# Patient Record
Sex: Female | Born: 1945
Health system: Southern US, Community
[De-identification: ages and names within clinical notes are randomized; demographics above are authoritative.]

## PROBLEM LIST (undated history)

## (undated) DIAGNOSIS — M4316 Spondylolisthesis, lumbar region: Secondary | ICD-10-CM

## (undated) DIAGNOSIS — R112 Nausea with vomiting, unspecified: Secondary | ICD-10-CM

## (undated) DIAGNOSIS — R7302 Impaired glucose tolerance (oral): Secondary | ICD-10-CM

## (undated) DIAGNOSIS — T8859XA Other complications of anesthesia, initial encounter: Secondary | ICD-10-CM

## (undated) DIAGNOSIS — D691 Qualitative platelet defects: Secondary | ICD-10-CM

## (undated) DIAGNOSIS — M1990 Unspecified osteoarthritis, unspecified site: Secondary | ICD-10-CM

## (undated) DIAGNOSIS — I10 Essential (primary) hypertension: Secondary | ICD-10-CM

## (undated) DIAGNOSIS — C9590 Leukemia, unspecified not having achieved remission: Secondary | ICD-10-CM

## (undated) DIAGNOSIS — IMO0002 Reserved for concepts with insufficient information to code with codable children: Secondary | ICD-10-CM

## (undated) DIAGNOSIS — K5730 Diverticulosis of large intestine without perforation or abscess without bleeding: Secondary | ICD-10-CM

## (undated) DIAGNOSIS — M542 Cervicalgia: Secondary | ICD-10-CM

## (undated) DIAGNOSIS — Z9889 Other specified postprocedural states: Secondary | ICD-10-CM

## (undated) DIAGNOSIS — K635 Polyp of colon: Secondary | ICD-10-CM

## (undated) DIAGNOSIS — T4145XA Adverse effect of unspecified anesthetic, initial encounter: Secondary | ICD-10-CM

## (undated) DIAGNOSIS — M545 Low back pain, unspecified: Secondary | ICD-10-CM

## (undated) DIAGNOSIS — I739 Peripheral vascular disease, unspecified: Secondary | ICD-10-CM

## (undated) HISTORY — DX: Low back pain, unspecified: M54.50

## (undated) HISTORY — DX: Diverticulosis of large intestine without perforation or abscess without bleeding: K57.30

## (undated) HISTORY — DX: Unspecified osteoarthritis, unspecified site: M19.90

## (undated) HISTORY — DX: Polyp of colon: K63.5

## (undated) HISTORY — DX: Essential (primary) hypertension: I10

## (undated) HISTORY — DX: Reserved for concepts with insufficient information to code with codable children: IMO0002

## (undated) HISTORY — DX: Impaired glucose tolerance (oral): R73.02

## (undated) HISTORY — PX: BREAST EXCISIONAL BIOPSY: SUR124

## (undated) HISTORY — DX: Peripheral vascular disease, unspecified: I73.9

## (undated) HISTORY — PX: BREAST SURGERY: SHX581

## (undated) HISTORY — DX: Low back pain: M54.5

## (undated) HISTORY — PX: BACK SURGERY: SHX140

## (undated) HISTORY — DX: Qualitative platelet defects: D69.1

---

## 1987-06-30 HISTORY — PX: ABDOMINAL HYSTERECTOMY: SHX81

## 1998-07-30 ENCOUNTER — Encounter: Payer: Self-pay | Admitting: Internal Medicine

## 1998-07-30 ENCOUNTER — Ambulatory Visit (HOSPITAL_COMMUNITY): Admission: RE | Admit: 1998-07-30 | Discharge: 1998-07-30 | Payer: Self-pay | Admitting: Internal Medicine

## 2001-01-11 ENCOUNTER — Other Ambulatory Visit: Admission: RE | Admit: 2001-01-11 | Discharge: 2001-01-11 | Payer: Self-pay | Admitting: Internal Medicine

## 2001-01-11 ENCOUNTER — Encounter (INDEPENDENT_AMBULATORY_CARE_PROVIDER_SITE_OTHER): Payer: Self-pay | Admitting: Specialist

## 2001-04-08 ENCOUNTER — Ambulatory Visit (HOSPITAL_COMMUNITY): Admission: RE | Admit: 2001-04-08 | Discharge: 2001-04-08 | Payer: Self-pay | Admitting: Internal Medicine

## 2001-04-08 ENCOUNTER — Encounter: Payer: Self-pay | Admitting: Internal Medicine

## 2001-07-29 ENCOUNTER — Encounter: Admission: RE | Admit: 2001-07-29 | Discharge: 2001-07-29 | Payer: Self-pay | Admitting: Internal Medicine

## 2001-07-29 ENCOUNTER — Encounter: Payer: Self-pay | Admitting: Internal Medicine

## 2002-07-31 ENCOUNTER — Encounter: Admission: RE | Admit: 2002-07-31 | Discharge: 2002-07-31 | Payer: Self-pay | Admitting: Internal Medicine

## 2002-07-31 ENCOUNTER — Encounter: Payer: Self-pay | Admitting: Internal Medicine

## 2004-04-27 ENCOUNTER — Encounter: Admission: RE | Admit: 2004-04-27 | Discharge: 2004-04-27 | Payer: Self-pay | Admitting: Rheumatology

## 2007-04-06 ENCOUNTER — Ambulatory Visit: Payer: Self-pay | Admitting: Internal Medicine

## 2007-04-06 ENCOUNTER — Encounter: Payer: Self-pay | Admitting: Internal Medicine

## 2007-04-06 DIAGNOSIS — M545 Low back pain, unspecified: Secondary | ICD-10-CM | POA: Insufficient documentation

## 2007-04-06 DIAGNOSIS — M1990 Unspecified osteoarthritis, unspecified site: Secondary | ICD-10-CM | POA: Insufficient documentation

## 2007-04-06 DIAGNOSIS — K5730 Diverticulosis of large intestine without perforation or abscess without bleeding: Secondary | ICD-10-CM | POA: Insufficient documentation

## 2007-04-06 DIAGNOSIS — Z86010 Personal history of colonic polyps: Secondary | ICD-10-CM | POA: Insufficient documentation

## 2007-04-06 DIAGNOSIS — I10 Essential (primary) hypertension: Secondary | ICD-10-CM | POA: Insufficient documentation

## 2007-04-06 DIAGNOSIS — F519 Sleep disorder not due to a substance or known physiological condition, unspecified: Secondary | ICD-10-CM | POA: Insufficient documentation

## 2007-04-06 DIAGNOSIS — J45909 Unspecified asthma, uncomplicated: Secondary | ICD-10-CM | POA: Insufficient documentation

## 2007-04-06 LAB — CONVERTED CEMR LAB
ALT: 16 units/L (ref 0–35)
Albumin: 4.1 g/dL (ref 3.5–5.2)
Basophils Absolute: 0 10*3/uL (ref 0.0–0.1)
Basophils Relative: 0.6 % (ref 0.0–1.0)
Bilirubin, Direct: 0.1 mg/dL (ref 0.0–0.3)
Chloride: 104 meq/L (ref 96–112)
Cholesterol: 127 mg/dL (ref 0–200)
Eosinophils Relative: 0.9 % (ref 0.0–5.0)
Glucose, Bld: 119 mg/dL — ABNORMAL HIGH (ref 70–99)
HCT: 35.6 % — ABNORMAL LOW (ref 36.0–46.0)
Hemoglobin, Urine: NEGATIVE
Hemoglobin: 12 g/dL (ref 12.0–15.0)
Monocytes Absolute: 0.4 10*3/uL (ref 0.2–0.7)
Neutrophils Relative %: 55.5 % (ref 43.0–77.0)
Platelets: 613 10*3/uL — ABNORMAL HIGH (ref 150–400)
RBC / HPF: NONE SEEN
RBC: 3.86 M/uL — ABNORMAL LOW (ref 3.87–5.11)
RDW: 11.5 % (ref 11.5–14.6)
Specific Gravity, Urine: >=1.03 (ref 1.000–1.03)
TSH: 0.55 microintl units/mL (ref 0.35–5.50)
Urine Glucose: NEGATIVE mg/dL
Urobilinogen, UA: 0.2 (ref 0.0–1.0)
VLDL: 36 mg/dL (ref 0–40)
WBC: 4.9 10*3/uL (ref 4.5–10.5)
pH: 6 (ref 5.0–8.0)

## 2007-05-10 ENCOUNTER — Ambulatory Visit: Payer: Self-pay | Admitting: Internal Medicine

## 2007-05-23 ENCOUNTER — Telehealth: Payer: Self-pay | Admitting: Internal Medicine

## 2007-05-24 ENCOUNTER — Ambulatory Visit: Payer: Self-pay | Admitting: Internal Medicine

## 2007-08-09 ENCOUNTER — Ambulatory Visit: Payer: Self-pay | Admitting: Internal Medicine

## 2007-08-09 DIAGNOSIS — IMO0002 Reserved for concepts with insufficient information to code with codable children: Secondary | ICD-10-CM | POA: Insufficient documentation

## 2007-08-09 DIAGNOSIS — J309 Allergic rhinitis, unspecified: Secondary | ICD-10-CM | POA: Insufficient documentation

## 2007-09-08 ENCOUNTER — Ambulatory Visit: Payer: Self-pay | Admitting: Endocrinology

## 2007-09-08 ENCOUNTER — Ambulatory Visit: Payer: Self-pay

## 2007-09-08 DIAGNOSIS — M79609 Pain in unspecified limb: Secondary | ICD-10-CM | POA: Insufficient documentation

## 2007-10-03 ENCOUNTER — Ambulatory Visit: Payer: Self-pay | Admitting: Internal Medicine

## 2007-10-03 ENCOUNTER — Encounter (INDEPENDENT_AMBULATORY_CARE_PROVIDER_SITE_OTHER): Payer: Self-pay | Admitting: *Deleted

## 2007-10-03 DIAGNOSIS — M25569 Pain in unspecified knee: Secondary | ICD-10-CM | POA: Insufficient documentation

## 2007-10-04 LAB — CONVERTED CEMR LAB
BUN: 11 mg/dL (ref 6–23)
Basophils Absolute: 0 10*3/uL (ref 0.0–0.1)
Basophils Relative: 0.1 % (ref 0.0–1.0)
CO2: 32 meq/L (ref 19–32)
Chloride: 108 meq/L (ref 96–112)
Creatinine, Ser: 0.8 mg/dL (ref 0.4–1.2)
Eosinophils Relative: 0.9 % (ref 0.0–5.0)
GFR calc Af Amer: 94 mL/min
GFR calc non Af Amer: 78 mL/min
HCT: 36.1 % (ref 36.0–46.0)
Hemoglobin: 11.8 g/dL — ABNORMAL LOW (ref 12.0–15.0)
Lymphocytes Relative: 37.8 % (ref 12.0–46.0)
MCHC: 32.6 g/dL (ref 30.0–36.0)
MCV: 94.5 fL (ref 78.0–100.0)
Potassium: 4 meq/L (ref 3.5–5.1)
RBC: 3.82 M/uL — ABNORMAL LOW (ref 3.87–5.11)
Sodium: 144 meq/L (ref 135–145)
TSH: 0.46 microintl units/mL (ref 0.35–5.50)

## 2007-10-21 ENCOUNTER — Encounter: Admission: RE | Admit: 2007-10-21 | Discharge: 2007-10-21 | Payer: Self-pay | Admitting: Internal Medicine

## 2007-10-25 ENCOUNTER — Encounter: Payer: Self-pay | Admitting: Internal Medicine

## 2007-11-02 ENCOUNTER — Encounter: Admission: RE | Admit: 2007-11-02 | Discharge: 2007-11-02 | Payer: Self-pay | Admitting: General Practice

## 2008-10-22 ENCOUNTER — Encounter: Admission: RE | Admit: 2008-10-22 | Discharge: 2008-10-22 | Payer: Self-pay | Admitting: Internal Medicine

## 2008-11-21 ENCOUNTER — Telehealth: Payer: Self-pay | Admitting: Internal Medicine

## 2008-11-29 ENCOUNTER — Ambulatory Visit: Payer: Self-pay | Admitting: Internal Medicine

## 2009-01-02 ENCOUNTER — Telehealth: Payer: Self-pay | Admitting: Internal Medicine

## 2009-02-05 ENCOUNTER — Telehealth: Payer: Self-pay | Admitting: Internal Medicine

## 2009-02-05 ENCOUNTER — Emergency Department (HOSPITAL_COMMUNITY): Admission: EM | Admit: 2009-02-05 | Discharge: 2009-02-05 | Payer: Self-pay | Admitting: Emergency Medicine

## 2009-06-05 ENCOUNTER — Ambulatory Visit: Payer: Self-pay | Admitting: Internal Medicine

## 2009-06-05 LAB — CONVERTED CEMR LAB
ALT: 6 units/L (ref 0–35)
Basophils Absolute: 0 10*3/uL (ref 0.0–0.1)
Bilirubin Urine: NEGATIVE
Bilirubin, Direct: 0.1 mg/dL (ref 0.0–0.3)
Cholesterol: 147 mg/dL (ref 0–200)
Creatinine, Ser: 0.9 mg/dL (ref 0.4–1.2)
Eosinophils Relative: 1.4 % (ref 0.0–5.0)
HCT: 36.1 % (ref 36.0–46.0)
HDL: 33.7 mg/dL — ABNORMAL LOW (ref >39.00)
Hemoglobin, Urine: NEGATIVE
Hemoglobin: 11.9 g/dL — ABNORMAL LOW (ref 12.0–15.0)
Ketones, ur: NEGATIVE mg/dL
Leukocytes, UA: NEGATIVE
Lymphocytes Relative: 39.8 % (ref 12.0–46.0)
Lymphs Abs: 2.3 10*3/uL (ref 0.7–4.0)
MCHC: 33 g/dL (ref 30.0–36.0)
MCV: 94.7 fL (ref 78.0–100.0)
Neutrophils Relative %: 51.6 % (ref 43.0–77.0)
RBC: 3.82 M/uL — ABNORMAL LOW (ref 3.87–5.11)
Specific Gravity, Urine: <=1.005 (ref 1.000–1.030)
TSH: 1.2 microintl units/mL (ref 0.35–5.50)
Total Protein, Urine: NEGATIVE mg/dL
Urobilinogen, UA: 0.2 (ref 0.0–1.0)
VLDL: 21.6 mg/dL (ref 0.0–40.0)
pH: 5 (ref 5.0–8.0)

## 2009-06-06 ENCOUNTER — Ambulatory Visit (HOSPITAL_COMMUNITY): Admission: RE | Admit: 2009-06-06 | Discharge: 2009-06-06 | Payer: Self-pay | Admitting: Ophthalmology

## 2009-06-06 ENCOUNTER — Ambulatory Visit: Payer: Self-pay | Admitting: Vascular Surgery

## 2009-06-06 ENCOUNTER — Encounter (INDEPENDENT_AMBULATORY_CARE_PROVIDER_SITE_OTHER): Payer: Self-pay | Admitting: Ophthalmology

## 2009-06-07 ENCOUNTER — Ambulatory Visit: Payer: Self-pay | Admitting: Internal Medicine

## 2009-06-07 DIAGNOSIS — H5310 Unspecified subjective visual disturbances: Secondary | ICD-10-CM | POA: Insufficient documentation

## 2009-06-07 DIAGNOSIS — D759 Disease of blood and blood-forming organs, unspecified: Secondary | ICD-10-CM | POA: Insufficient documentation

## 2009-06-19 ENCOUNTER — Telehealth: Payer: Self-pay | Admitting: Internal Medicine

## 2009-06-29 DIAGNOSIS — D691 Qualitative platelet defects: Secondary | ICD-10-CM

## 2009-06-29 DIAGNOSIS — I739 Peripheral vascular disease, unspecified: Secondary | ICD-10-CM

## 2009-06-29 DIAGNOSIS — IMO0002 Reserved for concepts with insufficient information to code with codable children: Secondary | ICD-10-CM

## 2009-06-29 HISTORY — DX: Peripheral vascular disease, unspecified: I73.9

## 2009-06-29 HISTORY — DX: Qualitative platelet defects: D69.1

## 2009-06-29 HISTORY — DX: Reserved for concepts with insufficient information to code with codable children: IMO0002

## 2009-07-18 ENCOUNTER — Ambulatory Visit: Payer: Self-pay | Admitting: Vascular Surgery

## 2009-07-18 ENCOUNTER — Encounter: Payer: Self-pay | Admitting: Internal Medicine

## 2009-08-05 ENCOUNTER — Ambulatory Visit: Payer: Self-pay | Admitting: Vascular Surgery

## 2009-08-05 ENCOUNTER — Ambulatory Visit (HOSPITAL_COMMUNITY): Admission: RE | Admit: 2009-08-05 | Discharge: 2009-08-05 | Payer: Self-pay | Admitting: Vascular Surgery

## 2009-08-06 ENCOUNTER — Encounter: Payer: Self-pay | Admitting: Internal Medicine

## 2009-08-14 ENCOUNTER — Ambulatory Visit: Payer: Self-pay | Admitting: Internal Medicine

## 2009-08-15 LAB — CONVERTED CEMR LAB
Basophils Absolute: 0 10*3/uL (ref 0.0–0.1)
CO2: 33 meq/L — ABNORMAL HIGH (ref 19–32)
Chloride: 105 meq/L (ref 96–112)
Eosinophils Absolute: 0.1 10*3/uL (ref 0.0–0.7)
GFR calc non Af Amer: 92.92 mL/min (ref >60)
Lymphs Abs: 1.6 10*3/uL (ref 0.7–4.0)
Monocytes Relative: 9.1 % (ref 3.0–12.0)
Neutrophils Relative %: 55.1 % (ref 43.0–77.0)
Platelets: 638 10*3/uL — ABNORMAL HIGH (ref 150.0–400.0)
RBC: 3.57 M/uL — ABNORMAL LOW (ref 3.87–5.11)
Sodium: 144 meq/L (ref 135–145)
WBC: 4.9 10*3/uL (ref 4.5–10.5)

## 2009-08-21 ENCOUNTER — Ambulatory Visit: Payer: Self-pay | Admitting: Internal Medicine

## 2009-08-21 DIAGNOSIS — I739 Peripheral vascular disease, unspecified: Secondary | ICD-10-CM | POA: Insufficient documentation

## 2009-08-21 DIAGNOSIS — H3582 Retinal ischemia: Secondary | ICD-10-CM | POA: Insufficient documentation

## 2009-08-21 DIAGNOSIS — R609 Edema, unspecified: Secondary | ICD-10-CM | POA: Insufficient documentation

## 2009-08-22 ENCOUNTER — Ambulatory Visit: Payer: Self-pay | Admitting: Oncology

## 2009-08-26 ENCOUNTER — Encounter: Payer: Self-pay | Admitting: Internal Medicine

## 2009-08-26 LAB — CBC WITH DIFFERENTIAL/PLATELET
Basophils Absolute: 0 10*3/uL (ref 0.0–0.1)
Eosinophils Absolute: 0.1 10*3/uL (ref 0.0–0.5)
HGB: 12.2 g/dL (ref 11.6–15.9)
MCH: 30.3 pg (ref 25.1–34.0)
MCHC: 33.8 g/dL (ref 31.5–36.0)
NEUT#: 2.5 10*3/uL (ref 1.5–6.5)
Platelets: 585 10*3/uL — ABNORMAL HIGH (ref 145–400)
RBC: 4.02 10*6/uL (ref 3.70–5.45)
WBC: 5.1 10*3/uL (ref 3.9–10.3)
nRBC: 0 % (ref 0–0)

## 2009-08-26 LAB — CHCC SMEAR

## 2009-08-26 LAB — MORPHOLOGY

## 2009-09-02 LAB — IRON AND TIBC
Iron: 78 ug/dL (ref 42–145)
TIBC: 346 ug/dL (ref 250–470)

## 2009-09-02 LAB — COMPREHENSIVE METABOLIC PANEL
ALT: <8 U/L (ref 0–35)
Albumin: 4.5 g/dL (ref 3.5–5.2)
BUN: 16 mg/dL (ref 6–23)
CO2: 27 mEq/L (ref 19–32)
Calcium: 9.8 mg/dL (ref 8.4–10.5)
Chloride: 100 mEq/L (ref 96–112)
Creatinine, Ser: 0.91 mg/dL (ref 0.40–1.20)
Sodium: 140 mEq/L (ref 135–145)

## 2009-09-02 LAB — SEDIMENTATION RATE: Sed Rate: 11 mm/hr (ref 0–22)

## 2009-09-02 LAB — TRANSFERRIN RECEPTOR, SOLUABLE: Transferrin Receptor, Soluble: 18.8 nmol/L

## 2009-09-02 LAB — FERRITIN: Ferritin: 224 ng/mL (ref 10–291)

## 2009-09-02 LAB — C-REACTIVE PROTEIN: CRP: 0.3 mg/dL (ref <0.6)

## 2009-09-24 ENCOUNTER — Encounter: Payer: Self-pay | Admitting: Internal Medicine

## 2009-09-24 ENCOUNTER — Ambulatory Visit: Payer: Self-pay | Admitting: Oncology

## 2009-09-24 LAB — CBC WITH DIFFERENTIAL/PLATELET
Basophils Absolute: 0 10*3/uL (ref 0.0–0.1)
EOS%: 4.7 % (ref 0.0–7.0)
Eosinophils Absolute: 0.2 10*3/uL (ref 0.0–0.5)
HCT: 36.1 % (ref 34.8–46.6)
MONO%: 7.3 % (ref 0.0–14.0)
NEUT#: 2.4 10*3/uL (ref 1.5–6.5)
NEUT%: 51.1 % (ref 38.4–76.8)
lymph#: 1.7 10*3/uL (ref 0.9–3.3)

## 2009-09-24 LAB — C-REACTIVE PROTEIN: CRP: 0.2 mg/dL (ref <0.6)

## 2009-10-04 ENCOUNTER — Ambulatory Visit (HOSPITAL_COMMUNITY): Admission: RE | Admit: 2009-10-04 | Discharge: 2009-10-04 | Payer: Self-pay | Admitting: Oncology

## 2009-10-11 LAB — CBC WITH DIFFERENTIAL/PLATELET
BASO%: 0.8 % (ref 0.0–2.0)
Basophils Absolute: 0 10e3/uL (ref 0.0–0.1)
EOS%: 4 % (ref 0.0–7.0)
Eosinophils Absolute: 0.2 10e3/uL (ref 0.0–0.5)
HCT: 35.1 % (ref 34.8–46.6)
HGB: 11.9 g/dL (ref 11.6–15.9)
LYMPH%: 37.8 % (ref 14.0–49.7)
MCH: 32 pg (ref 25.1–34.0)
MCHC: 33.9 g/dL (ref 31.5–36.0)
MCV: 94.5 fL (ref 79.5–101.0)
MONO#: 0.5 10e3/uL (ref 0.1–0.9)
MONO%: 8.8 % (ref 0.0–14.0)
NEUT#: 2.7 10e3/uL (ref 1.5–6.5)
NEUT%: 48.6 % (ref 38.4–76.8)
Platelets: 650 10e3/uL — ABNORMAL HIGH (ref 145–400)
RBC: 3.71 10e6/uL (ref 3.70–5.45)
RDW: 12.9 % (ref 11.2–14.5)
WBC: 5.5 10e3/uL (ref 3.9–10.3)
lymph#: 2.1 10e3/uL (ref 0.9–3.3)

## 2009-10-21 LAB — JAK2 EXONS 12-15

## 2009-10-23 ENCOUNTER — Encounter: Admission: RE | Admit: 2009-10-23 | Discharge: 2009-10-23 | Payer: Self-pay | Admitting: Internal Medicine

## 2009-10-23 LAB — HM MAMMOGRAPHY: HM Mammogram: NEGATIVE

## 2009-11-11 ENCOUNTER — Ambulatory Visit: Payer: Self-pay | Admitting: Oncology

## 2009-11-13 ENCOUNTER — Ambulatory Visit: Payer: Self-pay | Admitting: Oncology

## 2009-11-14 ENCOUNTER — Ambulatory Visit (HOSPITAL_COMMUNITY): Admission: RE | Admit: 2009-11-14 | Discharge: 2009-11-14 | Payer: Self-pay | Admitting: Oncology

## 2009-11-22 ENCOUNTER — Encounter: Payer: Self-pay | Admitting: Internal Medicine

## 2009-11-22 LAB — CBC WITH DIFFERENTIAL/PLATELET
BASO%: 0.4 % (ref 0.0–2.0)
Basophils Absolute: 0 10*3/uL (ref 0.0–0.1)
EOS%: 3.6 % (ref 0.0–7.0)
HGB: 11.9 g/dL (ref 11.6–15.9)
MONO%: 8.6 % (ref 0.0–14.0)
lymph#: 1.8 10*3/uL (ref 0.9–3.3)

## 2009-11-22 LAB — COMPREHENSIVE METABOLIC PANEL
ALT: <8 U/L (ref 0–35)
AST: 17 U/L (ref 0–37)
CO2: 26 mEq/L (ref 19–32)
Chloride: 102 mEq/L (ref 96–112)
Glucose, Bld: 102 mg/dL — ABNORMAL HIGH (ref 70–99)

## 2009-11-22 LAB — URIC ACID: Uric Acid, Serum: 7 mg/dL (ref 2.4–7.0)

## 2009-11-22 LAB — LACTATE DEHYDROGENASE: LDH: 165 U/L (ref 94–250)

## 2009-12-06 LAB — CBC WITH DIFFERENTIAL/PLATELET
Eosinophils Absolute: 0.2 10*3/uL (ref 0.0–0.5)
HCT: 35.9 % (ref 34.8–46.6)
HGB: 12.1 g/dL (ref 11.6–15.9)
MCHC: 33.6 g/dL (ref 31.5–36.0)
MONO%: 7.7 % (ref 0.0–14.0)
RDW: 13 % (ref 11.2–14.5)
WBC: 5.2 10*3/uL (ref 3.9–10.3)

## 2009-12-16 ENCOUNTER — Ambulatory Visit: Payer: Self-pay | Admitting: Internal Medicine

## 2009-12-16 LAB — CONVERTED CEMR LAB
Alkaline Phosphatase: 55 units/L (ref 39–117)
BUN: 14 mg/dL (ref 6–23)
Basophils Absolute: 0 10*3/uL (ref 0.0–0.1)
Basophils Relative: 0.5 % (ref 0.0–3.0)
Bilirubin, Direct: 0.1 mg/dL (ref 0.0–0.3)
Calcium: 9.7 mg/dL (ref 8.4–10.5)
Creatinine, Ser: 0.8 mg/dL (ref 0.4–1.2)
Eosinophils Absolute: 0.1 10*3/uL (ref 0.0–0.7)
Eosinophils Relative: 2.5 % (ref 0.0–5.0)
GFR calc non Af Amer: 92.82 mL/min (ref >60)
Glucose, Bld: 127 mg/dL — ABNORMAL HIGH (ref 70–99)
HCT: 34.2 % — ABNORMAL LOW (ref 36.0–46.0)
Hemoglobin: 11.8 g/dL — ABNORMAL LOW (ref 12.0–15.0)
Hgb A1c MFr Bld: 6.2 % (ref 4.6–6.5)
Lymphocytes Relative: 33.1 % (ref 12.0–46.0)
Lymphs Abs: 1.7 10*3/uL (ref 0.7–4.0)
Monocytes Relative: 8.1 % (ref 3.0–12.0)
Neutro Abs: 2.8 10*3/uL (ref 1.4–7.7)
Neutrophils Relative %: 55.8 % (ref 43.0–77.0)
Potassium: 4.5 meq/L (ref 3.5–5.1)
RDW: 13.2 % (ref 11.5–14.6)
Sodium: 141 meq/L (ref 135–145)
Total Bilirubin: 0.7 mg/dL (ref 0.3–1.2)

## 2009-12-18 ENCOUNTER — Ambulatory Visit: Payer: Self-pay | Admitting: Oncology

## 2009-12-20 ENCOUNTER — Ambulatory Visit: Payer: Self-pay | Admitting: Internal Medicine

## 2009-12-20 DIAGNOSIS — R21 Rash and other nonspecific skin eruption: Secondary | ICD-10-CM | POA: Insufficient documentation

## 2009-12-20 DIAGNOSIS — R42 Dizziness and giddiness: Secondary | ICD-10-CM | POA: Insufficient documentation

## 2009-12-20 LAB — CBC WITH DIFFERENTIAL/PLATELET
Basophils Absolute: 0 10*3/uL (ref 0.0–0.1)
EOS%: 2.2 % (ref 0.0–7.0)
Eosinophils Absolute: 0.1 10*3/uL (ref 0.0–0.5)
HCT: 34.1 % — ABNORMAL LOW (ref 34.8–46.6)
HGB: 11.6 g/dL (ref 11.6–15.9)
LYMPH%: 35 % (ref 14.0–49.7)
MCH: 32.1 pg (ref 25.1–34.0)
MCHC: 34.1 g/dL (ref 31.5–36.0)
MCV: 94 fL (ref 79.5–101.0)
MONO#: 0.4 10*3/uL (ref 0.1–0.9)
NEUT%: 54.8 % (ref 38.4–76.8)
Platelets: 639 10*3/uL — ABNORMAL HIGH (ref 145–400)
RBC: 3.62 10*6/uL — ABNORMAL LOW (ref 3.70–5.45)
RDW: 13.7 % (ref 11.2–14.5)

## 2010-01-02 ENCOUNTER — Encounter: Payer: Self-pay | Admitting: Internal Medicine

## 2010-01-02 LAB — CBC WITH DIFFERENTIAL/PLATELET
MCH: 32.9 pg (ref 25.1–34.0)
MCV: 96.6 fL (ref 79.5–101.0)
NEUT#: 2.3 10*3/uL (ref 1.5–6.5)
NEUT%: 50 % (ref 38.4–76.8)
Platelets: 614 10*3/uL — ABNORMAL HIGH (ref 145–400)
RBC: 3.58 10*6/uL — ABNORMAL LOW (ref 3.70–5.45)
WBC: 4.5 10*3/uL (ref 3.9–10.3)
lymph#: 1.8 10*3/uL (ref 0.9–3.3)

## 2010-01-02 LAB — LACTATE DEHYDROGENASE: LDH: 163 U/L (ref 94–250)

## 2010-01-02 LAB — COMPREHENSIVE METABOLIC PANEL
AST: 17 U/L (ref 0–37)
Albumin: 4.5 g/dL (ref 3.5–5.2)
BUN: 12 mg/dL (ref 6–23)
Chloride: 102 mEq/L (ref 96–112)
Glucose, Bld: 117 mg/dL — ABNORMAL HIGH (ref 70–99)
Potassium: 4.7 mEq/L (ref 3.5–5.3)

## 2010-01-16 LAB — CBC WITH DIFFERENTIAL/PLATELET
EOS%: 1.5 % (ref 0.0–7.0)
Eosinophils Absolute: 0.1 10*3/uL (ref 0.0–0.5)
HCT: 33 % — ABNORMAL LOW (ref 34.8–46.6)
HGB: 11.3 g/dL — ABNORMAL LOW (ref 11.6–15.9)
LYMPH%: 39.4 % (ref 14.0–49.7)
MCHC: 34.2 g/dL (ref 31.5–36.0)
MONO#: 0.6 10*3/uL (ref 0.1–0.9)
RDW: 16.2 % — ABNORMAL HIGH (ref 11.2–14.5)
lymph#: 1.8 10*3/uL (ref 0.9–3.3)
nRBC: 0 % (ref 0–0)

## 2010-01-28 ENCOUNTER — Ambulatory Visit: Payer: Self-pay | Admitting: Oncology

## 2010-01-30 LAB — CBC WITH DIFFERENTIAL/PLATELET
BASO%: 0.7 % (ref 0.0–2.0)
EOS%: 1.1 % (ref 0.0–7.0)
HCT: 32.8 % — ABNORMAL LOW (ref 34.8–46.6)
HGB: 11.2 g/dL — ABNORMAL LOW (ref 11.6–15.9)
MCH: 33.3 pg (ref 25.1–34.0)
MCHC: 34.1 g/dL (ref 31.5–36.0)
MONO#: 0.4 10*3/uL (ref 0.1–0.9)
RBC: 3.36 10*6/uL — ABNORMAL LOW (ref 3.70–5.45)
RDW: 17 % — ABNORMAL HIGH (ref 11.2–14.5)
WBC: 4.5 10*3/uL (ref 3.9–10.3)

## 2010-02-13 LAB — CBC WITH DIFFERENTIAL/PLATELET
BASO%: 0.5 % (ref 0.0–2.0)
EOS%: 1 % (ref 0.0–7.0)
Eosinophils Absolute: 0 10*3/uL (ref 0.0–0.5)
HCT: 32 % — ABNORMAL LOW (ref 34.8–46.6)
LYMPH%: 30.9 % (ref 14.0–49.7)
MCV: 103.8 fL — ABNORMAL HIGH (ref 79.5–101.0)
MONO#: 0.4 10*3/uL (ref 0.1–0.9)
NEUT#: 2.3 10*3/uL (ref 1.5–6.5)
WBC: 3.9 10*3/uL (ref 3.9–10.3)

## 2010-02-27 ENCOUNTER — Ambulatory Visit: Payer: Self-pay | Admitting: Oncology

## 2010-02-27 ENCOUNTER — Encounter: Payer: Self-pay | Admitting: Internal Medicine

## 2010-02-27 LAB — LACTATE DEHYDROGENASE: LDH: 144 U/L (ref 94–250)

## 2010-02-27 LAB — CBC WITH DIFFERENTIAL/PLATELET
BASO%: 0.4 % (ref 0.0–2.0)
LYMPH%: 39.4 % (ref 14.0–49.7)
MCH: 36.3 pg — ABNORMAL HIGH (ref 25.1–34.0)
MCV: 106.8 fL — ABNORMAL HIGH (ref 79.5–101.0)
MONO#: 0.3 10*3/uL (ref 0.1–0.9)
MONO%: 8.2 % (ref 0.0–14.0)
NEUT#: 2.1 10*3/uL (ref 1.5–6.5)
Platelets: 443 10*3/uL — ABNORMAL HIGH (ref 145–400)
RBC: 3.09 10*6/uL — ABNORMAL LOW (ref 3.70–5.45)

## 2010-02-27 LAB — COMPREHENSIVE METABOLIC PANEL
ALT: 8 U/L (ref 0–35)
Alkaline Phosphatase: 48 U/L (ref 39–117)
BUN: 11 mg/dL (ref 6–23)
Calcium: 9.2 mg/dL (ref 8.4–10.5)
Chloride: 100 mEq/L (ref 96–112)
Creatinine, Ser: 0.81 mg/dL (ref 0.40–1.20)
Glucose, Bld: 114 mg/dL — ABNORMAL HIGH (ref 70–99)
Potassium: 3.3 mEq/L — ABNORMAL LOW (ref 3.5–5.3)
Sodium: 137 mEq/L (ref 135–145)

## 2010-03-20 LAB — CBC WITH DIFFERENTIAL/PLATELET
BASO%: 0.2 % (ref 0.0–2.0)
HCT: 32 % — ABNORMAL LOW (ref 34.8–46.6)
LYMPH%: 35.7 % (ref 14.0–49.7)
MCV: 111.9 fL — ABNORMAL HIGH (ref 79.5–101.0)
MONO#: 0.5 10*3/uL (ref 0.1–0.9)
MONO%: 10 % (ref 0.0–14.0)
NEUT#: 2.5 10*3/uL (ref 1.5–6.5)
NEUT%: 53.4 % (ref 38.4–76.8)
RDW: 16.7 % — ABNORMAL HIGH (ref 11.2–14.5)
WBC: 4.7 10*3/uL (ref 3.9–10.3)

## 2010-04-08 ENCOUNTER — Ambulatory Visit: Payer: Self-pay | Admitting: Oncology

## 2010-04-09 ENCOUNTER — Ambulatory Visit: Payer: Self-pay | Admitting: Internal Medicine

## 2010-04-10 LAB — CBC WITH DIFFERENTIAL/PLATELET
EOS%: 1.2 % (ref 0.0–7.0)
LYMPH%: 37.7 % (ref 14.0–49.7)
MCV: 106.9 fL — ABNORMAL HIGH (ref 79.5–101.0)
MONO#: 0.4 10*3/uL (ref 0.1–0.9)
NEUT#: 2.2 10*3/uL (ref 1.5–6.5)
NEUT%: 51.5 % (ref 38.4–76.8)
RDW: 14.1 % (ref 11.2–14.5)
WBC: 4.3 10*3/uL (ref 3.9–10.3)
lymph#: 1.6 10*3/uL (ref 0.9–3.3)

## 2010-05-01 LAB — CBC WITH DIFFERENTIAL/PLATELET
Basophils Absolute: 0 10*3/uL (ref 0.0–0.1)
Eosinophils Absolute: 0 10*3/uL (ref 0.0–0.5)
HGB: 11.3 g/dL — ABNORMAL LOW (ref 11.6–15.9)
MCV: 114.7 fL — ABNORMAL HIGH (ref 79.5–101.0)
NEUT#: 2.5 10*3/uL (ref 1.5–6.5)
RDW: 13.1 % (ref 11.2–14.5)
lymph#: 1.8 10*3/uL (ref 0.9–3.3)

## 2010-05-21 ENCOUNTER — Ambulatory Visit: Payer: Self-pay | Admitting: Oncology

## 2010-05-26 ENCOUNTER — Encounter: Payer: Self-pay | Admitting: Internal Medicine

## 2010-05-26 LAB — COMPREHENSIVE METABOLIC PANEL
CO2: 28 mEq/L (ref 19–32)
Creatinine, Ser: 0.87 mg/dL (ref 0.40–1.20)
Glucose, Bld: 114 mg/dL — ABNORMAL HIGH (ref 70–99)
Total Bilirubin: 0.2 mg/dL — ABNORMAL LOW (ref 0.3–1.2)
Total Protein: 7.4 g/dL (ref 6.0–8.3)

## 2010-05-26 LAB — CBC WITH DIFFERENTIAL/PLATELET
Basophils Absolute: 0 10*3/uL (ref 0.0–0.1)
Eosinophils Absolute: 0.1 10*3/uL (ref 0.0–0.5)
HCT: 32.3 % — ABNORMAL LOW (ref 34.8–46.6)
HGB: 11.3 g/dL — ABNORMAL LOW (ref 11.6–15.9)
LYMPH%: 43.1 % (ref 14.0–49.7)
MCV: 115.5 fL — ABNORMAL HIGH (ref 79.5–101.0)
MONO%: 9.4 % (ref 0.0–14.0)
NEUT#: 2.1 10*3/uL (ref 1.5–6.5)
Platelets: 424 10*3/uL — ABNORMAL HIGH (ref 145–400)

## 2010-06-18 ENCOUNTER — Ambulatory Visit: Payer: Self-pay | Admitting: Vascular Surgery

## 2010-06-24 ENCOUNTER — Ambulatory Visit: Payer: Self-pay | Admitting: Oncology

## 2010-06-24 LAB — CBC WITH DIFFERENTIAL/PLATELET
Basophils Absolute: 0 10*3/uL (ref 0.0–0.1)
EOS%: 1 % (ref 0.0–7.0)
HGB: 12.2 g/dL (ref 11.6–15.9)
MCH: 41 pg — ABNORMAL HIGH (ref 25.1–34.0)
NEUT#: 2.1 10*3/uL (ref 1.5–6.5)
RDW: 13.6 % (ref 11.2–14.5)
WBC: 4.9 10*3/uL (ref 3.9–10.3)
lymph#: 2.4 10*3/uL (ref 0.9–3.3)

## 2010-06-26 IMAGING — US US ABDOMEN LIMITED
1 series · 8 of 8 positions shown · non-contrast
Comparison: None.

CLINICAL DATA: Thrombocytopenia evaluate for splenic enlargement

LIMITED ABDOMINAL ULTRASOUND - RIGHT UPPER QUADRANT

[Series 1: us abdomen limited · 0.19mm/px · 8 of 8 slices shown]
[im 1/8]
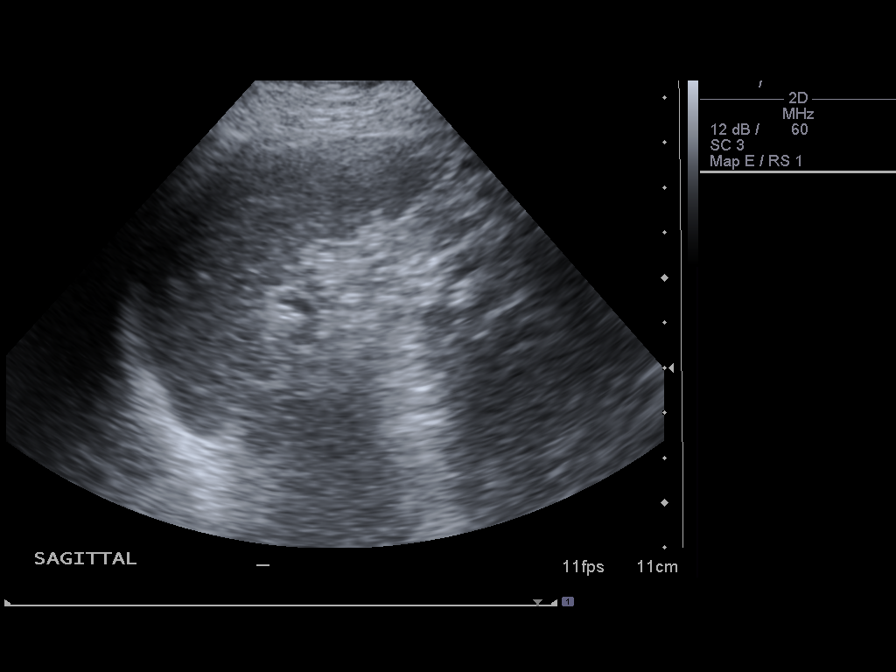
[im 2/8]
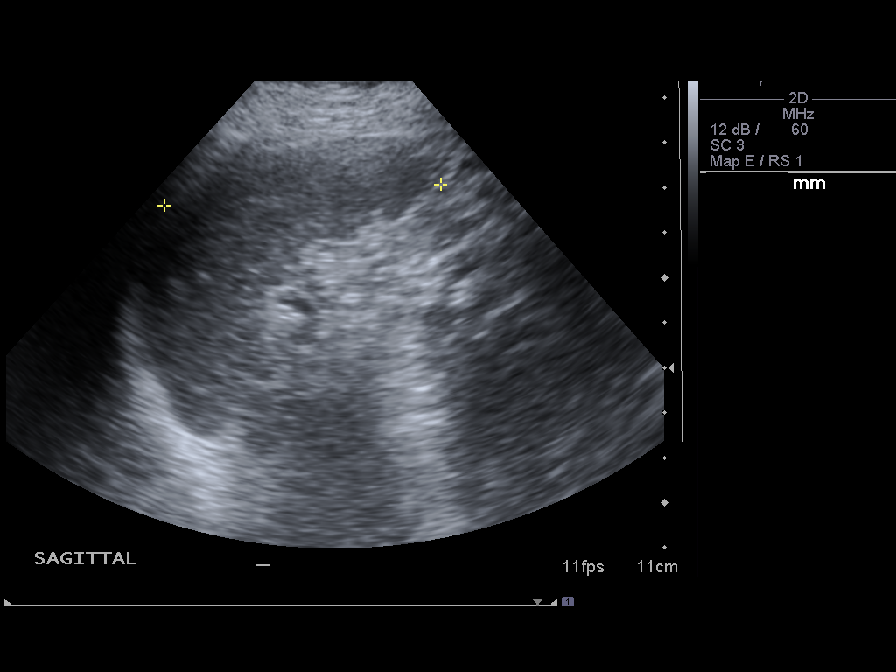
[im 3/8]
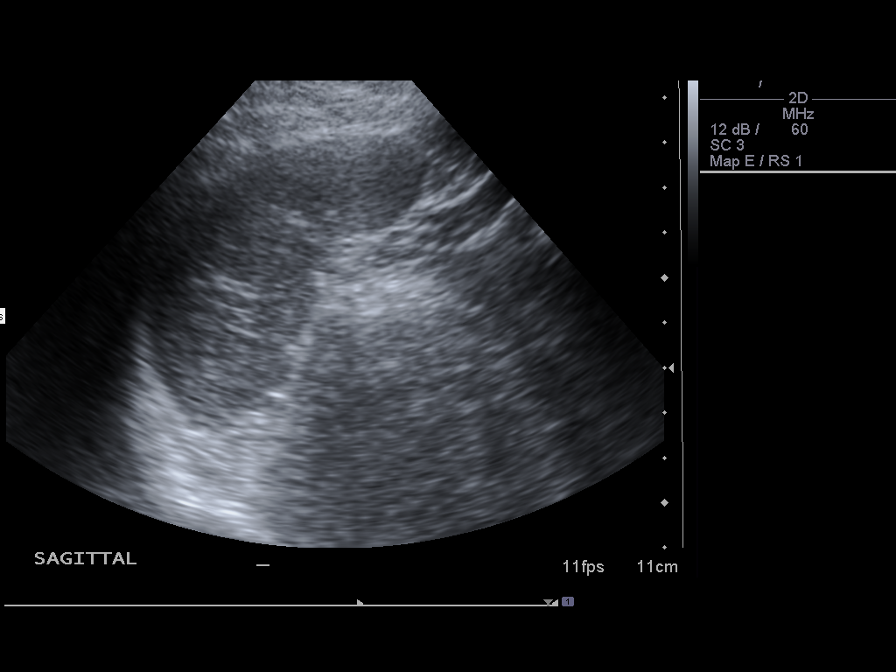
[im 4/8]
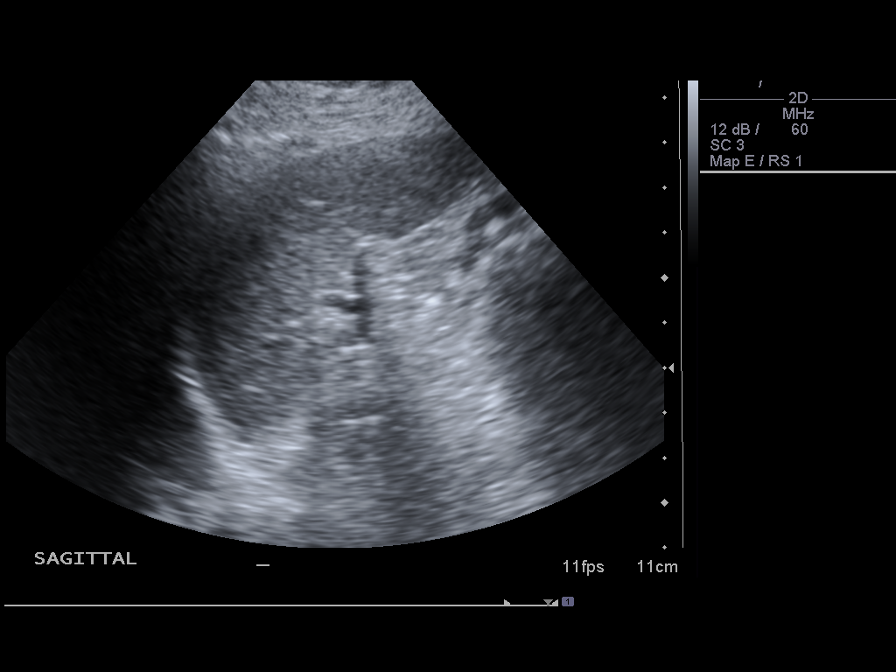
[im 5/8]
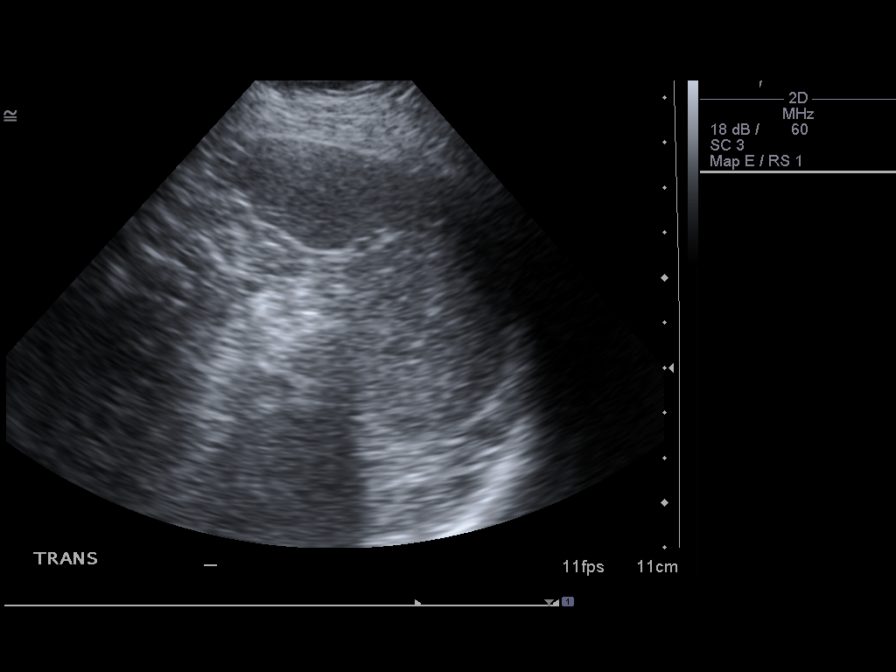
[im 6/8]
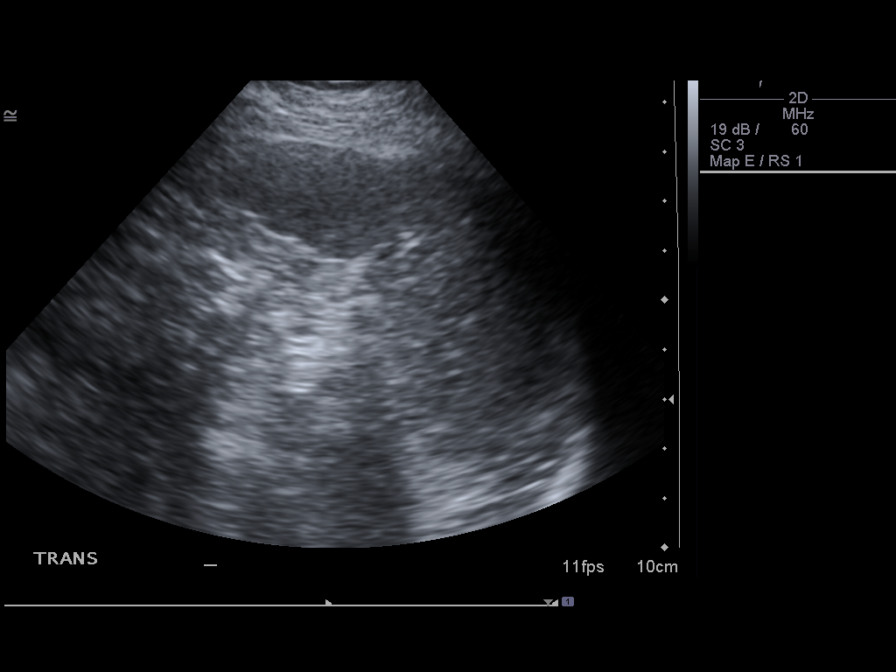
[im 7/8]
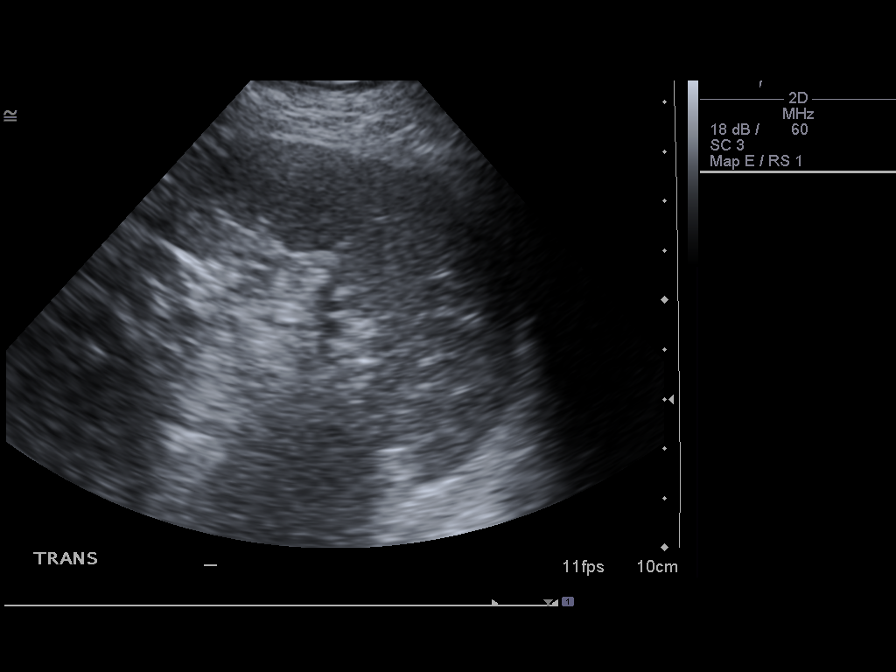
[im 8/8]
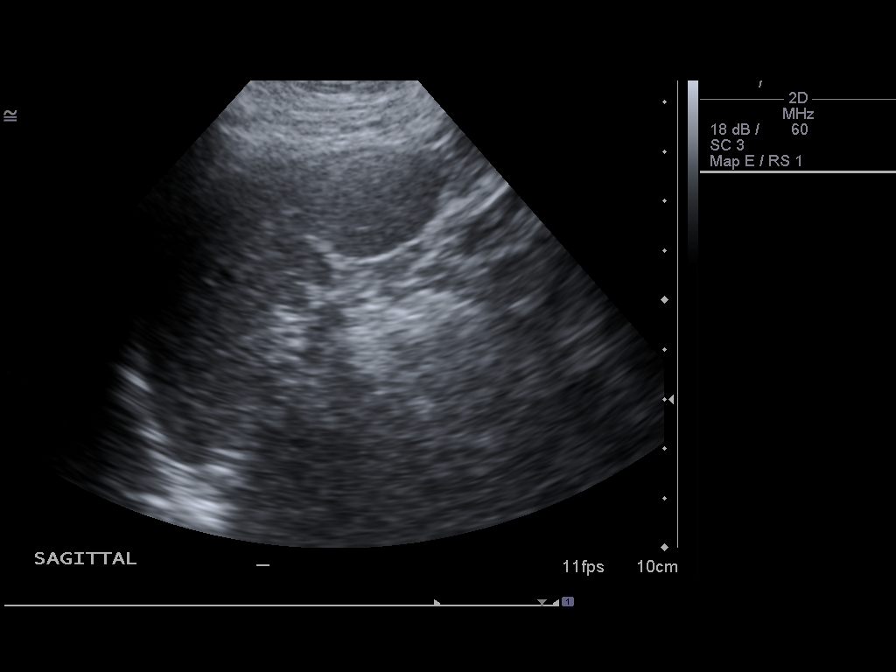

[8 of 8 positions shown; findings below may reference images not displayed]

FINDINGS: Splenic size is within normal limits.  Length of 6.2 cm.  Normal
echogenicity.  No focal splenic abnormality.  No perisplenic free
fluid or ascites.
IMPRESSION: Normal splenic size.

## 2010-07-08 LAB — CBC WITH DIFFERENTIAL/PLATELET
BASO%: 0.4 % (ref 0.0–2.0)
Basophils Absolute: 0 10*3/uL (ref 0.0–0.1)
EOS%: 1.2 % (ref 0.0–7.0)
Eosinophils Absolute: 0.1 10*3/uL (ref 0.0–0.5)
HCT: 30.9 % — ABNORMAL LOW (ref 34.8–46.6)
HGB: 10.6 g/dL — ABNORMAL LOW (ref 11.6–15.9)
LYMPH%: 37.3 % (ref 14.0–49.7)
MCH: 40.5 pg — ABNORMAL HIGH (ref 25.1–34.0)
MCHC: 34.2 g/dL (ref 31.5–36.0)
MCV: 118.5 fL — ABNORMAL HIGH (ref 79.5–101.0)
MONO#: 0.5 10*3/uL (ref 0.1–0.9)
MONO%: 11.3 % (ref 0.0–14.0)
NEUT#: 2.4 10*3/uL (ref 1.5–6.5)
NEUT%: 49.8 % (ref 38.4–76.8)
Platelets: 361 10*3/uL (ref 145–400)
RBC: 2.6 10*6/uL — ABNORMAL LOW (ref 3.70–5.45)
RDW: 14.4 % (ref 11.2–14.5)
WBC: 4.7 10*3/uL (ref 3.9–10.3)
lymph#: 1.8 10*3/uL (ref 0.9–3.3)

## 2010-07-29 NOTE — Letter (Signed)
Summary: [facility]   Imported By: Lester [place] 08/06/2009 10:06:58  _____________________________________________________________________  External Attachment:    Type:   Image     Comment:   External Document

## 2010-07-29 NOTE — Assessment & Plan Note (Signed)
Summary: 4 mo rov /nws  #   Vital Signs:  Patient profile:   65 year old female Height:      64 inches Weight:      157 pounds BMI:     27.05 O2 Sat:      97 % on Room air Temp:     98.0 degrees F oral Pulse rate:   99 / minute Resp:     16 per minute BP sitting:   142 / 80  (left arm) Cuff size:   regular  Vitals Entered By: Lanier Prude, CMA(AAMA) (December 20, 2009 10:17 AM)  O2 Flow:  Room air CC: 4 mo f/u  Comments pt c/o lightheadedness/dizziness x 1 wk   CC:  4 mo f/u .  History of Present Illness: C/o nausea and dizziness from turning head or getting up x 1.5 wk.  Current Medications (verified): 1)  Symbicort 80-4.5 Mcg/act  Aero (Budesonide-Formoterol Fumarate) .... 2 Inh Bid 2)  Vitamin D3 1000 Unit  Tabs (Cholecalciferol) .Marland Kitchen.. 1 By Mouth Daily 3)  Loratadine 10 Mg  Tabs (Loratadine) .... Once Daily As Needed Allergies Need Follow-Up Appt For Addtional Refills 4)  Benazepril-Hydrochlorothiazide 20-25 Mg  Tabs (Benazepril-Hydrochlorothiazide) .Marland Kitchen.. 1 Tab By Mouth Daily 5)  Proair Hfa 108 (90 Base) Mcg/act  Aers (Albuterol Sulfate) .... 2 Inh Q4h As Needed Shortness of Breath 6)  Aspirin 325 Mg Tabs (Aspirin) .Marland Kitchen.. 1 By Mouth Once Daily Pc 7)  Furosemide 40 Mg Tabs (Furosemide) .Marland Kitchen.. 1 By Mouth Q Am ( A Water Pill) As Needed Swelling  Allergies (verified): 1)  ! Codeine  Past History:  Family History: Last updated: 08/09/2007 Family History of CAD Female 1st degree relative <60 Family History Lung cancer mother with pancreatic cancer  Social History: Last updated: 10/03/2007 Retired, but working Single Never Smoked Alcohol use-no  Past Medical History: Colonic polyps, hx of Diverticulosis, colon Hypertension Low back pain Osteoarthritis Asthma Allergic rhinitis glucose intolerance Peripheral vascular disease R carotid Dr Edilia Bo 2011 R retinal infarct 2011 Thrombocytosis 2011 Dr Arline Asp  Review of Systems  The patient denies vision loss,  syncope, dyspnea on exertion, abdominal pain, melena, and hematochezia.    Physical Exam  General:  Well-developed,well-nourished,in no acute distress; alert,appropriate and cooperative throughout examination Eyes:  Grossly norma lvision grossly intact, pupils equal, and pupils round.   Ears:  External ear exam shows no significant lesions or deformities.  Otoscopic examination reveals clear canals, tympanic membranes are intact bilaterally without bulging, retraction, inflammation or discharge. Hearing is grossly normal bilaterally. Nose:  External nasal examination shows no deformity or inflammation. Nasal mucosa are pink and moist without lesions or exudates. Mouth:  Oral mucosa and oropharynx without lesions or exudates.  Teeth in good repair. Lungs:  Normal respiratory effort, chest expands symmetrically. Lungs are clear to auscultation, no crackles or wheezes. Heart:  Normal rate and regular rhythm. S1 and S2 normal without gallop, murmur, click, rub or other extra sounds. Abdomen:  Bowel sounds positive,abdomen soft and non-tender without masses, organomegaly or hernias noted. Msk:  No deformity or scoliosis noted of thoracic or lumbar spine.    Neurologic:  H-P (+) on R Skin:  eryth papular rash on arms Psych:  Cognition and judgment appear intact. Alert and cooperative with normal attention span and concentration. No apparent delusions, illusions, hallucinations   Impression & Recommendations:  Problem # 1:  VERTIGO (ICD-780.4) due to BPV Assessment New  Her updated medication list for this problem includes:  Loratadine 10 Mg Tabs (Loratadine) ..... Once daily as needed allergies need follow-up appt for addtional refills    Meclizine Hcl 12.5 Mg Tabs (Meclizine hcl) .Marland Kitchen... 1-2 by mouth qid as needed dizziness Francee Piccolo - Daroff exercise was given to the patient Sick leave 6/21-7/29  Problem # 2:  SKIN RASH (ICD-782.1) sun sensitivity dermatitis Assessment: New  Her updated  medication list for this problem includes:    Triamcinolone Acetonide 0.5 % Crea (Triamcinolone acetonide) ..... Use two times a day prn She does not want to change BP meds  Problem # 3:  THROMBOCYTOSIS (ICD-289.9) Assessment: Comment Only The labs and Hem notes were reviewed with the patient.   Problem # 4:  EDEMA (ICD-782.3) Assessment: Improved  Her updated medication list for this problem includes:    Benazepril-hydrochlorothiazide 20-25 Mg Tabs (Benazepril-hydrochlorothiazide) .Marland Kitchen... 1 tab by mouth daily    Furosemide 40 Mg Tabs (Furosemide) .Marland Kitchen... 1 by mouth q am ( a water pill) as needed swelling  Complete Medication List: 1)  Symbicort 80-4.5 Mcg/act Aero (Budesonide-formoterol fumarate) .... 2 inh bid 2)  Vitamin D3 1000 Unit Tabs (Cholecalciferol) .Marland Kitchen.. 1 by mouth daily 3)  Loratadine 10 Mg Tabs (Loratadine) .... Once daily as needed allergies need follow-up appt for addtional refills 4)  Benazepril-hydrochlorothiazide 20-25 Mg Tabs (Benazepril-hydrochlorothiazide) .Marland Kitchen.. 1 tab by mouth daily 5)  Proair Hfa 108 (90 Base) Mcg/act Aers (Albuterol sulfate) .... 2 inh q4h as needed shortness of breath 6)  Aspirin 325 Mg Tabs (Aspirin) .Marland Kitchen.. 1 by mouth once daily pc 7)  Furosemide 40 Mg Tabs (Furosemide) .Marland Kitchen.. 1 by mouth q am ( a water pill) as needed swelling 8)  Hydroxyurea 500 Mg Caps (Hydroxyurea) .Marland Kitchen.. 1 once daily 9)  Triamcinolone Acetonide 0.5 % Crea (Triamcinolone acetonide) .... Use two times a day prn 10)  Meclizine Hcl 12.5 Mg Tabs (Meclizine hcl) .Marland Kitchen.. 1-2 by mouth qid as needed dizziness  Patient Instructions: 1)  Francee Piccolo - Daroff exercise was given to you - do daily please 2)  Please schedule a follow-up appointment in 3 months. Prescriptions: MECLIZINE HCL 12.5 MG TABS (MECLIZINE HCL) 1-2 by mouth qid as needed dizziness  #60 x 1   Entered and Authorized by:   Tresa Garter MD   Signed by:   Tresa Garter MD on 12/20/2009   Method used:   Electronically to         [facility] (retail)       [address]       [place], [place]  [address]       Ph: [phone]       Fax: [phone]   RxID:   [id number] TRIAMCINOLONE ACETONIDE 0.5 % CREA (TRIAMCINOLONE ACETONIDE) use two times a day prn  #120 g x 3   Entered and Authorized by:   Tresa Garter MD   Signed by:   Tresa Garter MD on 12/20/2009   Method used:   Electronically to        [facility] (retail)       [address]       [place], [place]  [address]       Ph: [phone]       Fax: [phone]   RxID:   [id number]

## 2010-07-29 NOTE — Assessment & Plan Note (Signed)
Summary: 2 mos f/u / #/ cd   Vital Signs:  Patient profile:   65 year old female Weight:      157 pounds Temp:     98.6 degrees F oral Pulse rate:   76 / minute BP sitting:   134 / 64  (left arm)  Vitals Entered By: Tora Perches (August 21, 2009 10:05 AM) CC: f/u Is Patient Diabetic? No   CC:  f/u.  History of Present Illness: The patient presents for a follow up of hypertension, PVD, retinal  infarction, hyperlipidemia   Preventive Screening-Counseling & Management  Alcohol-Tobacco     Smoking Status: never  Current Medications (verified): 1)  Symbicort 80-4.5 Mcg/act  Aero (Budesonide-Formoterol Fumarate) .... 2 Inh Bid 2)  Vitamin D3 1000 Unit  Tabs (Cholecalciferol) .Marland Kitchen.. 1 By Mouth Daily 3)  Loratadine 10 Mg  Tabs (Loratadine) .... Once Daily As Needed Allergies Need Follow-Up Appt For Addtional Refills 4)  Benazepril-Hydrochlorothiazide 20-25 Mg  Tabs (Benazepril-Hydrochlorothiazide) .Marland Kitchen.. 1 Tab By Mouth Daily 5)  Proair Hfa 108 (90 Base) Mcg/act  Aers (Albuterol Sulfate) .... 2 Inh Q4h As Needed Shortness of Breath 6)  Aspirin 325 Mg Tabs (Aspirin) .Marland Kitchen.. 1 By Mouth Once Daily Pc  Allergies: 1)  ! Codeine  Past History:  Past Surgical History: Last updated: 08/09/2007 Denies surgical history  Social History: Last updated: 10/03/2007 Retired, but working Single Never Smoked Alcohol use-no  Past Medical History: Colonic polyps, hx of Diverticulosis, colon Hypertension Low back pain Osteoarthritis Asthma Allergic rhinitis glucose intolerance Peripheral vascular disease R carotid Dr Edilia Bo 2011 R retinal infarct 2011  Review of Systems  The patient denies fever, abdominal pain, melena, weight loss, weight gain, chest pain, prolonged cough, hematochezia, and severe indigestion/heartburn.    Physical Exam  General:  Well-developed,well-nourished,in no acute distress; alert,appropriate and cooperative throughout examination Eyes:  Grossly  norma lvision grossly intact, pupils equal, and pupils round.   Nose:  External nasal examination shows no deformity or inflammation. Nasal mucosa are pink and moist without lesions or exudates. Mouth:  Oral mucosa and oropharynx without lesions or exudates.  Teeth in good repair. Lungs:  Normal respiratory effort, chest expands symmetrically. Lungs are clear to auscultation, no crackles or wheezes. Heart:  Normal rate and regular rhythm. S1 and S2 normal without gallop, murmur, click, rub or other extra sounds. Abdomen:  Bowel sounds positive,abdomen soft and non-tender without masses, organomegaly or hernias noted. Msk:  No deformity or scoliosis noted of thoracic or lumbar spine.    Extremities:  trace left pedal edema and trace right pedal edema.   Neurologic:  No cranial nerve deficits noted. Station and gait are normal. Plantar reflexes are down-going bilaterally. DTRs are symmetrical throughout. Sensory, motor and coordinative functions appear intact. Skin:  Intact without suspicious lesions or rashes Psych:  Cognition and judgment appear intact. Alert and cooperative with normal attention span and concentration. No apparent delusions, illusions, hallucinations   Impression & Recommendations:  Problem # 1:  PERIPHERAL VASCULAR DISEASE (ICD-443.9) Assessment Unchanged On prescription therapy  S/p angio  Problem # 2:  THROMBOCYTOSIS (ICD-289.9) Assessment: Unchanged Cont w/ASA Orders: Hematology Referral (Hematology) Dr Arline Asp  Problem # 3:  RETINAL ISCHEMIA (ICD-362.84) Assessment: Unchanged  On ASA  Orders: Hematology Referral (Hematology)  Problem # 4:  GLUCOSE INTOLERANCE (ICD-271.3) Assessment: Unchanged The labs were reviewed with the patient.   Problem # 5:  HYPERTENSION (ICD-401.9) Assessment: Unchanged  Her updated medication list for this problem includes:    Benazepril-hydrochlorothiazide 20-25  Mg Tabs (Benazepril-hydrochlorothiazide) .Marland Kitchen... 1 tab by mouth  daily    Furosemide 40 Mg Tabs (Furosemide) .Marland Kitchen... 1 by mouth q am ( a water pill) as needed swelling  BP today: 134/64 Prior BP: 126/70 (06/07/2009)  Labs Reviewed: K+: 4.0 (08/14/2009) Creat: : 0.8 (08/14/2009)   Chol: 147 (06/05/2009)   HDL: 33.70 (06/05/2009)   LDL: 92 (06/05/2009)   TG: 108.0 (06/05/2009)  Problem # 6:  LOW BACK PAIN (ICD-724.2) Assessment: Improved  Her updated medication list for this problem includes:    Aspirin 325 Mg Tabs (Aspirin) .Marland Kitchen... 1 by mouth once daily pc  Problem # 7:  EDEMA (ICD-782.3) rare Assessment: Comment Only     Furosemide 40 Mg Tabs (Furosemide) .Marland Kitchen... 1 by mouth q am ( a water pill) as needed swelling  Complete Medication List: 1)  Symbicort 80-4.5 Mcg/act Aero (Budesonide-formoterol fumarate) .... 2 inh bid 2)  Vitamin D3 1000 Unit Tabs (Cholecalciferol) .Marland Kitchen.. 1 by mouth daily 3)  Loratadine 10 Mg Tabs (Loratadine) .... Once daily as needed allergies need follow-up appt for addtional refills 4)  Benazepril-hydrochlorothiazide 20-25 Mg Tabs (Benazepril-hydrochlorothiazide) .Marland Kitchen.. 1 tab by mouth daily 5)  Proair Hfa 108 (90 Base) Mcg/act Aers (Albuterol sulfate) .... 2 inh q4h as needed shortness of breath 6)  Aspirin 325 Mg Tabs (Aspirin) .Marland Kitchen.. 1 by mouth once daily pc 7)  Furosemide 40 Mg Tabs (Furosemide) .Marland Kitchen.. 1 by mouth q am ( a water pill) as needed swelling  Patient Instructions: 1)  Please schedule a follow-up appointment in 4 months. 2)  BMP prior to visit, ICD-9: 3)  Hepatic Panel prior to visit, ICD-9: 4)  CBC w/ Diff prior to visit, ICD-9: 5)  HbgA1C prior to visit, ICD-9:995.20  790.29 Prescriptions: FUROSEMIDE 40 MG TABS (FUROSEMIDE) 1 by mouth q am ( a water pill) as needed swelling  #30 x 3   Entered and Authorized by:   Tresa Garter MD   Signed by:   Tresa Garter MD on 08/21/2009   Method used:   Electronically to        [facility] (retail)       [address]       [place], [place]  [address]       Ph: [phone]       Fax: [phone]   RxID:   [id number]

## 2010-07-29 NOTE — Letter (Signed)
[facility]   Im[facility] By: Lennie Odor 09/26/2009 16:35:52  _____________________________________________________________________  External Attachment:    Type:   Image     Comment:   External Document

## 2010-07-29 NOTE — Letter (Signed)
[facility]   Imported By: Sherian Rein 01/01/2010 09:39:39  _____________________________________________________________________  External Attachment:    Type:   Image     Comment:   External Document

## 2010-07-29 NOTE — Letter (Signed)
Summary: [facility]  [facility]   Imported By: Lester [place] 03/14/2010 13:46:02  _____________________________________________________________________  External Attachment:    Type:   Image     Comment:   External Document

## 2010-07-29 NOTE — Letter (Signed)
[facility]   Imported By: Sherian Rein 10/23/2009 07:47:05  _____________________________________________________________________  External Attachment:    Type:   Image     Comment:   External Document

## 2010-07-29 NOTE — Letter (Signed)
[facility]   Imported By: Lester [place] 01/16/2010 08:08:26  _____________________________________________________________________  External Attachment:    Type:   Image     Comment:   External Document

## 2010-07-29 NOTE — Assessment & Plan Note (Signed)
Summary: 3 MO ROV /NWS   Vital Signs:  Patient profile:   65 year old female Height:      64 inches Weight:      163 pounds BMI:     28.08 O2 Sat:      97 % on Room air Temp:     98.1 degrees F oral Pulse rate:   84 / minute BP sitting:   140 / 70  (left arm) Cuff size:   regular  Vitals Entered By: Alysia Penna (April 09, 2010 3:44 PM)  O2 Flow:  Room air CC: pt here for follow up visit. /cp sma   CC:  pt here for follow up visit. /cp sma.  History of Present Illness: The patient presents for a follow up of hypertension, PVD, asthma, hyperlipidemia   Current Medications (verified): 1)  Symbicort 80-4.5 Mcg/act  Aero (Budesonide-Formoterol Fumarate) .... 2 Inh Bid 2)  Vitamin D3 1000 Unit  Tabs (Cholecalciferol) .Marland Kitchen.. 1 By Mouth Daily 3)  Loratadine 10 Mg  Tabs (Loratadine) .... Once Daily As Needed Allergies Need Follow-Up Appt For Addtional Refills 4)  Benazepril-Hydrochlorothiazide 20-25 Mg  Tabs (Benazepril-Hydrochlorothiazide) .Marland Kitchen.. 1 Tab By Mouth Daily 5)  Proair Hfa 108 (90 Base) Mcg/act  Aers (Albuterol Sulfate) .... 2 Inh Q4h As Needed Shortness of Breath 6)  Aspirin 325 Mg Tabs (Aspirin) .Marland Kitchen.. 1 By Mouth Once Daily Pc 7)  Furosemide 40 Mg Tabs (Furosemide) .Marland Kitchen.. 1 By Mouth Q Am ( A Water Pill) As Needed Swelling 8)  Hydroxyurea 500 Mg Caps (Hydroxyurea) .Marland Kitchen.. 1 Once Daily 9)  Triamcinolone Acetonide 0.5 % Crea (Triamcinolone Acetonide) .... Use Two Times A Day Prn 10)  Meclizine Hcl 12.5 Mg Tabs (Meclizine Hcl) .Marland Kitchen.. 1-2 By Mouth Qid As Needed Dizziness  Allergies (verified): 1)  ! Codeine  Past History:  Social History: Last updated: 10/03/2007 Retired, but working Single Never Smoked Alcohol use-no  Past Medical History: Colonic polyps, hx of Diverticulosis, colon Hypertension Low back pain Osteoarthritis Asthma Allergic rhinitis glucose intolerance Peripheral vascular disease R carotid Dr Edilia Bo 2011 R retinal infarct  2011embolic Thrombocytosis 2011 Dr Arline Asp  Review of Systems  The patient denies fever, dyspnea on exertion, and abdominal pain.    Physical Exam  General:  Well-developed,well-nourished,in no acute distress; alert,appropriate and cooperative throughout examination Nose:  External nasal examination shows no deformity or inflammation. Nasal mucosa are pink and moist without lesions or exudates. Mouth:  Oral mucosa and oropharynx without lesions or exudates.  Teeth in good repair. Lungs:  Normal respiratory effort, chest expands symmetrically. Lungs are clear to auscultation, no crackles or wheezes. Heart:  Normal rate and regular rhythm. S1 and S2 normal without gallop, murmur, click, rub or other extra sounds. Abdomen:  Bowel sounds positive,abdomen soft and non-tender without masses, organomegaly or hernias noted. Msk:  No deformity or scoliosis noted of thoracic or lumbar spine.    Neurologic:  alert & oriented X3 and cranial nerves II-XII intact.   Skin:  clear Psych:  Cognition and judgment appear intact. Alert and cooperative with normal attention span and concentration. No apparent delusions, illusions, hallucinations   Impression & Recommendations:  Problem # 1:  THROMBOCYTOSIS (ICD-289.9) Assessment Unchanged On the regimen of medicine(s) reflected in the chart    Problem # 2:  PERIPHERAL VASCULAR DISEASE (ICD-443.9) Assessment: Unchanged  Problem # 3:  ASTHMA (ICD-493.90) Assessment: Unchanged  Her updated medication list for this problem includes:    Symbicort 80-4.5 Mcg/act Aero (Budesonide-formoterol fumarate) .Marland Kitchen... 2 inh  bid    Proair Hfa 108 (90 Base) Mcg/act Aers (Albuterol sulfate) .Marland Kitchen... 2 inh q4h as needed shortness of breath  Problem # 4:  HYPERTENSION (ICD-401.9) Assessment: Unchanged  Her updated medication list for this problem includes:    Benazepril-hydrochlorothiazide 20-25 Mg Tabs (Benazepril-hydrochlorothiazide) .Marland Kitchen... 1 tab by mouth daily     Furosemide 40 Mg Tabs (Furosemide) .Marland Kitchen... 1 by mouth q am ( a water pill) as needed swelling  BP today: 140/70 Prior BP: 142/80 (12/20/2009)  Labs Reviewed: K+: 4.5 (12/16/2009) Creat: : 0.8 (12/16/2009)   Chol: 147 (06/05/2009)   HDL: 33.70 (06/05/2009)   LDL: 92 (06/05/2009)   TG: 108.0 (06/05/2009)  Complete Medication List: 1)  Symbicort 80-4.5 Mcg/act Aero (Budesonide-formoterol fumarate) .... 2 inh bid 2)  Vitamin D3 1000 Unit Tabs (Cholecalciferol) .Marland Kitchen.. 1 by mouth daily 3)  Loratadine 10 Mg Tabs (Loratadine) .... Once daily as needed allergies need follow-up appt for addtional refills 4)  Benazepril-hydrochlorothiazide 20-25 Mg Tabs (Benazepril-hydrochlorothiazide) .Marland Kitchen.. 1 tab by mouth daily 5)  Proair Hfa 108 (90 Base) Mcg/act Aers (Albuterol sulfate) .... 2 inh q4h as needed shortness of breath 6)  Aspirin 325 Mg Tabs (Aspirin) .Marland Kitchen.. 1 by mouth once daily pc 7)  Furosemide 40 Mg Tabs (Furosemide) .Marland Kitchen.. 1 by mouth q am ( a water pill) as needed swelling 8)  Hydroxyurea 500 Mg Caps (Hydroxyurea) .Marland Kitchen.. 1 once daily 9)  Triamcinolone Acetonide 0.5 % Crea (Triamcinolone acetonide) .... Use two times a day prn  Patient Instructions: 1)  Please schedule a follow-up appointment in 4 months well w/labs.

## 2010-07-30 ENCOUNTER — Ambulatory Visit (INDEPENDENT_AMBULATORY_CARE_PROVIDER_SITE_OTHER): Payer: BC Managed Care – PPO | Admitting: Vascular Surgery

## 2010-07-30 ENCOUNTER — Other Ambulatory Visit (INDEPENDENT_AMBULATORY_CARE_PROVIDER_SITE_OTHER): Payer: BC Managed Care – PPO

## 2010-07-30 ENCOUNTER — Encounter: Payer: Self-pay | Admitting: Internal Medicine

## 2010-07-30 DIAGNOSIS — I6529 Occlusion and stenosis of unspecified carotid artery: Secondary | ICD-10-CM

## 2010-07-31 NOTE — Letter (Signed)
Summary: [facility]  [facility]   Imported By: Sherian Rein 06/02/2010 09:30:11  _____________________________________________________________________  External Attachment:    Type:   Image     Comment:   External Document

## 2010-08-08 NOTE — Procedures (Unsigned)
CAROTID DUPLEX EXAM  INDICATION:  Follow up carotid disease.  HISTORY: Diabetes:  No. Cardiac:  Yes. Hypertension:  Yes. Smoking: Previous Surgery:  No. CV History: Amaurosis Fugax No, Paresthesias No, Hemiparesis No.                                      RIGHT             LEFT Brachial systolic pressure:         138               122 Brachial Doppler waveforms:         WNL               WNL Vertebral direction of flow:        Antegrade         Antegrade DUPLEX VELOCITIES (cm/sec) CCA peak systolic                   105               111 ECA peak systolic                   136               104 ICA peak systolic                   98                81 ICA end diastolic                   32                22 PLAQUE MORPHOLOGY:                  Heterogenous      Heterogenous PLAQUE AMOUNT:                      Minimal           Minimal PLAQUE LOCATION:  IMPRESSION: 1. 1% to 39% plaquing of the bilateral carotid arteries. 2. Bilateral vertebral arteries are within normal limits.       ___________________________________________ Di Kindle. Edilia Bo, M.D.  LT/MEDQ  D:  07/30/2010  T:  07/30/2010  Job:  [id number]

## 2010-08-11 NOTE — Assessment & Plan Note (Signed)
OFFICE VISIT  BRINN, WESTBY DOB:  [birth year]                                       07/30/2010 KVQQV#:[id number]  I saw the patient in the office today for continued follow-up of her carotid disease.  I initially saw her in consultation in January 2011 with a moderate right carotid stenosis.  She had had a sudden onset of visual field cut in the right eye and was found to have a retinal infarct with an embolus to the right retinal artery.  Duplex scan showed a 40% to 59% right carotid stenosis.  Given that she only had a mild stenosis, and it was not clear if this was responsible for her retinal infarct, she did undergo a cerebral arteriogram on 08/05/2009 and this showed no significant carotid bifurcation disease.  There was an approximately 70% kink of the internal carotid artery distally, but this did not appear to be flow-limiting or significant otherwise.  She comes in for a 1-year follow-up duplex scan.  Since I saw her last, she has had no history of stroke, TIAs, expressive or receptive aphasia or amaurosis fugax.  Her only change in her medical history is that she has thrombocytosis and is on hydroxyurea for this.  SOCIAL HISTORY:  She is widowed.  She has 5 children.  She does not use tobacco and never has.  PHYSICAL EXAMINATION:  This is a pleasant 65 year old woman who appears her stated age.  Blood pressure is 131/70, heart rate is 76, saturation is 98%.  Lungs:  Clear bilaterally to auscultation without rales, rhonchi or wheezing.  Cardiovascular:  I do not detect any carotid bruits.  She has a regular rate and rhythm.  She has warm and well- perfused feet with no significant lower extremity swelling.  Abdomen: Soft and nontender with normal-pitched bowel sounds.  Musculoskeletal: No major deformities or cyanosis.  Neurologic:  She has no focal weakness or paresthesias.  I did independently interpret her carotid duplex scan today which  shows less than 39% stenosis bilaterally.  Both vertebral arteries are patent with normally directed flow.  I reassured her that there is no evidence of significant carotid disease and she remains asymptomatic.  I have ordered a follow-up carotid duplex scan in 1 year, and I will see her back at that time.  If that looks good, we will stretch her follow-up out to a year and a half.  In the meantime she knows to continue taking her aspirin.    Di Kindle. Edilia Bo, M.D. Electronically Signed  CSD/MEDQ  D:  07/30/2010  T:  07/31/2010  Job:  [id number]  cc:   Georgina Quint. [name], MD

## 2010-08-19 ENCOUNTER — Encounter (HOSPITAL_BASED_OUTPATIENT_CLINIC_OR_DEPARTMENT_OTHER): Payer: BC Managed Care – PPO | Admitting: Oncology

## 2010-08-19 ENCOUNTER — Other Ambulatory Visit (HOSPITAL_COMMUNITY): Payer: Self-pay | Admitting: Oncology

## 2010-08-19 DIAGNOSIS — D473 Essential (hemorrhagic) thrombocythemia: Secondary | ICD-10-CM

## 2010-08-19 DIAGNOSIS — Z86718 Personal history of other venous thrombosis and embolism: Secondary | ICD-10-CM

## 2010-08-19 DIAGNOSIS — Z7982 Long term (current) use of aspirin: Secondary | ICD-10-CM

## 2010-08-19 LAB — CBC WITH DIFFERENTIAL/PLATELET
BASO%: 0.5 % (ref 0.0–2.0)
EOS%: 0.3 % (ref 0.0–7.0)
HCT: 31.8 % — ABNORMAL LOW (ref 34.8–46.6)
HGB: 10.8 g/dL — ABNORMAL LOW (ref 11.6–15.9)
MONO%: 8.4 % (ref 0.0–14.0)
NEUT%: 44.8 % (ref 38.4–76.8)
Platelets: 327 10*3/uL (ref 145–400)
WBC: 3.6 10*3/uL — ABNORMAL LOW (ref 3.9–10.3)

## 2010-08-20 NOTE — Letter (Signed)
Summary: Chuck Hint MD/V V Reatha Armour MD/V V S   Imported By: Lester Lacona 08/15/2010 07:23:42  _____________________________________________________________________  External Attachment:    Type:   Image     Comment:   External Document

## 2010-09-01 ENCOUNTER — Telehealth: Payer: Self-pay | Admitting: Internal Medicine

## 2010-09-09 NOTE — Progress Notes (Signed)
Summary: Stomach cramps  Phone Note Call from Patient Call back at Work Phone [phone] Call back at [phone]   Summary of Call: Pt called c/o back and stomach cramps. She took laxative w/o no relief in symptoms. No c/o constipation, diarrhea, nausea or vomiting. Pt has been drinking vinegar w/warm water and honey. She states she feels better today and also reports muliple other co-wkers around her had same symptoms. Pt advised to monitor symptoms and call office back w/any further questions or concerns.  Initial call taken by: Lamar Sprinkles, CMA,  September 01, 2010 2:37 PM  Follow-up for Phone Call        agree Thank you!  Follow-up by: Tresa Garter MD,  September 01, 2010 10:53 PM

## 2010-09-15 LAB — CBC
MCV: 94.8 fL (ref 78.0–100.0)
RBC: 3.6 MIL/uL — ABNORMAL LOW (ref 3.87–5.11)
WBC: 5.3 10*3/uL (ref 4.0–10.5)

## 2010-09-15 LAB — DIFFERENTIAL
Lymphocytes Relative: 27 % (ref 12–46)
Lymphs Abs: 1.5 10*3/uL (ref 0.7–4.0)
Monocytes Relative: 9 % (ref 3–12)
Neutro Abs: 3.2 10*3/uL (ref 1.7–7.7)
Neutrophils Relative %: 60 % (ref 43–77)

## 2010-09-18 ENCOUNTER — Other Ambulatory Visit: Payer: Self-pay | Admitting: Internal Medicine

## 2010-09-18 DIAGNOSIS — Z1231 Encounter for screening mammogram for malignant neoplasm of breast: Secondary | ICD-10-CM

## 2010-09-18 LAB — POCT I-STAT, CHEM 8
Calcium, Ion: 1.22 mmol/L (ref 1.12–1.32)
Chloride: 105 mEq/L (ref 96–112)
Glucose, Bld: 138 mg/dL — ABNORMAL HIGH (ref 70–99)
HCT: 37 % (ref 36.0–46.0)

## 2010-09-23 ENCOUNTER — Other Ambulatory Visit (HOSPITAL_COMMUNITY): Payer: Self-pay | Admitting: Oncology

## 2010-09-23 ENCOUNTER — Encounter (HOSPITAL_BASED_OUTPATIENT_CLINIC_OR_DEPARTMENT_OTHER): Payer: BC Managed Care – PPO | Admitting: Oncology

## 2010-09-23 DIAGNOSIS — Z7982 Long term (current) use of aspirin: Secondary | ICD-10-CM

## 2010-09-23 DIAGNOSIS — Z86718 Personal history of other venous thrombosis and embolism: Secondary | ICD-10-CM

## 2010-09-23 DIAGNOSIS — D473 Essential (hemorrhagic) thrombocythemia: Secondary | ICD-10-CM

## 2010-09-23 LAB — COMPREHENSIVE METABOLIC PANEL
ALT: <8 U/L (ref 0–35)
AST: 12 U/L (ref 0–37)
Alkaline Phosphatase: 59 U/L (ref 39–117)
Creatinine, Ser: 0.82 mg/dL (ref 0.40–1.20)
Total Bilirubin: 0.4 mg/dL (ref 0.3–1.2)

## 2010-09-23 LAB — CBC WITH DIFFERENTIAL/PLATELET
BASO%: 0.3 % (ref 0.0–2.0)
EOS%: 0.3 % (ref 0.0–7.0)
HCT: 31.5 % — ABNORMAL LOW (ref 34.8–46.6)
LYMPH%: 43.6 % (ref 14.0–49.7)
MCH: 40.9 pg — ABNORMAL HIGH (ref 25.1–34.0)
MCHC: 35.9 g/dL (ref 31.5–36.0)
MCV: 114.1 fL — ABNORMAL HIGH (ref 79.5–101.0)
MONO%: 7.3 % (ref 0.0–14.0)
NEUT%: 48.5 % (ref 38.4–76.8)
Platelets: 259 10*3/uL (ref 145–400)
lymph#: 1.4 10*3/uL (ref 0.9–3.3)

## 2010-10-01 ENCOUNTER — Other Ambulatory Visit: Payer: Self-pay

## 2010-10-06 ENCOUNTER — Ambulatory Visit (INDEPENDENT_AMBULATORY_CARE_PROVIDER_SITE_OTHER): Payer: BC Managed Care – PPO | Admitting: Internal Medicine

## 2010-10-06 ENCOUNTER — Encounter: Payer: Self-pay | Admitting: Internal Medicine

## 2010-10-06 ENCOUNTER — Other Ambulatory Visit (INDEPENDENT_AMBULATORY_CARE_PROVIDER_SITE_OTHER): Payer: Medicare Other

## 2010-10-06 DIAGNOSIS — M545 Low back pain, unspecified: Secondary | ICD-10-CM

## 2010-10-06 DIAGNOSIS — I10 Essential (primary) hypertension: Secondary | ICD-10-CM

## 2010-10-06 DIAGNOSIS — J45909 Unspecified asthma, uncomplicated: Secondary | ICD-10-CM

## 2010-10-06 DIAGNOSIS — J309 Allergic rhinitis, unspecified: Secondary | ICD-10-CM

## 2010-10-06 DIAGNOSIS — D759 Disease of blood and blood-forming organs, unspecified: Secondary | ICD-10-CM

## 2010-10-06 DIAGNOSIS — Z0000 Encounter for general adult medical examination without abnormal findings: Secondary | ICD-10-CM

## 2010-10-06 DIAGNOSIS — M19039 Primary osteoarthritis, unspecified wrist: Secondary | ICD-10-CM

## 2010-10-06 LAB — CBC WITH DIFFERENTIAL/PLATELET
Basophils Relative: 0.5 % (ref 0.0–3.0)
Eosinophils Absolute: 0 10*3/uL (ref 0.0–0.7)
Eosinophils Relative: 0.6 % (ref 0.0–5.0)
Hemoglobin: 11.3 g/dL — ABNORMAL LOW (ref 12.0–15.0)
Lymphocytes Relative: 42.5 % (ref 12.0–46.0)
MCHC: 34.5 g/dL (ref 30.0–36.0)
MCV: 125.2 fl — ABNORMAL HIGH (ref 78.0–100.0)
Neutro Abs: 1.3 10*3/uL — ABNORMAL LOW (ref 1.4–7.7)
Neutrophils Relative %: 44.6 % (ref 43.0–77.0)
RBC: 2.6 Mil/uL — ABNORMAL LOW (ref 3.87–5.11)
WBC: 2.8 10*3/uL — ABNORMAL LOW (ref 4.5–10.5)

## 2010-10-06 LAB — BASIC METABOLIC PANEL
CO2: 32 mEq/L (ref 19–32)
Calcium: 9.3 mg/dL (ref 8.4–10.5)
Chloride: 97 mEq/L (ref 96–112)
Sodium: 137 mEq/L (ref 135–145)

## 2010-10-06 LAB — HEPATIC FUNCTION PANEL
ALT: 7 U/L (ref 0–35)
Alkaline Phosphatase: 51 U/L (ref 39–117)
Bilirubin, Direct: 0.1 mg/dL (ref 0.0–0.3)
Total Bilirubin: 0.9 mg/dL (ref 0.3–1.2)
Total Protein: 7.1 g/dL (ref 6.0–8.3)

## 2010-10-06 LAB — URINALYSIS, ROUTINE W REFLEX MICROSCOPIC
Specific Gravity, Urine: 1.02 (ref 1.000–1.030)
Total Protein, Urine: NEGATIVE
Urine Glucose: NEGATIVE
Urobilinogen, UA: 0.2 (ref 0.0–1.0)

## 2010-10-06 LAB — LIPID PANEL
LDL Cholesterol: 95 mg/dL (ref 0–99)
Total CHOL/HDL Ratio: 5

## 2010-10-06 MED ORDER — BENAZEPRIL-HYDROCHLOROTHIAZIDE 20-25 MG PO TABS: 1.0000 | ORAL_TABLET | Freq: Every day | ORAL | Status: DC

## 2010-10-06 MED ORDER — TRAMADOL HCL 50 MG PO TABS: 50.0000 mg | ORAL_TABLET | Freq: Three times a day (TID) | ORAL | Status: DC | PRN

## 2010-10-06 MED ORDER — LORATADINE 10 MG PO TABS: 10.0000 mg | ORAL_TABLET | Freq: Every day | ORAL | Status: DC | PRN

## 2010-10-06 MED ORDER — ALBUTEROL SULFATE HFA 108 (90 BASE) MCG/ACT IN AERS: 2.0000 | INHALATION_SPRAY | RESPIRATORY_TRACT | Status: DC | PRN

## 2010-10-06 NOTE — Assessment & Plan Note (Signed)
Worse. Will get labs to r/o CTD Tramadol prn

## 2010-10-06 NOTE — Assessment & Plan Note (Signed)
On  Loratidine 

## 2010-10-06 NOTE — Assessment & Plan Note (Signed)
She will use an MDI prn

## 2010-10-06 NOTE — Assessment & Plan Note (Signed)
Last labs on 3/27 PLT 259K

## 2010-10-06 NOTE — Assessment & Plan Note (Signed)
On Rx 

## 2010-10-06 NOTE — Assessment & Plan Note (Signed)
Tramadol prn 

## 2010-10-06 NOTE — Progress Notes (Signed)
Subjective:    Patient ID: Paula Griffith, female    DOB: 06-May-1946, 65 y.o.   MRN: [id number]  HPI    The patient is here to follow up on chronic thrombocythosis, headaches and chronic moderate LBP and wrist arthritis symptoms controlled with medicines, diet and exercise.  Review of Systems  Constitutional: Negative.  Negative for fever, chills, diaphoresis, activity change, appetite change, fatigue and unexpected weight change.  HENT: Negative for hearing loss, ear pain, nosebleeds, congestion, sore throat, facial swelling, rhinorrhea, sneezing, mouth sores, trouble swallowing, neck pain, neck stiffness, postnasal drip, sinus pressure and tinnitus.   Eyes: Negative for pain, discharge, redness, itching and visual disturbance.  Respiratory: Negative for cough, chest tightness, shortness of breath, wheezing and stridor.   Cardiovascular: Negative for chest pain, palpitations and leg swelling.  Gastrointestinal: Negative for nausea, diarrhea, constipation, blood in stool, abdominal distention, anal bleeding and rectal pain.  Genitourinary: Negative for dysuria, urgency, frequency, hematuria, flank pain, vaginal bleeding, vaginal discharge, difficulty urinating, genital sores and pelvic pain.  Musculoskeletal: Negative for back pain, joint swelling, arthralgias and gait problem.  Skin: Negative.  Negative for rash.  Neurological: Negative for dizziness, tremors, seizures, syncope, speech difficulty, weakness, numbness and headaches.  Hematological: Negative for adenopathy. Does not bruise/bleed easily.  Psychiatric/Behavioral: Negative for suicidal ideas, behavioral problems, sleep disturbance, dysphoric mood and decreased concentration. The patient is not nervous/anxious.    Wt Readings from Last 3 Encounters:  10/06/10 156 lb (70.761 kg)  04/09/10 163 lb (73.936 kg)  12/20/09 157 lb (71.215 kg)        Objective:   Physical Exam  Constitutional: She appears well-developed and  well-nourished. No distress.  HENT:  Head: Normocephalic.  Right Ear: External ear normal.  Left Ear: External ear normal.  Nose: Nose normal.  Mouth/Throat: Oropharynx is clear and moist.  Eyes: Conjunctivae are normal. Pupils are equal, round, and reactive to light. Right eye exhibits no discharge. Left eye exhibits no discharge.  Neck: Normal range of motion. Neck supple. No JVD present. No tracheal deviation present. No thyromegaly present.  Cardiovascular: Normal rate, regular rhythm and normal heart sounds.   Pulmonary/Chest: No stridor. No respiratory distress. She has no wheezes.  Abdominal: Soft. Bowel sounds are normal. She exhibits no distension and no mass. There is no tenderness. There is no rebound and no guarding.  Musculoskeletal: She exhibits tenderness. She exhibits no edema.       B radial wrists with tender firm swelling  Lymphadenopathy:    She has no cervical adenopathy.  Neurological: She displays normal reflexes. No cranial nerve deficit. She exhibits normal muscle tone. Coordination normal.  Skin: No rash noted. No erythema. No pallor.  Psychiatric: She has a normal mood and affect. Her behavior is normal. Judgment and thought content normal.          Assessment & Plan:  THROMBOCYTOSIS Last labs on 3/27 PLT 259K  Arthritis pain, wrist Worse. Will get labs to r/o CTD Tramadol prn  HYPERTENSION On Rx  ALLERGIC RHINITIS On Loratidine   ASTHMA She will use an MDI prn  LOW BACK PAIN Tramadol prn    Lab Results  Component Value Date   WBC 2.8* 10/06/2010   HGB 11.3* 10/06/2010   HCT 32.6* 10/06/2010   PLT 269.0 10/06/2010   CHOL 145 10/06/2010   TRIG 91.0 10/06/2010   HDL 31.70* 10/06/2010   ALT 7 10/06/2010   AST 16 10/06/2010   NA 137 10/06/2010   K 3.9  10/06/2010   CL 97 10/06/2010   CREATININE 0.9 10/06/2010   BUN 18 10/06/2010   CO2 32 10/06/2010   TSH 1.02 10/06/2010   HGBA1C 6.2 12/16/2009

## 2010-10-22 ENCOUNTER — Encounter (HOSPITAL_BASED_OUTPATIENT_CLINIC_OR_DEPARTMENT_OTHER): Payer: BC Managed Care – PPO | Admitting: Oncology

## 2010-10-22 ENCOUNTER — Other Ambulatory Visit (HOSPITAL_COMMUNITY): Payer: Self-pay | Admitting: Oncology

## 2010-10-22 DIAGNOSIS — Z86718 Personal history of other venous thrombosis and embolism: Secondary | ICD-10-CM

## 2010-10-22 DIAGNOSIS — D473 Essential (hemorrhagic) thrombocythemia: Secondary | ICD-10-CM

## 2010-10-22 LAB — CBC WITH DIFFERENTIAL/PLATELET
BASO%: 0.6 % (ref 0.0–2.0)
Eosinophils Absolute: 0 10*3/uL (ref 0.0–0.5)
LYMPH%: 46.5 % (ref 14.0–49.7)
MCHC: 33.9 g/dL (ref 31.5–36.0)
MONO#: 0.4 10*3/uL (ref 0.1–0.9)
NEUT#: 1.5 10*3/uL (ref 1.5–6.5)
Platelets: 284 10*3/uL (ref 145–400)
RBC: 2.59 10*6/uL — ABNORMAL LOW (ref 3.70–5.45)
RDW: 13.8 % (ref 11.2–14.5)
WBC: 3.6 10*3/uL — ABNORMAL LOW (ref 3.9–10.3)
lymph#: 1.7 10*3/uL (ref 0.9–3.3)

## 2010-10-27 ENCOUNTER — Ambulatory Visit
Admission: RE | Admit: 2010-10-27 | Discharge: 2010-10-27 | Disposition: A | Discharge status: Home / Self Care | Payer: BC Managed Care – PPO | Source: Ambulatory Visit | Attending: Internal Medicine | Admitting: Internal Medicine

## 2010-10-27 DIAGNOSIS — Z1231 Encounter for screening mammogram for malignant neoplasm of breast: Secondary | ICD-10-CM
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
## 2010-11-19 ENCOUNTER — Encounter (HOSPITAL_BASED_OUTPATIENT_CLINIC_OR_DEPARTMENT_OTHER): Payer: Medicare Other | Admitting: Oncology

## 2010-11-19 ENCOUNTER — Other Ambulatory Visit (HOSPITAL_COMMUNITY): Payer: Self-pay | Admitting: Oncology

## 2010-11-19 DIAGNOSIS — Z86718 Personal history of other venous thrombosis and embolism: Secondary | ICD-10-CM

## 2010-11-19 DIAGNOSIS — D473 Essential (hemorrhagic) thrombocythemia: Secondary | ICD-10-CM

## 2010-11-19 LAB — CBC WITH DIFFERENTIAL/PLATELET
BASO%: 0.4 % (ref 0.0–2.0)
Eosinophils Absolute: 0.1 10*3/uL (ref 0.0–0.5)
HCT: 31.2 % — ABNORMAL LOW (ref 34.8–46.6)
MCHC: 34.4 g/dL (ref 31.5–36.0)
MONO#: 0.3 10*3/uL (ref 0.1–0.9)
NEUT#: 1.6 10*3/uL (ref 1.5–6.5)
NEUT%: 45.4 % (ref 38.4–76.8)
Platelets: 304 10*3/uL (ref 145–400)
WBC: 3.5 10*3/uL — ABNORMAL LOW (ref 3.9–10.3)
lymph#: 1.5 10*3/uL (ref 0.9–3.3)

## 2010-12-17 ENCOUNTER — Encounter (HOSPITAL_BASED_OUTPATIENT_CLINIC_OR_DEPARTMENT_OTHER): Payer: Medicare Other | Admitting: Oncology

## 2010-12-17 ENCOUNTER — Other Ambulatory Visit (HOSPITAL_COMMUNITY): Payer: Self-pay | Admitting: Oncology

## 2010-12-17 DIAGNOSIS — D473 Essential (hemorrhagic) thrombocythemia: Secondary | ICD-10-CM

## 2010-12-17 DIAGNOSIS — Z86718 Personal history of other venous thrombosis and embolism: Secondary | ICD-10-CM

## 2010-12-17 LAB — CBC WITH DIFFERENTIAL/PLATELET
Basophils Absolute: 0 10*3/uL (ref 0.0–0.1)
HCT: 32.8 % — ABNORMAL LOW (ref 34.8–46.6)
HGB: 11 g/dL — ABNORMAL LOW (ref 11.6–15.9)
LYMPH%: 33.8 % (ref 14.0–49.7)
MCH: 41.9 pg — ABNORMAL HIGH (ref 25.1–34.0)
MONO#: 0.3 10*3/uL (ref 0.1–0.9)
NEUT%: 57.8 % (ref 38.4–76.8)
Platelets: 286 10*3/uL (ref 145–400)
WBC: 3.7 10*3/uL — ABNORMAL LOW (ref 3.9–10.3)
lymph#: 1.3 10*3/uL (ref 0.9–3.3)

## 2011-01-05 ENCOUNTER — Ambulatory Visit: Payer: Medicare Other | Admitting: Internal Medicine

## 2011-01-05 DIAGNOSIS — Z0289 Encounter for other administrative examinations: Secondary | ICD-10-CM

## 2011-01-20 ENCOUNTER — Encounter (HOSPITAL_BASED_OUTPATIENT_CLINIC_OR_DEPARTMENT_OTHER): Payer: Medicare Other | Admitting: Oncology

## 2011-01-20 ENCOUNTER — Other Ambulatory Visit (HOSPITAL_COMMUNITY): Payer: Self-pay | Admitting: Oncology

## 2011-01-20 DIAGNOSIS — D473 Essential (hemorrhagic) thrombocythemia: Secondary | ICD-10-CM

## 2011-01-20 DIAGNOSIS — Z86718 Personal history of other venous thrombosis and embolism: Secondary | ICD-10-CM

## 2011-01-20 DIAGNOSIS — L819 Disorder of pigmentation, unspecified: Secondary | ICD-10-CM

## 2011-01-20 LAB — CBC WITH DIFFERENTIAL/PLATELET
Basophils Absolute: 0 10*3/uL (ref 0.0–0.1)
EOS%: 0.5 % (ref 0.0–7.0)
HCT: 33.1 % — ABNORMAL LOW (ref 34.8–46.6)
HGB: 11.4 g/dL — ABNORMAL LOW (ref 11.6–15.9)
LYMPH%: 38.5 % (ref 14.0–49.7)
MCH: 42.7 pg — ABNORMAL HIGH (ref 25.1–34.0)
MCV: 124.3 fL — ABNORMAL HIGH (ref 79.5–101.0)
MONO%: 10.6 % (ref 0.0–14.0)
NEUT%: 50 % (ref 38.4–76.8)

## 2011-01-20 LAB — COMPREHENSIVE METABOLIC PANEL
AST: 16 U/L (ref 0–37)
Alkaline Phosphatase: 50 U/L (ref 39–117)
BUN: 12 mg/dL (ref 6–23)
Calcium: 9.2 mg/dL (ref 8.4–10.5)
Chloride: 102 mEq/L (ref 96–112)
Creatinine, Ser: 0.93 mg/dL (ref 0.50–1.10)
Total Bilirubin: 0.5 mg/dL (ref 0.3–1.2)

## 2011-02-03 ENCOUNTER — Encounter (HOSPITAL_BASED_OUTPATIENT_CLINIC_OR_DEPARTMENT_OTHER): Payer: Medicare Other | Admitting: Oncology

## 2011-02-03 ENCOUNTER — Other Ambulatory Visit (HOSPITAL_COMMUNITY): Payer: Self-pay | Admitting: Oncology

## 2011-02-03 DIAGNOSIS — Z86718 Personal history of other venous thrombosis and embolism: Secondary | ICD-10-CM

## 2011-02-03 DIAGNOSIS — D473 Essential (hemorrhagic) thrombocythemia: Secondary | ICD-10-CM

## 2011-02-03 LAB — CBC WITH DIFFERENTIAL/PLATELET
Basophils Absolute: 0 10*3/uL (ref 0.0–0.1)
Eosinophils Absolute: 0 10*3/uL (ref 0.0–0.5)
HCT: 32.9 % — ABNORMAL LOW (ref 34.8–46.6)
HGB: 11.6 g/dL (ref 11.6–15.9)
MCV: 121.9 fL — ABNORMAL HIGH (ref 79.5–101.0)
MONO%: 11.5 % (ref 0.0–14.0)
NEUT#: 1.9 10*3/uL (ref 1.5–6.5)
RDW: 12.8 % (ref 11.2–14.5)

## 2011-02-17 ENCOUNTER — Other Ambulatory Visit (HOSPITAL_COMMUNITY): Payer: Self-pay | Admitting: Oncology

## 2011-02-17 ENCOUNTER — Encounter (HOSPITAL_BASED_OUTPATIENT_CLINIC_OR_DEPARTMENT_OTHER): Payer: Medicare Other | Admitting: Oncology

## 2011-02-17 DIAGNOSIS — D473 Essential (hemorrhagic) thrombocythemia: Secondary | ICD-10-CM

## 2011-02-17 DIAGNOSIS — Z86718 Personal history of other venous thrombosis and embolism: Secondary | ICD-10-CM

## 2011-02-17 LAB — CBC WITH DIFFERENTIAL/PLATELET
Basophils Absolute: 0 10*3/uL (ref 0.0–0.1)
Eosinophils Absolute: 0 10*3/uL (ref 0.0–0.5)
HGB: 11 g/dL — ABNORMAL LOW (ref 11.6–15.9)
LYMPH%: 39.9 % (ref 14.0–49.7)
MCV: 120.8 fL — ABNORMAL HIGH (ref 79.5–101.0)
MONO%: 13.8 % (ref 0.0–14.0)
NEUT#: 1.8 10*3/uL (ref 1.5–6.5)
Platelets: 323 10*3/uL (ref 145–400)
RBC: 2.62 10*6/uL — ABNORMAL LOW (ref 3.70–5.45)

## 2011-03-03 ENCOUNTER — Encounter (HOSPITAL_BASED_OUTPATIENT_CLINIC_OR_DEPARTMENT_OTHER): Payer: Medicare Other | Admitting: Oncology

## 2011-03-03 ENCOUNTER — Other Ambulatory Visit (HOSPITAL_COMMUNITY): Payer: Self-pay | Admitting: Oncology

## 2011-03-03 DIAGNOSIS — D473 Essential (hemorrhagic) thrombocythemia: Secondary | ICD-10-CM

## 2011-03-03 DIAGNOSIS — Z86718 Personal history of other venous thrombosis and embolism: Secondary | ICD-10-CM

## 2011-03-03 LAB — CBC WITH DIFFERENTIAL/PLATELET
BASO%: 0.4 % (ref 0.0–2.0)
EOS%: 1.2 % (ref 0.0–7.0)
HCT: 32 % — ABNORMAL LOW (ref 34.8–46.6)
LYMPH%: 34.6 % (ref 14.0–49.7)
MCH: 41.5 pg — ABNORMAL HIGH (ref 25.1–34.0)
MCHC: 34.7 g/dL (ref 31.5–36.0)
NEUT%: 51.3 % (ref 38.4–76.8)
RBC: 2.68 10*6/uL — ABNORMAL LOW (ref 3.70–5.45)
lymph#: 1.7 10*3/uL (ref 0.9–3.3)

## 2011-03-17 ENCOUNTER — Other Ambulatory Visit (HOSPITAL_COMMUNITY): Payer: Self-pay | Admitting: Oncology

## 2011-03-17 ENCOUNTER — Encounter (HOSPITAL_BASED_OUTPATIENT_CLINIC_OR_DEPARTMENT_OTHER): Payer: Medicare Other | Admitting: Oncology

## 2011-03-17 DIAGNOSIS — H349 Unspecified retinal vascular occlusion: Secondary | ICD-10-CM

## 2011-03-17 DIAGNOSIS — D473 Essential (hemorrhagic) thrombocythemia: Secondary | ICD-10-CM

## 2011-03-17 DIAGNOSIS — Z86718 Personal history of other venous thrombosis and embolism: Secondary | ICD-10-CM

## 2011-03-17 LAB — CBC WITH DIFFERENTIAL/PLATELET
BASO%: 0.4 % (ref 0.0–2.0)
EOS%: 0.6 % (ref 0.0–7.0)
HGB: 11.1 g/dL — ABNORMAL LOW (ref 11.6–15.9)
MCH: 39.1 pg — ABNORMAL HIGH (ref 25.1–34.0)
MCHC: 35.6 g/dL (ref 31.5–36.0)
RDW: 12.1 % (ref 11.2–14.5)
WBC: 4.9 10*3/uL (ref 3.9–10.3)
lymph#: 1.8 10*3/uL (ref 0.9–3.3)

## 2011-03-17 LAB — COMPREHENSIVE METABOLIC PANEL
ALT: <8 U/L (ref 0–35)
AST: 16 U/L (ref 0–37)
Alkaline Phosphatase: 54 U/L (ref 39–117)
Sodium: 141 mEq/L (ref 135–145)
Total Bilirubin: 0.4 mg/dL (ref 0.3–1.2)
Total Protein: 7.1 g/dL (ref 6.0–8.3)

## 2011-03-17 LAB — LACTATE DEHYDROGENASE: LDH: 164 U/L (ref 94–250)

## 2011-03-31 ENCOUNTER — Encounter (HOSPITAL_BASED_OUTPATIENT_CLINIC_OR_DEPARTMENT_OTHER): Payer: Medicare Other | Admitting: Oncology

## 2011-03-31 ENCOUNTER — Other Ambulatory Visit (HOSPITAL_COMMUNITY): Payer: Self-pay | Admitting: Oncology

## 2011-03-31 DIAGNOSIS — D473 Essential (hemorrhagic) thrombocythemia: Secondary | ICD-10-CM

## 2011-03-31 DIAGNOSIS — L819 Disorder of pigmentation, unspecified: Secondary | ICD-10-CM

## 2011-03-31 DIAGNOSIS — Z86718 Personal history of other venous thrombosis and embolism: Secondary | ICD-10-CM

## 2011-03-31 LAB — CBC WITH DIFFERENTIAL/PLATELET
BASO%: 0.3 % (ref 0.0–2.0)
Basophils Absolute: 0 10*3/uL (ref 0.0–0.1)
EOS%: 0.8 % (ref 0.0–7.0)
Eosinophils Absolute: 0 10*3/uL (ref 0.0–0.5)
HCT: 31.6 % — ABNORMAL LOW (ref 34.8–46.6)
HGB: 10.7 g/dL — ABNORMAL LOW (ref 11.6–15.9)
LYMPH%: 35.7 % (ref 14.0–49.7)
MCH: 39.4 pg — ABNORMAL HIGH (ref 25.1–34.0)
MCHC: 33.8 g/dL (ref 31.5–36.0)
MCV: 116.7 fL — ABNORMAL HIGH (ref 79.5–101.0)
MONO#: 0.5 10*3/uL (ref 0.1–0.9)
MONO%: 10 % (ref 0.0–14.0)
NEUT#: 2.4 10*3/uL (ref 1.5–6.5)
NEUT%: 53.2 % (ref 38.4–76.8)
Platelets: 350 10*3/uL (ref 145–400)
RBC: 2.71 10*6/uL — ABNORMAL LOW (ref 3.70–5.45)
RDW: 12.4 % (ref 11.2–14.5)
WBC: 4.6 10*3/uL (ref 3.9–10.3)
lymph#: 1.6 10*3/uL (ref 0.9–3.3)

## 2011-04-08 ENCOUNTER — Ambulatory Visit (INDEPENDENT_AMBULATORY_CARE_PROVIDER_SITE_OTHER): Payer: Medicare Other | Admitting: *Deleted

## 2011-04-08 DIAGNOSIS — Z23 Encounter for immunization: Secondary | ICD-10-CM

## 2011-04-14 ENCOUNTER — Other Ambulatory Visit (HOSPITAL_COMMUNITY): Payer: Self-pay | Admitting: Oncology

## 2011-04-14 ENCOUNTER — Encounter (HOSPITAL_BASED_OUTPATIENT_CLINIC_OR_DEPARTMENT_OTHER): Payer: Medicare Other | Admitting: Oncology

## 2011-04-14 DIAGNOSIS — D473 Essential (hemorrhagic) thrombocythemia: Secondary | ICD-10-CM

## 2011-04-14 DIAGNOSIS — Z86718 Personal history of other venous thrombosis and embolism: Secondary | ICD-10-CM

## 2011-04-14 DIAGNOSIS — L819 Disorder of pigmentation, unspecified: Secondary | ICD-10-CM

## 2011-04-14 LAB — CBC WITH DIFFERENTIAL/PLATELET
BASO%: 0.5 % (ref 0.0–2.0)
EOS%: 0.4 % (ref 0.0–7.0)
LYMPH%: 31.6 % (ref 14.0–49.7)
MCHC: 34.1 g/dL (ref 31.5–36.0)
MONO#: 0.4 10*3/uL (ref 0.1–0.9)
MONO%: 9.2 % (ref 0.0–14.0)
Platelets: 353 10*3/uL (ref 145–400)
RBC: 2.84 10*6/uL — ABNORMAL LOW (ref 3.70–5.45)
WBC: 4.1 10*3/uL (ref 3.9–10.3)

## 2011-04-15 ENCOUNTER — Encounter: Payer: Self-pay | Admitting: Medical Oncology

## 2011-04-28 ENCOUNTER — Other Ambulatory Visit (HOSPITAL_COMMUNITY): Payer: Self-pay | Admitting: Oncology

## 2011-04-28 ENCOUNTER — Encounter (HOSPITAL_BASED_OUTPATIENT_CLINIC_OR_DEPARTMENT_OTHER): Payer: Medicare Other | Admitting: Oncology

## 2011-04-28 DIAGNOSIS — D473 Essential (hemorrhagic) thrombocythemia: Secondary | ICD-10-CM

## 2011-04-28 DIAGNOSIS — L819 Disorder of pigmentation, unspecified: Secondary | ICD-10-CM

## 2011-04-28 DIAGNOSIS — Z86718 Personal history of other venous thrombosis and embolism: Secondary | ICD-10-CM

## 2011-04-28 LAB — CBC WITH DIFFERENTIAL/PLATELET
Basophils Absolute: 0 10*3/uL (ref 0.0–0.1)
EOS%: 0.2 % (ref 0.0–7.0)
Eosinophils Absolute: 0 10*3/uL (ref 0.0–0.5)
HCT: 32 % — ABNORMAL LOW (ref 34.8–46.6)
HGB: 11.3 g/dL — ABNORMAL LOW (ref 11.6–15.9)
MCH: 37.8 pg — ABNORMAL HIGH (ref 25.1–34.0)
MONO#: 0.4 10*3/uL (ref 0.1–0.9)
NEUT#: 2.5 10*3/uL (ref 1.5–6.5)
RDW: 12.7 % (ref 11.2–14.5)
WBC: 4.9 10*3/uL (ref 3.9–10.3)
lymph#: 2 10*3/uL (ref 0.9–3.3)

## 2011-05-12 ENCOUNTER — Ambulatory Visit (HOSPITAL_BASED_OUTPATIENT_CLINIC_OR_DEPARTMENT_OTHER): Payer: Medicare Other | Admitting: Oncology

## 2011-05-12 ENCOUNTER — Other Ambulatory Visit (HOSPITAL_COMMUNITY): Payer: Self-pay | Admitting: Oncology

## 2011-05-12 ENCOUNTER — Other Ambulatory Visit (HOSPITAL_BASED_OUTPATIENT_CLINIC_OR_DEPARTMENT_OTHER): Payer: Medicare Other | Admitting: Lab

## 2011-05-12 ENCOUNTER — Telehealth: Payer: Self-pay | Admitting: Oncology

## 2011-05-12 VITALS — BP 118/70 | HR 80 | Temp 98.1°F | Ht 63.0 in | Wt 155.8 lb

## 2011-05-12 DIAGNOSIS — D473 Essential (hemorrhagic) thrombocythemia: Secondary | ICD-10-CM

## 2011-05-12 DIAGNOSIS — L819 Disorder of pigmentation, unspecified: Secondary | ICD-10-CM

## 2011-05-12 DIAGNOSIS — D759 Disease of blood and blood-forming organs, unspecified: Secondary | ICD-10-CM

## 2011-05-12 DIAGNOSIS — Z86718 Personal history of other venous thrombosis and embolism: Secondary | ICD-10-CM

## 2011-05-12 LAB — CBC WITH DIFFERENTIAL/PLATELET
Basophils Absolute: 0 10*3/uL (ref 0.0–0.1)
Eosinophils Absolute: 0 10*3/uL (ref 0.0–0.5)
HCT: 34.5 % — ABNORMAL LOW (ref 34.8–46.6)
HGB: 11.8 g/dL (ref 11.6–15.9)
MCV: 114.9 fL — ABNORMAL HIGH (ref 79.5–101.0)
MONO%: 7 % (ref 0.0–14.0)
NEUT#: 3.1 10*3/uL (ref 1.5–6.5)
NEUT%: 62.1 % (ref 38.4–76.8)
RDW: 11.9 % (ref 11.2–14.5)

## 2011-05-12 LAB — COMPREHENSIVE METABOLIC PANEL
Albumin: 4.2 g/dL (ref 3.5–5.2)
BUN: 15 mg/dL (ref 6–23)
CO2: 30 mEq/L (ref 19–32)
Glucose, Bld: 125 mg/dL — ABNORMAL HIGH (ref 70–99)
Potassium: 4.2 mEq/L (ref 3.5–5.3)
Sodium: 137 mEq/L (ref 135–145)
Total Bilirubin: 0.4 mg/dL (ref 0.3–1.2)
Total Protein: 7.2 g/dL (ref 6.0–8.3)

## 2011-05-12 LAB — LACTATE DEHYDROGENASE: LDH: 166 U/L (ref 94–250)

## 2011-05-12 NOTE — Progress Notes (Signed)
CC:   Georgina Quint. [name], MD  HISTORY:  Marnesha Gagen was seen today for followup of her thrombocytosis in association with an infarct involving her right retina due to an embolism in the right retinal artery.  This occurred in late November 2010.  Bone marrow carried out on 11/14/2009 was suggestive of myeloproliferative disorder.  Iron stores were abundant.  The patient had mutational studies, which included JAK2 V617F, JAK2 exon 12, AND MPL515, which were all not detected.  An ultrasound did not show splenomegaly.  Nevertheless, the patient is felt to have most likely essential thrombocythemia.  She has been maintained most recently on hydroxyurea 9 capsules per week.  She takes hydroxyurea 500 mg twice daily on Mondays and Thursdays and 500 mg the other 5 days of the week.  She is also on aspirin.  She was last seen by [place] on 03/17/2011.  We had been cutting back on her hydroxyurea dose because of some leukopenia.  Her platelet count has been well-controlled.  The patient's condition is stable.  She denies any new thrombotic episodes, bleeding, bruising, any sense of ill health, or any change in her overall condition.  MEDICINES:  Reviewed and recorded.  PHYSICAL EXAMINATION:  General Appearance:  Mrs. Somma looks well.  She is now 65 year old.  Vital Signs:  Weight is 155 pounds and 12.8 ounces. Height is 5 feet and 3 inches.  Body surface area is 1.77 m2.  Blood pressure 118/70 in the left arm sitting.  Other vital signs are normal. HEENT:  There is no scleral icterus.  Mouth and pharynx are benign. Heart:  Normal.  Lungs:  Normal.  Breasts:  Not examined.  The patient states that she does get yearly mammograms.  Her last colonoscopy was approximately 4 years ago.  Abdomen:  Benign with no organomegaly or masses palpable.  I could not appreciate any splenomegaly.  Extremities: No peripheral edema, clubbing, petechiae, or purpura.  LABORATORY DATA TODAY:  White count 5.1, ANC  3.1, hemoglobin 11.8, hematocrit 34.5, and platelets 362,000.  There are 62% neutrophils, 30% lymphocytes.  MCV and MCH are increased as one would expect in a patient taking hydroxyurea.  Chemistries from 03/17/2011 were normal, except for a slightly elevated glucose of 105.  Chemistries today are pending.  IMPRESSION AND PLAN:  Mrs. Brodrick seems to be doing well.  We suspect that she has essential thrombocythemia or some other myeloproliferative disorder.  We will continue the current treatment program, i.e., 9 capsules of hydroxyurea per week in addition to aspirin.  The patient takes 325 mg enteric-coated daily.  We have been checking CBCs every 2 weeks.  We will increase the interval of the CBCs monthly as CBCs have been fairly stable recently.  We will plan to see Mrs. Satterwhite again in 4 months at which time we will check CBC and chemistries.    ______________________________ Samul Dada, M.D. DSM/MEDQ  D:  05/12/2011  T:  05/12/2011  Job:  [id number]

## 2011-05-12 NOTE — Telephone Encounter (Signed)
gve the pt her dec-march 2013 appt calendar °

## 2011-05-12 NOTE — Progress Notes (Signed)
This office note has been dictated.  #[id number]

## 2011-05-27 ENCOUNTER — Encounter: Payer: Self-pay | Admitting: Internal Medicine

## 2011-05-27 ENCOUNTER — Ambulatory Visit (INDEPENDENT_AMBULATORY_CARE_PROVIDER_SITE_OTHER): Payer: Medicare Other | Admitting: Internal Medicine

## 2011-05-27 ENCOUNTER — Encounter: Payer: Self-pay | Admitting: *Deleted

## 2011-05-27 VITALS — BP 132/64 | HR 100 | Temp 99.5°F | Ht 63.0 in | Wt 156.4 lb

## 2011-05-27 DIAGNOSIS — S93409A Sprain of unspecified ligament of unspecified ankle, initial encounter: Secondary | ICD-10-CM

## 2011-05-27 DIAGNOSIS — R110 Nausea: Secondary | ICD-10-CM

## 2011-05-27 DIAGNOSIS — S93401A Sprain of unspecified ligament of right ankle, initial encounter: Secondary | ICD-10-CM

## 2011-05-27 DIAGNOSIS — J069 Acute upper respiratory infection, unspecified: Secondary | ICD-10-CM

## 2011-05-27 MED ORDER — HYDROCODONE-HOMATROPINE 5-1.5 MG/5ML PO SYRP: 5.0000 mL | ORAL_SOLUTION | Freq: Four times a day (QID) | ORAL | Status: AC | PRN

## 2011-05-27 MED ORDER — AZITHROMYCIN 250 MG PO TABS: ORAL_TABLET | ORAL | Status: AC

## 2011-05-27 MED ORDER — PROMETHAZINE HCL 25 MG PO TABS: 12.5000 mg | ORAL_TABLET | Freq: Four times a day (QID) | ORAL | Status: DC | PRN

## 2011-05-27 NOTE — Progress Notes (Signed)
  Subjective:    HPI  complains of cold and cough symptoms  Onset 2 days ago, progressive symptoms  associated with rhinorrhea, sneezing, sore throat, mild headache and low grade fever/chills Also myalgias, sinus pressure and mild-mod chest congestion No relief with OTC meds -Mucinex and coughing causes nausea, no vomiting Precipitated by sick contacts  Also reports twisting right ankle last week, wearing brace. Swelling much improved since onset of trauma. Weightbearing without pain unless prolonged activity  Past Medical History  Diagnosis Date  . Colon polyps   . Diverticulosis of colon   . HTN (hypertension)   . LBP (low back pain)   . Osteoarthritis   . Asthma   . Allergic rhinitis   . Glucose intolerance (impaired glucose tolerance)   . PVD (peripheral vascular disease) 2011    Right Carotid Dr Edilia Bo  . 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 Thrombocytopathy 2011    Dr Arline Asp    Review of Systems Constitutional: No fever or night sweats, no unexpected weight change Pulmonary: No pleurisy or hemoptysis Cardiovascular: No chest pain or palpitations     Objective:   Physical Exam BP 132/64  Pulse 100  Temp(Src) 99.5 F (37.5 C) (Oral)  Ht 5\' 3"  (1.6 m)  Wt 156 lb 6.4 oz (70.943 kg)  BMI 27.71 kg/m2  SpO2 97% GEN: mildly ill appearing and audible head/chest congestion, dtr at side HENT: NCAT, mild sinus tenderness bilaterally, nares with clear discharge, oropharynx mild erythema, no exudate Eyes: Vision grossly intact, no conjunctivitis Lungs: Clear to auscultation without rhonchi or wheeze, no increased work of breathing Cardiovascular: Regular rate and rhythm, no bilateral edema Mskel: R ankle in soft support - no swelling, nontender to palpation, FROM and WB without problems      Assessment & Plan:  Viral URI  Cough, postnasal drip related to above Nausea, related to cough R ankle sprain   Explained lack of efficacy for antibiotics in viral  disease Prescription cough suppression - new prescriptions done Symptomatic care with Tylenol or Advil, hydration and rest -  salt gargle advised as needed Paper printed prescription for azithromycin to use if symptoms last greater than 10 days or change in sputum, noting underlying pulmonary disease and age  Reassurance regarding the natural history and course of healing for ankle sprain, conservative care and followup with primary if unimproved in 6-8 weeks  Work excuse note

## 2011-05-27 NOTE — Patient Instructions (Addendum)
It was good to see you today. If you develop worsening symptoms or fever, call and we can reconsider antibiotics, but it does not appear necessary to use antibiotics at this time. Paper written prescription for Z-Pak antibiotic provided for you to fill if symptoms unimproved or worse in next 7 days Prescription cough syrup and pills for nausea to help control symptoms - Your prescription(s) have been submitted to your pharmacy. Please take as directed and contact our office if you believe you are having problem(s) with the medication(s). Okay to continue Sudafed or Tylenol as needed for congestion or fever/chills Work note today provided as per request-okay to return tomorrow as long as fever resolved Continue to wear support on right ankle as you are doing; this will take up to 8 weeks to heal. If symptoms worse with increasing pain swelling or other problems, call for reevaluation

## 2011-06-08 ENCOUNTER — Other Ambulatory Visit: Payer: Self-pay | Admitting: Oncology

## 2011-06-08 DIAGNOSIS — D759 Disease of blood and blood-forming organs, unspecified: Secondary | ICD-10-CM

## 2011-06-09 ENCOUNTER — Ambulatory Visit (HOSPITAL_BASED_OUTPATIENT_CLINIC_OR_DEPARTMENT_OTHER): Payer: Medicare Other | Admitting: Lab

## 2011-06-09 DIAGNOSIS — D759 Disease of blood and blood-forming organs, unspecified: Secondary | ICD-10-CM

## 2011-06-09 DIAGNOSIS — D473 Essential (hemorrhagic) thrombocythemia: Secondary | ICD-10-CM

## 2011-06-09 LAB — CBC WITH DIFFERENTIAL/PLATELET
Eosinophils Absolute: 0 10*3/uL (ref 0.0–0.5)
MONO#: 0.6 10*3/uL (ref 0.1–0.9)
NEUT#: 2.4 10*3/uL (ref 1.5–6.5)
RBC: 2.88 10*6/uL — ABNORMAL LOW (ref 3.70–5.45)
RDW: 12.6 % (ref 11.2–14.5)
WBC: 5.3 10*3/uL (ref 3.9–10.3)

## 2011-06-25 ENCOUNTER — Other Ambulatory Visit (HOSPITAL_BASED_OUTPATIENT_CLINIC_OR_DEPARTMENT_OTHER): Payer: Medicare Other | Admitting: Lab

## 2011-06-25 DIAGNOSIS — D759 Disease of blood and blood-forming organs, unspecified: Secondary | ICD-10-CM

## 2011-06-25 LAB — CBC WITH DIFFERENTIAL/PLATELET
Eosinophils Absolute: 0 10*3/uL (ref 0.0–0.5)
HCT: 32.5 % — ABNORMAL LOW (ref 34.8–46.6)
HGB: 10.9 g/dL — ABNORMAL LOW (ref 11.6–15.9)
LYMPH%: 40.1 % (ref 14.0–49.7)
MONO#: 0.4 10*3/uL (ref 0.1–0.9)
NEUT#: 1.8 10*3/uL (ref 1.5–6.5)
NEUT%: 47.5 % (ref 38.4–76.8)
Platelets: 327 10*3/uL (ref 145–400)
WBC: 3.9 10*3/uL (ref 3.9–10.3)

## 2011-07-07 ENCOUNTER — Other Ambulatory Visit: Payer: Self-pay | Admitting: *Deleted

## 2011-07-07 ENCOUNTER — Other Ambulatory Visit (HOSPITAL_BASED_OUTPATIENT_CLINIC_OR_DEPARTMENT_OTHER): Payer: Medicare Other | Admitting: Lab

## 2011-07-07 ENCOUNTER — Other Ambulatory Visit: Payer: Self-pay | Admitting: Medical Oncology

## 2011-07-07 DIAGNOSIS — D473 Essential (hemorrhagic) thrombocythemia: Secondary | ICD-10-CM

## 2011-07-07 DIAGNOSIS — D759 Disease of blood and blood-forming organs, unspecified: Secondary | ICD-10-CM

## 2011-07-07 LAB — CBC WITH DIFFERENTIAL/PLATELET
BASO%: 0.5 % (ref 0.0–2.0)
HCT: 32.3 % — ABNORMAL LOW (ref 34.8–46.6)
LYMPH%: 45.9 % (ref 14.0–49.7)
MCH: 37.1 pg — ABNORMAL HIGH (ref 25.1–34.0)
MCHC: 34.4 g/dL (ref 31.5–36.0)
MONO#: 0.4 10*3/uL (ref 0.1–0.9)
NEUT%: 44.3 % (ref 38.4–76.8)
Platelets: 355 10*3/uL (ref 145–400)
WBC: 4.3 10*3/uL (ref 3.9–10.3)

## 2011-07-07 MED ORDER — HYDROXYUREA 500 MG PO CAPS: 500.0000 mg | ORAL_CAPSULE | ORAL | Status: DC

## 2011-08-04 ENCOUNTER — Encounter: Payer: Self-pay | Admitting: Vascular Surgery

## 2011-08-04 ENCOUNTER — Other Ambulatory Visit (HOSPITAL_BASED_OUTPATIENT_CLINIC_OR_DEPARTMENT_OTHER): Payer: Medicare Other | Admitting: Lab

## 2011-08-04 DIAGNOSIS — D759 Disease of blood and blood-forming organs, unspecified: Secondary | ICD-10-CM

## 2011-08-04 LAB — CBC WITH DIFFERENTIAL/PLATELET
Basophils Absolute: 0 10*3/uL (ref 0.0–0.1)
EOS%: 0.5 % (ref 0.0–7.0)
HGB: 11.4 g/dL — ABNORMAL LOW (ref 11.6–15.9)
LYMPH%: 33 % (ref 14.0–49.7)
MCH: 37.8 pg — ABNORMAL HIGH (ref 25.1–34.0)
MCV: 113.6 fL — ABNORMAL HIGH (ref 79.5–101.0)
MONO%: 9.2 % (ref 0.0–14.0)
NEUT%: 56.9 % (ref 38.4–76.8)
Platelets: 384 10*3/uL (ref 145–400)
RDW: 12.3 % (ref 11.2–14.5)

## 2011-08-05 ENCOUNTER — Ambulatory Visit (INDEPENDENT_AMBULATORY_CARE_PROVIDER_SITE_OTHER): Payer: Medicare Other | Admitting: Vascular Surgery

## 2011-08-05 ENCOUNTER — Other Ambulatory Visit (INDEPENDENT_AMBULATORY_CARE_PROVIDER_SITE_OTHER): Payer: Medicare Other | Admitting: *Deleted

## 2011-08-05 ENCOUNTER — Encounter: Payer: Self-pay | Admitting: Vascular Surgery

## 2011-08-05 VITALS — BP 134/80 | HR 85 | Resp 16 | Ht 63.0 in | Wt 153.6 lb

## 2011-08-05 DIAGNOSIS — I6529 Occlusion and stenosis of unspecified carotid artery: Secondary | ICD-10-CM

## 2011-08-05 NOTE — Assessment & Plan Note (Signed)
I have been following this patient with mild bilateral carotid disease. Since I saw her last year ago she has remained asymptomatic. Carotid duplex scan today shows minimal disease bilaterally with stenoses a less than 39% bilaterally. This reason I think it is safe to stretcher follow up out to 18 months. I've ordered a fall carotid duplex scan in 18 months and I will see her back at that time. She knows to call sooner if she has problems.

## 2011-08-05 NOTE — Progress Notes (Signed)
[facility] [place]  Patient name: Paula Griffith MRN: [id number] DOB: 11/06/1945 Sex: female  REASON FOR VISIT: follow up of mild bilateral carotid disease.  HPI: Paula Griffith is a 66 y.o. female I been following with mild bilateral carotid disease. Since I saw her last year ago she has remained asymptomatic. She denies any history of stroke, TIAs, expressive or receptive aphasia, or amaurosis fugax. He has a history of hypertension which is been stable and her current medications.  Past Medical History  Diagnosis Date  . Colon polyps   . Diverticulosis of colon   . HTN (hypertension)   . LBP (low back pain)   . Osteoarthritis   . Asthma   . Allergic rhinitis   . Glucose intolerance (impaired glucose tolerance)   . PVD (peripheral vascular disease) 2011    Right Carotid Dr Edilia Bo  . 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 Thrombocytopathy 2011    Dr Arline Asp    Family History  Problem Relation Age of Onset  . Coronary artery disease      Female 1st degree relative Female <60  . Lung cancer    . Pancreatic cancer Mother   . Arthritis Mother     RA  . Early death Father 66    MI  . Diabetes Sister     SOCIAL HISTORY: History  Substance Use Topics  . Smoking status: Never Smoker   . Smokeless tobacco: Not on file  . Alcohol Use: No    Allergies  Allergen Reactions  . Codeine Nausea Only    Current Outpatient Prescriptions  Medication Sig Dispense Refill  . albuterol (PROAIR HFA) 108 (90 BASE) MCG/ACT inhaler Inhale 2 puffs into the lungs every 4 (four) hours as needed. For shortness of breath  1 Inhaler  6  . aspirin 325 MG EC tablet Take 325 mg by mouth daily after breakfast.        . benazepril-hydrochlorthiazide (LOTENSIN HCT) 20-25 MG per tablet Take 1 tablet by mouth daily.  30 tablet  11  . budesonide-formoterol (SYMBICORT) 80-4.5 MCG/ACT inhaler Inhale 2 puffs into the lungs 2 (two) times daily.        . Cholecalciferol 1000 UNITS  tablet Take 1,000 Units by mouth daily.        . furosemide (LASIX) 40 MG tablet Take 40 mg by mouth every morning. As needed for swelling       . hydroxyurea (HYDREA) 500 MG capsule Take 1 capsule (500 mg total) by mouth as directed. Mon & Thurs 1tab twice a day. The rest 1 pill once a day  60 capsule  0  . loratadine (CLARITIN) 10 MG tablet Take 1 tablet (10 mg total) by mouth daily as needed.  30 tablet  11  . traMADol (ULTRAM) 50 MG tablet Take 1 tablet (50 mg total) by mouth every 8 (eight) hours as needed for pain.  100 tablet  5  . meclizine (ANTIVERT) 12.5 MG tablet 1-2 tablets as needed daily for dizziness       . triamcinolone (KENALOG) 0.5 % cream Apply topically 2 (two) times daily as needed.          REVIEW OF SYSTEMS: Arly.Keller ] denotes positive finding; [  ] denotes negative finding  CARDIOVASCULAR:  [ ]  chest pain   [ ]  chest pressure   [ ]  palpitations   [ ]  orthopnea   [ ]  dyspnea on exertion   [ ]  claudication   [ ]   rest pain   Arly.Keller ] DVT   [ ]  phlebitis PULMONARY:   [ ]  productive cough   Arly.Keller ] asthma   [ ]  wheezing NEUROLOGIC:   [ ]  weakness  [ ]  paresthesias  [ ]  aphasia  Arly.Keller ] amaurosis remote history  [ ]  dizziness HEMATOLOGIC:   [ ]  bleeding problems   [ ]  clotting disorders MUSCULOSKELETAL:  [ ]  joint pain   [ ]  joint swelling [ ]  leg swelling GASTROINTESTINAL: [ ]   blood in stool  [ ]   hematemesis GENITOURINARY:  [ ]   dysuria  [ ]   hematuria PSYCHIATRIC:  [ ]  history of major depression INTEGUMENTARY:  [ ]  rashes  [ ]  ulcers CONSTITUTIONAL:  [ ]  fever   [ ]  chills  PHYSICAL EXAM: Filed Vitals:   08/05/11 1515  BP: 134/80  Pulse: 85  Resp: 16  Height: 5\' 3"  (1.6 m)  Weight: 153 lb 9.6 oz (69.673 kg)   Body mass index is 27.21 kg/(m^2). GENERAL: The patient is a well-nourished female, in no acute distress. The vital signs are documented above. CARDIOVASCULAR: There is a regular rate and rhythm without significant murmur appreciated. I do not detect carotid  bruits. PULMONARY: There is good air exchange bilaterally without wheezing or rales. ABDOMEN: Soft and non-tender with normal pitched bowel sounds.  MUSCULOSKELETAL: There are no major deformities or cyanosis. NEUROLOGIC: No focal weakness or paresthesias are detected. SKIN: There are no ulcers or rashes noted. PSYCHIATRIC: The patient has a normal affect.  DATA:  I have independently interpreted her carotid duplex scan which shows less than 39% carotid stenoses bilaterally. Both vertebral arteries are patent with antegrade flow.  MEDICAL ISSUES:  Occlusion and stenosis of carotid artery without mention of cerebral infarction I have been following this patient with mild bilateral carotid disease. Since I saw her last year ago she has remained asymptomatic. Carotid duplex scan today shows minimal disease bilaterally with stenoses a less than 39% bilaterally. This reason I think it is safe to stretcher follow up out to 18 months. I've ordered a fall carotid duplex scan in 18 months and I will see her back at that time. She knows to call sooner if she has problems.    Alasha Mcguinness S [facility] [place] Beeper: [phone]

## 2011-08-12 NOTE — Procedures (Unsigned)
CAROTID DUPLEX EXAM  INDICATION:  Follow up carotid disease.  HISTORY: Diabetes:  No. Cardiac:  Yes. Hypertension:  Yes. Smoking:  No. Previous Surgery:  No. CV History:  Currently asymptomatic. Amaurosis Fugax No, Paresthesias No, Hemiparesis No                                      RIGHT             LEFT Brachial systolic pressure:         121               118 Brachial Doppler waveforms:         Normal            Normal Vertebral direction of flow:        Antegrade         Antegrade DUPLEX VELOCITIES (cm/sec) CCA peak systolic                   82                81 ECA peak systolic                   132               85 ICA peak systolic                   99                90 ICA end diastolic                   35                39 PLAQUE MORPHOLOGY:                  Heterogenous      Heterogenous PLAQUE AMOUNT:                      Minimal           Minimal PLAQUE LOCATION:                    Bifurcation       Bifurcation  IMPRESSION: 1. Bilateral internal carotid artery velocities suggest 1-39%     stenosis. 2. Antegrade vertebral arteries bilaterally. 3. Stable in comparison to the previous exam.  ___________________________________________ Di Kindle. Edilia Bo, M.D.  EM/MEDQ  D:  08/06/2011  T:  08/06/2011  Job:  [id number]

## 2011-09-01 ENCOUNTER — Other Ambulatory Visit: Payer: Medicare Other

## 2011-09-11 ENCOUNTER — Encounter: Payer: Self-pay | Admitting: Oncology

## 2011-09-11 ENCOUNTER — Other Ambulatory Visit (HOSPITAL_BASED_OUTPATIENT_CLINIC_OR_DEPARTMENT_OTHER): Payer: Medicare Other | Admitting: Lab

## 2011-09-11 ENCOUNTER — Telehealth: Payer: Self-pay | Admitting: Oncology

## 2011-09-11 ENCOUNTER — Ambulatory Visit (HOSPITAL_BASED_OUTPATIENT_CLINIC_OR_DEPARTMENT_OTHER): Payer: Medicare Other | Admitting: Oncology

## 2011-09-11 VITALS — BP 124/74 | HR 88 | Temp 99.0°F | Ht 63.0 in | Wt 155.8 lb

## 2011-09-11 DIAGNOSIS — D759 Disease of blood and blood-forming organs, unspecified: Secondary | ICD-10-CM

## 2011-09-11 DIAGNOSIS — D473 Essential (hemorrhagic) thrombocythemia: Secondary | ICD-10-CM

## 2011-09-11 LAB — COMPREHENSIVE METABOLIC PANEL
Albumin: 4.5 g/dL (ref 3.5–5.2)
Alkaline Phosphatase: 57 U/L (ref 39–117)
BUN: 12 mg/dL (ref 6–23)
Calcium: 9.7 mg/dL (ref 8.4–10.5)
Chloride: 104 mEq/L (ref 96–112)
Glucose, Bld: 124 mg/dL — ABNORMAL HIGH (ref 70–99)
Potassium: 3.8 mEq/L (ref 3.5–5.3)
Sodium: 141 mEq/L (ref 135–145)
Total Protein: 7 g/dL (ref 6.0–8.3)

## 2011-09-11 LAB — CBC WITH DIFFERENTIAL/PLATELET
Basophils Absolute: 0 10*3/uL (ref 0.0–0.1)
Eosinophils Absolute: 0 10*3/uL (ref 0.0–0.5)
HCT: 32.2 % — ABNORMAL LOW (ref 34.8–46.6)
HGB: 11.2 g/dL — ABNORMAL LOW (ref 11.6–15.9)
MONO#: 0.5 10*3/uL (ref 0.1–0.9)
NEUT#: 2.9 10*3/uL (ref 1.5–6.5)
NEUT%: 57.7 % (ref 38.4–76.8)
RDW: 12.4 % (ref 11.2–14.5)
WBC: 5.1 10*3/uL (ref 3.9–10.3)
lymph#: 1.6 10*3/uL (ref 0.9–3.3)

## 2011-09-11 NOTE — Progress Notes (Signed)
CC:   Georgina Quint. [name], MD  PROBLEM LIST: 1. Essential thrombocythemia with thrombocytosis going back to 2000.     Mutations for JAK2 V617F, Exon 12, and MPL515 were negative.  The     patient had a bone marrow on 11/14/2009 which showed slightly     hypercellular marrow for the patient's age, along with abundant     iron stores.  There was minimal reticulin fibrosis.  Trichrome     stains failed to show any definite collagenous fibrosis.  The     patient is maintained on hydroxyurea and aspirin since mid June     2011. 2. Right retinal artery occlusion and subsequent retinal infarct on     05/22/2009.  The patient's vision has returned to near normal. 3. Peripheral vascular disease involving the right carotid artery     detected 08/05/2009. 4. Hypertension. 5. Allergic rhinitis. 6. History of asthma. 7. Adenomatous colonic polyps. 8. Diverticulosis.  MEDICATIONS: 1. Albuterol inhaled 2 puffs every 4 hours as needed. 2. Aspirin 325 mg daily. 3. Lotensin/hydrochlorothiazide 20/25 one daily. 4. Symbicort 80/4.5 inhale 2 puffs twice daily. 5. Cholecalciferol 1000 units daily. 6. Lasix 40 mg daily. 7. Hydroxyurea 500 mg twice a day on Mondays and Thursdays and the     other 5 days of the week 500 mg daily, i.e. 9 capsules per week. 8. Claritin 10 mg daily as needed. 9. Ultram 50 mg every 8 hours as needed.  HISTORY:  Paula Griffith was seen today for followup of her presumed essential thrombocythemia, currently maintained on aspirin and hydroxyurea.  Ms. Habermehl was last seen by [place] on 05/12/2011.  She comes in every month for a CBC.  Her platelet counts have been well maintained between 327,000 and 461,000.  Her platelet count on 06/09/2011 was 461,000.  The patient needs dental work and specifically 2 tooth extractions from the right upper teeth.  The patient's dentist has requested that aspirin be stopped for 3 days, which should be okay.  The patient otherwise has done well  with no thrombotic problems.  No bleeding or bruising problems, stroke-like episodes, etc.  Her health has been stable.  She is due to have her dental extractions on Monday, the 18th.  PHYSICAL EXAMINATION:  General:  Ms. Lemler looks well.  She will soon be 66 years old on April 17[birth year].  Vital Signs:  Weight is 155.8 pounds, height 5 feet 3 inches, body surface area 1.77 sq m.  Blood pressure 124/74.  Other vital signs are normal.  HEENT:  There is no scleral icterus.  Mouth and pharynx are benign.  Lymph:  There is no peripheral adenopathy palpable.  Heart/Lungs:  Normal.  Breasts:  Not examined. The patient does have yearly mammograms.  Abdomen:  Benign with no organomegaly or masses palpable.  Extremities:  No peripheral edema, clubbing, petechiae, or purpura.  Neurologic Exam:  Grossly normal.  LABORATORY DATA:  Today white count 5.1, ANC 2.9, hemoglobin 11.2, hematocrit 32.2, platelets 159,000.  Chemistries today are pending. Chemistries from 05/12/2011 were normal except for a glucose of 125.  IMAGING STUDIES: 1. Ultrasound of the abdomen on 10/04/2009 showed normal spleen size     with a length of 6.2 cm, normal echogenicity, and no focal     abnormalities. 2. Digital screening mammogram on 10/27/2010 was negative.  IMPRESSION AND PLAN:  Ms. Kloehn seems to be doing quite well at this time with good control of her platelet count and no thrombotic bleeding or bruising  problems.  Her dentist has requested that her aspirin be stopped for about 3 days, which should be okay.  The patient will continue to check CBCs every month and we will plan to see Ms. Howatt again in 4 months, at which time we will check CBC and chemistries.  She will be due for mammograms in April or May.  In looking through her chart, I see where her last colonoscopy was carried out on 05/24/2007. She did have adenomatous polyps.  It has been over 4 years since that colonoscopy and she may be due for another  colonoscopy.    ______________________________ Samul Dada, M.D. DSM/MEDQ  D:  09/11/2011  T:  09/11/2011  Job:  [id number]

## 2011-09-11 NOTE — Telephone Encounter (Signed)
appts made and printed for pt aom °

## 2011-09-11 NOTE — Progress Notes (Signed)
This office note has been dictated.  #[id number]

## 2011-09-29 ENCOUNTER — Other Ambulatory Visit: Payer: Self-pay | Admitting: Internal Medicine

## 2011-10-07 ENCOUNTER — Encounter: Payer: Self-pay | Admitting: Internal Medicine

## 2011-10-07 ENCOUNTER — Ambulatory Visit (INDEPENDENT_AMBULATORY_CARE_PROVIDER_SITE_OTHER): Payer: Medicare Other | Admitting: Internal Medicine

## 2011-10-07 VITALS — BP 148/76 | HR 92 | Temp 98.5°F | Resp 16 | Wt 150.0 lb

## 2011-10-07 DIAGNOSIS — I10 Essential (primary) hypertension: Secondary | ICD-10-CM

## 2011-10-07 DIAGNOSIS — R7309 Other abnormal glucose: Secondary | ICD-10-CM

## 2011-10-07 DIAGNOSIS — J309 Allergic rhinitis, unspecified: Secondary | ICD-10-CM

## 2011-10-07 DIAGNOSIS — R609 Edema, unspecified: Secondary | ICD-10-CM

## 2011-10-07 DIAGNOSIS — D759 Disease of blood and blood-forming organs, unspecified: Secondary | ICD-10-CM

## 2011-10-07 DIAGNOSIS — J45909 Unspecified asthma, uncomplicated: Secondary | ICD-10-CM

## 2011-10-07 DIAGNOSIS — R739 Hyperglycemia, unspecified: Secondary | ICD-10-CM | POA: Insufficient documentation

## 2011-10-07 DIAGNOSIS — L309 Dermatitis, unspecified: Secondary | ICD-10-CM | POA: Insufficient documentation

## 2011-10-07 DIAGNOSIS — L259 Unspecified contact dermatitis, unspecified cause: Secondary | ICD-10-CM

## 2011-10-07 MED ORDER — BENAZEPRIL-HYDROCHLOROTHIAZIDE 20-25 MG PO TABS: 1.0000 | ORAL_TABLET | Freq: Every day | ORAL | Status: DC

## 2011-10-07 MED ORDER — BUDESONIDE-FORMOTEROL FUMARATE 80-4.5 MCG/ACT IN AERO: 2.0000 | INHALATION_SPRAY | Freq: Two times a day (BID) | RESPIRATORY_TRACT | Status: DC

## 2011-10-07 MED ORDER — ALBUTEROL SULFATE HFA 108 (90 BASE) MCG/ACT IN AERS: 2.0000 | INHALATION_SPRAY | RESPIRATORY_TRACT | Status: DC | PRN

## 2011-10-07 MED ORDER — LORATADINE 10 MG PO TABS: 10.0000 mg | ORAL_TABLET | Freq: Every day | ORAL | Status: DC

## 2011-10-07 MED ORDER — TRIAMCINOLONE ACETONIDE 0.5 % EX OINT: TOPICAL_OINTMENT | Freq: Two times a day (BID) | CUTANEOUS | Status: DC

## 2011-10-07 NOTE — Assessment & Plan Note (Signed)
Continue with current prescription therapy as reflected on the Med list.  

## 2011-10-07 NOTE — Progress Notes (Signed)
Patient ID: Paula Griffith, female   DOB: 15-Oct-1945, 66 y.o.   MRN: [id number]  Subjective:    Patient ID: Paula Griffith, female    DOB: 05-02-1946, 66 y.o.   MRN: [id number]  HPI    The patient is here to follow up on chronic thrombocythosis, headaches and chronic moderate LBP and wrist arthritis symptoms controlled with medicines, diet and exercise. C/o dry skin on knuckles.  Review of Systems  Constitutional: Negative.  Negative for fever, chills, diaphoresis, activity change, appetite change, fatigue and unexpected weight change.  HENT: Negative for hearing loss, ear pain, nosebleeds, congestion, sore throat, facial swelling, rhinorrhea, sneezing, mouth sores, trouble swallowing, neck pain, neck stiffness, postnasal drip, sinus pressure and tinnitus.   Eyes: Negative for pain, discharge, redness, itching and visual disturbance.  Respiratory: Negative for cough, chest tightness, shortness of breath, wheezing and stridor.   Cardiovascular: Negative for chest pain, palpitations and leg swelling.  Gastrointestinal: Negative for nausea, diarrhea, constipation, blood in stool, abdominal distention, anal bleeding and rectal pain.  Genitourinary: Negative for dysuria, urgency, frequency, hematuria, flank pain, vaginal bleeding, vaginal discharge, difficulty urinating, genital sores and pelvic pain.  Musculoskeletal: Negative for back pain, joint swelling, arthralgias and gait problem.  Skin: Negative.  Negative for rash.  Neurological: Negative for dizziness, tremors, seizures, syncope, speech difficulty, weakness, numbness and headaches.  Hematological: Negative for adenopathy. Does not bruise/bleed easily.  Psychiatric/Behavioral: Negative for suicidal ideas, behavioral problems, sleep disturbance, dysphoric mood and decreased concentration. The patient is not nervous/anxious.    Wt Readings from Last 3 Encounters:  10/07/11 150 lb (68.04 kg)  09/11/11 155 lb 12.8 oz (70.67 kg)  08/05/11 153  lb 9.6 oz (69.673 kg)   BP Readings from Last 3 Encounters:  10/07/11 148/76  09/11/11 124/74  08/05/11 134/80        Objective:   Physical Exam  Constitutional: She appears well-developed and well-nourished. No distress.  HENT:  Head: Normocephalic.  Right Ear: External ear normal.  Left Ear: External ear normal.  Nose: Nose normal.  Mouth/Throat: Oropharynx is clear and moist.  Eyes: Conjunctivae are normal. Pupils are equal, round, and reactive to light. Right eye exhibits no discharge. Left eye exhibits no discharge.  Neck: Normal range of motion. Neck supple. No JVD present. No tracheal deviation present. No thyromegaly present.  Cardiovascular: Normal rate, regular rhythm and normal heart sounds.   Pulmonary/Chest: No stridor. No respiratory distress. She has no wheezes.  Abdominal: Soft. Bowel sounds are normal. She exhibits no distension and no mass. There is no tenderness. There is no rebound and no guarding.  Musculoskeletal: She exhibits tenderness. She exhibits no edema.       B radial wrists with tender firm swelling  Lymphadenopathy:    She has no cervical adenopathy.  Neurological: She displays normal reflexes. No cranial nerve deficit. She exhibits normal muscle tone. Coordination normal.  Skin: No rash noted. No erythema. No pallor.  Psychiatric: She has a normal mood and affect. Her behavior is normal. Judgment and thought content normal.   Lab Results  Component Value Date   WBC 5.1 09/11/2011   HGB 11.2* 09/11/2011   HCT 32.2* 09/11/2011   PLT 359 09/11/2011   GLUCOSE 124* 09/11/2011   CHOL 145 10/06/2010   TRIG 91.0 10/06/2010   HDL 31.70* 10/06/2010   LDLCALC 95 10/06/2010   ALT <8 09/11/2011   AST 16 09/11/2011   NA 141 09/11/2011   K 3.8 09/11/2011   CL 104  09/11/2011   CREATININE 0.98 09/11/2011   BUN 12 09/11/2011   CO2 24 09/11/2011   TSH 1.02 10/06/2010   HGBA1C 6.2 12/16/2009          Assessment & Plan:

## 2011-10-07 NOTE — Assessment & Plan Note (Signed)
Dr Arline Asp Using Hydrea Probable essential thrombocythemia; JAK 2 V617F, JAK 2 exon 12 and MPL 515 mutations all negaitve,  BM 5/19/11suggestive of a myeloproliferative disorder.

## 2011-10-07 NOTE — Assessment & Plan Note (Signed)
  On diet  

## 2011-10-07 NOTE — Assessment & Plan Note (Signed)
Chronic - hands and other See Medso

## 2011-10-07 NOTE — Assessment & Plan Note (Signed)
Triamcinolone bid 

## 2011-10-09 ENCOUNTER — Other Ambulatory Visit (HOSPITAL_BASED_OUTPATIENT_CLINIC_OR_DEPARTMENT_OTHER): Payer: Medicare Other | Admitting: Lab

## 2011-10-09 DIAGNOSIS — D759 Disease of blood and blood-forming organs, unspecified: Secondary | ICD-10-CM

## 2011-10-09 LAB — CBC WITH DIFFERENTIAL/PLATELET
Basophils Absolute: 0 10*3/uL (ref 0.0–0.1)
EOS%: 0.6 % (ref 0.0–7.0)
HCT: 33.2 % — ABNORMAL LOW (ref 34.8–46.6)
HGB: 11.4 g/dL — ABNORMAL LOW (ref 11.6–15.9)
LYMPH%: 34.5 % (ref 14.0–49.7)
MCH: 38.4 pg — ABNORMAL HIGH (ref 25.1–34.0)
MCV: 111.3 fL — ABNORMAL HIGH (ref 79.5–101.0)
MONO%: 9 % (ref 0.0–14.0)
NEUT%: 55.4 % (ref 38.4–76.8)
Platelets: 398 10*3/uL (ref 145–400)

## 2011-10-13 ENCOUNTER — Other Ambulatory Visit: Payer: Self-pay | Admitting: Internal Medicine

## 2011-10-13 DIAGNOSIS — Z1231 Encounter for screening mammogram for malignant neoplasm of breast: Secondary | ICD-10-CM

## 2011-10-29 ENCOUNTER — Other Ambulatory Visit: Payer: Self-pay | Admitting: Internal Medicine

## 2011-10-30 ENCOUNTER — Ambulatory Visit: Payer: Medicare Other

## 2011-11-02 ENCOUNTER — Ambulatory Visit
Admission: RE | Admit: 2011-11-02 | Discharge: 2011-11-02 | Disposition: A | Discharge status: Home / Self Care | Payer: Medicare Other | Source: Ambulatory Visit | Attending: Internal Medicine | Admitting: Internal Medicine

## 2011-11-02 DIAGNOSIS — Z1231 Encounter for screening mammogram for malignant neoplasm of breast: Secondary | ICD-10-CM

## 2011-11-06 ENCOUNTER — Other Ambulatory Visit: Payer: Medicare Other | Admitting: Lab

## 2011-11-10 ENCOUNTER — Telehealth: Payer: Self-pay | Admitting: Oncology

## 2011-11-10 NOTE — Telephone Encounter (Signed)
pt had l/m to r/s 5/10 lab,l/m for pt to ret. call   aom

## 2011-11-10 NOTE — Telephone Encounter (Signed)
r/s 5/10 lab to 5/15  aom

## 2011-11-11 ENCOUNTER — Other Ambulatory Visit (HOSPITAL_BASED_OUTPATIENT_CLINIC_OR_DEPARTMENT_OTHER): Payer: Medicare Other | Admitting: Lab

## 2011-11-11 DIAGNOSIS — D759 Disease of blood and blood-forming organs, unspecified: Secondary | ICD-10-CM

## 2011-11-11 DIAGNOSIS — D473 Essential (hemorrhagic) thrombocythemia: Secondary | ICD-10-CM

## 2011-11-11 LAB — CBC WITH DIFFERENTIAL/PLATELET
Basophils Absolute: 0 10*3/uL (ref 0.0–0.1)
EOS%: 0.8 % (ref 0.0–7.0)
HCT: 33.4 % — ABNORMAL LOW (ref 34.8–46.6)
HGB: 11.5 g/dL — ABNORMAL LOW (ref 11.6–15.9)
MCH: 38.3 pg — ABNORMAL HIGH (ref 25.1–34.0)
MCV: 111.3 fL — ABNORMAL HIGH (ref 79.5–101.0)
MONO%: 8.4 % (ref 0.0–14.0)
NEUT%: 60.1 % (ref 38.4–76.8)
Platelets: 375 10*3/uL (ref 145–400)

## 2011-11-12 ENCOUNTER — Telehealth: Payer: Self-pay | Admitting: Oncology

## 2011-11-12 NOTE — Telephone Encounter (Signed)
pt had called to r/s 6/7 to 6/6   aom

## 2011-12-03 ENCOUNTER — Other Ambulatory Visit (HOSPITAL_BASED_OUTPATIENT_CLINIC_OR_DEPARTMENT_OTHER): Payer: Medicare Other | Admitting: Lab

## 2011-12-03 DIAGNOSIS — D473 Essential (hemorrhagic) thrombocythemia: Secondary | ICD-10-CM

## 2011-12-03 DIAGNOSIS — D759 Disease of blood and blood-forming organs, unspecified: Secondary | ICD-10-CM

## 2011-12-03 LAB — CBC WITH DIFFERENTIAL/PLATELET
Eosinophils Absolute: 0.1 10*3/uL (ref 0.0–0.5)
LYMPH%: 36.8 % (ref 14.0–49.7)
MCHC: 34.7 g/dL (ref 31.5–36.0)
MCV: 111.2 fL — ABNORMAL HIGH (ref 79.5–101.0)
MONO%: 11.2 % (ref 0.0–14.0)
NEUT#: 2.4 10*3/uL (ref 1.5–6.5)
Platelets: 402 10*3/uL — ABNORMAL HIGH (ref 145–400)
RBC: 3.05 10*6/uL — ABNORMAL LOW (ref 3.70–5.45)
nRBC: 0 % (ref 0–0)

## 2011-12-04 ENCOUNTER — Other Ambulatory Visit: Payer: Medicare Other | Admitting: Lab

## 2012-01-08 ENCOUNTER — Telehealth: Payer: Self-pay | Admitting: Oncology

## 2012-01-08 ENCOUNTER — Other Ambulatory Visit: Payer: Medicare Other | Admitting: Lab

## 2012-01-08 ENCOUNTER — Ambulatory Visit (HOSPITAL_BASED_OUTPATIENT_CLINIC_OR_DEPARTMENT_OTHER): Payer: Medicare Other | Admitting: Oncology

## 2012-01-08 ENCOUNTER — Encounter: Payer: Self-pay | Admitting: Oncology

## 2012-01-08 VITALS — BP 111/62 | HR 75 | Temp 98.9°F | Ht 63.0 in | Wt 151.9 lb

## 2012-01-08 DIAGNOSIS — D759 Disease of blood and blood-forming organs, unspecified: Secondary | ICD-10-CM

## 2012-01-08 DIAGNOSIS — D473 Essential (hemorrhagic) thrombocythemia: Secondary | ICD-10-CM

## 2012-01-08 LAB — COMPREHENSIVE METABOLIC PANEL
ALT: <8 U/L (ref 0–35)
AST: 16 U/L (ref 0–37)
Alkaline Phosphatase: 43 U/L (ref 39–117)
Creatinine, Ser: 0.84 mg/dL (ref 0.50–1.10)
Total Bilirubin: 0.5 mg/dL (ref 0.3–1.2)

## 2012-01-08 LAB — CBC WITH DIFFERENTIAL/PLATELET
BASO%: 0.7 % (ref 0.0–2.0)
EOS%: 1.2 % (ref 0.0–7.0)
HCT: 34.3 % — ABNORMAL LOW (ref 34.8–46.6)
LYMPH%: 35.9 % (ref 14.0–49.7)
MCH: 37.9 pg — ABNORMAL HIGH (ref 25.1–34.0)
MCHC: 33.7 g/dL (ref 31.5–36.0)
MCV: 112.3 fL — ABNORMAL HIGH (ref 79.5–101.0)
MONO%: 7.5 % (ref 0.0–14.0)
NEUT%: 54.7 % (ref 38.4–76.8)
lymph#: 1.5 10*3/uL (ref 0.9–3.3)

## 2012-01-08 NOTE — Progress Notes (Signed)
This office note has been dictated.  #[id number]

## 2012-01-08 NOTE — Telephone Encounter (Signed)
gve the pt her sept,nov and jan 2014 appt calendars

## 2012-01-08 NOTE — Telephone Encounter (Signed)
x

## 2012-01-08 NOTE — Progress Notes (Signed)
CC:   Georgina Quint. [name], MD   PROBLEM LIST:  1. Essential thrombocythemia with thrombocytosis going back to 2000.  Mutations for JAK2 V617F, Exon 12, and MPL515 were negative. The  patient had a bone marrow on 11/14/2009 which showed slightly  hypercellular marrow for the patient's age, along with abundant  iron stores. There was minimal reticulin fibrosis. Trichrome  stains failed to show any definite collagenous fibrosis. The  patient is maintained on hydroxyurea and aspirin since mid June  2011.  2. Right retinal artery occlusion and subsequent retinal infarct on  05/22/2009. The patient's vision has returned to near normal.  3. Peripheral vascular disease involving the right carotid artery  detected 08/05/2009.  4. Hypertension.  5. Allergic rhinitis.  6. History of asthma.  7. Adenomatous colonic polyps.  8. Diverticulosis.    MEDICATIONS:  1. Albuterol inhaled 2 puffs every 4 hours as needed.  2. Aspirin 325 mg daily.  3. Lotensin/hydrochlorothiazide 20/25 one daily.  4. Symbicort 80/4.5 inhale 2 puffs twice daily.  5. Cholecalciferol 1000 units daily.  6. Lasix 40 mg daily.  7. Hydroxyurea 500 mg twice a day on Mondays and Thursdays and the  other 5 days of the week 500 mg daily, i.e. 9 capsules per week. Hydroxyurea was started in mid June 2011.  8. Claritin 10 mg daily as needed.  9. Ultram 50 mg every 8 hours as needed.     HISTORY:  I saw Der Gagliano today for followup of her presumed essential thrombocythemia currently maintained on aspirin and hydroxyurea.  Ms. Checo was last seen by [place] on 09/11/2011.  She has been coming in every month for CBCs and her platelet counts have been fairly well-maintained in the 350-400,000 range.  Ms. Closser denies any problems, specifically bleeding, bruising or thrombotic episodes.  She generally feels well.  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  PHYSICAL EXAM:  She looks well.   Weight is 151.9 pounds.  Height 5 feet 3 inches.  Body surface area 1.75 m2.  Blood pressure 111/62.  Other vital signs are normal.  There is no scleral icterus.  Mouth and pharynx are benign.  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  No peripheral adenopathy palpable.  Heart and lungs are normal.  Breasts are not examined.  The patient just had mammograms carried out on 11/02/2011 that were negative.  Abdomen:  Benign with no organomegaly or masses palpable. Extremities:  No peripheral edema, clubbing, petechiae or purpura.  She has either cysts or lipomas over her wrist area bilaterally.  Neurologic exam is grossly normal.  The patient states that her vision is stable with corrective glasses.  No Port-A-Cath or peripheral line.  LABORATORY DATA:  Today, white count 4.1, ANC 2.2, hemoglobin 11.6, hematocrit 34.3, platelets 362,000.  Platelet count on 06/06 was 402,000 and on 11/11/2011 375,000.  Chemistries from 09/11/2011 were normal except for a glucose of 124.  BUN was 12, creatinine 0.98.  Albumin 4.5 and LDH 165.  IMAGING STUDIES:  1. Ultrasound of the abdomen on 10/04/2009 showed normal spleen size  with a length of 6.2 cm, normal echogenicity, and no focal  abnormalities.  2. Digital screening mammogram on 10/27/2010 was negative. 3. Digital screening mammogram on 11/02/2011 was negative.   PROCEDURES:  Colonoscopy carried out on 05/24/2007 apparently showed adenomatous polyps.  IMPRESSION AND PLAN:  Ms. Dworkin seems to be doing quite well at the present time with good control of her platelet  count.  She has had no thrombotic or bleeding problems.  She will stay on the same dose of hydroxyurea, specifically 500 mg twice daily on Mondays and Thursdays and 500 mg daily the other 5 days of the week.  She takes 9 capsules of hydroxyurea per week.  She is also on aspirin 325 mg daily.  We will check CBC every 2 months and plan to see Ms. Goltz again in 6 months at which time we will  check CBC and chemistries.  She can see the mid level.  I note that the patient's last colonoscopy was over 4-1/2 years ago and that she had adenomatous polyps.  I have urged her to contact either Dr. Posey Rea or Dr. Yancey Flemings regarding her next colonoscopy.  The patient was given a copy of her CBC today.   ______________________________ Samul Dada, M.D. DSM/MEDQ  D:  01/08/2012  T:  01/08/2012  Job:  [id number]

## 2012-02-04 ENCOUNTER — Ambulatory Visit (INDEPENDENT_AMBULATORY_CARE_PROVIDER_SITE_OTHER): Payer: Medicare Other | Admitting: Endocrinology

## 2012-02-04 ENCOUNTER — Encounter: Payer: Self-pay | Admitting: Endocrinology

## 2012-02-04 ENCOUNTER — Telehealth: Payer: Self-pay | Admitting: Internal Medicine

## 2012-02-04 VITALS — BP 124/72 | HR 84 | Temp 98.7°F | Wt 152.0 lb

## 2012-02-04 DIAGNOSIS — J069 Acute upper respiratory infection, unspecified: Secondary | ICD-10-CM

## 2012-02-04 MED ORDER — CEFUROXIME AXETIL 250 MG PO TABS: 250.0000 mg | ORAL_TABLET | Freq: Two times a day (BID) | ORAL | Status: AC

## 2012-02-04 NOTE — Patient Instructions (Addendum)
i have sent a prescription to your pharmacy, for an antibiotic I hope you feel better soon.  If you don't feel better by next week, please call back.  Please call sooner if you get worse. 

## 2012-02-04 NOTE — Telephone Encounter (Signed)
Caller: Paula Griffith/Patient; PCP: Sonda Primes; CB#: [phone];  Call regarding Congestion, Occasional Dry Cough, Earache and Facial Pressure- Onset 01/31/12. Has hx Asthma and has been out of Simbacort- Started With Shortness of Breath with activity- Onset 02/03/12 No wheezing. Using ProAir as directed.  Yellowish nasal discharge today-02/04/12. She thinks she may have fever, felt warm this morning but Took 2 ES Tylenol 250 mgs at 0930 02/04/12- feels better now. Triage and Care advice per URI Protocol and appnt advise within 24 hours for "facial pain, frontal headache, yellow-green nasal discharge AND any temp elevation". Appnt scheduled for 02/04/12 @ 1530.

## 2012-02-04 NOTE — Progress Notes (Signed)
Subjective:    Patient ID: Paula Griffith, female    DOB: Jul 26, 1945, 66 y.o.   MRN: [id number]  HPI Pt states 1 week of slight dry-quality cough in the chest, and assoc pain at both ears.    Past Medical History  Diagnosis Date  . Colon polyps   . Diverticulosis of colon   . HTN (hypertension)   . LBP (low back pain)   . Osteoarthritis   . Asthma   . Allergic rhinitis   . Glucose intolerance (impaired glucose tolerance)   . PVD (peripheral vascular disease) 2011    Right Carotid Dr Edilia Bo  . 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 Thrombocytopathy 2011    Dr Arline Asp    Past Surgical History  Procedure Date  . Abdominal hysterectomy 1989    History   Social History  . Marital Status: Widowed    Spouse Name: N/A    Number of Children: N/A  . Years of Education: N/A   Occupational History  . Retired     but [employer]   Social History Main Topics  . Smoking status: Never Smoker   . Smokeless tobacco: Not on file  . Alcohol Use: No  . Drug Use: No  . Sexually Active: Not on file   Other Topics Concern  . Not on file   Social History Narrative  . No narrative on file    Current Outpatient Prescriptions on File Prior to Visit  Medication Sig Dispense Refill  . albuterol (PROAIR HFA) 108 (90 BASE) MCG/ACT inhaler Inhale 2 puffs into the lungs every 4 (four) hours as needed. For shortness of breath  1 Inhaler  11  . aspirin 325 MG EC tablet Take 325 mg by mouth daily after breakfast.        . benazepril-hydrochlorthiazide (LOTENSIN HCT) 20-25 MG per tablet Take 1 tablet by mouth daily.  90 tablet  3  . budesonide-formoterol (SYMBICORT) 80-4.5 MCG/ACT inhaler Inhale 2 puffs into the lungs 2 (two) times daily.  1 Inhaler  11  . Cholecalciferol 1000 UNITS tablet Take 1,000 Units by mouth daily.        . Glucosamine-Chondroit-Vit C-Mn (GLUCOSAMINE 1500 COMPLEX PO) Take by mouth.      . hydroxyurea (HYDREA) 500 MG capsule Take 500 mg by mouth as directed. Mon & Thurs- take  2 pills.  All other days take one pill.      . loratadine (CLARITIN) 10 MG tablet Take 1 tablet (10 mg total) by mouth daily.  90 tablet  3  . meclizine (ANTIVERT) 12.5 MG tablet 1-2 tablets as needed daily for dizziness       . traMADol (ULTRAM) 50 MG tablet TAKE ONE TABLET BY MOUTH EVERY 8 HOURS AS NEEDED FOR PAIN  100 tablet  0  . furosemide (LASIX) 40 MG tablet Take 40 mg by mouth every morning. As needed for swelling         Allergies  Allergen Reactions  . Codeine Nausea Only    Family History  Problem Relation Age of Onset  . Coronary artery disease      Female 1st degree relative Female <60  . Lung cancer    . Pancreatic cancer Mother   . Arthritis Mother     RA  . Early death Father 10    MI  . Diabetes Sister     BP 124/72  Pulse 84  Temp 98.7 F (37.1 C) (Oral)  Wt 152 lb (68.947 kg)  SpO2 96%  Review of Systems Denies fever, but she has nasal congestion.      Objective:   Physical Exam VITAL SIGNS:  See vs page GENERAL: no distress head: no deformity eyes: no periorbital swelling, no proptosis external nose and ears are normal mouth: no lesion seen Both eac's and tm's are normal.   NECK: There is no palpable thyroid enlargement.  No thyroid nodule is palpable.  No palpable lymphadenopathy at the anterior neck. LUNGS:  Clear to auscultation.       Assessment & Plan:  Glenford Peers, new

## 2012-03-11 ENCOUNTER — Other Ambulatory Visit (HOSPITAL_BASED_OUTPATIENT_CLINIC_OR_DEPARTMENT_OTHER): Payer: Medicare Other | Admitting: Lab

## 2012-03-11 DIAGNOSIS — D759 Disease of blood and blood-forming organs, unspecified: Secondary | ICD-10-CM

## 2012-03-11 LAB — CBC WITH DIFFERENTIAL/PLATELET
Basophils Absolute: 0 10*3/uL (ref 0.0–0.1)
EOS%: 1.2 % (ref 0.0–7.0)
Eosinophils Absolute: 0.1 10*3/uL (ref 0.0–0.5)
HCT: 32.5 % — ABNORMAL LOW (ref 34.8–46.6)
HGB: 11.2 g/dL — ABNORMAL LOW (ref 11.6–15.9)
MCH: 38.8 pg — ABNORMAL HIGH (ref 25.1–34.0)
MCV: 112.5 fL — ABNORMAL HIGH (ref 79.5–101.0)
MONO%: 10 % (ref 0.0–14.0)
NEUT#: 2.1 10*3/uL (ref 1.5–6.5)
NEUT%: 47.1 % (ref 38.4–76.8)

## 2012-04-08 ENCOUNTER — Ambulatory Visit (INDEPENDENT_AMBULATORY_CARE_PROVIDER_SITE_OTHER)
Admission: RE | Admit: 2012-04-08 | Discharge: 2012-04-08 | Disposition: A | Discharge status: Home / Self Care | Payer: Medicare Other | Source: Ambulatory Visit | Attending: Internal Medicine | Admitting: Internal Medicine

## 2012-04-08 ENCOUNTER — Ambulatory Visit (INDEPENDENT_AMBULATORY_CARE_PROVIDER_SITE_OTHER): Payer: Medicare Other | Admitting: Internal Medicine

## 2012-04-08 ENCOUNTER — Encounter: Payer: Self-pay | Admitting: Internal Medicine

## 2012-04-08 VITALS — BP 130/72 | HR 68 | Temp 98.7°F | Resp 16 | Wt 154.0 lb

## 2012-04-08 DIAGNOSIS — I10 Essential (primary) hypertension: Secondary | ICD-10-CM

## 2012-04-08 DIAGNOSIS — R209 Unspecified disturbances of skin sensation: Secondary | ICD-10-CM

## 2012-04-08 DIAGNOSIS — E559 Vitamin D deficiency, unspecified: Secondary | ICD-10-CM

## 2012-04-08 DIAGNOSIS — J309 Allergic rhinitis, unspecified: Secondary | ICD-10-CM

## 2012-04-08 DIAGNOSIS — D759 Disease of blood and blood-forming organs, unspecified: Secondary | ICD-10-CM

## 2012-04-08 DIAGNOSIS — J45909 Unspecified asthma, uncomplicated: Secondary | ICD-10-CM

## 2012-04-08 DIAGNOSIS — M1990 Unspecified osteoarthritis, unspecified site: Secondary | ICD-10-CM

## 2012-04-08 DIAGNOSIS — R202 Paresthesia of skin: Secondary | ICD-10-CM

## 2012-04-08 DIAGNOSIS — Z23 Encounter for immunization: Secondary | ICD-10-CM

## 2012-04-08 MED ORDER — LORATADINE 10 MG PO TABS: 10.0000 mg | ORAL_TABLET | Freq: Every day | ORAL | Status: DC

## 2012-04-08 MED ORDER — LOSARTAN POTASSIUM-HCTZ 100-25 MG PO TABS: 1.0000 | ORAL_TABLET | Freq: Every day | ORAL | Status: DC

## 2012-04-08 MED ORDER — PREDNISONE 10 MG PO TABS: ORAL_TABLET | ORAL | Status: DC

## 2012-04-08 NOTE — Assessment & Plan Note (Signed)
Continue with current prescription therapy as reflected on the Med list. Labs Claritin

## 2012-04-08 NOTE — Assessment & Plan Note (Signed)
Wose. R/o RA Labs

## 2012-04-08 NOTE — Assessment & Plan Note (Signed)
Continue with current prescription therapy as reflected on the Med list.  

## 2012-04-08 NOTE — Assessment & Plan Note (Signed)
Chronic  10/13 - worse Continue with current prescription therapy as reflected on the Med list.

## 2012-04-08 NOTE — Progress Notes (Signed)
Subjective:    Patient ID: ILO Griffith, female    DOB: 12/09/1945, 66 y.o.   MRN: [id number]  HPI    The patient is here to follow up on chronic thrombocythosis, headaches and chronic moderate LBP and B wrist arthritis symptoms not controlled with medicines, diet and exercise. C/o R knee pain.C/o dry skin on knuckles.  Review of Systems  Constitutional: Negative.  Negative for fever, chills, diaphoresis, activity change, appetite change, fatigue and unexpected weight change.  HENT: Negative for hearing loss, ear pain, nosebleeds, congestion, sore throat, facial swelling, rhinorrhea, sneezing, mouth sores, trouble swallowing, neck pain, neck stiffness, postnasal drip, sinus pressure and tinnitus.   Eyes: Negative for pain, discharge, redness, itching and visual disturbance.  Respiratory: Negative for cough, chest tightness, shortness of breath, wheezing and stridor.   Cardiovascular: Negative for chest pain, palpitations and leg swelling.  Gastrointestinal: Negative for nausea, diarrhea, constipation, blood in stool, abdominal distention, anal bleeding and rectal pain.  Genitourinary: Negative for dysuria, urgency, frequency, hematuria, flank pain, vaginal bleeding, vaginal discharge, difficulty urinating, genital sores and pelvic pain.  Musculoskeletal: Negative for back pain, joint swelling, arthralgias and gait problem.  Skin: Negative.  Negative for rash.  Neurological: Negative for dizziness, tremors, seizures, syncope, speech difficulty, weakness, numbness and headaches.  Hematological: Negative for adenopathy. Does not bruise/bleed easily.  Psychiatric/Behavioral: Negative for suicidal ideas, behavioral problems, disturbed wake/sleep cycle, dysphoric mood and decreased concentration. The patient is not nervous/anxious.    Wt Readings from Last 3 Encounters:  04/08/12 154 lb (69.854 kg)  02/04/12 152 lb (68.947 kg)  01/08/12 151 lb 14.4 oz (68.901 kg)   BP Readings from Last 3  Encounters:  04/08/12 130/72  02/04/12 124/72  01/08/12 111/62        Objective:   Physical Exam  Constitutional: She appears well-developed and well-nourished. No distress.  HENT:  Head: Normocephalic.  Right Ear: External ear normal.  Left Ear: External ear normal.  Nose: Nose normal.  Mouth/Throat: Oropharynx is clear and moist.  Eyes: Conjunctivae normal are normal. Pupils are equal, round, and reactive to light. Right eye exhibits no discharge. Left eye exhibits no discharge.  Neck: Normal range of motion. Neck supple. No JVD present. No tracheal deviation present. No thyromegaly present.  Cardiovascular: Normal rate, regular rhythm and normal heart sounds.   Pulmonary/Chest: No stridor. No respiratory distress. She has no wheezes.  Abdominal: Soft. Bowel sounds are normal. She exhibits no distension and no mass. There is no tenderness. There is no rebound and no guarding.  Musculoskeletal: She exhibits tenderness. She exhibits no edema.       B radial wrists with tender firm swelling  Lymphadenopathy:    She has no cervical adenopathy.  Neurological: She displays normal reflexes. No cranial nerve deficit. She exhibits normal muscle tone. Coordination normal.  Skin: No rash noted. No erythema. No pallor.  Psychiatric: She has a normal mood and affect. Her behavior is normal. Judgment and thought content normal.   Lab Results  Component Value Date   WBC 4.5 03/11/2012   HGB 11.2* 03/11/2012   HCT 32.5* 03/11/2012   PLT 326 03/11/2012   GLUCOSE 136* 01/08/2012   CHOL 145 10/06/2010   TRIG 91.0 10/06/2010   HDL 31.70* 10/06/2010   LDLCALC 95 10/06/2010   ALT <8 01/08/2012   AST 16 01/08/2012   NA 139 01/08/2012   K 4.0 01/08/2012   CL 103 01/08/2012   CREATININE 0.84 01/08/2012   BUN 12 01/08/2012  CO2 27 01/08/2012   TSH 1.02 10/06/2010   HGBA1C 6.2 12/16/2009          Assessment & Plan:    A complex case

## 2012-04-10 ENCOUNTER — Telehealth: Payer: Self-pay | Admitting: Internal Medicine

## 2012-04-10 NOTE — Telephone Encounter (Signed)
Paula Griffith, please, inform patient that her CXR was ok; hand Xray revealed OA Thx

## 2012-04-11 NOTE — Telephone Encounter (Signed)
Pt informed

## 2012-04-22 ENCOUNTER — Other Ambulatory Visit: Payer: Self-pay | Admitting: Internal Medicine

## 2012-04-22 ENCOUNTER — Other Ambulatory Visit (INDEPENDENT_AMBULATORY_CARE_PROVIDER_SITE_OTHER): Payer: Medicare Other

## 2012-04-22 ENCOUNTER — Encounter: Payer: Self-pay | Admitting: Internal Medicine

## 2012-04-22 ENCOUNTER — Ambulatory Visit (INDEPENDENT_AMBULATORY_CARE_PROVIDER_SITE_OTHER): Payer: Medicare Other | Admitting: Internal Medicine

## 2012-04-22 VITALS — BP 138/70 | HR 80 | Temp 98.1°F | Resp 16 | Wt 156.0 lb

## 2012-04-22 DIAGNOSIS — M19039 Primary osteoarthritis, unspecified wrist: Secondary | ICD-10-CM

## 2012-04-22 DIAGNOSIS — J45909 Unspecified asthma, uncomplicated: Secondary | ICD-10-CM

## 2012-04-22 DIAGNOSIS — E559 Vitamin D deficiency, unspecified: Secondary | ICD-10-CM

## 2012-04-22 DIAGNOSIS — I10 Essential (primary) hypertension: Secondary | ICD-10-CM

## 2012-04-22 DIAGNOSIS — M1990 Unspecified osteoarthritis, unspecified site: Secondary | ICD-10-CM

## 2012-04-22 DIAGNOSIS — R209 Unspecified disturbances of skin sensation: Secondary | ICD-10-CM

## 2012-04-22 DIAGNOSIS — M545 Low back pain, unspecified: Secondary | ICD-10-CM

## 2012-04-22 DIAGNOSIS — R202 Paresthesia of skin: Secondary | ICD-10-CM

## 2012-04-22 DIAGNOSIS — D759 Disease of blood and blood-forming organs, unspecified: Secondary | ICD-10-CM

## 2012-04-22 DIAGNOSIS — J309 Allergic rhinitis, unspecified: Secondary | ICD-10-CM

## 2012-04-22 DIAGNOSIS — Z23 Encounter for immunization: Secondary | ICD-10-CM

## 2012-04-22 LAB — LIPID PANEL
LDL Cholesterol: 88 mg/dL (ref 0–99)
Total CHOL/HDL Ratio: 4
Triglycerides: 105 mg/dL (ref 0.0–149.0)

## 2012-04-22 LAB — CBC WITH DIFFERENTIAL/PLATELET
Basophils Absolute: 0 10*3/uL (ref 0.0–0.1)
Basophils Relative: 0.5 % (ref 0.0–3.0)
Eosinophils Relative: 1.9 % (ref 0.0–5.0)
HCT: 35.9 % — ABNORMAL LOW (ref 36.0–46.0)
Hemoglobin: 11.8 g/dL — ABNORMAL LOW (ref 12.0–15.0)
Lymphocytes Relative: 38.4 % (ref 12.0–46.0)
Lymphs Abs: 1.5 10*3/uL (ref 0.7–4.0)
Monocytes Relative: 7.5 % (ref 3.0–12.0)
Neutro Abs: 2 10*3/uL (ref 1.4–7.7)
RBC: 3.12 Mil/uL — ABNORMAL LOW (ref 3.87–5.11)
WBC: 3.9 10*3/uL — ABNORMAL LOW (ref 4.5–10.5)

## 2012-04-22 LAB — HEPATIC FUNCTION PANEL
ALT: 8 U/L (ref 0–35)
AST: 18 U/L (ref 0–37)
Bilirubin, Direct: 0.1 mg/dL (ref 0.0–0.3)
Total Bilirubin: 0.7 mg/dL (ref 0.3–1.2)
Total Protein: 7.9 g/dL (ref 6.0–8.3)

## 2012-04-22 LAB — BASIC METABOLIC PANEL
CO2: 26 mEq/L (ref 19–32)
Calcium: 9.4 mg/dL (ref 8.4–10.5)
Chloride: 103 mEq/L (ref 96–112)
Creatinine, Ser: 0.9 mg/dL (ref 0.4–1.2)
Glucose, Bld: 99 mg/dL (ref 70–99)

## 2012-04-22 LAB — VITAMIN B12: Vitamin B-12: 295 pg/mL (ref 211–911)

## 2012-04-22 MED ORDER — PREDNISONE 10 MG PO TABS: ORAL_TABLET | ORAL | Status: DC

## 2012-04-22 NOTE — Progress Notes (Signed)
Subjective:    Patient ID: Paula Griffith, female    DOB: [birth year], 66 y.o.   MRN: [id number]  HPI    The patient is here to follow up on chronic thrombocythosis, headaches and chronic moderate LBP and B wrist arthritis symptoms not controlled with medicines, diet and exercise. C/o R knee pain.C/o dry skin on knuckles. She did not take Prednisone for ?reason  Review of Systems  Constitutional: Negative.  Negative for fever, chills, diaphoresis, activity change, appetite change, fatigue and unexpected weight change.  HENT: Negative for hearing loss, ear pain, nosebleeds, congestion, sore throat, facial swelling, rhinorrhea, sneezing, mouth sores, trouble swallowing, neck pain, neck stiffness, postnasal drip, sinus pressure and tinnitus.   Eyes: Negative for pain, discharge, redness, itching and visual disturbance.  Respiratory: Negative for cough, chest tightness, shortness of breath, wheezing and stridor.   Cardiovascular: Negative for chest pain, palpitations and leg swelling.  Gastrointestinal: Negative for nausea, diarrhea, constipation, blood in stool, abdominal distention, anal bleeding and rectal pain.  Genitourinary: Negative for dysuria, urgency, frequency, hematuria, flank pain, vaginal bleeding, vaginal discharge, difficulty urinating, genital sores and pelvic pain.  Musculoskeletal: Negative for back pain, joint swelling, arthralgias and gait problem.  Skin: Negative.  Negative for rash.  Neurological: Negative for dizziness, tremors, seizures, syncope, speech difficulty, weakness, numbness and headaches.  Hematological: Negative for adenopathy. Does not bruise/bleed easily.  Psychiatric/Behavioral: Negative for suicidal ideas, behavioral problems, disturbed wake/sleep cycle, dysphoric mood and decreased concentration. The patient is not nervous/anxious.    Wt Readings from Last 3 Encounters:  04/22/12 156 lb (70.761 kg)  04/08/12 154 lb (69.854 kg)  02/04/12 152 lb (68.947  kg)   BP Readings from Last 3 Encounters:  04/22/12 138/70  04/08/12 130/72  02/04/12 124/72        Objective:   Physical Exam  Constitutional: She appears well-developed and well-nourished. No distress.  HENT:  Head: Normocephalic.  Right Ear: External ear normal.  Left Ear: External ear normal.  Nose: Nose normal.  Mouth/Throat: Oropharynx is clear and moist.  Eyes: Conjunctivae normal are normal. Pupils are equal, round, and reactive to light. Right eye exhibits no discharge. Left eye exhibits no discharge.  Neck: Normal range of motion. Neck supple. No JVD present. No tracheal deviation present. No thyromegaly present.  Cardiovascular: Normal rate, regular rhythm and normal heart sounds.   Pulmonary/Chest: No stridor. No respiratory distress. She has no wheezes.  Abdominal: Soft. Bowel sounds are normal. She exhibits no distension and no mass. There is no tenderness. There is no rebound and no guarding.  Musculoskeletal: She exhibits tenderness. She exhibits no edema.       B radial wrists with tender firm swelling  Lymphadenopathy:    She has no cervical adenopathy.  Neurological: She displays normal reflexes. No cranial nerve deficit. She exhibits normal muscle tone. Coordination normal.  Skin: No rash noted. No erythema. No pallor.  Psychiatric: She has a normal mood and affect. Her behavior is normal. Judgment and thought content normal.   Lab Results  Component Value Date   WBC 4.5 03/11/2012   HGB 11.2* 03/11/2012   HCT 32.5* 03/11/2012   PLT 326 03/11/2012   GLUCOSE 136* 01/08/2012   CHOL 145 10/06/2010   TRIG 91.0 10/06/2010   HDL 31.70* 10/06/2010   LDLCALC 95 10/06/2010   ALT <8 01/08/2012   AST 16 01/08/2012   NA 139 01/08/2012   K 4.0 01/08/2012   CL 103 01/08/2012   CREATININE 0.84 01/08/2012  BUN 12 01/08/2012   CO2 27 01/08/2012   TSH 1.02 10/06/2010   HGBA1C 6.2 12/16/2009          Assessment & Plan:

## 2012-04-22 NOTE — Assessment & Plan Note (Signed)
Continue with current prescription therapy as reflected on the Med list.  

## 2012-04-22 NOTE — Assessment & Plan Note (Signed)
Bilateral 10/13 -- OA Labs pending Prednisone 10 mg: take 4 tabs a day x 3 days; then 3 tabs a day x 4 days; then 2 tabs a day x 4 days, then 1 tab a day x 6 days, then stop. Take pc.

## 2012-04-22 NOTE — Assessment & Plan Note (Signed)
Labs

## 2012-04-22 NOTE — Assessment & Plan Note (Signed)
She did not take prednisone and did no do labs - asked to take predn and do labs

## 2012-04-23 LAB — RHEUMATOID FACTOR: Rhuematoid fact SerPl-aCnc: <10 IU/mL (ref <=14)

## 2012-04-23 LAB — VITAMIN D 25 HYDROXY (VIT D DEFICIENCY, FRACTURES): Vit D, 25-Hydroxy: 56 ng/mL (ref 30–89)

## 2012-04-25 LAB — ALLERGEN FOOD PROFILE SPECIFIC IGE
Chicken IgE: <0.1 kU/L
Corn: <0.1 kU/L
Fish Cod: <0.1 kU/L
IgE (Immunoglobulin E), Serum: 21.7 IU/mL (ref 0.0–180.0)
Orange: <0.1 kU/L
Peanut IgE: 0.3 kU/L — ABNORMAL HIGH
Soybean IgE: <0.1 kU/L
Tuna IgE: <0.1 kU/L

## 2012-04-26 LAB — ALLERGY PROFILE REGION II-DC, DE, MD, ~~LOC~~, VA
Allergen, D pternoyssinus,d7: <0.1 kU/L
Alternaria Alternata: <0.1 kU/L
Aspergillus fumigatus, IgG: <0.1 kU/L
Box Elder IgE: <0.1 kU/L
Cat Dander: <0.1 kU/L
Cockroach: <0.1 kU/L
Dog Dander: <0.1 kU/L
Elm IgE: <0.1 kU/L
IgE (Immunoglobulin E), Serum: 21.2 IU/mL (ref 0.0–180.0)
Johnson Grass: 0.33 kU/L — ABNORMAL HIGH

## 2012-05-13 ENCOUNTER — Other Ambulatory Visit (HOSPITAL_BASED_OUTPATIENT_CLINIC_OR_DEPARTMENT_OTHER): Payer: Medicare Other | Admitting: Lab

## 2012-05-13 DIAGNOSIS — D759 Disease of blood and blood-forming organs, unspecified: Secondary | ICD-10-CM

## 2012-05-13 LAB — CBC WITH DIFFERENTIAL/PLATELET
BASO%: 0.7 % (ref 0.0–2.0)
Basophils Absolute: 0 10*3/uL (ref 0.0–0.1)
EOS%: 0.7 % (ref 0.0–7.0)
HCT: 34.1 % — ABNORMAL LOW (ref 34.8–46.6)
MCH: 39.7 pg — ABNORMAL HIGH (ref 25.1–34.0)
MONO#: 0.6 10*3/uL (ref 0.1–0.9)
NEUT#: 3.4 10*3/uL (ref 1.5–6.5)
RDW: 12.3 % (ref 11.2–14.5)
lymph#: 1.4 10*3/uL (ref 0.9–3.3)

## 2012-05-18 LAB — IGG FOOD PANEL
Allergen, Milk, IgG: 22.5 ug/mL — ABNORMAL HIGH (ref <0.15)
Corn, IgG: <0.15 ug/mL (ref <0.15)
Egg yolk, IgG: 7.9 ug/mL — ABNORMAL HIGH (ref <2.0)
Peanut, IgG: 3.35 ug/mL — ABNORMAL HIGH (ref <0.15)

## 2012-05-19 ENCOUNTER — Encounter: Payer: Self-pay | Admitting: Internal Medicine

## 2012-06-03 ENCOUNTER — Encounter: Payer: Self-pay | Admitting: Internal Medicine

## 2012-06-03 ENCOUNTER — Ambulatory Visit (INDEPENDENT_AMBULATORY_CARE_PROVIDER_SITE_OTHER): Payer: Medicare Other | Admitting: Internal Medicine

## 2012-06-03 VITALS — BP 140/70 | HR 80 | Temp 99.3°F | Resp 16 | Wt 155.0 lb

## 2012-06-03 DIAGNOSIS — I10 Essential (primary) hypertension: Secondary | ICD-10-CM

## 2012-06-03 DIAGNOSIS — M545 Low back pain, unspecified: Secondary | ICD-10-CM

## 2012-06-03 DIAGNOSIS — R739 Hyperglycemia, unspecified: Secondary | ICD-10-CM

## 2012-06-03 DIAGNOSIS — M1990 Unspecified osteoarthritis, unspecified site: Secondary | ICD-10-CM

## 2012-06-03 DIAGNOSIS — R7309 Other abnormal glucose: Secondary | ICD-10-CM

## 2012-06-03 DIAGNOSIS — F519 Sleep disorder not due to a substance or known physiological condition, unspecified: Secondary | ICD-10-CM

## 2012-06-03 MED ORDER — IBUPROFEN 600 MG PO TABS: 600.0000 mg | ORAL_TABLET | Freq: Three times a day (TID) | ORAL | Status: DC | PRN

## 2012-06-03 MED ORDER — HYDROCODONE-ACETAMINOPHEN 5-325 MG PO TABS: 1.0000 | ORAL_TABLET | Freq: Three times a day (TID) | ORAL | Status: DC | PRN

## 2012-06-03 NOTE — Assessment & Plan Note (Signed)
Watching labs 

## 2012-06-03 NOTE — Assessment & Plan Note (Signed)
Will try Hydrocodone 

## 2012-06-03 NOTE — Assessment & Plan Note (Signed)
Continue with current prescription therapy as reflected on the Med list.  

## 2012-06-03 NOTE — Progress Notes (Signed)
Subjective:    Patient ID: Paula Griffith, female    DOB: January 23, 1946, 66 y.o.   MRN: [id number]  HPI    The patient is here to follow up on chronic thrombocythosis, headaches and chronic moderate LBP and B wrist arthritis symptoms not controlled with medicines - worse. C/o R knee, R hip, thigh pain. She did take Prednisone this time - it helped. Pain is 10/10 at night. Tylenol is not helping...  Review of Systems  Constitutional: Negative.  Negative for fever, chills, diaphoresis, activity change, appetite change, fatigue and unexpected weight change.  HENT: Negative for hearing loss, ear pain, nosebleeds, congestion, sore throat, facial swelling, rhinorrhea, sneezing, mouth sores, trouble swallowing, neck pain, neck stiffness, postnasal drip, sinus pressure and tinnitus.   Eyes: Negative for pain, discharge, redness, itching and visual disturbance.  Respiratory: Negative for cough, chest tightness, shortness of breath, wheezing and stridor.   Cardiovascular: Negative for chest pain, palpitations and leg swelling.  Gastrointestinal: Negative for nausea, diarrhea, constipation, blood in stool, abdominal distention, anal bleeding and rectal pain.  Genitourinary: Negative for dysuria, urgency, frequency, hematuria, flank pain, vaginal bleeding, vaginal discharge, difficulty urinating, genital sores and pelvic pain.  Musculoskeletal: Negative for back pain, joint swelling, arthralgias and gait problem.  Skin: Negative.  Negative for rash.  Neurological: Negative for dizziness, tremors, seizures, syncope, speech difficulty, weakness, numbness and headaches.  Hematological: Negative for adenopathy. Does not bruise/bleed easily.  Psychiatric/Behavioral: Negative for suicidal ideas, behavioral problems, sleep disturbance, dysphoric mood and decreased concentration. The patient is not nervous/anxious.    Wt Readings from Last 3 Encounters:  06/03/12 155 lb (70.308 kg)  04/22/12 156 lb (70.761 kg)   04/08/12 154 lb (69.854 kg)   BP Readings from Last 3 Encounters:  06/03/12 140/70  04/22/12 138/70  04/08/12 130/72        Objective:   Physical Exam  Constitutional: She appears well-developed and well-nourished. No distress.  HENT:  Head: Normocephalic.  Right Ear: External ear normal.  Left Ear: External ear normal.  Nose: Nose normal.  Mouth/Throat: Oropharynx is clear and moist.  Eyes: Conjunctivae normal are normal. Pupils are equal, round, and reactive to light. Right eye exhibits no discharge. Left eye exhibits no discharge.  Neck: Normal range of motion. Neck supple. No JVD present. No tracheal deviation present. No thyromegaly present.  Cardiovascular: Normal rate, regular rhythm and normal heart sounds.   Pulmonary/Chest: No stridor. No respiratory distress. She has no wheezes.  Abdominal: Soft. Bowel sounds are normal. She exhibits no distension and no mass. There is no tenderness. There is no rebound and no guarding.  Musculoskeletal: She exhibits tenderness. She exhibits no edema.       B radial wrists with tender firm swelling Cane LS, B hips are tender  Lymphadenopathy:    She has no cervical adenopathy.  Neurological: She displays normal reflexes. No cranial nerve deficit. She exhibits normal muscle tone. Coordination normal.  Skin: No rash noted. No erythema. No pallor.  Psychiatric: She has a normal mood and affect. Her behavior is normal. Judgment and thought content normal.   Lab Results  Component Value Date   WBC 5.4 05/13/2012   HGB 11.9 05/13/2012   HCT 34.1* 05/13/2012   PLT 291 05/13/2012   GLUCOSE 99 04/22/2012   CHOL 145 04/22/2012   TRIG 105.0 04/22/2012   HDL 36.50* 04/22/2012   LDLCALC 88 04/22/2012   ALT 8 04/22/2012   AST 18 04/22/2012   NA 138 04/22/2012  K 3.7 04/22/2012   CL 103 04/22/2012   CREATININE 0.9 04/22/2012   BUN 18 04/22/2012   CO2 26 04/22/2012   TSH 0.59 04/22/2012   HGBA1C 6.2 12/16/2009           Assessment & Plan:

## 2012-06-03 NOTE — Assessment & Plan Note (Signed)
Will try Hydrocodone

## 2012-06-14 ENCOUNTER — Encounter: Payer: Self-pay | Admitting: Internal Medicine

## 2012-07-08 ENCOUNTER — Encounter: Payer: Self-pay | Admitting: Family

## 2012-07-08 ENCOUNTER — Ambulatory Visit: Payer: Medicare Other | Admitting: Family

## 2012-07-08 ENCOUNTER — Telehealth: Payer: Self-pay | Admitting: Oncology

## 2012-07-08 ENCOUNTER — Other Ambulatory Visit (HOSPITAL_BASED_OUTPATIENT_CLINIC_OR_DEPARTMENT_OTHER): Payer: Medicare Other | Admitting: Lab

## 2012-07-08 ENCOUNTER — Ambulatory Visit (HOSPITAL_BASED_OUTPATIENT_CLINIC_OR_DEPARTMENT_OTHER): Payer: Medicare Other | Admitting: Family

## 2012-07-08 ENCOUNTER — Other Ambulatory Visit: Payer: Medicare Other | Admitting: Lab

## 2012-07-08 VITALS — BP 146/78 | HR 81 | Temp 98.7°F | Resp 20 | Ht 63.0 in | Wt 157.3 lb

## 2012-07-08 DIAGNOSIS — D759 Disease of blood and blood-forming organs, unspecified: Secondary | ICD-10-CM

## 2012-07-08 DIAGNOSIS — D473 Essential (hemorrhagic) thrombocythemia: Secondary | ICD-10-CM

## 2012-07-08 LAB — COMPREHENSIVE METABOLIC PANEL (CC13)
ALT: <6 U/L (ref 0–55)
Alkaline Phosphatase: 60 U/L (ref 40–150)
CO2: 26 mEq/L (ref 22–29)
Creatinine: 0.8 mg/dL (ref 0.6–1.1)
Sodium: 142 mEq/L (ref 136–145)
Total Bilirubin: 0.42 mg/dL (ref 0.20–1.20)

## 2012-07-08 LAB — CBC WITH DIFFERENTIAL/PLATELET
Basophils Absolute: 0 10*3/uL (ref 0.0–0.1)
Eosinophils Absolute: 0.1 10*3/uL (ref 0.0–0.5)
HCT: 33.9 % — ABNORMAL LOW (ref 34.8–46.6)
HGB: 11.8 g/dL (ref 11.6–15.9)
NEUT#: 1.9 10*3/uL (ref 1.5–6.5)
NEUT%: 43.1 % (ref 38.4–76.8)
RDW: 12.2 % (ref 11.2–14.5)
lymph#: 1.9 10*3/uL (ref 0.9–3.3)

## 2012-07-08 LAB — LACTATE DEHYDROGENASE (CC13): LDH: 195 U/L (ref 125–245)

## 2012-07-08 NOTE — Telephone Encounter (Signed)
gv and printed appt schedule for pt for April and July...the patient ok and aware

## 2012-07-08 NOTE — Progress Notes (Signed)
Patient ID: Paula Griffith, female   DOB: February 14, 1946, 67 y.o.   MRN: [id number] CSN: [id number]  CC: Paula Quint. [name], MD  Problem List: Paula Griffith is a 67 y.o. African-American female with a problem list consisting of:  1. Essential thrombocythemia with thrombocytosis going back to 2000. Mutations for JAK2 V617F, Exon 12, and MPL515 were negative. The patient had a bone marrow on 11/14/2009 which showed slightly hypercellular marrow for the patient's age, along with abundant iron stores. There was minimal reticulin fibrosis. Trichrome stains failed to show any definite collagenous fibrosis. The  patient is maintained on hydroxyurea and aspirin since mid June 2011.  2. Right retinal artery occlusion and subsequent retinal infarct on 05/22/2009. The patient's vision has returned to near normal.  3. Peripheral vascular disease involving the right carotid artery detected 08/05/2009.  4. Hypertension 5. Allergic rhinitis 6. History of asthma 7. Adenomatous colonic polyps. 8. Diverticulosis  Dr. Arline Asp and I saw [name] today for follow up of her presumed essential thrombocythemia currently maintained on aspirin and hydroxyurea. Paula Griffith was last seen by [place] on 01/08/2012. She has been coming in every 2 months for CBCs and her platelet counts have been fairly well-maintained in the 200-300 range. Paula Griffith denies any major symptomatology including any unusual bleeding, bruising or thrombotic episodes.  She does report occasional wrist pain, but states the pain is tolerable.  She generally feels well and her eyesight has not diminished. 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  Past Medical History: Past Medical History  Diagnosis Date  . Colon polyps   . Diverticulosis of colon   . HTN (hypertension)   . LBP (low back pain)   . Osteoarthritis   . Asthma   . Allergic rhinitis   . Glucose intolerance (impaired glucose tolerance)   . PVD  (peripheral vascular disease) 2011    Right Carotid Dr Edilia Bo  . 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 Thrombocytopathy 2011    Dr Arline Asp    Surgical History: Past Surgical History  Procedure Date  . Abdominal hysterectomy 1989    Current Medications: Current Outpatient Prescriptions  Medication Sig Dispense Refill  . albuterol (PROAIR HFA) 108 (90 BASE) MCG/ACT inhaler Inhale 2 puffs into the lungs every 4 (four) hours as needed. For shortness of breath  1 Inhaler  11  . aspirin 325 MG EC tablet Take 325 mg by mouth daily after breakfast.        . budesonide-formoterol (SYMBICORT) 80-4.5 MCG/ACT inhaler Inhale 2 puffs into the lungs 2 (two) times daily.  1 Inhaler  11  . Cholecalciferol 1000 UNITS tablet Take 1,000 Units by mouth daily.        . furosemide (LASIX) 40 MG tablet Take 40 mg by mouth every morning. As needed for swelling       . Glucosamine-Chondroit-Vit C-Mn (GLUCOSAMINE 1500 COMPLEX PO) Take by mouth.      Marland Kitchen HYDROcodone-acetaminophen (NORCO/VICODIN) 5-325 MG per tablet Take 1 tablet by mouth every 8 (eight) hours as needed for pain.  90 tablet  2  . hydroxyurea (HYDREA) 500 MG capsule Take 500 mg by mouth as directed. Mon & Thurs- take 2 pills.  All other days take one pill.      Marland Kitchen ibuprofen (ADVIL,MOTRIN) 600 MG tablet Take 1 tablet (600 mg total) by mouth every 8 (eight) hours as needed for pain.  60 tablet  3  . loratadine (CLARITIN)  10 MG tablet Take 1 tablet (10 mg total) by mouth daily.  90 tablet  3  . losartan-hydrochlorothiazide (HYZAAR) 100-25 MG per tablet Take 1 tablet by mouth daily.  90 tablet  3  . meclizine (ANTIVERT) 12.5 MG tablet 1-2 tablets as needed daily for dizziness         Allergies: Allergies  Allergen Reactions  . Benazepril     cough  . Codeine Nausea Only  . Tramadol     Dizzy, nauseated    Family History: Family History  Problem Relation Age of Onset  . Coronary artery disease      Female 1st degree relative Female <60  .  Lung cancer    . Pancreatic cancer Mother   . Arthritis Mother     RA  . Early death Father 69    MI  . Diabetes Sister     Social History: History  Substance Use Topics  . Smoking status: Never Smoker   . Smokeless tobacco: Never Used  . Alcohol Use: No    Review of Systems: 10 Point review of systems was completed and is negative except as noted above.   Physical Exam:   Blood pressure 146/78, pulse 81, temperature 98.7 F (37.1 C), temperature source Oral, resp. rate 20, height 5\' 3"  (1.6 m), weight 157 lb 4.8 oz (71.351 kg).  General appearance: Alert, cooperative, well nourished, no apparent distress Head: Normocephalic, without obvious abnormality, atraumatic Eyes: Conjunctivae/corneas clear, PERRLA, EOMI, wears corrective lenses Nose: Nares, septum and mucosa are normal, no drainage or sinus tenderness Neck: No adenopathy, supple, symmetrical, trachea midline, thyroid not enlarged, no tenderness Resp: Clear to auscultation bilaterally Cardio: Regular rate and rhythm, S1, S2 normal, no murmur, click, rub or gallop GI: Soft, distended, non-tender, hypoactive bowel sounds, no organomegaly Extremities: RUE has wrist cysts/lipomas - non-tender, LUE has large well-healed scar, all other extremities are normal, atraumatic, no cyanosis or edema Lymph nodes: Cervical, supraclavicular, and axillary nodes normal Neurologic: Grossly normal   Laboratory Data: Results for orders placed in visit on 07/08/12 (from the past 48 hour(s))  CBC WITH DIFFERENTIAL     Status: Abnormal   Collection Time   07/08/12  8:38 AM      Component Value Range Comment   WBC 4.4  3.9 - 10.3 10e3/uL    NEUT# 1.9  1.5 - 6.5 10e3/uL    HGB 11.8  11.6 - 15.9 g/dL    HCT 40.9 (*) 81.1 - 46.6 %    Platelets 377  145 - 400 10e3/uL    MCV 110.2 (*) 79.5 - 101.0 fL    MCH 38.3 (*) 25.1 - 34.0 pg    MCHC 34.8  31.5 - 36.0 g/dL    RBC 9.14 (*) 7.82 - 5.45 10e6/uL    RDW 12.2  11.2 - 14.5 %    lymph# 1.9   0.9 - 3.3 10e3/uL    MONO# 0.5  0.1 - 0.9 10e3/uL    Eosinophils Absolute 0.1  0.0 - 0.5 10e3/uL    Basophils Absolute 0.0  0.0 - 0.1 10e3/uL    NEUT% 43.1  38.4 - 76.8 %    LYMPH% 43.7  14.0 - 49.7 %    MONO% 10.3  0.0 - 14.0 %    EOS% 2.1  0.0 - 7.0 %    BASO% 0.8  0.0 - 2.0 %   COMPREHENSIVE METABOLIC PANEL (CC13)     Status: Abnormal   Collection Time   07/08/12  8:38 AM      Component Value Range Comment   Sodium 142  136 - 145 mEq/L    Potassium 3.6  3.5 - 5.1 mEq/L    Chloride 105  98 - 107 mEq/L    CO2 26  22 - 29 mEq/L    Glucose 118 (*) 70 - 99 mg/dl    BUN 65.7  7.0 - 84.6 mg/dL    Creatinine 0.8  0.6 - 1.1 mg/dL    Total Bilirubin 9.62  0.20 - 1.20 mg/dL    Alkaline Phosphatase 60  40 - 150 U/L    AST 16  5 - 34 U/L    ALT <6 Repeated and Verified  0 - 55 U/L    Total Protein 7.3  6.4 - 8.3 g/dL    Albumin 3.7  3.5 - 5.0 g/dL    Calcium 9.5  8.4 - 95.2 mg/dL   LACTATE DEHYDROGENASE (CC13)     Status: Normal   Collection Time   07/08/12  8:38 AM      Component Value Range Comment   LDH 195  125 - 245 U/L      Imaging Studies: 1. Ultrasound of the abdomen on 10/04/2009 showed normal spleen size with a length of 6.2 cm, normal echogenicity, and no focal  abnormalities.  2. Digital screening mammogram on 10/27/2010 was negative.  3. Digital screening mammogram on 11/02/2011 was negative. 4. Chest x-ray 2 view on 04/08/2012 showed no active cardiopulmonary disease. 5. Complete right hand x-ray on 04/08/2012 showed osteoarthritis of the right wrist and hand, most pronounced along  the radial aspect of the wrist and first CMC joint.   Procedures: Colonoscopy carried out on 05/24/2007 apparently showed adenomatous polyps.  She has another colonoscopy scheduled for 07/26/2012.   Impression/Plan: Paula Griffith seems to be doing very well at the present time with good control of her platelet count. She has had no thrombotic or bleeding problems. She will stay on the same  dose of Hydroxyurea, which is 500 mg twice daily on Mondays and Thursdays and 500 mg daily the other 5 days of the week. She takes 9 capsules of Hydroxyurea per week. She is also on Aspirin 325 mg daily. We will  check CBC in 3 months and plan to see Paula Griffith again in 6 months at which time we will check CBC and chemistries. Paula Griffith is encouraged to contact us in the interim if she has any questions or concerns.    Larina Bras, NP-C 07/08/2012, 9:50 AM

## 2012-07-08 NOTE — Patient Instructions (Addendum)
Please contact us at [phone] if you have any questions or concerns.   Results for orders placed in visit on 07/08/12 (from the past 24 hour(s))  CBC WITH DIFFERENTIAL     Status: Abnormal   Collection Time   07/08/12  8:38 AM      Component Value Range   WBC 4.4  3.9 - 10.3 10e3/uL   NEUT# 1.9  1.5 - 6.5 10e3/uL   HGB 11.8  11.6 - 15.9 g/dL   HCT 16.1 (*) 09.6 - 04.5 %   Platelets 377  145 - 400 10e3/uL   MCV 110.2 (*) 79.5 - 101.0 fL   MCH 38.3 (*) 25.1 - 34.0 pg   MCHC 34.8  31.5 - 36.0 g/dL   RBC 4.09 (*) 8.11 - 9.14 10e6/uL   RDW 12.2  11.2 - 14.5 %   lymph# 1.9  0.9 - 3.3 10e3/uL   MONO# 0.5  0.1 - 0.9 10e3/uL   Eosinophils Absolute 0.1  0.0 - 0.5 10e3/uL   Basophils Absolute 0.0  0.0 - 0.1 10e3/uL   NEUT% 43.1  38.4 - 76.8 %   LYMPH% 43.7  14.0 - 49.7 %   MONO% 10.3  0.0 - 14.0 %   EOS% 2.1  0.0 - 7.0 %   BASO% 0.8  0.0 - 2.0 %   Narrative:    Performed At:  [facility]               [address]               [place], [place] [address]  COMPREHENSIVE METABOLIC PANEL (CC13)     Status: Abnormal   Collection Time   07/08/12  8:38 AM      Component Value Range   Sodium 142  136 - 145 mEq/L   Potassium 3.6  3.5 - 5.1 mEq/L   Chloride 105  98 - 107 mEq/L   CO2 26  22 - 29 mEq/L   Glucose 118 (*) 70 - 99 mg/dl   BUN 62.1  7.0 - 30.8 mg/dL   Creatinine 0.8  0.6 - 1.1 mg/dL   Total Bilirubin 6.57  0.20 - 1.20 mg/dL   Alkaline Phosphatase 60  40 - 150 U/L   AST 16  5 - 34 U/L   ALT <6 Repeated and Verified  0 - 55 U/L   Total Protein 7.3  6.4 - 8.3 g/dL   Albumin 3.7  3.5 - 5.0 g/dL   Calcium 9.5  8.4 - 84.6 mg/dL   Narrative:    Performed At:  [facility]               [address]               [place], [place] [address]  LACTATE DEHYDROGENASE (CC13)     Status: Normal   Collection Time   07/08/12  8:38 AM      Component Value Range   LDH 195  125 - 245 U/L   Narrative:    Performed At:  [facility]               [address]               [place], [place] [address]

## 2012-07-12 ENCOUNTER — Ambulatory Visit (AMBULATORY_SURGERY_CENTER): Payer: Medicare Other | Admitting: *Deleted

## 2012-07-12 VITALS — Ht 63.5 in | Wt 158.0 lb

## 2012-07-12 DIAGNOSIS — Z1211 Encounter for screening for malignant neoplasm of colon: Secondary | ICD-10-CM

## 2012-07-12 MED ORDER — MOVIPREP 100 G PO SOLR: ORAL | Status: DC

## 2012-07-15 ENCOUNTER — Telehealth: Payer: Self-pay | Admitting: Oncology

## 2012-07-15 ENCOUNTER — Other Ambulatory Visit: Payer: Self-pay | Admitting: Medical Oncology

## 2012-07-18 ENCOUNTER — Telehealth: Payer: Self-pay

## 2012-07-18 ENCOUNTER — Telehealth: Payer: Self-pay | Admitting: Oncology

## 2012-07-18 NOTE — Telephone Encounter (Signed)
Pt lvm at 828 am about a medication refill. I s/w pt and confirmed it was the hydrea. It was e-scribed to [facility] on 1/17. Pt will check with [facility] and call us back if they did not receive this. I confirmed 845 am lab appt on 01/06/13, also.

## 2012-07-18 NOTE — Telephone Encounter (Signed)
s.w pt and she is aware of her lab on 7/11

## 2012-07-26 ENCOUNTER — Encounter: Payer: Self-pay | Admitting: Internal Medicine

## 2012-07-26 ENCOUNTER — Ambulatory Visit (AMBULATORY_SURGERY_CENTER): Payer: Medicare Other | Admitting: Internal Medicine

## 2012-07-26 VITALS — BP 131/73 | HR 74 | Temp 98.8°F | Resp 16 | Ht 63.0 in | Wt 158.0 lb

## 2012-07-26 DIAGNOSIS — Z86010 Personal history of colonic polyps: Secondary | ICD-10-CM

## 2012-07-26 DIAGNOSIS — K5730 Diverticulosis of large intestine without perforation or abscess without bleeding: Secondary | ICD-10-CM

## 2012-07-26 DIAGNOSIS — Z1211 Encounter for screening for malignant neoplasm of colon: Secondary | ICD-10-CM

## 2012-07-26 MED ORDER — SODIUM CHLORIDE 0.9 % IV SOLN
500.0000 mL | INTRAVENOUS | Status: DC
Start: 2012-07-26 — End: 2012-07-26

## 2012-07-26 NOTE — Progress Notes (Signed)
Patient did not experience any of the following events: a burn prior to discharge; a fall within the facility; wrong site/side/patient/procedure/implant event; or a hospital transfer or hospital admission upon discharge from the facility. (G8907) Patient did not have preoperative order for IV antibiotic SSI prophylaxis. (G8918)  

## 2012-07-26 NOTE — Patient Instructions (Addendum)
YOU HAD AN ENDOSCOPIC PROCEDURE TODAY AT THE [facility]: Refer to the procedure report that was given to you for any specific questions about what was found during the examination.  If the procedure report does not answer your questions, please call your gastroenterologist to clarify.  If you requested that your care partner not be given the details of your procedure findings, then the procedure report has been included in a sealed envelope for you to review at your convenience later.  YOU SHOULD EXPECT: Some feelings of bloating in the abdomen. Passage of more gas than usual.  Walking can help get rid of the air that was put into your GI tract during the procedure and reduce the bloating. If you had a lower endoscopy (such as a colonoscopy or flexible sigmoidoscopy) you may notice spotting of blood in your stool or on the toilet paper. If you underwent a bowel prep for your procedure, then you may not have a normal bowel movement for a few days.  DIET: Your first meal following the procedure should be a light meal and then it is ok to progress to your normal diet.  A half-sandwich or bowl of soup is an example of a good first meal.  Heavy or fried foods are harder to digest and may make you feel nauseous or bloated.  Likewise meals heavy in dairy and vegetables can cause extra gas to form and this can also increase the bloating.  Drink plenty of fluids but you should avoid alcoholic beverages for 24 hours.  ACTIVITY: Your care partner should take you home directly after the procedure.  You should plan to take it easy, moving slowly for the rest of the day.  You can resume normal activity the day after the procedure however you should NOT DRIVE or use heavy machinery for 24 hours (because of the sedation medicines used during the test).    SYMPTOMS TO REPORT IMMEDIATELY: A gastroenterologist can be reached at any hour.  During normal business hours, 8:30 AM to 5:00 PM Monday through Friday,  call [phone].  After hours and on weekends, please call the GI answering service at [phone] who will take a message and have the physician on call contact you.   Following lower endoscopy (colonoscopy or flexible sigmoidoscopy):  Excessive amounts of blood in the stool  Significant tenderness or worsening of abdominal pains  Swelling of the abdomen that is new, acute  Fever of 100F or higher  FOLLOW UP: If any biopsies were taken you will be contacted by phone or by letter within the next 1-3 weeks.  Call your gastroenterologist if you have not heard about the biopsies in 3 weeks.  Our staff will call the home number listed on your records the next business day following your procedure to check on you and address any questions or concerns that you may have at that time regarding the information given to you following your procedure. This is a courtesy call and so if there is no answer at the home number and we have not heard from you through the emergency physician on call, we will assume that you have returned to your regular daily activities without incident.  SIGNATURES/CONFIDENTIALITY: You and/or your care partner have signed paperwork which will be entered into your electronic medical record.  These signatures attest to the fact that that the information above on your After Visit Summary has been reviewed and is understood.  Full responsibility of the confidentiality of this   discharge information lies with you and/or your care-partner.  Resume medications. Information given on diverticulosis and high fiber diet with discharge instructions. 

## 2012-07-26 NOTE — Op Note (Signed)
[facility] [address] [place], [address]   COLONOSCOPY PROCEDURE REPORT  PATIENT: Paula, Griffith  MR#: [id number] BIRTHDATE: 30-May-1946 , 66  yrs. old GENDER: Female ENDOSCOPIST: Roxy Cedar, MD REFERRED NW:[id number] Program Recall PROCEDURE DATE:  07/26/2012 PROCEDURE:   Colonoscopy, surveillance ASA CLASS:   Class III INDICATIONS:Patient's personal history of adenomatous colon polyps. 2002 nonadvanced adenoma; 2008 negative MEDICATIONS: MAC sedation, administered by CRNA and propofol (Diprivan) 300mg  IV  DESCRIPTION OF PROCEDURE:   After the risks benefits and alternatives of the procedure were thoroughly explained, informed consent was obtained.  A digital rectal exam revealed no abnormalities of the rectum.   The LB [id number]  endoscope was introduced through the anus and advanced to the cecum, which was identified by both the appendix and ileocecal valve. No adverse events experienced.   The quality of the prep was good, using MoviPrep  The instrument was then slowly withdrawn as the colon was fully examined.      COLON FINDINGS: Moderate diverticulosis was noted in the sigmoid colon.   The colon mucosa was otherwise normal.  Retroflexed views revealed no abnormalities. The time to cecum=2 minutes 24 seconds. Withdrawal time=7 minutes 45 seconds.  The scope was withdrawn and the procedure completed. COMPLICATIONS: There were no complications.  ENDOSCOPIC IMPRESSION: 1.   Moderate diverticulosis was noted in the sigmoid colon 2.   The colon mucosa was otherwise normal  RECOMMENDATIONS: 1. Follow up colonoscopy in 10 years   eSigned:  Roxy Cedar, MD 07/26/2012 9:19 AM   cc: Linda Hedges.  [name], MD and The Patient

## 2012-07-27 ENCOUNTER — Telehealth: Payer: Self-pay | Admitting: *Deleted

## 2012-07-27 NOTE — Telephone Encounter (Signed)
  Follow up Call-  Call back number 07/26/2012  Post procedure Call Back phone  # [phone]  Permission to leave phone message Yes     Patient questions:  Do you have a fever, pain , or abdominal swelling? no Pain Score  0 *  Have you tolerated food without any problems? yes  Have you been able to return to your normal activities? yes  Do you have any questions about your discharge instructions: Diet   no Medications  no Follow up visit  no  Do you have questions or concerns about your Care? no  Actions: * If pain score is 4 or above: No action needed, pain <4.

## 2012-09-02 ENCOUNTER — Ambulatory Visit: Payer: Medicare Other | Admitting: Internal Medicine

## 2012-09-09 ENCOUNTER — Encounter: Payer: Self-pay | Admitting: Internal Medicine

## 2012-09-09 ENCOUNTER — Ambulatory Visit (INDEPENDENT_AMBULATORY_CARE_PROVIDER_SITE_OTHER): Payer: Medicare Other | Admitting: Internal Medicine

## 2012-09-09 VITALS — BP 150/70 | HR 76 | Temp 99.3°F | Resp 16 | Wt 163.0 lb

## 2012-09-09 DIAGNOSIS — M25559 Pain in unspecified hip: Secondary | ICD-10-CM

## 2012-09-09 DIAGNOSIS — M545 Low back pain, unspecified: Secondary | ICD-10-CM

## 2012-09-09 DIAGNOSIS — J45909 Unspecified asthma, uncomplicated: Secondary | ICD-10-CM

## 2012-09-09 DIAGNOSIS — M25551 Pain in right hip: Secondary | ICD-10-CM

## 2012-09-09 MED ORDER — HYDROCODONE-ACETAMINOPHEN 5-325 MG PO TABS: 1.0000 | ORAL_TABLET | Freq: Three times a day (TID) | ORAL | Status: DC | PRN

## 2012-09-09 MED ORDER — IBUPROFEN 600 MG PO TABS: 600.0000 mg | ORAL_TABLET | Freq: Three times a day (TID) | ORAL | Status: DC | PRN

## 2012-09-09 NOTE — Progress Notes (Signed)
Subjective:   HPI    The patient is here to follow up on chronic thrombocythosis, headaches and chronic moderate LBP and B wrist arthritis symptoms not controlled with medicines - worse. C/o R knee, R hip, thigh pain. She did take Prednisone this time - it helped. Pain is 10/10 at night. Tylenol is not helping...  Review of Systems  Constitutional: Negative.  Negative for fever, chills, diaphoresis, activity change, appetite change, fatigue and unexpected weight change.  HENT: Negative for hearing loss, ear pain, nosebleeds, congestion, sore throat, facial swelling, rhinorrhea, sneezing, mouth sores, trouble swallowing, neck pain, neck stiffness, postnasal drip, sinus pressure and tinnitus.   Eyes: Negative for pain, discharge, redness, itching and visual disturbance.  Respiratory: Negative for cough, chest tightness, shortness of breath, wheezing and stridor.   Cardiovascular: Negative for chest pain, palpitations and leg swelling.  Gastrointestinal: Negative for nausea, diarrhea, constipation, blood in stool, abdominal distention, anal bleeding and rectal pain.  Genitourinary: Negative for dysuria, urgency, frequency, hematuria, flank pain, vaginal bleeding, vaginal discharge, difficulty urinating, genital sores and pelvic pain.  Musculoskeletal: Negative for back pain, joint swelling, arthralgias and gait problem.  Skin: Negative.  Negative for rash.  Neurological: Negative for dizziness, tremors, seizures, syncope, speech difficulty, weakness, numbness and headaches.  Hematological: Negative for adenopathy. Does not bruise/bleed easily.  Psychiatric/Behavioral: Negative for suicidal ideas, behavioral problems, sleep disturbance, dysphoric mood and decreased concentration. The patient is not nervous/anxious.    Wt Readings from Last 3 Encounters:  09/09/12 163 lb (73.936 kg)  07/26/12 158 lb (71.668 kg)  07/12/12 158 lb (71.668 kg)   BP Readings from Last 3 Encounters:  09/09/12  150/70  07/26/12 131/73  07/08/12 146/78        Objective:   Physical Exam  Constitutional: She appears well-developed and well-nourished. No distress.  HENT:  Head: Normocephalic.  Right Ear: External ear normal.  Left Ear: External ear normal.  Nose: Nose normal.  Mouth/Throat: Oropharynx is clear and moist.  Eyes: Conjunctivae are normal. Pupils are equal, round, and reactive to light. Right eye exhibits no discharge. Left eye exhibits no discharge.  Neck: Normal range of motion. Neck supple. No JVD present. No tracheal deviation present. No thyromegaly present.  Cardiovascular: Normal rate, regular rhythm and normal heart sounds.   Pulmonary/Chest: No stridor. No respiratory distress. She has no wheezes.  Abdominal: Soft. Bowel sounds are normal. She exhibits no distension and no mass. There is no tenderness. There is no rebound and no guarding.  Musculoskeletal: She exhibits tenderness. She exhibits no edema.  B radial wrists with tender firm swelling Cane LS, B hips are tender  Lymphadenopathy:    She has no cervical adenopathy.  Neurological: She displays normal reflexes. No cranial nerve deficit. She exhibits normal muscle tone. Coordination normal.  Skin: No rash noted. No erythema. No pallor.  Psychiatric: She has a normal mood and affect. Her behavior is normal. Judgment and thought content normal.   Lab Results  Component Value Date   WBC 4.4 07/08/2012   HGB 11.8 07/08/2012   HCT 33.9* 07/08/2012   PLT 377 07/08/2012   GLUCOSE 118* 07/08/2012   CHOL 145 04/22/2012   TRIG 105.0 04/22/2012   HDL 36.50* 04/22/2012   LDLCALC 88 04/22/2012   ALT <6 Repeated and Verified 07/08/2012   AST 16 07/08/2012   NA 142 07/08/2012   K 3.6 07/08/2012   CL 105 07/08/2012   CREATININE 0.8 07/08/2012   BUN 15.0 07/08/2012   CO2  26 07/08/2012   TSH 0.59 04/22/2012   HGBA1C 6.2 12/16/2009        Procedure Note :     Procedure : Joint Injection,  R  hip   Indication:  Trochanteric  bursitis with refractory  chronic pain.   Risks including unsuccessful procedure , bleeding, infection, bruising, skin atrophy and others were explained to the patient in detail as well as the benefits. Informed consent was obtained and signed.   Tthe patient was placed in a comfortable lateral decubitus position. The point of maximal tenderness was identified. Skin was prepped with Betadine and alcohol. Then, a 5 cc syringe with a 2 inch long 24-gauge needle was used for a bursa injection.. The needle was advanced  Into the bursa. I injected the bursa with 4 mL of 2% lidocaine and 80 mg of Depo-Medrol .  Band-Aid was applied.   Tolerated well. Complications: None. Good pain relief following the procedure.     Assessment & Plan:

## 2012-09-09 NOTE — Patient Instructions (Signed)
Postprocedure instructions :    A Band-Aid should be left on for 12 hours. Injection therapy is not a cure itself. It is used in conjunction with other modalities. You can use nonsteroidal anti-inflammatories like ibuprofen , hot and cold compresses. Rest is recommended in the next 24 hours. You need to report immediately  if fever, chills or any signs of infection develop. 

## 2012-09-10 ENCOUNTER — Encounter: Payer: Self-pay | Admitting: Internal Medicine

## 2012-09-10 DIAGNOSIS — G8929 Other chronic pain: Secondary | ICD-10-CM | POA: Insufficient documentation

## 2012-09-10 MED ORDER — METHYLPREDNISOLONE ACETATE 80 MG/ML IJ SUSP: 80.0000 mg | Freq: Once | INTRAMUSCULAR | Status: DC

## 2012-09-10 NOTE — Assessment & Plan Note (Signed)
Will inject Off work 2 wks Stretching adviced

## 2012-09-10 NOTE — Assessment & Plan Note (Signed)
Continue with current prescription therapy as reflected on the Med list.  

## 2012-10-07 ENCOUNTER — Other Ambulatory Visit (HOSPITAL_BASED_OUTPATIENT_CLINIC_OR_DEPARTMENT_OTHER): Payer: Medicare Other | Admitting: Lab

## 2012-10-07 DIAGNOSIS — D759 Disease of blood and blood-forming organs, unspecified: Secondary | ICD-10-CM

## 2012-10-07 LAB — CBC WITH DIFFERENTIAL/PLATELET
BASO%: 0.2 % (ref 0.0–2.0)
EOS%: 0.6 % (ref 0.0–7.0)
LYMPH%: 33.5 % (ref 14.0–49.7)
MCH: 35.8 pg — ABNORMAL HIGH (ref 25.1–34.0)
MCHC: 34.2 g/dL (ref 31.5–36.0)
MONO#: 0.4 10*3/uL (ref 0.1–0.9)
Platelets: 361 10*3/uL (ref 145–400)
RBC: 3.27 10*6/uL — ABNORMAL LOW (ref 3.70–5.45)
WBC: 4.9 10*3/uL (ref 3.9–10.3)

## 2012-10-19 ENCOUNTER — Other Ambulatory Visit: Payer: Self-pay | Admitting: Internal Medicine

## 2012-11-07 ENCOUNTER — Other Ambulatory Visit: Payer: Self-pay

## 2012-11-07 DIAGNOSIS — Z1231 Encounter for screening mammogram for malignant neoplasm of breast: Secondary | ICD-10-CM

## 2012-11-30 ENCOUNTER — Ambulatory Visit
Admission: RE | Admit: 2012-11-30 | Discharge: 2012-11-30 | Disposition: A | Discharge status: Home / Self Care | Payer: Medicare Other | Source: Ambulatory Visit

## 2012-11-30 DIAGNOSIS — Z1231 Encounter for screening mammogram for malignant neoplasm of breast: Secondary | ICD-10-CM

## 2012-12-07 ENCOUNTER — Other Ambulatory Visit: Payer: Self-pay | Admitting: Oncology

## 2012-12-09 ENCOUNTER — Encounter: Payer: Self-pay | Admitting: Internal Medicine

## 2012-12-09 ENCOUNTER — Ambulatory Visit (INDEPENDENT_AMBULATORY_CARE_PROVIDER_SITE_OTHER): Payer: Medicare Other | Admitting: Internal Medicine

## 2012-12-09 VITALS — BP 152/80 | HR 80 | Temp 98.3°F | Resp 16 | Wt 163.0 lb

## 2012-12-09 DIAGNOSIS — J45909 Unspecified asthma, uncomplicated: Secondary | ICD-10-CM

## 2012-12-09 DIAGNOSIS — I10 Essential (primary) hypertension: Secondary | ICD-10-CM

## 2012-12-09 DIAGNOSIS — Z23 Encounter for immunization: Secondary | ICD-10-CM

## 2012-12-09 DIAGNOSIS — M545 Low back pain, unspecified: Secondary | ICD-10-CM

## 2012-12-09 DIAGNOSIS — M25551 Pain in right hip: Secondary | ICD-10-CM

## 2012-12-09 DIAGNOSIS — R739 Hyperglycemia, unspecified: Secondary | ICD-10-CM

## 2012-12-09 DIAGNOSIS — M25559 Pain in unspecified hip: Secondary | ICD-10-CM

## 2012-12-09 DIAGNOSIS — D759 Disease of blood and blood-forming organs, unspecified: Secondary | ICD-10-CM

## 2012-12-09 DIAGNOSIS — R7309 Other abnormal glucose: Secondary | ICD-10-CM

## 2012-12-09 MED ORDER — BUDESONIDE-FORMOTEROL FUMARATE 80-4.5 MCG/ACT IN AERO: 2.0000 | INHALATION_SPRAY | Freq: Two times a day (BID) | RESPIRATORY_TRACT | Status: DC

## 2012-12-09 NOTE — Assessment & Plan Note (Signed)
Continue with current prescription therapy as reflected on the Med list.  

## 2012-12-09 NOTE — Progress Notes (Signed)
Subjective:   HPI  Retired again!  The patient is here to follow up on chronic thrombocythosis, headaches and chronic moderate LBP and B wrist arthritis symptoms not controlled with medicines - worse.   F/u R knee, R hip, thigh pain. She did take Prednisone this time - it helped. Pain is 10/10 at night. Tylenol is not helping...  Review of Systems  Constitutional: Negative.  Negative for fever, chills, diaphoresis, activity change, appetite change, fatigue and unexpected weight change.  HENT: Negative for hearing loss, ear pain, nosebleeds, congestion, sore throat, facial swelling, rhinorrhea, sneezing, mouth sores, trouble swallowing, neck pain, neck stiffness, postnasal drip, sinus pressure and tinnitus.   Eyes: Negative for pain, discharge, redness, itching and visual disturbance.  Respiratory: Negative for cough, chest tightness, shortness of breath, wheezing and stridor.   Cardiovascular: Negative for chest pain, palpitations and leg swelling.  Gastrointestinal: Negative for nausea, diarrhea, constipation, blood in stool, abdominal distention, anal bleeding and rectal pain.  Genitourinary: Negative for dysuria, urgency, frequency, hematuria, flank pain, vaginal bleeding, vaginal discharge, difficulty urinating, genital sores and pelvic pain.  Musculoskeletal: Negative for back pain, joint swelling, arthralgias and gait problem.  Skin: Negative.  Negative for rash.  Neurological: Negative for dizziness, tremors, seizures, syncope, speech difficulty, weakness, numbness and headaches.  Hematological: Negative for adenopathy. Does not bruise/bleed easily.  Psychiatric/Behavioral: Negative for suicidal ideas, behavioral problems, sleep disturbance, dysphoric mood and decreased concentration. The patient is not nervous/anxious.    Wt Readings from Last 3 Encounters:  12/09/12 163 lb (73.936 kg)  09/09/12 163 lb (73.936 kg)  07/26/12 158 lb (71.668 kg)   BP Readings from Last 3  Encounters:  12/09/12 152/80  09/09/12 150/70  07/26/12 131/73        Objective:   Physical Exam  Constitutional: She appears well-developed and well-nourished. No distress.  HENT:  Head: Normocephalic.  Right Ear: External ear normal.  Left Ear: External ear normal.  Nose: Nose normal.  Mouth/Throat: Oropharynx is clear and moist.  Eyes: Conjunctivae are normal. Pupils are equal, round, and reactive to light. Right eye exhibits no discharge. Left eye exhibits no discharge.  Neck: Normal range of motion. Neck supple. No JVD present. No tracheal deviation present. No thyromegaly present.  Cardiovascular: Normal rate, regular rhythm and normal heart sounds.   Pulmonary/Chest: No stridor. No respiratory distress. She has no wheezes.  Abdominal: Soft. Bowel sounds are normal. She exhibits no distension and no mass. There is no tenderness. There is no rebound and no guarding.  Musculoskeletal: She exhibits tenderness. She exhibits no edema.  B radial wrists with tender firm swelling Cane LS, B hips are tender  Lymphadenopathy:    She has no cervical adenopathy.  Neurological: She displays normal reflexes. No cranial nerve deficit. She exhibits normal muscle tone. Coordination normal.  Skin: No rash noted. No erythema. No pallor.  Psychiatric: She has a normal mood and affect. Her behavior is normal. Judgment and thought content normal.   Lab Results  Component Value Date   WBC 4.9 10/07/2012   HGB 11.7 10/07/2012   HCT 34.2* 10/07/2012   PLT 361 10/07/2012   GLUCOSE 118* 07/08/2012   CHOL 145 04/22/2012   TRIG 105.0 04/22/2012   HDL 36.50* 04/22/2012   LDLCALC 88 04/22/2012   ALT <6 Repeated and Verified 07/08/2012   AST 16 07/08/2012   NA 142 07/08/2012   K 3.6 07/08/2012   CL 105 07/08/2012   CREATININE 0.8 07/08/2012   BUN 15.0 07/08/2012  CO2 26 07/08/2012   TSH 0.59 04/22/2012   HGBA1C 6.2 12/16/2009        Assessment & Plan:

## 2012-12-09 NOTE — Assessment & Plan Note (Signed)
Better  

## 2013-01-04 ENCOUNTER — Telehealth: Payer: Self-pay | Admitting: Internal Medicine

## 2013-01-04 NOTE — Telephone Encounter (Signed)
Patient Information:  Caller Name: Paula Griffith  Phone: [phone]  Patient: Paula Griffith, Paula Griffith  Gender: Female  DOB: [birth year]  Age: 67 Years  PCP: [name], Alex (Adults only)  Office Follow Up:  Does the office need to follow up with this patient?: No  Instructions For The Office: N/A   Symptoms  Reason For Call & Symptoms: Paula Griffith states she had an episode of dizziness on 01/03/13. States "she was bent over aand stood up -then room was spinning". States she sat down and drank some juice and symptoms resolved. Has another episode of dizziness at 1500 when she turned around in standing position. States with the dizziness she had pain behind her right eye for a few minutes - rates pain an 8 to 10. Pain resolved. Both eyes now" blood shot red." Has had recurrent eye pains. Has an appointment at 08:15 in AM with [facility]. Per eye pain protocol has see within 2 weeks due to mild recurrent eye pain. Per [facility] dizziness protocol has see provider within 24 hrs due to dizziness that worsens with movement of head and has not been evaluated. Has appt at office in AM. Office staff scheduled appointment prior to transferring to [facility] for triage.  Reviewed Health History In EMR: Yes  Reviewed Medications In EMR: Yes  Reviewed Allergies In EMR: Yes  Reviewed Surgeries / Procedures: Yes  Date of Onset of Symptoms: 01/03/2013  Treatments Tried: Drank Juice and sat down  Treatments Tried Worked: Yes  Guideline(s) Used:  N/A  Disposition Per Guideline:   See Within 2 Weeks in Office  Reason For Disposition Reached:   Mild eye pains are a recurrent problem  Advice Given:  N/A  Patient Will Follow Care Advice:  YES  Appointment Scheduled:  01/05/2013 08:15:00 Appointment Scheduled Provider:  Nicki Reaper

## 2013-01-05 ENCOUNTER — Encounter: Payer: Self-pay | Admitting: Internal Medicine

## 2013-01-05 ENCOUNTER — Telehealth: Payer: Self-pay | Admitting: *Deleted

## 2013-01-05 ENCOUNTER — Ambulatory Visit (INDEPENDENT_AMBULATORY_CARE_PROVIDER_SITE_OTHER): Payer: Medicare Other | Admitting: Internal Medicine

## 2013-01-05 VITALS — BP 150/84 | HR 82 | Temp 97.8°F | Ht 63.0 in | Wt 167.0 lb

## 2013-01-05 DIAGNOSIS — H1133 Conjunctival hemorrhage, bilateral: Secondary | ICD-10-CM

## 2013-01-05 DIAGNOSIS — H1130 Conjunctival hemorrhage, unspecified eye: Secondary | ICD-10-CM

## 2013-01-05 DIAGNOSIS — Z639 Problem related to primary support group, unspecified: Secondary | ICD-10-CM

## 2013-01-05 DIAGNOSIS — F439 Reaction to severe stress, unspecified: Secondary | ICD-10-CM

## 2013-01-05 DIAGNOSIS — I10 Essential (primary) hypertension: Secondary | ICD-10-CM

## 2013-01-05 NOTE — Patient Instructions (Signed)
Subconjunctival Hemorrhage °A subconjunctival hemorrhage is a bright red patch covering a portion of the white of the eye. The white part of the eye is called the sclera, and it is covered by a thin membrane called the conjunctiva. This membrane is clear, except for tiny blood vessels that you can see with the naked eye. When your eye is irritated or inflamed and becomes red, it is because the vessels in the conjunctiva are swollen. °Sometimes, a blood vessel in the conjunctiva can break and bleed. When this occurs, the blood builds up between the conjunctiva and the sclera, and spreads out to create a red area. The red spot may be very small at first. It may then spread to cover a larger part of the surface of the eye, or even all of the visible white part of the eye. °In almost all cases, the blood will go away and the eye will become white again. Before completely dissolving, however, the red area may spread. It may also become brownish-yellow in color, before going away. If a lot of blood collects under the conjunctiva, it may look like a bulge on the surface of the eye. This looks scary, but it will also eventually flatten out and go away. Subconjunctival hemorrhages do not cause pain, but if swollen, may cause a feeling of irritation. There is no effect on vision.  °CAUSES  °· The most common cause is mild trauma (rubbing the eye, irritation). °· Subconjunctival hemorrhages can happen because of coughing or straining (lifting heavy objects), vomiting, or sneezing. °· In some cases, your doctor may want to check your blood pressure. High blood pressure can also cause a sunconjunctival hemorrhage. °· Severe trauma or blunt injuries. °· Diseases that affect blood clotting (hemophilia, leukemia). °· Abnormalities of blood vessels behind the eye (carotid cavernous sinus fistula). °· Tumors behind the eye. °· Certain drugs (aspirin, coumadin, heparin). °· Recent eye surgery. °HOME CARE INSTRUCTIONS  °· Do not worry  about the appearance of your eye. You may continue your usual activities. °· Often, follow-up is not necessary. °SEEK MEDICAL CARE IF:  °· Your eye becomes painful. °· The bleeding does not disappear within 3 weeks. °· Bleeding occurs elsewhere, for example, under the skin, in the mouth, or in the other eye. °· You have recurring subconjunctival hemorrhages. °SEEK IMMEDIATE MEDICAL CARE IF:  °· Your vision changes or you have difficulty seeing. °· You develop severe headache, persistent vomiting, confusion, or abnormal drowsiness (lethargy). °· Your eye seems to bulge or protrude from the eye socket. °· You notice the sudden appearance of bruises, or have spontaneous bleeding elsewhere on your body. °Document Released: 06/15/2005 Document Revised: 09/07/2011 Document Reviewed: 05/13/2009 °ExitCare® Patient Information ©2014 [employer]. ° °

## 2013-01-05 NOTE — Telephone Encounter (Signed)
Pt seen Nicki Reaper, NP today for HTN. She was told to f/u with PCP re: changing BP meds. She is requesting to go back on to Benazepril. Please advise.

## 2013-01-05 NOTE — Progress Notes (Signed)
Subjective:    Patient ID: Paula Griffith, female    DOB: [birth year], 67 y.o.   MRN: [id number]  HPI  Pt presents to the clinic today with c/o headache, dizziness and blood shots eyes. This occurred yesterday. She was at home arguing with her grandson. All of a sudden her head starting hurting and she felt dizzy. She went to go lay down. When she woke up, she felt much better but noticed that her eyes were blood shot. They do not hurt, they are just red. She has had this same reaction in the past while being upset. She denies headache, dizziness, chest pain, chest tightness or shortness of breath.  Review of Systems      Past Medical History  Diagnosis Date  . Colon polyps   . Diverticulosis of colon   . HTN (hypertension)   . LBP (low back pain)   . Osteoarthritis   . Asthma   . Allergic rhinitis   . Glucose intolerance (impaired glucose tolerance)   . PVD (peripheral vascular disease) 2011    Right Carotid Dr Edilia Bo  . 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 Thrombocytopathy 2011    Dr Arline Asp    Current Outpatient Prescriptions  Medication Sig Dispense Refill  . albuterol (PROAIR HFA) 108 (90 BASE) MCG/ACT inhaler Inhale 2 puffs into the lungs every 4 (four) hours as needed. For shortness of breath  1 Inhaler  11  . aspirin 325 MG EC tablet Take 325 mg by mouth daily after breakfast.        . budesonide-formoterol (SYMBICORT) 80-4.5 MCG/ACT inhaler Inhale 2 puffs into the lungs 2 (two) times daily.  10.2 g  5  . Cholecalciferol 1000 UNITS tablet Take 1,000 Units by mouth daily.        . furosemide (LASIX) 40 MG tablet Take 40 mg by mouth every morning. As needed for swelling       . Glucosamine-Chondroit-Vit C-Mn (GLUCOSAMINE 1500 COMPLEX PO) Take by mouth.      Marland Kitchen HYDROcodone-acetaminophen (NORCO/VICODIN) 5-325 MG per tablet Take 1 tablet by mouth every 8 (eight) hours as needed for pain.  90 tablet  0  . hydroxyurea (HYDREA) 500 MG capsule TAKE ONE CAPSULE BY MOUTH TWICE DAILY  FOR 5 DAYS THEN  TAKE  ONE CAPSULE  EVERY  DAY  FOR  2  DAYS  OR  AS  DIRECTED  60 capsule  1  . ibuprofen (ADVIL,MOTRIN) 600 MG tablet Take 1 tablet (600 mg total) by mouth every 8 (eight) hours as needed for pain.  60 tablet  3  . loratadine (CLARITIN) 10 MG tablet Take 1 tablet (10 mg total) by mouth daily.  90 tablet  3  . losartan-hydrochlorothiazide (HYZAAR) 100-25 MG per tablet Take 1 tablet by mouth daily.  90 tablet  3  . meclizine (ANTIVERT) 12.5 MG tablet 1-2 tablets as needed daily for dizziness        Current Facility-Administered Medications  Medication Dose Route Frequency Provider Last Rate Last Dose  . methylPREDNISolone acetate (DEPO-MEDROL) injection 80 mg  80 mg Intra-articular Once Tresa Garter, MD        Allergies  Allergen Reactions  . Benazepril     cough  . Codeine Nausea Only  . Tramadol     Dizzy, nauseated    Family History  Problem Relation Age of Onset  . Coronary artery disease      Female 1st degree relative Female <60  . Lung  cancer    . Pancreatic cancer Mother   . Arthritis Mother     RA  . Early death Father 68    MI  . Diabetes Sister   . Colon cancer Neg Hx   . Stomach cancer Neg Hx     History   Social History  . Marital Status: Widowed    Spouse Name: N/A    Number of Children: N/A  . Years of Education: N/A   Occupational History  . Retired     but [employer]   Social History Main Topics  . Smoking status: Never Smoker   . Smokeless tobacco: Never Used  . Alcohol Use: No  . Drug Use: No  . Sexually Active: Not on file   Other Topics Concern  . Not on file   Social History Narrative  . No narrative on file     Constitutional:  Pt reports headache. Denies fever, malaise, fatigue, or abrupt weight changes.  HEENT: Pt reports eye redness. Denies eye pain,  ear pain, ringing in the ears, wax buildup, runny nose, nasal congestion, bloody nose, or sore throat. Respiratory: Denies difficulty breathing, shortness of  breath, cough or sputum production.   Cardiovascular: Denies chest pain, chest tightness, palpitations or swelling in the hands or feet.  Neurological: Pt reports dizziness. Deniesdifficulty with memory, difficulty with speech or problems with balance and coordination.   No other specific complaints in a complete review of systems (except as listed in HPI above).  Objective:   Physical Exam   BP 150/84  Pulse 82  Temp(Src) 97.8 F (36.6 C) (Oral)  Ht 5\' 3"  (1.6 m)  Wt 167 lb (75.751 kg)  BMI 29.59 kg/m2  SpO2 96% Wt Readings from Last 3 Encounters:  01/05/13 167 lb (75.751 kg)  12/09/12 163 lb (73.936 kg)  09/09/12 163 lb (73.936 kg)    General: Appears her stated age, well developed, well nourished in NAD. HEENT: Head: normal shape and size; Eyes: sclera red with notable bilateral hemorrhage, no icterus, conjunctiva pink, PERRLA and EOMs intact; Ears: Tm's gray and intact, normal light reflex; Nose: mucosa pink and moist, septum midline; Throat/Mouth: Teeth present, mucosa pink and moist, no exudate, lesions or ulcerations noted. .  Cardiovascular: Normal rate and rhythm. S1,S2 noted.  No murmur, rubs or gallops noted. No JVD or BLE edema. No carotid bruits noted. Pulmonary/Chest: Normal effort and positive vesicular breath sounds. No respiratory distress. No wheezes, rales or ronchi noted.  Neurological: Alert and oriented. Cranial nerves II-XII intact. Coordination normal. +DTRs bilaterally.   BMET    Component Value Date/Time   NA 142 07/08/2012 0838   NA 138 04/22/2012 0839   K 3.6 07/08/2012 0838   K 3.7 04/22/2012 0839   CL 105 07/08/2012 0838   CL 103 04/22/2012 0839   CO2 26 07/08/2012 0838   CO2 26 04/22/2012 0839   GLUCOSE 118* 07/08/2012 0838   GLUCOSE 99 04/22/2012 0839   BUN 15.0 07/08/2012 0838   BUN 18 04/22/2012 0839   CREATININE 0.8 07/08/2012 0838   CREATININE 0.9 04/22/2012 0839   CALCIUM 9.5 07/08/2012 0838   CALCIUM 9.4 04/22/2012 0839   GFRNONAA 92.82  12/16/2009 0850   GFRAA 94 10/03/2007 1044    Lipid Panel     Component Value Date/Time   CHOL 145 04/22/2012 0839   TRIG 105.0 04/22/2012 0839   HDL 36.50* 04/22/2012 0839   CHOLHDL 4 04/22/2012 0839   VLDL 21.0 04/22/2012 0839   LDLCALC  88 04/22/2012 0839    CBC    Component Value Date/Time   WBC 4.9 10/07/2012 0852   WBC 3.9* 04/22/2012 0839   RBC 3.27* 10/07/2012 0852   RBC 3.12* 04/22/2012 0839   HGB 11.7 10/07/2012 0852   HGB 11.8* 04/22/2012 0839   HCT 34.2* 10/07/2012 0852   HCT 35.9* 04/22/2012 0839   PLT 361 10/07/2012 0852   PLT 397.0 04/22/2012 0839   MCV 104.6* 10/07/2012 0852   MCV 115.2 Repeated and verified X2.* 04/22/2012 0839   MCH 35.8* 10/07/2012 0852   MCH 40.5* 07/08/2010 1349   MCHC 34.2 10/07/2012 0852   MCHC 32.9 04/22/2012 0839   RDW 12.8 10/07/2012 0852   RDW 12.8 04/22/2012 0839   LYMPHSABS 1.6 10/07/2012 0852   LYMPHSABS 1.5 04/22/2012 0839   MONOABS 0.4 10/07/2012 0852   MONOABS 0.3 04/22/2012 0839   EOSABS 0.0 10/07/2012 0852   EOSABS 0.1 04/22/2012 0839   BASOSABS 0.0 10/07/2012 0852   BASOSABS 0.0 04/22/2012 0839    Hgb A1C Lab Results  Component Value Date   HGBA1C 6.2 12/16/2009        Assessment & Plan:   Subconjunctival hemorrhage, secondary to hypertension and stress:  Reassurance given that this will resolve with time Will discuss with Dr. Posey Rea about switching your HBP medication back to the former med as you feel this new one is ineffective Practice stress relieving techniques and deep breathing exercises  F/u with Dr. Posey Rea regarding blood pressure medication

## 2013-01-06 ENCOUNTER — Telehealth: Payer: Self-pay | Admitting: Hematology and Oncology

## 2013-01-06 ENCOUNTER — Ambulatory Visit (HOSPITAL_BASED_OUTPATIENT_CLINIC_OR_DEPARTMENT_OTHER): Payer: Medicare Other | Admitting: Hematology and Oncology

## 2013-01-06 ENCOUNTER — Other Ambulatory Visit (HOSPITAL_BASED_OUTPATIENT_CLINIC_OR_DEPARTMENT_OTHER): Payer: Medicare Other | Admitting: Lab

## 2013-01-06 VITALS — BP 132/78 | HR 83 | Temp 99.9°F | Resp 20 | Ht 63.0 in | Wt 166.6 lb

## 2013-01-06 DIAGNOSIS — D759 Disease of blood and blood-forming organs, unspecified: Secondary | ICD-10-CM

## 2013-01-06 LAB — CBC WITH DIFFERENTIAL/PLATELET
BASO%: 0.6 % (ref 0.0–2.0)
EOS%: 0.7 % (ref 0.0–7.0)
LYMPH%: 30 % (ref 14.0–49.7)
MCH: 37.4 pg — ABNORMAL HIGH (ref 25.1–34.0)
MCHC: 35.2 g/dL (ref 31.5–36.0)
MONO#: 0.4 10*3/uL (ref 0.1–0.9)
Platelets: 374 10*3/uL (ref 145–400)
RBC: 3.11 10*6/uL — ABNORMAL LOW (ref 3.70–5.45)
WBC: 5.4 10*3/uL (ref 3.9–10.3)
lymph#: 1.6 10*3/uL (ref 0.9–3.3)

## 2013-01-06 LAB — COMPREHENSIVE METABOLIC PANEL (CC13)
ALT: 13 U/L (ref 0–55)
AST: 26 U/L (ref 5–34)
Alkaline Phosphatase: 56 U/L (ref 40–150)
Calcium: 9.5 mg/dL (ref 8.4–10.4)
Chloride: 101 mEq/L (ref 98–109)
Creatinine: 0.8 mg/dL (ref 0.6–1.1)

## 2013-01-06 LAB — LACTATE DEHYDROGENASE (CC13): LDH: 209 U/L (ref 125–245)

## 2013-01-06 MED ORDER — AMLODIPINE BESYLATE 5 MG PO TABS: 5.0000 mg | ORAL_TABLET | Freq: Every day | ORAL | Status: DC

## 2013-01-06 NOTE — Telephone Encounter (Signed)
Pt informed

## 2013-01-06 NOTE — Telephone Encounter (Signed)
gv and printed appt sched and avs for pt  °

## 2013-01-06 NOTE — Telephone Encounter (Signed)
Pt informed. What can she take because she states what she is currently taking is not controlling her BP?  Please advise

## 2013-01-06 NOTE — Progress Notes (Signed)
Patient ID: Paula Griffith, female   DOB: 1946/01/11, 67 y.o.   MRN: [id number] CSN: [id number]  CC: Paula Quint. [name], MD  Problem List: KAIYLA STAHLY is a 67 y.o. African-American female with a problem list consisting of:  1. Essential thrombocythemia with thrombocytosis going back to 2000. Mutations for JAK2 V617F, Exon 12, and MPL515 were negative. The patient had a bone marrow on 11/14/2009 which showed slightly hypercellular marrow for the patient's age, along with abundant iron stores. There was minimal reticulin fibrosis. Trichrome stains failed to show any definite collagenous fibrosis. The  patient is maintained on hydroxyurea and aspirin since mid June 2011.  2. Right retinal artery occlusion and subsequent retinal infarct on 05/22/2009. The patient's vision has returned to near normal.  3. Peripheral vascular disease involving the right carotid artery detected 08/05/2009.  4. Hypertension 5. Allergic rhinitis 6. History of asthma 7. Adenomatous colonic polyps. 8. Diverticulosis  I saw [name] today for follow up of her presumed essential thrombocythemia currently maintained on aspirin and hydroxyurea. Paula Griffith was last seen by [place] on 06/2012. She has been coming in every 2 months for CBCs and her platelet counts have been fairly well-maintained in the 200-300 range. Paula Griffith denies any major symptomatology including any unusual bleeding, bruising or thrombotic episodes.  She does report occasional wrist pain, but states the pain is tolerable.  She generally feels well and her eyesight has not diminished. 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  Past Medical History: Past Medical History  Diagnosis Date  . Colon polyps   . Diverticulosis of colon   . HTN (hypertension)   . LBP (low back pain)   . Osteoarthritis   . Asthma   . Allergic rhinitis   . Glucose intolerance (impaired glucose tolerance)   . PVD (peripheral vascular  disease) 2011    Right Carotid Dr Paula Griffith  . 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 Thrombocytopathy 2011    Dr Paula Griffith    Surgical History: Past Surgical History  Procedure Laterality Date  . Abdominal hysterectomy  1989    Current Medications: Current Outpatient Prescriptions  Medication Sig Dispense Refill  . albuterol (PROAIR HFA) 108 (90 BASE) MCG/ACT inhaler Inhale 2 puffs into the lungs every 4 (four) hours as needed. For shortness of breath  1 Inhaler  11  . aspirin 325 MG EC tablet Take 325 mg by mouth daily after breakfast.        . budesonide-formoterol (SYMBICORT) 80-4.5 MCG/ACT inhaler Inhale 2 puffs into the lungs 2 (two) times daily.  10.2 g  5  . Cholecalciferol 1000 UNITS tablet Take 1,000 Units by mouth daily.        . furosemide (LASIX) 40 MG tablet Take 40 mg by mouth every morning. As needed for swelling       . Glucosamine-Chondroit-Vit C-Mn (GLUCOSAMINE 1500 COMPLEX PO) Take by mouth.      Marland Kitchen HYDROcodone-acetaminophen (NORCO/VICODIN) 5-325 MG per tablet Take 1 tablet by mouth every 8 (eight) hours as needed for pain.  90 tablet  0  . hydroxyurea (HYDREA) 500 MG capsule TAKE ONE CAPSULE BY MOUTH TWICE DAILY FOR 5 DAYS THEN  TAKE  ONE CAPSULE  EVERY  DAY  FOR  2  DAYS  OR  AS  DIRECTED  60 capsule  1  . ibuprofen (ADVIL,MOTRIN) 600 MG tablet Take 1 tablet (600 mg total) by mouth every 8 (eight) hours as  needed for pain.  60 tablet  3  . loratadine (CLARITIN) 10 MG tablet Take 1 tablet (10 mg total) by mouth daily.  90 tablet  3  . losartan-hydrochlorothiazide (HYZAAR) 100-25 MG per tablet Take 1 tablet by mouth daily.  90 tablet  3  . meclizine (ANTIVERT) 12.5 MG tablet 1-2 tablets as needed daily for dizziness        Current Facility-Administered Medications  Medication Dose Route Frequency Provider Last Rate Last Dose  . methylPREDNISolone acetate (DEPO-MEDROL) injection 80 mg  80 mg Intra-articular Once Paula Garter, MD        Allergies: Allergies   Allergen Reactions  . Benazepril     cough  . Codeine Nausea Only  . Tramadol     Dizzy, nauseated    Family History: Family History  Problem Relation Age of Onset  . Coronary artery disease      Female 1st degree relative Female <60  . Lung cancer    . Pancreatic cancer Mother   . Arthritis Mother     RA  . Early death Father 18    MI  . Diabetes Sister   . Colon cancer Neg Hx   . Stomach cancer Neg Hx     Social History: History  Substance Use Topics  . Smoking status: Never Smoker   . Smokeless tobacco: Never Used  . Alcohol Use: No    Review of Systems: 10 Point review of systems was completed and is negative except as noted above.   Physical Exam:   There were no vitals taken for this visit.  General appearance: Alert, cooperative, well nourished, no apparent distress Head: Normocephalic, without obvious abnormality, atraumatic Eyes: Conjunctivae/corneas clear, PERRLA, EOMI, wears corrective lenses Nose: Nares, septum and mucosa are normal, no drainage or sinus tenderness Neck: No adenopathy, supple, symmetrical, trachea midline, thyroid not enlarged, no tenderness Resp: Clear to auscultation bilaterally Cardio: Regular rate and rhythm, S1, S2 normal, no murmur, click, rub or gallop GI: Soft, distended, non-tender, hypoactive bowel sounds, no organomegaly Extremities: RUE has wrist cysts/lipomas - non-tender, LUE has large well-healed scar, all other extremities are normal, atraumatic, no cyanosis or edema Lymph nodes: Cervical, supraclavicular, and axillary nodes normal Neurologic: Grossly normal   Laboratory Data: CBC    Component Value Date/Time   WBC 5.4 01/06/2013 0906   WBC 3.9* 04/22/2012 0839   RBC 3.11* 01/06/2013 0906   RBC 3.12* 04/22/2012 0839   HGB 11.6 01/06/2013 0906   HGB 11.8* 04/22/2012 0839   HCT 33.1* 01/06/2013 0906   HCT 35.9* 04/22/2012 0839   PLT 374 01/06/2013 0906   PLT 397.0 04/22/2012 0839   MCV 106.4* 01/06/2013 0906    MCV 115.2 Repeated and verified X2.* 04/22/2012 0839   MCH 37.4* 01/06/2013 0906   MCH 40.5* 07/08/2010 1349   MCHC 35.2 01/06/2013 0906   MCHC 32.9 04/22/2012 0839   RDW 12.5 01/06/2013 0906   RDW 12.8 04/22/2012 0839   LYMPHSABS 1.6 01/06/2013 0906   LYMPHSABS 1.5 04/22/2012 0839   MONOABS 0.4 01/06/2013 0906   MONOABS 0.3 04/22/2012 0839   EOSABS 0.0 01/06/2013 0906   EOSABS 0.1 04/22/2012 0839   BASOSABS 0.0 01/06/2013 0906   BASOSABS 0.0 04/22/2012 0839      Imaging Studies: 1. Ultrasound of the abdomen on 10/04/2009 showed normal spleen size with a length of 6.2 cm, normal echogenicity, and no focal  abnormalities.  2. Digital screening mammogram on 10/27/2010 was negative.  3. Digital screening  mammogram on 11/02/2011 was negative. 4. Chest x-ray 2 view on 04/08/2012 showed no active cardiopulmonary disease. 5. Complete right hand x-ray on 04/08/2012 showed osteoarthritis of the right wrist and hand, most pronounced along  the radial aspect of the wrist and first CMC joint.   Procedures: Colonoscopy carried out on 05/24/2007 apparently showed adenomatous polyps.  She has another colonoscopy scheduled for 07/26/2012.   Impression/Plan: Paula Griffith seems to be doing very well at the present time with good control of her platelet count. She has had no thrombotic or bleeding problems. She will stay on the same dose of Hydroxyurea, which is 500 mg twice daily on Mondays and Thursdays and 500 mg daily the other 5 days of the week. She takes 9 capsules of Hydroxyurea per week. She is also on Aspirin 325 mg daily. We will  check CBC in 3 months and plan to see Paula Griffith again in 6 months at which time we will check CBC and chemistries. Paula Griffith is encouraged to contact us in the interim if she has any questions or concerns.    Tiwan Schnitker E,  01/06/2013, 9:05 AM

## 2013-01-06 NOTE — Telephone Encounter (Signed)
No. Benazepril caused cough - can't take it. Thx

## 2013-01-06 NOTE — Telephone Encounter (Signed)
Pls star Amlodipine 5 mg qd Thx

## 2013-01-30 ENCOUNTER — Encounter: Payer: Self-pay | Admitting: Cardiovascular Disease

## 2013-02-13 ENCOUNTER — Other Ambulatory Visit: Payer: Self-pay

## 2013-02-13 MED ORDER — BUDESONIDE-FORMOTEROL FUMARATE 80-4.5 MCG/ACT IN AERO: 2.0000 | INHALATION_SPRAY | Freq: Two times a day (BID) | RESPIRATORY_TRACT | Status: DC

## 2013-02-22 ENCOUNTER — Ambulatory Visit: Payer: Medicare Other | Admitting: Vascular Surgery

## 2013-02-22 ENCOUNTER — Other Ambulatory Visit: Payer: Medicare Other

## 2013-03-09 ENCOUNTER — Other Ambulatory Visit: Payer: Self-pay | Admitting: Oncology

## 2013-03-09 ENCOUNTER — Telehealth: Payer: Self-pay | Admitting: *Deleted

## 2013-03-09 DIAGNOSIS — D759 Disease of blood and blood-forming organs, unspecified: Secondary | ICD-10-CM

## 2013-03-09 NOTE — Telephone Encounter (Signed)
Called patient to clarify current use of Hydrea.  Reports currently takes two 5050 mg tabs on Mondays and Thursdays and one tab all other days.  Would like bottle instructions to remain the same for possibility of changes in the future.

## 2013-03-15 ENCOUNTER — Encounter: Payer: Self-pay | Admitting: Internal Medicine

## 2013-03-15 ENCOUNTER — Ambulatory Visit (INDEPENDENT_AMBULATORY_CARE_PROVIDER_SITE_OTHER): Payer: Medicare Other | Admitting: Internal Medicine

## 2013-03-15 VITALS — BP 158/86 | HR 72 | Temp 99.0°F | Resp 16 | Wt 163.0 lb

## 2013-03-15 DIAGNOSIS — I10 Essential (primary) hypertension: Secondary | ICD-10-CM

## 2013-03-15 DIAGNOSIS — J45909 Unspecified asthma, uncomplicated: Secondary | ICD-10-CM

## 2013-03-15 DIAGNOSIS — Z23 Encounter for immunization: Secondary | ICD-10-CM

## 2013-03-15 DIAGNOSIS — M545 Low back pain, unspecified: Secondary | ICD-10-CM

## 2013-03-15 MED ORDER — BUDESONIDE-FORMOTEROL FUMARATE 80-4.5 MCG/ACT IN AERO: 2.0000 | INHALATION_SPRAY | Freq: Two times a day (BID) | RESPIRATORY_TRACT | Status: DC

## 2013-03-15 NOTE — Assessment & Plan Note (Signed)
Gluten free trial (no wheat products) for 4-6 weeks. OK to use gluten-free bread and gluten-free pasta.  Milk free trial (no milk, ice cream, cheese and yogurt) for 4-6 weeks. OK to use almond, coconut, rice or soy milk. "Almond breeze" brand tastes good.  

## 2013-03-15 NOTE — Progress Notes (Signed)
Subjective:   HPI  Retired again! She is at [employer].  The patient is here to follow up on chronic thrombocythosis, headaches and chronic moderate LBP and B wrist arthritis symptoms not controlled with medicines - worse.   F/u R knee, R hip, thigh pain. She did take Prednisone this time - it helped. Pain is 10/10 at night. Tylenol is not helping...  Review of Systems  Constitutional: Negative.  Negative for fever, chills, diaphoresis, activity change, appetite change, fatigue and unexpected weight change.  HENT: Negative for hearing loss, ear pain, nosebleeds, congestion, sore throat, facial swelling, rhinorrhea, sneezing, mouth sores, trouble swallowing, neck pain, neck stiffness, postnasal drip, sinus pressure and tinnitus.   Eyes: Negative for pain, discharge, redness, itching and visual disturbance.  Respiratory: Negative for cough, chest tightness, shortness of breath, wheezing and stridor.   Cardiovascular: Negative for chest pain, palpitations and leg swelling.  Gastrointestinal: Negative for nausea, diarrhea, constipation, blood in stool, abdominal distention, anal bleeding and rectal pain.  Genitourinary: Negative for dysuria, urgency, frequency, hematuria, flank pain, vaginal bleeding, vaginal discharge, difficulty urinating, genital sores and pelvic pain.  Musculoskeletal: Negative for back pain, joint swelling, arthralgias and gait problem.  Skin: Negative.  Negative for rash.  Neurological: Negative for dizziness, tremors, seizures, syncope, speech difficulty, weakness, numbness and headaches.  Hematological: Negative for adenopathy. Does not bruise/bleed easily.  Psychiatric/Behavioral: Negative for suicidal ideas, behavioral problems, sleep disturbance, dysphoric mood and decreased concentration. The patient is not nervous/anxious.    Wt Readings from Last 3 Encounters:  03/15/13 163 lb (73.936 kg)  01/06/13 166 lb 9.6 oz (75.569 kg)  01/05/13 167 lb (75.751 kg)    BP Readings from Last 3 Encounters:  03/15/13 158/86  01/06/13 132/78  01/05/13 150/84        Objective:   Physical Exam  Constitutional: She appears well-developed and well-nourished. No distress.  HENT:  Head: Normocephalic.  Right Ear: External ear normal.  Left Ear: External ear normal.  Nose: Nose normal.  Mouth/Throat: Oropharynx is clear and moist.  Eyes: Conjunctivae are normal. Pupils are equal, round, and reactive to light. Right eye exhibits no discharge. Left eye exhibits no discharge.  Neck: Normal range of motion. Neck supple. No JVD present. No tracheal deviation present. No thyromegaly present.  Cardiovascular: Normal rate, regular rhythm and normal heart sounds.   Pulmonary/Chest: No stridor. No respiratory distress. She has no wheezes.  Abdominal: Soft. Bowel sounds are normal. She exhibits no distension and no mass. There is no tenderness. There is no rebound and no guarding.  Musculoskeletal: She exhibits tenderness. She exhibits no edema.  B radial wrists with less tender firm swelling No cane LS, B hips are tender  Lymphadenopathy:    She has no cervical adenopathy.  Neurological: She displays normal reflexes. No cranial nerve deficit. She exhibits normal muscle tone. Coordination normal.  Skin: No rash noted. No erythema. No pallor.  Psychiatric: She has a normal mood and affect. Her behavior is normal. Judgment and thought content normal.   Lab Results  Component Value Date   WBC 5.4 01/06/2013   HGB 11.6 01/06/2013   HCT 33.1* 01/06/2013   PLT 374 01/06/2013   GLUCOSE 168* 01/06/2013   CHOL 145 04/22/2012   TRIG 105.0 04/22/2012   HDL 36.50* 04/22/2012   LDLCALC 88 04/22/2012   ALT 13 01/06/2013   AST 26 01/06/2013   NA 141 01/06/2013   K 3.5 01/06/2013   CL 105 07/08/2012   CREATININE 0.8  01/06/2013   BUN 10.2 01/06/2013   CO2 28 01/06/2013   TSH 0.59 04/22/2012   HGBA1C 6.2 12/16/2009        Assessment & Plan:

## 2013-03-15 NOTE — Patient Instructions (Signed)
Gluten free trial (no wheat products) for 4-6 weeks. OK to use gluten-free bread and gluten-free pasta.  Milk free trial (no milk, ice cream, cheese and yogurt) for 4-6 weeks. OK to use almond, coconut, rice or soy milk. "Almond breeze" brand tastes good.  

## 2013-03-18 NOTE — Assessment & Plan Note (Signed)
Continue with current prescription therapy as reflected on the Med list.  

## 2013-03-28 ENCOUNTER — Encounter: Payer: Self-pay | Admitting: Vascular Surgery

## 2013-03-29 ENCOUNTER — Ambulatory Visit (INDEPENDENT_AMBULATORY_CARE_PROVIDER_SITE_OTHER): Payer: Medicare Other | Admitting: Vascular Surgery

## 2013-03-29 ENCOUNTER — Ambulatory Visit (HOSPITAL_COMMUNITY)
Admission: RE | Admit: 2013-03-29 | Discharge: 2013-03-29 | Disposition: A | Discharge status: Home / Self Care | Payer: Medicare Other | Source: Ambulatory Visit | Attending: Vascular Surgery | Admitting: Vascular Surgery

## 2013-03-29 ENCOUNTER — Encounter: Payer: Self-pay | Admitting: Vascular Surgery

## 2013-03-29 DIAGNOSIS — I6523 Occlusion and stenosis of bilateral carotid arteries: Secondary | ICD-10-CM

## 2013-03-29 DIAGNOSIS — I6529 Occlusion and stenosis of unspecified carotid artery: Secondary | ICD-10-CM

## 2013-03-29 NOTE — Addendum Note (Signed)
Addended by: Adria Dill L on: 03/29/2013 12:58 PM   Modules accepted: Orders

## 2013-03-29 NOTE — Progress Notes (Signed)
[facility]   Patient name: Paula Griffith MRN: [id number] DOB: [birth year] Sex: female  REASON FOR VISIT: follow up of carotid disease.  HPI: Paula Griffith is a 67 y.o. female who I had originally seen in 2011 with a moderate right carotid stenosis. She had developed a sudden onset of a visual field cut in the right I and was found to have a retinal infarct with an embolus to the right retinal artery duplex suggested a moderate 40-59 to right carotid stenosis but her cerebral angiogram showed no significant carotid bifurcation disease. Therefore, we have simply been following her with duplex scans at your half intervals.  Since I saw her last, she denies any history of stroke, TIAs, expressive or receptive aphasia, or amaurosis fugax. She is on aspirin.  Past Medical History  Diagnosis Date  . Colon polyps   . Diverticulosis of colon   . HTN (hypertension)   . LBP (low back pain)   . Osteoarthritis   . Asthma   . Allergic rhinitis   . Glucose intolerance (impaired glucose tolerance)   . PVD (peripheral vascular disease) 2011    Right Carotid Dr Edilia Bo  . 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 Thrombocytopathy 2011    Dr Arline Asp    Family History  Problem Relation Age of Onset  . Coronary artery disease      Female 1st degree relative Female <60  . Lung cancer    . Pancreatic cancer Mother   . Arthritis Mother     RA  . Early death Father 44    MI  . Diabetes Sister   . Colon cancer Neg Hx   . Stomach cancer Neg Hx     SOCIAL HISTORY: History  Substance Use Topics  . Smoking status: Never Smoker   . Smokeless tobacco: Never Used  . Alcohol Use: No    Allergies  Allergen Reactions  . Benazepril     cough  . Codeine Nausea Only  . Tramadol     Dizzy, nauseated    Current Outpatient Prescriptions  Medication Sig Dispense Refill  . albuterol (PROAIR HFA) 108 (90 BASE) MCG/ACT inhaler Inhale 2 puffs into the lungs every 4 (four)  hours as needed. For shortness of breath  1 Inhaler  11  . amLODipine (NORVASC) 5 MG tablet Take 1 tablet (5 mg total) by mouth daily.  30 tablet  11  . aspirin 325 MG EC tablet Take 325 mg by mouth daily after breakfast.        . budesonide-formoterol (SYMBICORT) 80-4.5 MCG/ACT inhaler Inhale 2 puffs into the lungs 2 (two) times daily.  10.2 g  0  . Cholecalciferol 1000 UNITS tablet Take 1,000 Units by mouth daily.        . Glucosamine-Chondroit-Vit C-Mn (GLUCOSAMINE 1500 COMPLEX PO) Take by mouth.      . hydroxyurea (HYDREA) 500 MG capsule TAKE ONE CAPSULE BY MOUTH TWICE DAILY FOR 5 DAYS, THEN TAKE ONE CAPSULE BY MOUTH EVERY DAY FOR 2 DAYS OR AS DIRECTED  60 capsule  3  . ibuprofen (ADVIL,MOTRIN) 600 MG tablet Take 1 tablet (600 mg total) by mouth every 8 (eight) hours as needed for pain.  60 tablet  3  . loratadine (CLARITIN) 10 MG tablet Take 1 tablet (10 mg total) by mouth daily.  90 tablet  3  . losartan-hydrochlorothiazide (HYZAAR) 100-25 MG per tablet Take 1 tablet by mouth daily.  90 tablet  3  No current facility-administered medications for this visit.   REVIEW OF SYSTEMS: Arly.Keller ] denotes positive finding; [  ] denotes negative finding  CARDIOVASCULAR:  [ ]  chest pain   [ ]  chest pressure   [ ]  palpitations   [ ]  orthopnea   [ ]  dyspnea on exertion   [ ]  claudication   [ ]  rest pain   [ ]  DVT   [ ]  phlebitis PULMONARY:   [ ]  productive cough   [ ]  asthma   [ ]  wheezing NEUROLOGIC:   [ ]  weakness  [ ]  paresthesias  [ ]  aphasia  [ ]  amaurosis  [ ]  dizziness HEMATOLOGIC:   [ ]  bleeding problems   [ ]  clotting disorders MUSCULOSKELETAL:  [ ]  joint pain   [ ]  joint swelling [ ]  leg swelling GASTROINTESTINAL: [ ]   blood in stool  [ ]   hematemesis GENITOURINARY:  [ ]   dysuria  [ ]   hematuria PSYCHIATRIC:  [ ]  history of major depression INTEGUMENTARY:  [ ]  rashes  [ ]  ulcers CONSTITUTIONAL:  [ ]  fever   [ ]  chills  PHYSICAL EXAM: Filed Vitals:   03/29/13 1104 03/29/13 1107  BP: 124/71  122/69  Pulse: 74   Height: 5\' 3"  (1.6 m)   Weight: 162 lb (73.483 kg)   SpO2: 100%    Body mass index is 28.7 kg/(m^2). GENERAL: The patient is a well-nourished female, in no acute distress. The vital signs are documented above. CARDIOVASCULAR: There is a regular rate and rhythm. I do not detect carotid bruits. She has palpable pedal pulses. She has no significant lower extremity swelling. PULMONARY: There is good air exchange bilaterally without wheezing or rales. ABDOMEN: Soft and non-tender with normal pitched bowel sounds.  MUSCULOSKELETAL: There are no major deformities or cyanosis. NEUROLOGIC: No focal weakness or paresthesias are detected. SKIN: There are no ulcers or rashes noted. PSYCHIATRIC: The patient has a normal affect.  DATA:  I have independently interpreted her carotid duplex scan which shows no significant carotid disease bilaterally. Both vertebral arteries are patent with antegrade flow.  MEDICAL ISSUES:  Occlusion and stenosis of carotid artery without mention of cerebral infarction This patient has no significant carotid disease and remains asymptomatic. I think that it is safe to stretcher follow up altitude 2 years and I have ordered a follow up carotid duplex scan in 2 years. She is not a smoker. She is on aspirin. She remains fairly active. She knows to call sooner if she has problems.   Lillyauna Jenkinson S [facility] [place] [facility]: [phone]

## 2013-03-29 NOTE — Assessment & Plan Note (Signed)
This patient has no significant carotid disease and remains asymptomatic. I think that it is safe to stretcher follow up altitude 2 years and I have ordered a follow up carotid duplex scan in 2 years. She is not a smoker. She is on aspirin. She remains fairly active. She knows to call sooner if she has problems.

## 2013-03-30 ENCOUNTER — Other Ambulatory Visit: Payer: Self-pay | Admitting: Medical Oncology

## 2013-03-30 DIAGNOSIS — D759 Disease of blood and blood-forming organs, unspecified: Secondary | ICD-10-CM

## 2013-03-31 ENCOUNTER — Other Ambulatory Visit (HOSPITAL_BASED_OUTPATIENT_CLINIC_OR_DEPARTMENT_OTHER): Payer: Medicare Other | Admitting: Lab

## 2013-03-31 DIAGNOSIS — D759 Disease of blood and blood-forming organs, unspecified: Secondary | ICD-10-CM

## 2013-03-31 LAB — COMPREHENSIVE METABOLIC PANEL (CC13)
ALT: 9 U/L (ref 0–55)
AST: 20 U/L (ref 5–34)
Albumin: 3.6 g/dL (ref 3.5–5.0)
Alkaline Phosphatase: 60 U/L (ref 40–150)
BUN: 6.7 mg/dL — ABNORMAL LOW (ref 7.0–26.0)
CO2: 29 mEq/L (ref 22–29)
Creatinine: 0.8 mg/dL (ref 0.6–1.1)
Potassium: 3.7 mEq/L (ref 3.5–5.1)
Total Protein: 7.1 g/dL (ref 6.4–8.3)

## 2013-03-31 LAB — CBC WITH DIFFERENTIAL/PLATELET
BASO%: 1.3 % (ref 0.0–2.0)
Basophils Absolute: 0.1 10*3/uL (ref 0.0–0.1)
EOS%: 1 % (ref 0.0–7.0)
HGB: 11.2 g/dL — ABNORMAL LOW (ref 11.6–15.9)
MCH: 37.3 pg — ABNORMAL HIGH (ref 25.1–34.0)
MCHC: 34.9 g/dL (ref 31.5–36.0)
MCV: 107.1 fL — ABNORMAL HIGH (ref 79.5–101.0)
MONO%: 8.5 % (ref 0.0–14.0)
NEUT%: 58.4 % (ref 38.4–76.8)
Platelets: 417 10*3/uL — ABNORMAL HIGH (ref 145–400)
RDW: 12.6 % (ref 11.2–14.5)

## 2013-04-13 ENCOUNTER — Other Ambulatory Visit: Payer: Self-pay | Admitting: Internal Medicine

## 2013-06-28 ENCOUNTER — Other Ambulatory Visit: Payer: Self-pay | Admitting: Internal Medicine

## 2013-06-28 DIAGNOSIS — D759 Disease of blood and blood-forming organs, unspecified: Secondary | ICD-10-CM

## 2013-06-30 ENCOUNTER — Telehealth: Payer: Self-pay | Admitting: Internal Medicine

## 2013-06-30 ENCOUNTER — Encounter: Payer: Self-pay | Admitting: Internal Medicine

## 2013-06-30 ENCOUNTER — Ambulatory Visit (HOSPITAL_BASED_OUTPATIENT_CLINIC_OR_DEPARTMENT_OTHER): Payer: Medicare Other | Admitting: Internal Medicine

## 2013-06-30 ENCOUNTER — Other Ambulatory Visit (HOSPITAL_BASED_OUTPATIENT_CLINIC_OR_DEPARTMENT_OTHER): Payer: Medicare Other

## 2013-06-30 VITALS — BP 160/73 | HR 86 | Temp 98.7°F | Resp 18 | Ht 63.0 in | Wt 163.8 lb

## 2013-06-30 DIAGNOSIS — D759 Disease of blood and blood-forming organs, unspecified: Secondary | ICD-10-CM

## 2013-06-30 DIAGNOSIS — D473 Essential (hemorrhagic) thrombocythemia: Secondary | ICD-10-CM

## 2013-06-30 LAB — CBC WITH DIFFERENTIAL/PLATELET
BASO%: 0.6 % (ref 0.0–2.0)
Basophils Absolute: 0 10*3/uL (ref 0.0–0.1)
EOS ABS: 0.1 10*3/uL (ref 0.0–0.5)
EOS%: 1.1 % (ref 0.0–7.0)
HCT: 33.7 % — ABNORMAL LOW (ref 34.8–46.6)
HGB: 11.5 g/dL — ABNORMAL LOW (ref 11.6–15.9)
LYMPH#: 1.7 10*3/uL (ref 0.9–3.3)
LYMPH%: 33.9 % (ref 14.0–49.7)
MCH: 37.5 pg — ABNORMAL HIGH (ref 25.1–34.0)
MCHC: 34 g/dL (ref 31.5–36.0)
MCV: 110.1 fL — ABNORMAL HIGH (ref 79.5–101.0)
MONO#: 0.5 10*3/uL (ref 0.1–0.9)
MONO%: 10.3 % (ref 0.0–14.0)
NEUT%: 54.1 % (ref 38.4–76.8)
NEUTROS ABS: 2.8 10*3/uL (ref 1.5–6.5)
PLATELETS: 410 10*3/uL — AB (ref 145–400)
RBC: 3.06 10*6/uL — ABNORMAL LOW (ref 3.70–5.45)
RDW: 12.7 % (ref 11.2–14.5)
WBC: 5.2 10*3/uL (ref 3.9–10.3)

## 2013-06-30 LAB — COMPREHENSIVE METABOLIC PANEL (CC13)
ALT: 9 U/L (ref 0–55)
AST: 19 U/L (ref 5–34)
Albumin: 3.8 g/dL (ref 3.5–5.0)
Alkaline Phosphatase: 49 U/L (ref 40–150)
Anion Gap: 11 mEq/L (ref 3–11)
BILIRUBIN TOTAL: 0.48 mg/dL (ref 0.20–1.20)
BUN: 9.8 mg/dL (ref 7.0–26.0)
CHLORIDE: 102 meq/L (ref 98–109)
CO2: 27 mEq/L (ref 22–29)
Calcium: 9.5 mg/dL (ref 8.4–10.4)
Creatinine: 0.8 mg/dL (ref 0.6–1.1)
GLUCOSE: 158 mg/dL — AB (ref 70–140)
POTASSIUM: 3.6 meq/L (ref 3.5–5.1)
SODIUM: 141 meq/L (ref 136–145)
TOTAL PROTEIN: 7.2 g/dL (ref 6.4–8.3)

## 2013-06-30 NOTE — Telephone Encounter (Signed)
sw. pt and advised on March and July appts. pt ok and aware

## 2013-06-30 NOTE — Progress Notes (Signed)
[facility] OFFICE PROGRESS NOTE  Paula Kehr, MD [facility] [address] [place] [address]  DIAGNOSIS: THROMBOCYTOSIS - Plan: CBC with Differential, Comprehensive metabolic panel (Cmet) - [facility], CBC with Differential  Chief Complaint  Patient presents with  . THROMBOCYTOSIS    CURRENT THERAPY: Hydrea 500 mg  Every day except 1,000 mg on M and Thurs.  Aspirin 81 mg daily.   INTERVAL HISTORY: Paula Griffith 68 y.o. female with presumed essential thrombocythemia currently maintained on aspirin and hydroxyurea is here for follow-up.  She was last seen by Dr. Jilda Roche on 01/06/2013. She has been coming in every 2 months for CBCs and her platelet counts have been fairly well-maintained in the 200-300 range. Paula Griffith denies any major symptomatology including any unusual bleeding, bruising or thrombotic episodes. She does report having an accidental right ankle sprain while walking her dog about 2 months ago.  She has been using rubbing alcohol with partial relief.   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 She denies any hospitalizations.  She is up-to-date for her mammogram and colonoscopy.   MEDICAL HISTORY: Past Medical History  Diagnosis Date  . Colon polyps   . Diverticulosis of colon   . HTN (hypertension)   . LBP (low back pain)   . Osteoarthritis   . Asthma   . Allergic rhinitis   . Glucose intolerance (impaired glucose tolerance)   . PVD (peripheral vascular disease) 2011    Right Carotid Dr Scot Dock  . 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 Thrombocytopathy 2011    Dr Ralene Ok    INTERIM HISTORY: has THROMBOCYTOSIS; INSOMNIA-SLEEP DISORDER-UNSPEC; RETINAL ISCHEMIA; VISUAL IMPAIRMENT; HYPERTENSION; PERIPHERAL VASCULAR DISEASE; ALLERGIC RHINITIS; ASTHMA; DIVERTICULOSIS, COLON; OSTEOARTHRITIS; LOW BACK PAIN; VERTIGO; SKIN RASH; Edema; SPRAIN&STRAIN OF UNSPECIFIED SITE OF KNEE&LEG; COLONIC POLYPS, HX OF;  Arthritis pain, wrist; Occlusion and stenosis of carotid artery without mention of cerebral infarction; Dermatitis; Hyperglycemia; and Right hip pain on her problem list.    ALLERGIES:  is allergic to benazepril; codeine; and tramadol.  MEDICATIONS: has a current medication list which includes the following prescription(s): albuterol, amlodipine, aspirin, budesonide-formoterol, cholecalciferol, glucosamine-chondroit-vit c-mn, hydroxyurea, ibuprofen, loratadine, and losartan-hydrochlorothiazide.  SURGICAL HISTORY:  Past Surgical History  Procedure Laterality Date  . Abdominal hysterectomy  1989   PROBLEM LIST: 1. Essential thrombocythemia with thrombocytosis going back to 2000. Mutations for JAK2 V617F, Exon 12, and MPL515 were negative. The patient had a bone marrow on 11/14/2009 which showed slightly hypercellular marrow for the patient's age, along with abundant iron stores. There was minimal reticulin fibrosis. Trichrome stains failed to show any definite collagenous fibrosis. The patient is maintained on hydroxyurea and aspirin since mid June 2011.  2. Right retinal artery occlusion and subsequent retinal infarct on 05/22/2009. The patient's vision has returned to near normal.  3. Peripheral vascular disease involving the right carotid artery detected 08/05/2009.  4. Hypertension  5. Allergic rhinitis  6. History of asthma  7. Adenomatous colonic polyps.  8. Diverticulosis  REVIEW OF SYSTEMS:   Constitutional: Denies fevers, chills or abnormal weight loss Eyes: Denies blurriness of vision Ears, nose, mouth, throat, and face: Denies mucositis or sore throat Respiratory: Denies cough, dyspnea or wheezes Cardiovascular: Denies palpitation, chest discomfort or lower extremity swelling Gastrointestinal:  Denies nausea, heartburn or change in bowel habits Skin: Denies abnormal skin rashes Lymphatics: Denies new lymphadenopathy or easy bruising Neurological:Denies numbness, tingling or new  weaknesses  Behavioral/Psych: Mood is stable, no new changes  All other systems were reviewed with the patient and are negative.  PHYSICAL EXAMINATION: ECOG PERFORMANCE STATUS: 0 - Asymptomatic  Blood pressure 160/73, pulse 86, temperature 98.7 F (37.1 C), temperature source Oral, resp. rate 18, height 5\' 3"  (1.6 m), weight 163 lb 12.8 oz (74.299 kg), SpO2 100.00%.  GENERAL:alert, no distress and comfortable; well developed and well nourished. SKIN: skin color, texture, turgor are normal, no rashes or significant lesions EYES: normal, Conjunctiva are pink and non-injected, sclera clear OROPHARYNX:no exudate, no erythema and lips, buccal mucosa, and tongue normal  NECK: supple, thyroid normal size, non-tender, without nodularity LYMPH:  no palpable lymphadenopathy in the cervical, axillary or supraclavicular LUNGS: clear to auscultation and percussion with normal breathing effort HEART: regular rate & rhythm and no murmurs and no lower extremity edema ABDOMEN:abdomen soft, non-tender and normal bowel sounds Musculoskeletal:no cyanosis of digits and no clubbing ; R ankle larger than Left with slight tenderness to palpation.  NEURO: alert & oriented x 3 with fluent speech, no focal motor/sensory deficits   LABORATORY DATA: Results for orders placed in visit on 06/30/13 (from the past 48 hour(s))  CBC WITH DIFFERENTIAL     Status: Abnormal   Collection Time    06/30/13  8:20 AM      Result Value Range   WBC 5.2  3.9 - 10.3 10e3/uL   NEUT# 2.8  1.5 - 6.5 10e3/uL   HGB 11.5 (*) 11.6 - 15.9 g/dL   HCT 33.7 (*) 34.8 - 46.6 %   Platelets 410 (*) 145 - 400 10e3/uL   MCV 110.1 (*) 79.5 - 101.0 fL   MCH 37.5 (*) 25.1 - 34.0 pg   MCHC 34.0  31.5 - 36.0 g/dL   RBC 3.06 (*) 3.70 - 5.45 10e6/uL   RDW 12.7  11.2 - 14.5 %   lymph# 1.7  0.9 - 3.3 10e3/uL   MONO# 0.5  0.1 - 0.9 10e3/uL   Eosinophils Absolute 0.1  0.0 - 0.5 10e3/uL   Basophils Absolute 0.0  0.0 - 0.1 10e3/uL   NEUT% 54.1  38.4  - 76.8 %   LYMPH% 33.9  14.0 - 49.7 %   MONO% 10.3  0.0 - 14.0 %   EOS% 1.1  0.0 - 7.0 %   BASO% 0.6  0.0 - 2.0 %   Labs:  Lab Results  Component Value Date   WBC 5.2 06/30/2013   HGB 11.5* 06/30/2013   HCT 33.7* 06/30/2013   MCV 110.1* 06/30/2013   PLT 410* 06/30/2013   NEUTROABS 2.8 06/30/2013      Chemistry      Component Value Date/Time   NA 141 03/31/2013 0833   NA 138 04/22/2012 0839   K 3.7 03/31/2013 0833   K 3.7 04/22/2012 0839   CL 105 07/08/2012 0838   CL 103 04/22/2012 0839   CO2 29 03/31/2013 0833   CO2 26 04/22/2012 0839   BUN 6.7* 03/31/2013 0833   BUN 18 04/22/2012 0839   CREATININE 0.8 03/31/2013 0833   CREATININE 0.9 04/22/2012 0839      Component Value Date/Time   CALCIUM 9.3 03/31/2013 0833   CALCIUM 9.4 04/22/2012 0839   ALKPHOS 60 03/31/2013 0833   ALKPHOS 46 04/22/2012 0839   AST 20 03/31/2013 0833   AST 18 04/22/2012 0839   ALT 9 03/31/2013 0833   ALT 8 04/22/2012 0839   BILITOT 0.42 03/31/2013 0833   BILITOT 0.7 04/22/2012 6761  CBC:  Recent Labs Lab 06/30/13 0820  WBC 5.2  NEUTROABS 2.8  HGB 11.5*  HCT 33.7*  MCV 110.1*  PLT 410*   Microbiology No results found for this or any previous visit (from the past 240 hour(s)).  Studies:  No results found.   RADIOGRAPHIC STUDIES: No results found.  ASSESSMENT: MASIYAH HILPERT 68 y.o. female with a history of THROMBOCYTOSIS - Plan: CBC with Differential, Comprehensive metabolic panel (Cmet) - [facility], CBC with Differential   PLAN:  1. Thrombocytosis.  --Paula Griffith seems to be doing  well at the present time with good control of her platelet count. She has had no thrombotic or bleeding problems. She will stay on the same dose of Hydroxyurea, which is 500 mg twice daily on Mondays and Thursdays and 500 mg daily the other 5 days of the week. She takes 9 capsules of Hydroxyurea per week. She is also on Aspirin 325 mg daily.   2. Follow-up. --We will check CBC in 3 months and plan to see Paula Griffith  again in 6 months at which time we will check CBC and chemistries. Paula Griffith is encouraged to contact us in the interim if she has any questions or concerns.  All questions were answered. The patient knows to call the clinic with any problems, questions or concerns. We can certainly see the patient much sooner if necessary.  I spent 10 minutes counseling the patient face to face. The total time spent in the appointment was 15 minutes.    Kassadi Presswood, MD 06/30/2013 9:15 AM

## 2013-07-17 ENCOUNTER — Other Ambulatory Visit: Payer: Self-pay | Admitting: Internal Medicine

## 2013-07-19 ENCOUNTER — Ambulatory Visit (INDEPENDENT_AMBULATORY_CARE_PROVIDER_SITE_OTHER): Payer: Medicare Other | Admitting: Internal Medicine

## 2013-07-19 ENCOUNTER — Ambulatory Visit (INDEPENDENT_AMBULATORY_CARE_PROVIDER_SITE_OTHER)
Admission: RE | Admit: 2013-07-19 | Discharge: 2013-07-19 | Disposition: A | Discharge status: Home / Self Care | Payer: Medicare Other | Source: Ambulatory Visit | Attending: Internal Medicine | Admitting: Internal Medicine

## 2013-07-19 ENCOUNTER — Encounter: Payer: Self-pay | Admitting: Internal Medicine

## 2013-07-19 VITALS — BP 128/70 | HR 100 | Temp 98.1°F | Resp 16 | Wt 165.0 lb

## 2013-07-19 DIAGNOSIS — J45909 Unspecified asthma, uncomplicated: Secondary | ICD-10-CM

## 2013-07-19 DIAGNOSIS — M25579 Pain in unspecified ankle and joints of unspecified foot: Secondary | ICD-10-CM

## 2013-07-19 DIAGNOSIS — I10 Essential (primary) hypertension: Secondary | ICD-10-CM

## 2013-07-19 DIAGNOSIS — M25571 Pain in right ankle and joints of right foot: Secondary | ICD-10-CM

## 2013-07-19 MED ORDER — IBUPROFEN 600 MG PO TABS: 600.0000 mg | ORAL_TABLET | Freq: Three times a day (TID) | ORAL | Status: DC | PRN

## 2013-07-19 NOTE — Progress Notes (Signed)
Pre visit review using our clinic review tool, if applicable. No additional management support is needed unless otherwise documented below in the visit note. 

## 2013-07-19 NOTE — Assessment & Plan Note (Signed)
ACE Xray 

## 2013-07-20 NOTE — Assessment & Plan Note (Signed)
Continue with current prescription therapy as reflected on the Med list.  

## 2013-08-09 ENCOUNTER — Ambulatory Visit (INDEPENDENT_AMBULATORY_CARE_PROVIDER_SITE_OTHER): Payer: Medicare Other | Admitting: Internal Medicine

## 2013-08-09 ENCOUNTER — Encounter: Payer: Self-pay | Admitting: Internal Medicine

## 2013-08-09 VITALS — BP 138/70 | HR 80 | Temp 97.9°F | Resp 16 | Wt 164.0 lb

## 2013-08-09 DIAGNOSIS — R7309 Other abnormal glucose: Secondary | ICD-10-CM

## 2013-08-09 DIAGNOSIS — R739 Hyperglycemia, unspecified: Secondary | ICD-10-CM

## 2013-08-09 DIAGNOSIS — J45909 Unspecified asthma, uncomplicated: Secondary | ICD-10-CM

## 2013-08-09 DIAGNOSIS — I10 Essential (primary) hypertension: Secondary | ICD-10-CM

## 2013-08-09 DIAGNOSIS — M545 Low back pain, unspecified: Secondary | ICD-10-CM

## 2013-08-09 NOTE — Assessment & Plan Note (Signed)
Watching  Wt loss

## 2013-08-09 NOTE — Assessment & Plan Note (Signed)
Continue with current prescription therapy as reflected on the Med list.  

## 2013-08-09 NOTE — Progress Notes (Signed)
Subjective:   HPI  Retired again! She is at [employer]  The patient is here to follow up on chronic thrombocythosis, headaches and chronic moderate LBP and B wrist arthritis symptoms not controlled with medicines - worse.   F/u R knee, R hip, thigh pain.   Review of Systems  Constitutional: Negative.  Negative for fever, chills, diaphoresis, activity change, appetite change, fatigue and unexpected weight change.  HENT: Negative for congestion, ear pain, facial swelling, hearing loss, mouth sores, nosebleeds, postnasal drip, rhinorrhea, sinus pressure, sneezing, sore throat, tinnitus and trouble swallowing.   Eyes: Negative for pain, discharge, redness, itching and visual disturbance.  Respiratory: Negative for cough, chest tightness, shortness of breath, wheezing and stridor.   Cardiovascular: Negative for chest pain, palpitations and leg swelling.  Gastrointestinal: Negative for nausea, diarrhea, constipation, blood in stool, abdominal distention, anal bleeding and rectal pain.  Genitourinary: Negative for dysuria, urgency, frequency, hematuria, flank pain, vaginal bleeding, vaginal discharge, difficulty urinating, genital sores and pelvic pain.  Musculoskeletal: Negative for arthralgias, back pain, gait problem, joint swelling, neck pain and neck stiffness.  Skin: Negative.  Negative for rash.  Neurological: Negative for dizziness, tremors, seizures, syncope, speech difficulty, weakness, numbness and headaches.  Hematological: Negative for adenopathy. Does not bruise/bleed easily.  Psychiatric/Behavioral: Negative for suicidal ideas, behavioral problems, sleep disturbance, dysphoric mood and decreased concentration. The patient is not nervous/anxious.    Wt Readings from Last 3 Encounters:  07/19/13 165 lb (74.844 kg)  06/30/13 163 lb 12.8 oz (74.299 kg)  03/29/13 162 lb (73.483 kg)   BP Readings from Last 3 Encounters:  07/19/13 128/70  06/30/13 160/73  03/29/13 122/69         Objective:   Physical Exam  Constitutional: She appears well-developed and well-nourished. No distress.  HENT:  Head: Normocephalic.  Right Ear: External ear normal.  Left Ear: External ear normal.  Nose: Nose normal.  Mouth/Throat: Oropharynx is clear and moist.  Eyes: Conjunctivae are normal. Pupils are equal, round, and reactive to light. Right eye exhibits no discharge. Left eye exhibits no discharge.  Neck: Normal range of motion. Neck supple. No JVD present. No tracheal deviation present. No thyromegaly present.  Cardiovascular: Normal rate, regular rhythm and normal heart sounds.   Pulmonary/Chest: No stridor. No respiratory distress. She has no wheezes.  Abdominal: Soft. Bowel sounds are normal. She exhibits no distension and no mass. There is no tenderness. There is no rebound and no guarding.  Musculoskeletal: She exhibits tenderness. She exhibits no edema.  B radial wrists with less tender firm swelling No cane LS, B hips are tender  Lymphadenopathy:    She has no cervical adenopathy.  Neurological: She displays normal reflexes. No cranial nerve deficit. She exhibits normal muscle tone. Coordination normal.  Skin: No rash noted. No erythema. No pallor.  Psychiatric: She has a normal mood and affect. Her behavior is normal. Judgment and thought content normal.   Lab Results  Component Value Date   WBC 5.2 06/30/2013   HGB 11.5* 06/30/2013   HCT 33.7* 06/30/2013   PLT 410* 06/30/2013   GLUCOSE 158* 06/30/2013   CHOL 145 04/22/2012   TRIG 105.0 04/22/2012   HDL 36.50* 04/22/2012   LDLCALC 88 04/22/2012   ALT 9 06/30/2013   AST 19 06/30/2013   NA 141 06/30/2013   K 3.6 06/30/2013   CL 105 07/08/2012   CREATININE 0.8 06/30/2013   BUN 9.8 06/30/2013   CO2 27 06/30/2013   TSH 0.59 04/22/2012  HGBA1C 6.2 12/16/2009        Assessment & Plan:

## 2013-08-09 NOTE — Progress Notes (Signed)
Pre visit review using our clinic review tool, if applicable. No additional management support is needed unless otherwise documented below in the visit note. 

## 2013-08-10 ENCOUNTER — Encounter: Payer: Self-pay | Admitting: Internal Medicine

## 2013-09-15 ENCOUNTER — Other Ambulatory Visit: Payer: Self-pay | Admitting: Oncology

## 2013-09-22 ENCOUNTER — Other Ambulatory Visit: Payer: Medicare Other

## 2013-11-15 ENCOUNTER — Encounter: Payer: Self-pay | Admitting: Internal Medicine

## 2013-11-15 ENCOUNTER — Ambulatory Visit (INDEPENDENT_AMBULATORY_CARE_PROVIDER_SITE_OTHER): Payer: Medicare Other | Admitting: Internal Medicine

## 2013-11-15 VITALS — BP 142/70 | HR 87 | Temp 98.4°F | Ht 64.0 in | Wt 164.1 lb

## 2013-11-15 DIAGNOSIS — R7309 Other abnormal glucose: Secondary | ICD-10-CM

## 2013-11-15 DIAGNOSIS — W57XXXA Bitten or stung by nonvenomous insect and other nonvenomous arthropods, initial encounter: Secondary | ICD-10-CM

## 2013-11-15 DIAGNOSIS — IMO0001 Reserved for inherently not codable concepts without codable children: Secondary | ICD-10-CM

## 2013-11-15 DIAGNOSIS — R739 Hyperglycemia, unspecified: Secondary | ICD-10-CM

## 2013-11-15 DIAGNOSIS — I10 Essential (primary) hypertension: Secondary | ICD-10-CM

## 2013-11-15 MED ORDER — DOXYCYCLINE HYCLATE 100 MG PO TABS: 100.0000 mg | ORAL_TABLET | Freq: Two times a day (BID) | ORAL | Status: DC

## 2013-11-15 NOTE — Progress Notes (Signed)
Pre visit review using our clinic review tool, if applicable. No additional management support is needed unless otherwise documented below in the visit note. 

## 2013-11-15 NOTE — Progress Notes (Signed)
Subjective:    Patient ID: Paula Griffith, female    DOB: August 10, 1945, 68 y.o.   MRN: [id number]  HPI  Here with c/o 3 days tick bite to left hip area, now with red,tender, swelling without ulcer, drainage, fever, red streaks. Some worse pain to ambulate.  Nothing makes better.  Pt denies chest pain, increased sob or doe, wheezing, orthopnea, PND, increased LE swelling, palpitations, dizziness or syncope.   Pt denies polydipsia, polyuria, Past Medical History  Diagnosis Date  . Colon polyps   . Diverticulosis of colon   . HTN (hypertension)   . LBP (low back pain)   . Osteoarthritis   . Asthma   . Allergic rhinitis   . Glucose intolerance (impaired glucose tolerance)   . PVD (peripheral vascular disease) 2011    Right Carotid Dr Scot Dock  . Retinal infarct 3500    embolic  . Thrombocytopathy 2011    Dr Ralene Ok   Past Surgical History  Procedure Laterality Date  . Abdominal hysterectomy  1989    reports that she has never smoked. She has never used smokeless tobacco. She reports that she does not drink alcohol or use illicit drugs. family history includes Arthritis in her mother; Coronary artery disease in an other family member; Diabetes in her sister; Early death (age of onset: 32) in her father; Lung cancer in an other family member; Pancreatic cancer in her mother. There is no history of Colon cancer or Stomach cancer. Allergies  Allergen Reactions  . Benazepril     cough  . Codeine Nausea Only  . Tramadol     Dizzy, nauseated   Current Outpatient Prescriptions on File Prior to Visit  Medication Sig Dispense Refill  . albuterol (PROAIR HFA) 108 (90 BASE) MCG/ACT inhaler Inhale 2 puffs into the lungs every 4 (four) hours as needed. For shortness of breath  1 Inhaler  11  . amLODipine (NORVASC) 5 MG tablet Take 1 tablet (5 mg total) by mouth daily.  30 tablet  11  . aspirin 325 MG EC tablet Take 325 mg by mouth daily after breakfast.        . budesonide-formoterol  (SYMBICORT) 80-4.5 MCG/ACT inhaler Inhale 2 puffs into the lungs 2 (two) times daily.  10.2 g  0  . Cholecalciferol 1000 UNITS tablet Take 1,000 Units by mouth daily.        . Glucosamine-Chondroit-Vit C-Mn (GLUCOSAMINE 1500 COMPLEX PO) Take by mouth.      . hydroxyurea (HYDREA) 500 MG capsule TAKE ONE CAPSULE BY MOUTH TWICE DAILY FOR 5 DAYS THEN TAKE ONE CAPSULE BY MOUTH ONCE DAILY FOR 2 DAYS THEN TAKE  AS DIRECTED.  60 capsule  1  . ibuprofen (ADVIL,MOTRIN) 600 MG tablet Take 1 tablet (600 mg total) by mouth every 8 (eight) hours as needed.  60 tablet  3  . loratadine (CLARITIN) 10 MG tablet TAKE ONE TABLET BY MOUTH EVERY DAY  90 tablet  2  . losartan-hydrochlorothiazide (HYZAAR) 100-25 MG per tablet TAKE ONE TABLET BY MOUTH EVERY DAY  90 tablet  2   No current facility-administered medications on file prior to visit.     Review of Systems All otherwise neg per pt     Objective:   Physical Exam BP 142/70  Pulse 87  Temp(Src) 98.4 F (36.9 C) (Oral)  Ht 5\' 4"  (1.626 m)  Wt 164 lb 2 oz (74.447 kg)  BMI 28.16 kg/m2  SpO2 98% VS noted,  Constitutional: Pt appears well-developed, well-nourished.  HENT: Head: NCAT.  Right Ear: External ear normal.  Left Ear: External ear normal.  Eyes: . Pupils are equal, round, and reactive to light. Conjunctivae and EOM are normal Neck: Normal range of motion. Neck supple.  Cardiovascular: Normal rate and regular rhythm.   Pulmonary/Chest: Effort normal and breath sounds normal.  Neurological: Pt is alert. Not confused , motor grossly intact Skin: Skin is warm. Left lateral hip with 3 cm area red/tender/swelling without fluctuance , ulcer or drainage Psychiatric: Pt behavior is normal. No agitation.        Assessment & Plan:

## 2013-11-15 NOTE — Patient Instructions (Signed)
Please take all new medication as prescribed  Please continue all other medications as before, and refills have been done if requested.  Please have the pharmacy call with any other refills you may need.   

## 2013-11-20 NOTE — Assessment & Plan Note (Signed)
Mild to mod, for antibx course,  to f/u any worsening symptoms or concerns 

## 2013-11-20 NOTE — Assessment & Plan Note (Signed)
Asympt, declines a1c today

## 2013-11-20 NOTE — Assessment & Plan Note (Signed)
Mild elev today, likely situational, stable overall by history and exam, recent data reviewed with pt, and pt to continue medical treatment as before,  to f/u any worsening symptoms or concerns BP Readings from Last 3 Encounters:  11/15/13 142/70  08/09/13 138/70  07/19/13 128/70

## 2013-11-29 ENCOUNTER — Other Ambulatory Visit: Payer: Self-pay

## 2013-11-29 DIAGNOSIS — Z1231 Encounter for screening mammogram for malignant neoplasm of breast: Secondary | ICD-10-CM

## 2013-12-05 ENCOUNTER — Ambulatory Visit
Admission: RE | Admit: 2013-12-05 | Discharge: 2013-12-05 | Disposition: A | Discharge status: Home / Self Care | Payer: Medicare Other | Source: Ambulatory Visit

## 2013-12-05 DIAGNOSIS — Z1231 Encounter for screening mammogram for malignant neoplasm of breast: Secondary | ICD-10-CM

## 2013-12-15 ENCOUNTER — Telehealth: Payer: Self-pay | Admitting: Internal Medicine

## 2013-12-15 ENCOUNTER — Other Ambulatory Visit (HOSPITAL_BASED_OUTPATIENT_CLINIC_OR_DEPARTMENT_OTHER): Payer: Medicare Other

## 2013-12-15 ENCOUNTER — Encounter: Payer: Self-pay | Admitting: Internal Medicine

## 2013-12-15 ENCOUNTER — Ambulatory Visit (HOSPITAL_BASED_OUTPATIENT_CLINIC_OR_DEPARTMENT_OTHER): Payer: Medicare Other | Admitting: Internal Medicine

## 2013-12-15 VITALS — BP 143/72 | HR 81 | Temp 98.7°F | Resp 19 | Ht 64.0 in | Wt 162.3 lb

## 2013-12-15 DIAGNOSIS — D759 Disease of blood and blood-forming organs, unspecified: Secondary | ICD-10-CM

## 2013-12-15 DIAGNOSIS — W57XXXA Bitten or stung by nonvenomous insect and other nonvenomous arthropods, initial encounter: Secondary | ICD-10-CM

## 2013-12-15 DIAGNOSIS — D473 Essential (hemorrhagic) thrombocythemia: Secondary | ICD-10-CM

## 2013-12-15 DIAGNOSIS — Q2571 Coarctation of pulmonary artery: Secondary | ICD-10-CM

## 2013-12-15 DIAGNOSIS — Q255 Atresia of pulmonary artery: Secondary | ICD-10-CM

## 2013-12-15 DIAGNOSIS — I10 Essential (primary) hypertension: Secondary | ICD-10-CM

## 2013-12-15 LAB — COMPREHENSIVE METABOLIC PANEL (CC13)
ALK PHOS: 58 U/L (ref 40–150)
ALT: 9 U/L (ref 0–55)
AST: 18 U/L (ref 5–34)
Albumin: 3.9 g/dL (ref 3.5–5.0)
Anion Gap: 11 mEq/L (ref 3–11)
BILIRUBIN TOTAL: 0.49 mg/dL (ref 0.20–1.20)
BUN: 13.9 mg/dL (ref 7.0–26.0)
CO2: 29 meq/L (ref 22–29)
Calcium: 9.5 mg/dL (ref 8.4–10.4)
Chloride: 103 mEq/L (ref 98–109)
Creatinine: 0.9 mg/dL (ref 0.6–1.1)
GLUCOSE: 160 mg/dL — AB (ref 70–140)
POTASSIUM: 4 meq/L (ref 3.5–5.1)
Sodium: 143 mEq/L (ref 136–145)
Total Protein: 7.3 g/dL (ref 6.4–8.3)

## 2013-12-15 LAB — CBC WITH DIFFERENTIAL/PLATELET
BASO%: 0.6 % (ref 0.0–2.0)
Basophils Absolute: 0 10*3/uL (ref 0.0–0.1)
EOS ABS: 0 10*3/uL (ref 0.0–0.5)
EOS%: 0.9 % (ref 0.0–7.0)
HCT: 35 % (ref 34.8–46.6)
HGB: 11.9 g/dL (ref 11.6–15.9)
LYMPH%: 29.4 % (ref 14.0–49.7)
MCH: 37.2 pg — ABNORMAL HIGH (ref 25.1–34.0)
MCHC: 33.9 g/dL (ref 31.5–36.0)
MCV: 109.6 fL — ABNORMAL HIGH (ref 79.5–101.0)
MONO#: 0.4 10*3/uL (ref 0.1–0.9)
MONO%: 7.9 % (ref 0.0–14.0)
NEUT%: 61.2 % (ref 38.4–76.8)
NEUTROS ABS: 3.4 10*3/uL (ref 1.5–6.5)
PLATELETS: 435 10*3/uL — AB (ref 145–400)
RBC: 3.19 10*6/uL — AB (ref 3.70–5.45)
RDW: 12.6 % (ref 11.2–14.5)
WBC: 5.5 10*3/uL (ref 3.9–10.3)
lymph#: 1.6 10*3/uL (ref 0.9–3.3)

## 2013-12-15 MED ORDER — HYDROXYUREA 500 MG PO CAPS: ORAL_CAPSULE | ORAL | Status: DC

## 2013-12-15 NOTE — Telephone Encounter (Signed)
gv adn printed appt sched and avs for pt for Sept and DEC

## 2013-12-15 NOTE — Progress Notes (Signed)
[facility] OFFICE PROGRESS NOTE  [name], MD [place] [address]  DIAGNOSIS: Tick bite, infected  THROMBOCYTOSIS - Plan: CBC with Differential, CBC with Differential, Comprehensive metabolic panel (Cmet) - [facility], Lactate dehydrogenase (LDH) - [facility]  Chief Complaint  Patient presents with  . Follow-up    CURRENT THERAPY: Hydrea 500 mg  Every day except 1,000 mg on M and Thurs.  Aspirin 81 mg daily.   INTERVAL HISTORY: Paula Griffith 68 y.o. female with presumed essential thrombocythemia currently maintained on aspirin and hydroxyurea is here for follow-up.  She was last seen by me on 06/30/2013.   She has been coming in every 2 months for CBCs and her platelet counts have been fairly well-maintained in the 200-300 range. She reports having a tick bite 2 weeks ago.  She finished a course of antibiotics.  She bit on her left thigh.   Paula Griffith denies any major symptomatology including any unusual bleeding, bruising or thrombotic episodes.  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 She denies any hospitalizations.  She is up-to-date for her mammogram and colonoscopy.   MEDICAL HISTORY: Past Medical History  Diagnosis Date  . Colon polyps   . Diverticulosis of colon   . HTN (hypertension)   . LBP (low back pain)   . Osteoarthritis   . Asthma   . Allergic rhinitis   . Glucose intolerance (impaired glucose tolerance)   . PVD (peripheral vascular disease) 2011    Right Carotid Dr Scot Dock  . Retinal infarct 3154    embolic  . Thrombocytopathy 2011    Dr Ralene Ok    INTERIM HISTORY: has THROMBOCYTOSIS; INSOMNIA-SLEEP DISORDER-UNSPEC; Retinal ischemia; VISUAL IMPAIRMENT; HYPERTENSION; PERIPHERAL VASCULAR DISEASE; ALLERGIC RHINITIS; ASTHMA; DIVERTICULOSIS, COLON; OSTEOARTHRITIS; LOW BACK PAIN; VERTIGO; SKIN RASH; Edema; SPRAIN&STRAIN OF UNSPECIFIED SITE OF KNEE&LEG; COLONIC POLYPS, HX OF; Arthritis pain, wrist;  Occlusion and stenosis of carotid artery without mention of cerebral infarction; Dermatitis; Hyperglycemia; Right hip pain; Ankle pain, right; and Tick bite, infected on her problem list.    ALLERGIES:  is allergic to benazepril; codeine; and tramadol.  MEDICATIONS: has a current medication list which includes the following prescription(s): albuterol, amlodipine, aspirin, budesonide-formoterol, cholecalciferol, glucosamine-chondroit-vit c-mn, hydroxyurea, ibuprofen, loratadine, and losartan-hydrochlorothiazide.  SURGICAL HISTORY:  Past Surgical History  Procedure Laterality Date  . Abdominal hysterectomy  1989   PROBLEM LIST: 1. Essential thrombocythemia with thrombocytosis going back to 2000. Mutations for JAK2 V617F, Exon 12, and MPL515 were negative. The patient had a bone marrow on 11/14/2009 which showed slightly hypercellular marrow for the patient's age, along with abundant iron stores. There was minimal reticulin fibrosis. Trichrome stains failed to show any definite collagenous fibrosis. The patient is maintained on hydroxyurea and aspirin since mid June 2011.  2. Right retinal artery occlusion and subsequent retinal infarct on 05/22/2009. The patient's vision has returned to near normal.  3. Peripheral vascular disease involving the right carotid artery detected 08/05/2009.  4. Hypertension  5. Allergic rhinitis  6. History of asthma  7. Adenomatous colonic polyps.  8. Diverticulosis  REVIEW OF SYSTEMS:   Constitutional: Denies fevers, chills or abnormal weight loss Eyes: Denies blurriness of vision Ears, nose, mouth, throat, and face: Denies mucositis or sore throat Respiratory: Denies cough, dyspnea or wheezes Cardiovascular: Denies palpitation, chest discomfort or lower extremity swelling Gastrointestinal:  Denies nausea, heartburn or change in bowel habits Skin: Denies abnormal skin rashes Lymphatics: Denies new lymphadenopathy or easy bruising  Neurological:Denies  numbness, tingling or new weaknesses Behavioral/Psych: Mood is stable, no new changes  All other systems were reviewed with the patient and are negative.  PHYSICAL EXAMINATION: ECOG PERFORMANCE STATUS: 0 - Asymptomatic  Blood pressure 143/72, pulse 81, temperature 98.7 F (37.1 C), temperature source Oral, resp. rate 19, height 5\' 4"  (1.626 m), weight 162 lb 4.8 oz (73.619 kg), SpO2 100.00%.  GENERAL:alert, no distress and comfortable; well developed and well nourished. SKIN: skin color, texture, turgor are normal, no rashes or significant lesions EYES: normal, Conjunctiva are pink and non-injected, sclera clear OROPHARYNX:no exudate, no erythema and lips, buccal mucosa, and tongue normal  NECK: supple, thyroid normal size, non-tender, without nodularity LYMPH:  no palpable lymphadenopathy in the cervical, axillary or supraclavicular LUNGS: clear to auscultation and percussion with normal breathing effort HEART: regular rate & rhythm and no murmurs and no lower extremity edema ABDOMEN:abdomen soft, non-tender and normal bowel sounds Musculoskeletal:no cyanosis of digits and no clubbing ; R ankle larger than Left with slight tenderness to palpation.  NEURO: alert & oriented x 3 with fluent speech, no focal motor/sensory deficits   LABORATORY DATA: Results for orders placed in visit on 12/15/13 (from the past 48 hour(s))  COMPREHENSIVE METABOLIC PANEL (IP38)     Status: Abnormal   Collection Time    12/15/13  9:30 AM      Result Value Ref Range   Sodium 143  136 - 145 mEq/L   Potassium 4.0  3.5 - 5.1 mEq/L   Chloride 103  98 - 109 mEq/L   CO2 29  22 - 29 mEq/L   Glucose 160 (*) 70 - 140 mg/dl   BUN 13.9  7.0 - 26.0 mg/dL   Creatinine 0.9  0.6 - 1.1 mg/dL   Total Bilirubin 0.49  0.20 - 1.20 mg/dL   Alkaline Phosphatase 58  40 - 150 U/L   AST 18  5 - 34 U/L   ALT 9  0 - 55 U/L   Total Protein 7.3  6.4 - 8.3 g/dL   Albumin 3.9  3.5 - 5.0 g/dL   Calcium 9.5  8.4 - 10.4 mg/dL    Anion Gap 11  3 - 11 mEq/L  CBC WITH DIFFERENTIAL     Status: Abnormal   Collection Time    12/15/13  9:31 AM      Result Value Ref Range   WBC 5.5  3.9 - 10.3 10e3/uL   NEUT# 3.4  1.5 - 6.5 10e3/uL   HGB 11.9  11.6 - 15.9 g/dL   HCT 35.0  34.8 - 46.6 %   Platelets 435 (*) 145 - 400 10e3/uL   MCV 109.6 (*) 79.5 - 101.0 fL   MCH 37.2 (*) 25.1 - 34.0 pg   MCHC 33.9  31.5 - 36.0 g/dL   RBC 3.19 (*) 3.70 - 5.45 10e6/uL   RDW 12.6  11.2 - 14.5 %   lymph# 1.6  0.9 - 3.3 10e3/uL   MONO# 0.4  0.1 - 0.9 10e3/uL   Eosinophils Absolute 0.0  0.0 - 0.5 10e3/uL   Basophils Absolute 0.0  0.0 - 0.1 10e3/uL   NEUT% 61.2  38.4 - 76.8 %   LYMPH% 29.4  14.0 - 49.7 %   MONO% 7.9  0.0 - 14.0 %   EOS% 0.9  0.0 - 7.0 %   BASO% 0.6  0.0 - 2.0 %   Labs:  Lab Results  Component Value Date   WBC 5.5 12/15/2013   HGB  11.9 12/15/2013   HCT 35.0 12/15/2013   MCV 109.6* 12/15/2013   PLT 435* 12/15/2013   NEUTROABS 3.4 12/15/2013      Chemistry      Component Value Date/Time   NA 143 12/15/2013 0930   NA 138 04/22/2012 0839   K 4.0 12/15/2013 0930   K 3.7 04/22/2012 0839   CL 105 07/08/2012 0838   CL 103 04/22/2012 0839   CO2 29 12/15/2013 0930   CO2 26 04/22/2012 0839   BUN 13.9 12/15/2013 0930   BUN 18 04/22/2012 0839   CREATININE 0.9 12/15/2013 0930   CREATININE 0.9 04/22/2012 0839      Component Value Date/Time   CALCIUM 9.5 12/15/2013 0930   CALCIUM 9.4 04/22/2012 0839   ALKPHOS 58 12/15/2013 0930   ALKPHOS 46 04/22/2012 0839   AST 18 12/15/2013 0930   AST 18 04/22/2012 0839   ALT 9 12/15/2013 0930   ALT 8 04/22/2012 0839   BILITOT 0.49 12/15/2013 0930   BILITOT 0.7 04/22/2012 0839      CBC:  Recent Labs Lab 12/15/13 0931  WBC 5.5  NEUTROABS 3.4  HGB 11.9  HCT 35.0  MCV 109.6*  PLT 435*   Microbiology No results found for this or any previous visit (from the past 240 hour(s)).  Studies:  No results found.   RADIOGRAPHIC STUDIES: No results found.  ASSESSMENT: Paula Griffith  68 y.o. female with a history of Tick bite, infected  THROMBOCYTOSIS - Plan: CBC with Differential, CBC with Differential, Comprehensive metabolic panel (Cmet) - [facility], Lactate dehydrogenase (LDH) - [facility]   PLAN:  1. Thrombocytosis.  --Paula Griffith seems to be doing  well at the present time with good control of her platelet count. She has had no thrombotic or bleeding problems. She will stay on the same dose of Hydroxyurea, which is 500 mg twice daily on Mondays and Thursdays and 500 mg daily the other 5 days of the week. (She was provided a prescription today). She takes 9 capsules of Hydroxyurea per week. She is also on Aspirin 325 mg daily.   2. Follow-up. --We will check CBC in 3 months and plan to see Paula Griffith again in 6 months at which time we will check CBC and chemistries. Paula Griffith is encouraged to contact us in the interim if she has any questions or concerns.  All questions were answered. The patient knows to call the clinic with any problems, questions or concerns. We can certainly see the patient much sooner if necessary.  I spent 10 minutes counseling the patient face to face. The total time spent in the appointment was 15 minutes.    CHISM, DAVID, MD 12/15/2013 2:58 PM

## 2013-12-30 ENCOUNTER — Other Ambulatory Visit: Payer: Self-pay | Admitting: Internal Medicine

## 2014-02-19 ENCOUNTER — Other Ambulatory Visit: Payer: Self-pay | Admitting: Internal Medicine

## 2014-03-09 ENCOUNTER — Other Ambulatory Visit (HOSPITAL_BASED_OUTPATIENT_CLINIC_OR_DEPARTMENT_OTHER): Payer: Medicare Other

## 2014-03-09 DIAGNOSIS — D759 Disease of blood and blood-forming organs, unspecified: Secondary | ICD-10-CM

## 2014-03-09 LAB — CBC WITH DIFFERENTIAL/PLATELET
BASO%: 0.4 % (ref 0.0–2.0)
Basophils Absolute: 0 10*3/uL (ref 0.0–0.1)
EOS ABS: 0 10*3/uL (ref 0.0–0.5)
EOS%: 0.9 % (ref 0.0–7.0)
HCT: 33.2 % — ABNORMAL LOW (ref 34.8–46.6)
HGB: 11.4 g/dL — ABNORMAL LOW (ref 11.6–15.9)
LYMPH%: 31.6 % (ref 14.0–49.7)
MCH: 35.8 pg — ABNORMAL HIGH (ref 25.1–34.0)
MCHC: 34.3 g/dL (ref 31.5–36.0)
MCV: 104.4 fL — ABNORMAL HIGH (ref 79.5–101.0)
MONO#: 0.4 10*3/uL (ref 0.1–0.9)
MONO%: 9.5 % (ref 0.0–14.0)
NEUT%: 57.6 % (ref 38.4–76.8)
NEUTROS ABS: 2.7 10*3/uL (ref 1.5–6.5)
Platelets: 398 10*3/uL (ref 145–400)
RBC: 3.18 10*6/uL — AB (ref 3.70–5.45)
RDW: 13.2 % (ref 11.2–14.5)
WBC: 4.6 10*3/uL (ref 3.9–10.3)
lymph#: 1.5 10*3/uL (ref 0.9–3.3)

## 2014-03-15 DIAGNOSIS — Z0279 Encounter for issue of other medical certificate: Secondary | ICD-10-CM

## 2014-03-23 ENCOUNTER — Other Ambulatory Visit: Payer: Self-pay | Admitting: Internal Medicine

## 2014-03-27 ENCOUNTER — Other Ambulatory Visit: Payer: Self-pay | Admitting: *Deleted

## 2014-03-27 DIAGNOSIS — D759 Disease of blood and blood-forming organs, unspecified: Secondary | ICD-10-CM

## 2014-03-27 MED ORDER — HYDROXYUREA 500 MG PO CAPS: ORAL_CAPSULE | ORAL | Status: DC

## 2014-04-12 ENCOUNTER — Other Ambulatory Visit: Payer: Self-pay | Admitting: Internal Medicine

## 2014-04-23 ENCOUNTER — Telehealth: Payer: Self-pay

## 2014-04-23 ENCOUNTER — Ambulatory Visit (INDEPENDENT_AMBULATORY_CARE_PROVIDER_SITE_OTHER): Payer: Medicare Other | Admitting: Internal Medicine

## 2014-04-23 ENCOUNTER — Other Ambulatory Visit (INDEPENDENT_AMBULATORY_CARE_PROVIDER_SITE_OTHER): Payer: Medicare Other

## 2014-04-23 ENCOUNTER — Encounter: Payer: Self-pay | Admitting: Internal Medicine

## 2014-04-23 VITALS — BP 140/78 | HR 84 | Temp 98.7°F | Resp 16 | Wt 161.0 lb

## 2014-04-23 DIAGNOSIS — M8949 Other hypertrophic osteoarthropathy, multiple sites: Secondary | ICD-10-CM

## 2014-04-23 DIAGNOSIS — M159 Polyosteoarthritis, unspecified: Secondary | ICD-10-CM

## 2014-04-23 DIAGNOSIS — R202 Paresthesia of skin: Secondary | ICD-10-CM

## 2014-04-23 DIAGNOSIS — Z23 Encounter for immunization: Secondary | ICD-10-CM

## 2014-04-23 DIAGNOSIS — D759 Disease of blood and blood-forming organs, unspecified: Secondary | ICD-10-CM

## 2014-04-23 DIAGNOSIS — M150 Primary generalized (osteo)arthritis: Secondary | ICD-10-CM

## 2014-04-23 DIAGNOSIS — I10 Essential (primary) hypertension: Secondary | ICD-10-CM

## 2014-04-23 DIAGNOSIS — M5440 Lumbago with sciatica, unspecified side: Secondary | ICD-10-CM

## 2014-04-23 LAB — BASIC METABOLIC PANEL
BUN: 11 mg/dL (ref 6–23)
CALCIUM: 9.3 mg/dL (ref 8.4–10.5)
CO2: 26 mEq/L (ref 19–32)
Chloride: 101 mEq/L (ref 96–112)
Creatinine, Ser: 0.8 mg/dL (ref 0.4–1.2)
GFR: 92.93 mL/min (ref >60.00)
GLUCOSE: 138 mg/dL — AB (ref 70–99)
Potassium: 3.1 mEq/L — ABNORMAL LOW (ref 3.5–5.1)
Sodium: 139 mEq/L (ref 135–145)

## 2014-04-23 LAB — HEPATIC FUNCTION PANEL
ALBUMIN: 3.4 g/dL — AB (ref 3.5–5.2)
ALT: 8 U/L (ref 0–35)
AST: 23 U/L (ref 0–37)
Alkaline Phosphatase: 49 U/L (ref 39–117)
BILIRUBIN TOTAL: 0.6 mg/dL (ref 0.2–1.2)
Bilirubin, Direct: 0.1 mg/dL (ref 0.0–0.3)
Total Protein: 7.4 g/dL (ref 6.0–8.3)

## 2014-04-23 LAB — SEDIMENTATION RATE: Sed Rate: 16 mm/hr (ref 0–22)

## 2014-04-23 LAB — RHEUMATOID FACTOR: Rhuematoid fact SerPl-aCnc: <10 IU/mL (ref <=14)

## 2014-04-23 LAB — VITAMIN B12: Vitamin B-12: 393 pg/mL (ref 211–911)

## 2014-04-23 LAB — URIC ACID: Uric Acid, Serum: 5.7 mg/dL (ref 2.4–7.0)

## 2014-04-23 MED ORDER — IBUPROFEN 600 MG PO TABS: 600.0000 mg | ORAL_TABLET | Freq: Two times a day (BID) | ORAL | Status: DC | PRN

## 2014-04-23 MED ORDER — POTASSIUM CHLORIDE ER 10 MEQ PO TBCR: 10.0000 meq | EXTENDED_RELEASE_TABLET | Freq: Every day | ORAL | Status: DC

## 2014-04-23 NOTE — Assessment & Plan Note (Signed)
Using a cane 

## 2014-04-23 NOTE — Assessment & Plan Note (Signed)
Continue with current prescription therapy as reflected on the Med list.  

## 2014-04-23 NOTE — Telephone Encounter (Signed)
We received [facility] paperwork for "Examination for Housebound status or permanent need for regular aid and attendance". We will fax this to Dr Alain Marion for him to address.

## 2014-04-23 NOTE — Progress Notes (Signed)
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  C/o pain in joints R>L wrists, hands; LS spine R ankle -- stiffness x 1 h; all flared up since August 2015 The patient is here to follow up on chronic thrombocythosis, headaches and chronic moderate LBP and B wrist arthritis symptoms not controlled with medicines - worse. F/u R knee, R hip, thigh pain.   Review of Systems  Constitutional: Negative.  Negative for fever, chills, diaphoresis, activity change, appetite change, fatigue and unexpected weight change.  HENT: Negative for congestion, ear pain, facial swelling, hearing loss, mouth sores, nosebleeds, postnasal drip, rhinorrhea, sinus pressure, sneezing, sore throat, tinnitus and trouble swallowing.   Eyes: Negative for pain, discharge, redness, itching and visual disturbance.  Respiratory: Negative for cough, chest tightness, shortness of breath, wheezing and stridor.   Cardiovascular: Negative for chest pain, palpitations and leg swelling.  Gastrointestinal: Negative for nausea, diarrhea, constipation, blood in stool, abdominal distention, anal bleeding and rectal pain.  Genitourinary: Negative for dysuria, urgency, frequency, hematuria, flank pain, vaginal bleeding, vaginal discharge, difficulty urinating, genital sores and pelvic pain.  Musculoskeletal: Positive for arthralgias, back pain, joint swelling, neck pain and neck stiffness. Negative for gait problem.  Skin: Negative.  Negative for rash.  Neurological: Negative for dizziness, tremors, seizures, syncope, speech difficulty, weakness, numbness and headaches.  Hematological: Negative for adenopathy. Does not bruise/bleed easily.  Psychiatric/Behavioral: Negative for suicidal ideas, behavioral problems, sleep disturbance, dysphoric mood and decreased concentration. The patient is not nervous/anxious.    Wt Readings from Last 3 Encounters:  04/23/14 161 lb (73.029 kg)  12/15/13 162 lb 4.8 oz (73.619  kg)  11/15/13 164 lb 2 oz (74.447 kg)   BP Readings from Last 3 Encounters:  04/23/14 140/78  12/15/13 143/72  11/15/13 142/70        Objective:   Physical Exam  Constitutional: She appears well-developed and well-nourished. No distress.  HENT:  Head: Normocephalic.  Right Ear: External ear normal.  Left Ear: External ear normal.  Nose: Nose normal.  Mouth/Throat: Oropharynx is clear and moist.  Eyes: Conjunctivae are normal. Pupils are equal, round, and reactive to light. Right eye exhibits no discharge. Left eye exhibits no discharge.  Neck: Normal range of motion. Neck supple. No JVD present. No tracheal deviation present. No thyromegaly present.  Cardiovascular: Normal rate, regular rhythm and normal heart sounds.   Pulmonary/Chest: No stridor. No respiratory distress. She has no wheezes.  Abdominal: Soft. Bowel sounds are normal. She exhibits no distension and no mass. There is no tenderness. There is no rebound and no guarding.  Musculoskeletal: She exhibits tenderness. She exhibits no edema.  B radial wrists with tender firm swelling; B MCPs - tender, swollen No cane LS, B hips are tender  Lymphadenopathy:    She has no cervical adenopathy.  Neurological: She displays normal reflexes. No cranial nerve deficit. She exhibits normal muscle tone. Coordination normal.  Skin: No rash noted. No erythema. No pallor.  Psychiatric: She has a normal mood and affect. Her behavior is normal. Judgment and thought content normal.   Lab Results  Component Value Date   WBC 4.6 03/09/2014   HGB 11.4* 03/09/2014   HCT 33.2* 03/09/2014   PLT 398 03/09/2014   GLUCOSE 160* 12/15/2013   CHOL 145 04/22/2012   TRIG 105.0 04/22/2012   HDL 36.50* 04/22/2012   LDLCALC 88 04/22/2012   ALT 9 12/15/2013   AST 18 12/15/2013   NA 143 12/15/2013  K 4.0 12/15/2013   CL 105 07/08/2012   CREATININE 0.9 12/15/2013   BUN 13.9 12/15/2013   CO2 29 12/15/2013   TSH 0.59 04/22/2012   HGBA1C 6.2 12/16/2009         Assessment & Plan:

## 2014-04-23 NOTE — Assessment & Plan Note (Signed)
10/13, 10/15 worse 2013 X ray:  IMPRESSION:  Osteoarthritis of the right wrist and hand, most pronounced along  the radial aspect of the wrist and first [place] joint.  Original Report Authenticated By: Jerilynn Mages. Daryll Brod, M.D.  Labs

## 2014-04-23 NOTE — Progress Notes (Signed)
Pre visit review using our clinic review tool, if applicable. No additional management support is needed unless otherwise documented below in the visit note. 

## 2014-05-23 ENCOUNTER — Telehealth: Payer: Self-pay | Admitting: Hematology

## 2014-05-23 NOTE — Telephone Encounter (Signed)
moved 12/4 appt w/CP2 to 12/15 w/YF. not able to reach pt at home phone or lm. called cell and lmonvm for pt re change w/new appt d/t. schedule mailed.

## 2014-06-01 ENCOUNTER — Ambulatory Visit: Payer: Medicare Other

## 2014-06-01 ENCOUNTER — Other Ambulatory Visit: Payer: Medicare Other

## 2014-06-11 ENCOUNTER — Other Ambulatory Visit: Payer: Self-pay | Admitting: *Deleted

## 2014-06-11 DIAGNOSIS — D759 Disease of blood and blood-forming organs, unspecified: Secondary | ICD-10-CM

## 2014-06-12 ENCOUNTER — Telehealth: Payer: Self-pay | Admitting: Hematology

## 2014-06-12 ENCOUNTER — Encounter: Payer: Self-pay | Admitting: Hematology

## 2014-06-12 ENCOUNTER — Ambulatory Visit (HOSPITAL_BASED_OUTPATIENT_CLINIC_OR_DEPARTMENT_OTHER): Payer: Medicare Other | Admitting: Hematology

## 2014-06-12 ENCOUNTER — Other Ambulatory Visit (HOSPITAL_BASED_OUTPATIENT_CLINIC_OR_DEPARTMENT_OTHER): Payer: Medicare Other

## 2014-06-12 VITALS — BP 145/62 | HR 89 | Temp 98.3°F | Resp 18 | Ht 64.0 in | Wt 162.9 lb

## 2014-06-12 DIAGNOSIS — I10 Essential (primary) hypertension: Secondary | ICD-10-CM

## 2014-06-12 DIAGNOSIS — M069 Rheumatoid arthritis, unspecified: Secondary | ICD-10-CM

## 2014-06-12 DIAGNOSIS — D471 Chronic myeloproliferative disease: Secondary | ICD-10-CM | POA: Insufficient documentation

## 2014-06-12 DIAGNOSIS — D473 Essential (hemorrhagic) thrombocythemia: Secondary | ICD-10-CM

## 2014-06-12 DIAGNOSIS — D759 Disease of blood and blood-forming organs, unspecified: Secondary | ICD-10-CM

## 2014-06-12 LAB — COMPREHENSIVE METABOLIC PANEL (CC13)
ALBUMIN: 3.6 g/dL (ref 3.5–5.0)
ALK PHOS: 65 U/L (ref 40–150)
ALT: 14 U/L (ref 0–55)
AST: 16 U/L (ref 5–34)
Anion Gap: 12 mEq/L — ABNORMAL HIGH (ref 3–11)
BUN: 13.1 mg/dL (ref 7.0–26.0)
CALCIUM: 8.9 mg/dL (ref 8.4–10.4)
CO2: 25 meq/L (ref 22–29)
Chloride: 103 mEq/L (ref 98–109)
Creatinine: 1 mg/dL (ref 0.6–1.1)
EGFR: 69 mL/min/{1.73_m2} — AB (ref >90)
Glucose: 198 mg/dl — ABNORMAL HIGH (ref 70–140)
POTASSIUM: 3.9 meq/L (ref 3.5–5.1)
SODIUM: 140 meq/L (ref 136–145)
Total Bilirubin: 0.41 mg/dL (ref 0.20–1.20)
Total Protein: 6.6 g/dL (ref 6.4–8.3)

## 2014-06-12 LAB — CBC WITH DIFFERENTIAL/PLATELET
BASO%: 0.4 % (ref 0.0–2.0)
Basophils Absolute: 0 10*3/uL (ref 0.0–0.1)
EOS%: 0.1 % (ref 0.0–7.0)
Eosinophils Absolute: 0 10*3/uL (ref 0.0–0.5)
HEMATOCRIT: 34.4 % — AB (ref 34.8–46.6)
HGB: 11.4 g/dL — ABNORMAL LOW (ref 11.6–15.9)
LYMPH#: 1 10*3/uL (ref 0.9–3.3)
LYMPH%: 10.7 % — ABNORMAL LOW (ref 14.0–49.7)
MCH: 36.5 pg — ABNORMAL HIGH (ref 25.1–34.0)
MCHC: 33.3 g/dL (ref 31.5–36.0)
MCV: 109.8 fL — ABNORMAL HIGH (ref 79.5–101.0)
MONO#: 0.3 10*3/uL (ref 0.1–0.9)
MONO%: 3.6 % (ref 0.0–14.0)
NEUT#: 8.1 10*3/uL — ABNORMAL HIGH (ref 1.5–6.5)
NEUT%: 85.2 % — AB (ref 38.4–76.8)
Platelets: 435 10*3/uL — ABNORMAL HIGH (ref 145–400)
RBC: 3.13 10*6/uL — AB (ref 3.70–5.45)
RDW: 12.6 % (ref 11.2–14.5)
WBC: 9.5 10*3/uL (ref 3.9–10.3)

## 2014-06-12 LAB — LACTATE DEHYDROGENASE (CC13): LDH: 207 U/L (ref 125–245)

## 2014-06-12 MED ORDER — HYDROXYUREA 500 MG PO CAPS: ORAL_CAPSULE | ORAL | Status: DC

## 2014-06-12 NOTE — Telephone Encounter (Signed)
Gave avs & cal for March & June.

## 2014-06-12 NOTE — Progress Notes (Signed)
[facility] OFFICE PROGRESS NOTE  [name], MD [place] [address]  DIAGNOSIS: Essential thrombocythemia - Plan: hydroxyurea (HYDREA) 500 MG capsule, CBC with Differential, Comprehensive metabolic panel (Cmet) - [facility]  MPN (myeloproliferative neoplasm)  PROBLEM LIST: 1. Essential thrombocythemia with thrombocytosis going back to 2000. Mutations for JAK2 V617F, Exon 12, and MPL515 were negative. The patient had a bone marrow on 11/14/2009 which showed slightly hypercellular marrow for the patient's age, along with abundant iron stores. There was minimal reticulin fibrosis. Trichrome stains failed to show any definite collagenous fibrosis. The patient is maintained on hydroxyurea and aspirin since mid June 2011.  2. Right retinal artery occlusion and subsequent retinal infarct on 05/22/2009. The patient's vision has returned to near normal.  3. Peripheral vascular disease involving the right carotid artery detected 08/05/2009.  4. Hypertension  5. Allergic rhinitis  6. History of asthma  7. Adenomatous colonic polyps.  8. Diverticulosis  Chief complaint: Follow-up  CURRENT THERAPY: Hydrea 500 mg  Every day except 1,000 mg on M and Thurs.  Aspirin 325 mg daily.   INTERVAL HISTORY: Paula Griffith 68 y.o. female with presumed essential thrombocythemia currently maintained on aspirin and hydroxyurea is here for follow-up.  She was previously under Dr. Boyce Medici care and was last seen on 12/15/2013.   She is doing well overall. She has been complying with her Hydrea and aspirin. She has quite severe rheumatoid arthritis has been on prednisone for the past month for RA, and is currently tapering it off. She is otherwise doing well overall. She denies any other new pain, no fever, chills, night sweats or weight loss. Her appetite is good, no abdominal bloating or any other GI symptoms.  MEDICAL HISTORY: Past Medical History  Diagnosis Date  . Colon polyps   .  Diverticulosis of colon   . HTN (hypertension)   . LBP (low back pain)   . Osteoarthritis   . Asthma   . Allergic rhinitis   . Glucose intolerance (impaired glucose tolerance)   . PVD (peripheral vascular disease) 2011    Right Carotid Dr Scot Dock  . Retinal infarct 8315    embolic  . Thrombocytopathy 2011    Dr Ralene Ok      ALLERGIES:  is allergic to benazepril; codeine; and tramadol.  MEDICATIONS: has a current medication list which includes the following prescription(s): prednisone, albuterol, amlodipine, aspirin, budesonide-formoterol, cholecalciferol, glucosamine-chondroit-vit c-mn, hydroxyurea, ibuprofen, loratadine, losartan-hydrochlorothiazide, and potassium chloride.  SURGICAL HISTORY:  Past Surgical History  Procedure Laterality Date  . Abdominal hysterectomy  1989    REVIEW OF SYSTEMS:   Constitutional: Denies fevers, chills or abnormal weight loss Eyes: Denies blurriness of vision Ears, nose, mouth, throat, and face: Denies mucositis or sore throat Respiratory: Denies cough, dyspnea or wheezes Cardiovascular: Denies palpitation, chest discomfort or lower extremity swelling Gastrointestinal:  Denies nausea, heartburn or change in bowel habits Skin: Denies abnormal skin rashes Lymphatics: Denies new lymphadenopathy or easy bruising Neurological:Denies numbness, tingling or new weaknesses Behavioral/Psych: Mood is stable, no new changes  All other systems were reviewed with the patient and are negative.  PHYSICAL EXAMINATION: ECOG PERFORMANCE STATUS: 0 - Asymptomatic  Blood pressure 145/62, pulse 89, temperature 98.3 F (36.8 C), temperature source Oral, resp. rate 18, height 5\' 4"  (1.626 m), weight 162 lb 14.4 oz (73.891 kg).  GENERAL:alert, no distress and comfortable; well developed and well nourished. SKIN: skin color, texture, turgor are normal, no rashes or significant lesions EYES: normal, Conjunctiva are  pink and non-injected, sclera  clear OROPHARYNX:no exudate, no erythema and lips, buccal mucosa, and tongue normal  NECK: supple, thyroid normal size, non-tender, without nodularity LYMPH:  no palpable lymphadenopathy in the cervical, axillary or supraclavicular LUNGS: clear to auscultation and percussion with normal breathing effort HEART: regular rate & rhythm and no murmurs and no lower extremity edema ABDOMEN:abdomen soft, non-tender and normal bowel sounds Musculoskeletal:no cyanosis of digits and no clubbing ; R ankle larger than Left with slight tenderness to palpation.  NEURO: alert & oriented x 3 with fluent speech, no focal motor/sensory deficits   LABORATORY DATA: CBC Latest Ref Rng 06/12/2014 03/09/2014 12/15/2013  WBC 3.9 - 10.3 10e3/uL 9.5 4.6 5.5  Hemoglobin 11.6 - 15.9 g/dL 11.4(L) 11.4(L) 11.9  Hematocrit 34.8 - 46.6 % 34.4(L) 33.2(L) 35.0  Platelets 145 - 400 10e3/uL 435(H) 398 435(H)    CMP Latest Ref Rng 06/12/2014 04/23/2014 12/15/2013  Glucose 70 - 140 mg/dl 198(H) 138(H) 160(H)  BUN 7.0 - 26.0 mg/dL 13.1 11 13.9  Creatinine 0.6 - 1.1 mg/dL 1.0 0.8 0.9  Sodium 136 - 145 mEq/L 140 139 143  Potassium 3.5 - 5.1 mEq/L 3.9 3.1(L) 4.0  Chloride 96 - 112 mEq/L - 101 -  CO2 22 - 29 mEq/L 25 26 29   Calcium 8.4 - 10.4 mg/dL 8.9 9.3 9.5  Total Protein 6.4 - 8.3 g/dL 6.6 7.4 7.3  Total Bilirubin 0.20 - 1.20 mg/dL 0.41 0.6 0.49  Alkaline Phos 40 - 150 U/L 65 49 58  AST 5 - 34 U/L 16 23 18   ALT 0 - 55 U/L 14 8 9       RADIOGRAPHIC STUDIES: No results found.  ASSESSMENT: Paula Griffith 68 y.o. female with a history of Essential thrombocythemia    1. 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  --Paula Griffith seems to be doing  well at the present time with good control of her platelet count. She has had no thrombotic or bleeding problems. -Her pre-count is 435K today.  - She will stay on the same dose of Hydroxyurea, which is 500 mg twice daily on Mondays and  Thursdays and 500 mg daily the other 5 days of the week. (She was provided a prescription today). She takes 9 capsules of Hydroxyurea per week. She is also on Aspirin 325 mg daily.   2. RA -She will continue follow-up with her rheumatologist.  3. Hypertension and other medical problems She'll continue follow-up with her primary care physician  Plan 1. Repeat lab in 3 month and 6 month 2. Return in 6 month for follow up  All questions were answered. The patient knows to call the clinic with any problems, questions or concerns. We can certainly see the patient much sooner if necessary.  I spent 15 minutes counseling the patient face to face. The total time spent in the appointment was 20 minutes.     Truitt Merle, MD 06/12/2014 6:21 PM

## 2014-07-01 ENCOUNTER — Other Ambulatory Visit: Payer: Self-pay | Admitting: Internal Medicine

## 2014-07-08 ENCOUNTER — Other Ambulatory Visit: Payer: Self-pay | Admitting: Internal Medicine

## 2014-07-25 ENCOUNTER — Ambulatory Visit: Payer: Medicare Other | Admitting: Internal Medicine

## 2014-09-11 ENCOUNTER — Other Ambulatory Visit (HOSPITAL_BASED_OUTPATIENT_CLINIC_OR_DEPARTMENT_OTHER): Payer: Medicare Other

## 2014-09-11 DIAGNOSIS — D473 Essential (hemorrhagic) thrombocythemia: Secondary | ICD-10-CM | POA: Diagnosis not present

## 2014-09-11 LAB — COMPREHENSIVE METABOLIC PANEL (CC13)
ALBUMIN: 3.9 g/dL (ref 3.5–5.0)
ALK PHOS: 48 U/L (ref 40–150)
ALT: 8 U/L (ref 0–55)
AST: 17 U/L (ref 5–34)
Anion Gap: 12 mEq/L — ABNORMAL HIGH (ref 3–11)
BILIRUBIN TOTAL: 0.49 mg/dL (ref 0.20–1.20)
BUN: 8.7 mg/dL (ref 7.0–26.0)
CO2: 26 mEq/L (ref 22–29)
Calcium: 9.2 mg/dL (ref 8.4–10.4)
Chloride: 104 mEq/L (ref 98–109)
Creatinine: 0.9 mg/dL (ref 0.6–1.1)
EGFR: 81 mL/min/{1.73_m2} — AB (ref >90)
GLUCOSE: 168 mg/dL — AB (ref 70–140)
POTASSIUM: 3.6 meq/L (ref 3.5–5.1)
SODIUM: 141 meq/L (ref 136–145)
Total Protein: 7.1 g/dL (ref 6.4–8.3)

## 2014-09-11 LAB — CBC WITH DIFFERENTIAL/PLATELET
BASO%: 0.7 % (ref 0.0–2.0)
BASOS ABS: 0 10*3/uL (ref 0.0–0.1)
EOS ABS: 0.1 10*3/uL (ref 0.0–0.5)
EOS%: 1.2 % (ref 0.0–7.0)
HEMATOCRIT: 33.2 % — AB (ref 34.8–46.6)
HEMOGLOBIN: 11.6 g/dL (ref 11.6–15.9)
LYMPH#: 1.5 10*3/uL (ref 0.9–3.3)
LYMPH%: 34.3 % (ref 14.0–49.7)
MCH: 37.3 pg — ABNORMAL HIGH (ref 25.1–34.0)
MCHC: 34.9 g/dL (ref 31.5–36.0)
MCV: 106.8 fL — ABNORMAL HIGH (ref 79.5–101.0)
MONO#: 0.4 10*3/uL (ref 0.1–0.9)
MONO%: 8.3 % (ref 0.0–14.0)
NEUT%: 55.5 % (ref 38.4–76.8)
NEUTROS ABS: 2.4 10*3/uL (ref 1.5–6.5)
Platelets: 413 10*3/uL — ABNORMAL HIGH (ref 145–400)
RBC: 3.11 10*6/uL — ABNORMAL LOW (ref 3.70–5.45)
RDW: 13 % (ref 11.2–14.5)
WBC: 4.3 10*3/uL (ref 3.9–10.3)

## 2014-09-24 ENCOUNTER — Ambulatory Visit (INDEPENDENT_AMBULATORY_CARE_PROVIDER_SITE_OTHER): Payer: Medicare Other | Admitting: Internal Medicine

## 2014-09-24 ENCOUNTER — Encounter: Payer: Self-pay | Admitting: Internal Medicine

## 2014-09-24 VITALS — BP 140/80 | HR 90 | Wt 163.0 lb

## 2014-09-24 DIAGNOSIS — M5442 Lumbago with sciatica, left side: Secondary | ICD-10-CM | POA: Diagnosis not present

## 2014-09-24 DIAGNOSIS — M5432 Sciatica, left side: Secondary | ICD-10-CM

## 2014-09-24 MED ORDER — PREDNISONE 10 MG PO TABS: 10.0000 mg | ORAL_TABLET | Freq: Every day | ORAL | Status: DC

## 2014-09-24 MED ORDER — PROMETHAZINE HCL 12.5 MG PO TABS: 12.5000 mg | ORAL_TABLET | Freq: Four times a day (QID) | ORAL | Status: DC | PRN

## 2014-09-24 MED ORDER — OXYCODONE HCL 5 MG PO TABS: 5.0000 mg | ORAL_TABLET | Freq: Four times a day (QID) | ORAL | Status: DC | PRN

## 2014-09-24 NOTE — Assessment & Plan Note (Signed)
10/31/105 LS MRI MPRESSION:  1. Grade I anterior slip of L3 on L4 with mild to moderate spinal stenosis.  2. Severe spinal stenosis at L4-5 with grade I anterior slip and advanced facet arthropathy.  Prednisone 10 mg: take 4 tabs a day x 3 days; then 3 tabs a day x 4 days; then 2 tabs a day x 4 days, then 1 tab a day x 6 days, then stop. Take pc.  Oxycodone w/Pnenergan prn   Potential benefits of an opioid and steroid  use as well as potential risks  and complications were explained to the patient and were aknowledged.    RTC 2 wks

## 2014-09-24 NOTE — Patient Instructions (Signed)
Sciatica Sciatica is pain, weakness, numbness, or tingling along the path of the sciatic nerve. The nerve starts in the lower back and runs down the back of each leg. The nerve controls the muscles in the lower leg and in the back of the knee, while also providing sensation to the back of the thigh, lower leg, and the sole of your foot. Sciatica is a symptom of another medical condition. For instance, nerve damage or certain conditions, such as a herniated disk or bone spur on the spine, pinch or put pressure on the sciatic nerve. This causes the pain, weakness, or other sensations normally associated with sciatica. Generally, sciatica only affects one side of the body. CAUSES   Herniated or slipped disc.  Degenerative disk disease.  A pain disorder involving the narrow muscle in the buttocks (piriformis syndrome).  Pelvic injury or fracture.  Pregnancy.  Tumor (rare). SYMPTOMS  Symptoms can vary from mild to very severe. The symptoms usually travel from the low back to the buttocks and down the back of the leg. Symptoms can include:  Mild tingling or dull aches in the lower back, leg, or hip.  Numbness in the back of the calf or sole of the foot.  Burning sensations in the lower back, leg, or hip.  Sharp pains in the lower back, leg, or hip.  Leg weakness.  Severe back pain inhibiting movement. These symptoms may get worse with coughing, sneezing, laughing, or prolonged sitting or standing. Also, being overweight may worsen symptoms. DIAGNOSIS  Your caregiver will perform a physical exam to look for common symptoms of sciatica. He or she may ask you to do certain movements or activities that would trigger sciatic nerve pain. Other tests may be performed to find the cause of the sciatica. These may include:  Blood tests.  X-rays.  Imaging tests, such as an MRI or CT scan. TREATMENT  Treatment is directed at the cause of the sciatic pain. Sometimes, treatment is not necessary  and the pain and discomfort goes away on its own. If treatment is needed, your caregiver may suggest:  Over-the-counter medicines to relieve pain.  Prescription medicines, such as anti-inflammatory medicine, muscle relaxants, or narcotics.  Applying heat or ice to the painful area.  Steroid injections to lessen pain, irritation, and inflammation around the nerve.  Reducing activity during periods of pain.  Exercising and stretching to strengthen your abdomen and improve flexibility of your spine. Your caregiver may suggest losing weight if the extra weight makes the back pain worse.  Physical therapy.  Surgery to eliminate what is pressing or pinching the nerve, such as a bone spur or part of a herniated disk. HOME CARE INSTRUCTIONS   Only take over-the-counter or prescription medicines for pain or discomfort as directed by your caregiver.  Apply ice to the affected area for 20 minutes, 3-4 times a day for the first 48-72 hours. Then try heat in the same way.  Exercise, stretch, or perform your usual activities if these do not aggravate your pain.  Attend physical therapy sessions as directed by your caregiver.  Keep all follow-up appointments as directed by your caregiver.  Do not wear high heels or shoes that do not provide proper support.  Check your mattress to see if it is too soft. A firm mattress may lessen your pain and discomfort. SEEK IMMEDIATE MEDICAL CARE IF:   You lose control of your bowel or bladder (incontinence).  You have increasing weakness in the lower back, pelvis, buttocks,   or legs.  You have redness or swelling of your back.  You have a burning sensation when you urinate.  You have pain that gets worse when you lie down or awakens you at night.  Your pain is worse than you have experienced in the past.  Your pain is lasting longer than 4 weeks.  You are suddenly losing weight without reason. MAKE SURE YOU:  Understand these  instructions.  Will watch your condition.  Will get help right away if you are not doing well or get worse. Document Released: 06/09/2001 Document Revised: 12/15/2011 Document Reviewed: 10/25/2011 Exit[employer] Patient Information 2015 [employer]. This information is not intended to replace advice given to you by your health care provider. Make sure you discuss any questions you have with your health care provider.  

## 2014-09-24 NOTE — Progress Notes (Signed)
Subjective:   Back Pain This is a new problem. The current episode started 1 to 4 weeks ago. The problem occurs intermittently. The pain is present in the gluteal and lumbar spine. The quality of the pain is described as aching. The pain radiates to the left foot, left thigh and left knee. The pain is severe. The pain is worse during the day. The symptoms are aggravated by bending, sitting and standing. Pertinent negatives include no abdominal pain, chest pain, dysuria, fever, headaches, numbness, pelvic pain, weakness or weight loss. Risk factors include history of osteoporosis and history of steroid use. She has tried NSAIDs for the symptoms. The treatment provided mild relief.    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  C/o severe L LS spine pain and LLE pain - severe x 3 wks;the pt had to stop her stair-master last week  F/u pain in joints R>L wrists, hands; LS spine R ankle -- stiffness x 1 h; all flared up since August 2015 The patient is here to follow up on chronic thrombocythosis, headaches and chronic moderate LBP and B wrist arthritis symptoms not controlled with medicines - worse. F/u R knee, R hip, thigh pain - resolved.   Review of Systems  Constitutional: Negative.  Negative for fever, chills, weight loss, diaphoresis, activity change, appetite change, fatigue and unexpected weight change.  HENT: Negative for congestion, ear pain, facial swelling, hearing loss, mouth sores, nosebleeds, postnasal drip, rhinorrhea, sinus pressure, sneezing, sore throat, tinnitus and trouble swallowing.   Eyes: Negative for pain, discharge, redness, itching and visual disturbance.  Respiratory: Negative for cough, chest tightness, shortness of breath, wheezing and stridor.   Cardiovascular: Negative for chest pain, palpitations and leg swelling.  Gastrointestinal: Negative for nausea, abdominal pain, diarrhea, constipation, blood in stool, abdominal distention, anal bleeding and  rectal pain.  Genitourinary: Negative for dysuria, urgency, frequency, hematuria, flank pain, vaginal bleeding, vaginal discharge, difficulty urinating, genital sores and pelvic pain.  Musculoskeletal: Positive for back pain, joint swelling, arthralgias, neck pain and neck stiffness. Negative for gait problem.  Skin: Negative.  Negative for rash.  Neurological: Negative for dizziness, tremors, seizures, syncope, speech difficulty, weakness, numbness and headaches.  Hematological: Negative for adenopathy. Does not bruise/bleed easily.  Psychiatric/Behavioral: Negative for suicidal ideas, behavioral problems, sleep disturbance, dysphoric mood and decreased concentration. The patient is not nervous/anxious.    Wt Readings from Last 3 Encounters:  09/24/14 163 lb (73.936 kg)  06/12/14 162 lb 14.4 oz (73.891 kg)  04/23/14 161 lb (73.029 kg)   BP Readings from Last 3 Encounters:  09/24/14 140/80  06/12/14 145/62  04/23/14 140/78        Objective:   Physical Exam  Constitutional: She appears well-developed and well-nourished. No distress.  HENT:  Head: Normocephalic.  Right Ear: External ear normal.  Left Ear: External ear normal.  Nose: Nose normal.  Mouth/Throat: Oropharynx is clear and moist.  Eyes: Conjunctivae are normal. Pupils are equal, round, and reactive to light. Right eye exhibits no discharge. Left eye exhibits no discharge.  Neck: Normal range of motion. Neck supple. No JVD present. No tracheal deviation present. No thyromegaly present.  Cardiovascular: Normal rate, regular rhythm and normal heart sounds.   Pulmonary/Chest: No stridor. No respiratory distress. She has no wheezes.  Abdominal: Soft. Bowel sounds are normal. She exhibits no distension and no mass. There is no tenderness. There is no rebound and no guarding.  Musculoskeletal: She exhibits tenderness. She exhibits no edema.  B radial  wrists with tender firm swelling; B MCPs - tender, swollen No cane LS, B hips  are tender  Lymphadenopathy:    She has no cervical adenopathy.  Neurological: She displays normal reflexes. No cranial nerve deficit. She exhibits normal muscle tone. Coordination normal.  Skin: No rash noted. No erythema. No pallor.  Psychiatric: She has a normal mood and affect. Her behavior is normal. Judgment and thought content normal.  L buttock and L hip are tender Strait leg is (+) on L; L knee reflex is decreased a little  Lab Results  Component Value Date   WBC 4.3 09/11/2014   HGB 11.6 09/11/2014   HCT 33.2* 09/11/2014   PLT 413* 09/11/2014   GLUCOSE 168* 09/11/2014   CHOL 145 04/22/2012   TRIG 105.0 04/22/2012   HDL 36.50* 04/22/2012   LDLCALC 88 04/22/2012   ALT 8 09/11/2014   AST 17 09/11/2014   NA 141 09/11/2014   K 3.6 09/11/2014   CL 101 04/23/2014   CREATININE 0.9 09/11/2014   BUN 8.7 09/11/2014   CO2 26 09/11/2014   TSH 0.59 04/22/2012   HGBA1C 6.2 12/16/2009   10/31/105 LS MRI MPRESSION:  1. Grade I anterior slip of L3 on L4 with mild to moderate spinal stenosis.  2. Severe spinal stenosis at L4-5 with grade I anterior slip and advanced facet arthropathy.     Assessment & Plan:   Patient ID: Paula Griffith, female   DOB: 23-Sep-1945, 69 y.o.   MRN: [id number]

## 2014-09-24 NOTE — Progress Notes (Signed)
Pre visit review using our clinic review tool, if applicable. No additional management support is needed unless otherwise documented below in the visit note. 

## 2014-10-09 ENCOUNTER — Ambulatory Visit (INDEPENDENT_AMBULATORY_CARE_PROVIDER_SITE_OTHER): Payer: Medicare Other | Admitting: Internal Medicine

## 2014-10-09 ENCOUNTER — Encounter: Payer: Self-pay | Admitting: Internal Medicine

## 2014-10-09 VITALS — BP 158/72 | HR 92 | Wt 159.0 lb

## 2014-10-09 DIAGNOSIS — D471 Chronic myeloproliferative disease: Secondary | ICD-10-CM | POA: Diagnosis not present

## 2014-10-09 DIAGNOSIS — I10 Essential (primary) hypertension: Secondary | ICD-10-CM

## 2014-10-09 DIAGNOSIS — M5442 Lumbago with sciatica, left side: Secondary | ICD-10-CM | POA: Diagnosis not present

## 2014-10-09 MED ORDER — GABAPENTIN 100 MG PO CAPS: 100.0000 mg | ORAL_CAPSULE | Freq: Three times a day (TID) | ORAL | Status: DC

## 2014-10-09 NOTE — Assessment & Plan Note (Signed)
BP Readings from Last 3 Encounters:  10/09/14 158/72  09/24/14 140/80  06/12/14 145/62   Losartan HCT, Amlodipine Treat pain

## 2014-10-09 NOTE — Progress Notes (Signed)
Pre visit review using our clinic review tool, if applicable. No additional management support is needed unless otherwise documented below in the visit note. 

## 2014-10-09 NOTE — Progress Notes (Signed)
Subjective:   Back Pain This is a chronic problem. The current episode started more than 1 month ago. The problem occurs intermittently. The pain is present in the gluteal and lumbar spine. The quality of the pain is described as aching. The pain radiates to the left foot, left thigh and left knee. The pain is at a severity of 10/10. The pain is severe. The pain is worse during the day. The symptoms are aggravated by bending, sitting and standing. Pertinent negatives include no abdominal pain, chest pain, dysuria, fever, headaches, numbness, pelvic pain, weakness or weight loss. Risk factors include history of osteoporosis and history of steroid use. She has tried NSAIDs for the symptoms. The treatment provided mild relief.  Predn did not help. Oxy makes me sleep...  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  C/o severe L LS spine pain and LLE pain - severe x wks --- 10/10 in intensity  F/u pain in joints R>L wrists, hands; LS spine R ankle -- stiffness x 1 h; all flared up since August 2015 The patient is here to follow up on chronic thrombocythosis, headaches and chronic moderate LBP and B wrist arthritis symptoms not controlled with medicines - worse. F/u R knee, R hip, thigh pain - resolved.   Review of Systems  Constitutional: Negative.  Negative for fever, chills, weight loss, diaphoresis, activity change, appetite change, fatigue and unexpected weight change.  HENT: Negative for congestion, ear pain, facial swelling, hearing loss, mouth sores, nosebleeds, postnasal drip, rhinorrhea, sinus pressure, sneezing, sore throat, tinnitus and trouble swallowing.   Eyes: Negative for pain, discharge, redness, itching and visual disturbance.  Respiratory: Negative for cough, chest tightness, shortness of breath, wheezing and stridor.   Cardiovascular: Negative for chest pain, palpitations and leg swelling.  Gastrointestinal: Negative for nausea, abdominal pain, diarrhea, constipation,  blood in stool, abdominal distention, anal bleeding and rectal pain.  Genitourinary: Negative for dysuria, urgency, frequency, hematuria, flank pain, vaginal bleeding, vaginal discharge, difficulty urinating, genital sores and pelvic pain.  Musculoskeletal: Positive for back pain, joint swelling, arthralgias, neck pain and neck stiffness. Negative for gait problem.  Skin: Negative.  Negative for rash.  Neurological: Negative for dizziness, tremors, seizures, syncope, speech difficulty, weakness, numbness and headaches.  Hematological: Negative for adenopathy. Does not bruise/bleed easily.  Psychiatric/Behavioral: Negative for suicidal ideas, behavioral problems, sleep disturbance, dysphoric mood and decreased concentration. The patient is not nervous/anxious.    Wt Readings from Last 3 Encounters:  10/09/14 159 lb (72.122 kg)  09/24/14 163 lb (73.936 kg)  06/12/14 162 lb 14.4 oz (73.891 kg)   BP Readings from Last 3 Encounters:  10/09/14 158/72  09/24/14 140/80  06/12/14 145/62        Objective:   Physical Exam  Constitutional: She appears well-developed and well-nourished. No distress.  HENT:  Head: Normocephalic.  Right Ear: External ear normal.  Left Ear: External ear normal.  Nose: Nose normal.  Mouth/Throat: Oropharynx is clear and moist.  Eyes: Conjunctivae are normal. Pupils are equal, round, and reactive to light. Right eye exhibits no discharge. Left eye exhibits no discharge.  Neck: Normal range of motion. Neck supple. No JVD present. No tracheal deviation present. No thyromegaly present.  Cardiovascular: Normal rate, regular rhythm and normal heart sounds.   Pulmonary/Chest: No stridor. No respiratory distress. She has no wheezes.  Abdominal: Soft. Bowel sounds are normal. She exhibits no distension and no mass. There is no tenderness. There is no rebound and no guarding.  Musculoskeletal:  She exhibits tenderness. She exhibits no edema.  B radial wrists with tender firm  swelling; B MCPs - tender, swollen No cane LS, B hips are tender  Lymphadenopathy:    She has no cervical adenopathy.  Neurological: She displays normal reflexes. No cranial nerve deficit. She exhibits normal muscle tone. Coordination normal.  Skin: No rash noted. No erythema. No pallor.  Psychiatric: She has a normal mood and affect. Her behavior is normal. Judgment and thought content normal.  L buttock and L hip are tender Strait leg is (+) on L; L knee reflex is decreased a little  Lab Results  Component Value Date   WBC 4.3 09/11/2014   HGB 11.6 09/11/2014   HCT 33.2* 09/11/2014   PLT 413* 09/11/2014   GLUCOSE 168* 09/11/2014   CHOL 145 04/22/2012   TRIG 105.0 04/22/2012   HDL 36.50* 04/22/2012   LDLCALC 88 04/22/2012   ALT 8 09/11/2014   AST 17 09/11/2014   NA 141 09/11/2014   K 3.6 09/11/2014   CL 101 04/23/2014   CREATININE 0.9 09/11/2014   BUN 8.7 09/11/2014   CO2 26 09/11/2014   TSH 0.59 04/22/2012   HGBA1C 6.2 12/16/2009   04/28/14 LS MRI MPRESSION:  1. Grade I anterior slip of L3 on L4 with mild to moderate spinal stenosis.  2. Severe spinal stenosis at L4-5 with grade I anterior slip and advanced facet arthropathy.     Assessment & Plan:

## 2014-10-09 NOTE — Assessment & Plan Note (Signed)
Stable

## 2014-10-09 NOTE — Assessment & Plan Note (Signed)
Chronic - 4/16 worse L side - radiculopathy. Will ref to Dr Vira Blanco for ?injections vs other. Finishing Prednisone. Added Gabapentin - low dose...  10/31/105 LS MRI MPRESSION:  1. Grade I anterior slip of L3 on L4 with mild to moderate spinal stenosis.  2. Severe spinal stenosis at L4-5 with grade I anterior slip and advanced facet arthropathy.  Prednisone 10 mg: take 4 tabs a day x 3 days; then 3 tabs a day x 4 days; then 2 tabs a day x 4 days, then 1 tab a day x 6 days, then stop. Take pc - finishing  Oxycodone 1/2 or 1/4. Gabapentin if tol.   Potential benefits of an opioid and steroid  use as well as potential risks  and complications were explained to the patient and were aknowledged.

## 2014-10-29 ENCOUNTER — Other Ambulatory Visit: Payer: Self-pay | Admitting: Pain Medicine

## 2014-10-29 DIAGNOSIS — M545 Low back pain: Secondary | ICD-10-CM

## 2014-11-06 ENCOUNTER — Other Ambulatory Visit: Payer: Medicare Other

## 2014-11-07 ENCOUNTER — Ambulatory Visit
Admission: RE | Admit: 2014-11-07 | Discharge: 2014-11-07 | Disposition: A | Discharge status: Home / Self Care | Payer: BC Managed Care – PPO | Source: Ambulatory Visit | Attending: Pain Medicine | Admitting: Pain Medicine

## 2014-11-07 DIAGNOSIS — M545 Low back pain: Secondary | ICD-10-CM

## 2014-11-20 ENCOUNTER — Ambulatory Visit: Payer: Medicare Other | Admitting: Internal Medicine

## 2014-11-29 ENCOUNTER — Other Ambulatory Visit: Payer: Self-pay

## 2014-11-29 DIAGNOSIS — Z1231 Encounter for screening mammogram for malignant neoplasm of breast: Secondary | ICD-10-CM

## 2014-12-11 ENCOUNTER — Other Ambulatory Visit: Payer: Medicare Other

## 2014-12-11 ENCOUNTER — Other Ambulatory Visit: Payer: Self-pay | Admitting: *Deleted

## 2014-12-11 ENCOUNTER — Encounter: Payer: Medicare Other | Admitting: Hematology

## 2014-12-11 NOTE — Progress Notes (Signed)
No show  This encounter was created in error - please disregard.

## 2014-12-12 ENCOUNTER — Other Ambulatory Visit: Payer: Self-pay | Admitting: *Deleted

## 2014-12-12 ENCOUNTER — Telehealth: Payer: Self-pay | Admitting: *Deleted

## 2014-12-12 ENCOUNTER — Telehealth: Payer: Self-pay | Admitting: Hematology

## 2014-12-12 NOTE — Telephone Encounter (Signed)
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

## 2014-12-12 NOTE — Telephone Encounter (Signed)
per pof to sch per Thu-per pof pt aware

## 2014-12-12 NOTE — Telephone Encounter (Signed)
Spoke with pt today, and gave pt date and time for rescheduled appts with Dr. Burr Medico on 12/24/14.  Pt aware and voiced understanding.  New POF sent to scheduler.

## 2014-12-24 ENCOUNTER — Encounter: Payer: Self-pay | Admitting: Hematology

## 2014-12-24 ENCOUNTER — Telehealth: Payer: Self-pay | Admitting: Hematology

## 2014-12-24 ENCOUNTER — Ambulatory Visit (HOSPITAL_BASED_OUTPATIENT_CLINIC_OR_DEPARTMENT_OTHER): Payer: Medicare Other | Admitting: Hematology

## 2014-12-24 ENCOUNTER — Other Ambulatory Visit: Payer: BC Managed Care – PPO

## 2014-12-24 ENCOUNTER — Ambulatory Visit: Payer: BC Managed Care – PPO | Admitting: Hematology

## 2014-12-24 ENCOUNTER — Other Ambulatory Visit (HOSPITAL_BASED_OUTPATIENT_CLINIC_OR_DEPARTMENT_OTHER): Payer: Medicare Other

## 2014-12-24 VITALS — BP 151/75 | HR 99 | Temp 98.9°F | Resp 18 | Ht 64.0 in | Wt 162.6 lb

## 2014-12-24 DIAGNOSIS — D473 Essential (hemorrhagic) thrombocythemia: Secondary | ICD-10-CM

## 2014-12-24 DIAGNOSIS — D471 Chronic myeloproliferative disease: Secondary | ICD-10-CM

## 2014-12-24 LAB — CBC WITH DIFFERENTIAL/PLATELET
BASO%: 0.5 % (ref 0.0–2.0)
Basophils Absolute: 0 10*3/uL (ref 0.0–0.1)
EOS%: 0.9 % (ref 0.0–7.0)
Eosinophils Absolute: 0.1 10*3/uL (ref 0.0–0.5)
HEMATOCRIT: 30.1 % — AB (ref 34.8–46.6)
HGB: 10.3 g/dL — ABNORMAL LOW (ref 11.6–15.9)
LYMPH%: 26.9 % (ref 14.0–49.7)
MCH: 36.5 pg — AB (ref 25.1–34.0)
MCHC: 34.2 g/dL (ref 31.5–36.0)
MCV: 106.7 fL — AB (ref 79.5–101.0)
MONO#: 0.4 10*3/uL (ref 0.1–0.9)
MONO%: 7.5 % (ref 0.0–14.0)
NEUT#: 3.8 10*3/uL (ref 1.5–6.5)
NEUT%: 64.2 % (ref 38.4–76.8)
PLATELETS: 386 10*3/uL (ref 145–400)
RBC: 2.82 10*6/uL — AB (ref 3.70–5.45)
RDW: 12.4 % (ref 11.2–14.5)
WBC: 5.8 10*3/uL (ref 3.9–10.3)
lymph#: 1.6 10*3/uL (ref 0.9–3.3)

## 2014-12-24 LAB — COMPREHENSIVE METABOLIC PANEL (CC13)
ALBUMIN: 3.6 g/dL (ref 3.5–5.0)
ALK PHOS: 57 U/L (ref 40–150)
ALT: 9 U/L (ref 0–55)
AST: 15 U/L (ref 5–34)
Anion Gap: 8 mEq/L (ref 3–11)
BUN: 20.6 mg/dL (ref 7.0–26.0)
CO2: 29 mEq/L (ref 22–29)
Calcium: 9.4 mg/dL (ref 8.4–10.4)
Chloride: 100 mEq/L (ref 98–109)
Creatinine: 1 mg/dL (ref 0.6–1.1)
EGFR: 70 mL/min/{1.73_m2} — ABNORMAL LOW (ref >90)
Glucose: 158 mg/dl — ABNORMAL HIGH (ref 70–140)
Potassium: 4.1 mEq/L (ref 3.5–5.1)
Sodium: 137 mEq/L (ref 136–145)
Total Bilirubin: 0.4 mg/dL (ref 0.20–1.20)
Total Protein: 6.6 g/dL (ref 6.4–8.3)

## 2014-12-24 MED ORDER — HYDROXYUREA 500 MG PO CAPS: ORAL_CAPSULE | ORAL | Status: DC

## 2014-12-24 NOTE — Progress Notes (Signed)
[place] OFFICE PROGRESS NOTE  [name], MD [place] [address]  DIAGNOSIS: Essential thrombocythemia - Plan: hydroxyurea (HYDREA) 500 MG capsule, CBC with Differential, Comprehensive metabolic panel (Cmet) - [facility]  MPN (myeloproliferative neoplasm)  PROBLEM LIST: 1. Essential thrombocythemia with thrombocytosis going back to 2000. Mutations for JAK2 V617F, Exon 12, and MPL515 were negative. The patient had a bone marrow on 11/14/2009 which showed slightly hypercellular marrow for the patient's age, along with abundant iron stores. There was minimal reticulin fibrosis. Trichrome stains failed to show any definite collagenous fibrosis. The patient is maintained on hydroxyurea and aspirin since mid June 2011.  2. Right retinal artery occlusion and subsequent retinal infarct on 05/22/2009. The patient's vision has returned to near normal.  3. Peripheral vascular disease involving the right carotid artery detected 08/05/2009.  4. Hypertension  5. Allergic rhinitis  6. History of asthma  7. Adenomatous colonic polyps.  8. Diverticulosis  Chief complaint: Follow-up  CURRENT THERAPY: Hydrea 500 mg  Every day except 1,000 mg on M and Thurs.  Aspirin 325 mg daily.   INTERVAL HISTORY: Paula Griffith 69 y.o. female with presumed essential thrombocythemia currently maintained on aspirin and hydroxyurea is here for follow-up.  She was last seen by me 6 month ago.   She developed severe back pain about 2 months ago, and was seen by pain management clinic, received steroids after injection and pain medication. Her pain is much improved, she is able to function well, she works in our summer camp now. She denies any other new medical event since I saw her 6 months ago. She feels well overall.   MEDICAL HISTORY: Past Medical History  Diagnosis Date  . Colon polyps   . Diverticulosis of colon   . HTN (hypertension)   . LBP (low back pain)   . Osteoarthritis   .  Asthma   . Allergic rhinitis   . Glucose intolerance (impaired glucose tolerance)   . PVD (peripheral vascular disease) 2011    Right Carotid Dr Scot Dock  . Retinal infarct 9381    embolic  . Thrombocytopathy 2011    Dr Ralene Ok      ALLERGIES:  is allergic to benazepril; codeine; and tramadol.  MEDICATIONS: has a current medication list which includes the following prescription(s): albuterol, amlodipine, aspirin, budesonide-formoterol, cholecalciferol, gabapentin, hydroxyurea, ibuprofen, loratadine, losartan-hydrochlorothiazide, multivitamin, potassium chloride, cyclobenzaprine, meloxicam, prednisone, and promethazine.  SURGICAL HISTORY:  Past Surgical History  Procedure Laterality Date  . Abdominal hysterectomy  1989    REVIEW OF SYSTEMS:   Constitutional: Denies fevers, chills or abnormal weight loss Eyes: Denies blurriness of vision Ears, nose, mouth, throat, and face: Denies mucositis or sore throat Respiratory: Denies cough, dyspnea or wheezes Cardiovascular: Denies palpitation, chest discomfort or lower extremity swelling Gastrointestinal:  Denies nausea, heartburn or change in bowel habits Skin: Denies abnormal skin rashes Lymphatics: Denies new lymphadenopathy or easy bruising Neurological:Denies numbness, tingling or new weaknesses Behavioral/Psych: Mood is stable, no new changes  All other systems were reviewed with the patient and are negative.  PHYSICAL EXAMINATION: ECOG PERFORMANCE STATUS: 0 - Asymptomatic  Blood pressure 151/75, pulse 99, temperature 98.9 F (37.2 C), temperature source Oral, resp. rate 18, height 5\' 4"  (1.626 m), weight 162 lb 9.6 oz (73.755 kg), SpO2 100 %.  GENERAL:alert, no distress and comfortable; well developed and well nourished. SKIN: skin color, texture, turgor are normal, no rashes or significant lesions EYES: normal, Conjunctiva are pink and non-injected, sclera clear OROPHARYNX:no  exudate, no erythema and lips, buccal mucosa, and  tongue normal  NECK: supple, thyroid normal size, non-tender, without nodularity LYMPH:  no palpable lymphadenopathy in the cervical, axillary or supraclavicular LUNGS: clear to auscultation and percussion with normal breathing effort HEART: regular rate & rhythm and no murmurs and no lower extremity edema ABDOMEN:abdomen soft, non-tender and normal bowel sounds Musculoskeletal:no cyanosis of digits and no clubbing ; R ankle larger than Left with slight tenderness to palpation.  NEURO: alert & oriented x 3 with fluent speech, no focal motor/sensory deficits   LABORATORY DATA: CBC Latest Ref Rng 12/24/2014 09/11/2014 06/12/2014  WBC 3.9 - 10.3 10e3/uL 5.8 4.3 9.5  Hemoglobin 11.6 - 15.9 g/dL 10.3(L) 11.6 11.4(L)  Hematocrit 34.8 - 46.6 % 30.1(L) 33.2(L) 34.4(L)  Platelets 145 - 400 10e3/uL 386 413(H) 435(H)    CMP Latest Ref Rng 12/24/2014 09/11/2014 06/12/2014  Glucose 70 - 140 mg/dl 158(H) 168(H) 198(H)  BUN 7.0 - 26.0 mg/dL 20.6 8.7 13.1  Creatinine 0.6 - 1.1 mg/dL 1.0 0.9 1.0  Sodium 136 - 145 mEq/L 137 141 140  Potassium 3.5 - 5.1 mEq/L 4.1 3.6 3.9  Chloride 96 - 112 mEq/L - - -  CO2 22 - 29 mEq/L 29 26 25   Calcium 8.4 - 10.4 mg/dL 9.4 9.2 8.9  Total Protein 6.4 - 8.3 g/dL 6.6 7.1 6.6  Total Bilirubin 0.20 - 1.20 mg/dL 0.40 0.49 0.41  Alkaline Phos 40 - 150 U/L 57 48 65  AST 5 - 34 U/L 15 17 16   ALT 0 - 55 U/L 9 8 14       RADIOGRAPHIC STUDIES: No results found.  ASSESSMENT: Paula Griffith 69 y.o. female with a history of Essential thrombocythemia    1. 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  --[name] seems to be doing  well at the present time with good control of her platelet count. She has had no thrombotic or bleeding problems. -Her pre-count is 386K today.  - She will stay on the same dose of Hydroxyurea, which is 500 mg twice daily on Mondays and Thursdays and 500 mg daily the other 5 days of the week. (She was provided a  prescription today). She takes 9 capsules of Hydroxyurea per week. She is also on Aspirin 325 mg daily.  -I refilled Hydrea today  2. Arthritis and back pain  -She will continue follow-up with her  PCP and pain management clinic.  3. Hypertension and other medical problems She'll continue follow-up with her primary care physician  Plan 1. Repeat lab in 3 month and 6 month 2. Return in 6 month for follow up  All questions were answered. The patient knows to call the clinic with any problems, questions or concerns. We can certainly see the patient much sooner if necessary.  I spent 15 minutes counseling the patient face to face. The total time spent in the appointment was 20 minutes.     Truitt Merle, MD 12/24/2014 10:53 AM

## 2014-12-24 NOTE — Telephone Encounter (Signed)
Gave and printed appt sched and avs for pt for SEpt and DEC

## 2014-12-25 ENCOUNTER — Ambulatory Visit
Admission: RE | Admit: 2014-12-25 | Discharge: 2014-12-25 | Disposition: A | Discharge status: Home / Self Care | Payer: BC Managed Care – PPO | Source: Ambulatory Visit

## 2014-12-25 DIAGNOSIS — Z1231 Encounter for screening mammogram for malignant neoplasm of breast: Secondary | ICD-10-CM

## 2014-12-26 ENCOUNTER — Other Ambulatory Visit: Payer: Self-pay | Admitting: Internal Medicine

## 2015-03-18 ENCOUNTER — Other Ambulatory Visit: Payer: BC Managed Care – PPO

## 2015-03-21 ENCOUNTER — Encounter: Payer: Self-pay | Admitting: Internal Medicine

## 2015-03-21 ENCOUNTER — Ambulatory Visit (INDEPENDENT_AMBULATORY_CARE_PROVIDER_SITE_OTHER): Payer: Medicare Other | Admitting: Internal Medicine

## 2015-03-21 ENCOUNTER — Other Ambulatory Visit (INDEPENDENT_AMBULATORY_CARE_PROVIDER_SITE_OTHER): Payer: Medicare Other

## 2015-03-21 VITALS — BP 134/68 | HR 100 | Temp 98.8°F | Resp 16 | Wt 156.0 lb

## 2015-03-21 DIAGNOSIS — R058 Other specified cough: Secondary | ICD-10-CM

## 2015-03-21 DIAGNOSIS — R112 Nausea with vomiting, unspecified: Secondary | ICD-10-CM | POA: Diagnosis not present

## 2015-03-21 DIAGNOSIS — R05 Cough: Secondary | ICD-10-CM

## 2015-03-21 DIAGNOSIS — T63481A Toxic effect of venom of other arthropod, accidental (unintentional), initial encounter: Secondary | ICD-10-CM | POA: Diagnosis not present

## 2015-03-21 DIAGNOSIS — IMO0001 Reserved for inherently not codable concepts without codable children: Secondary | ICD-10-CM

## 2015-03-21 DIAGNOSIS — R509 Fever, unspecified: Secondary | ICD-10-CM | POA: Diagnosis not present

## 2015-03-21 DIAGNOSIS — Z23 Encounter for immunization: Secondary | ICD-10-CM | POA: Diagnosis not present

## 2015-03-21 LAB — URINALYSIS
Bilirubin Urine: NEGATIVE
Hgb urine dipstick: NEGATIVE
KETONES UR: 15 — AB
LEUKOCYTES UA: NEGATIVE
Nitrite: NEGATIVE
PH: 6 (ref 5.0–8.0)
SPECIFIC GRAVITY, URINE: 1.025 (ref 1.000–1.030)
UROBILINOGEN UA: 0.2 (ref 0.0–1.0)
Urine Glucose: NEGATIVE

## 2015-03-21 LAB — CBC WITH DIFFERENTIAL/PLATELET
BASOS ABS: 0 10*3/uL (ref 0.0–0.1)
Basophils Relative: 0.2 % (ref 0.0–3.0)
EOS ABS: 0 10*3/uL (ref 0.0–0.7)
Eosinophils Relative: 0.4 % (ref 0.0–5.0)
HEMATOCRIT: 31.2 % — AB (ref 36.0–46.0)
HEMOGLOBIN: 10.4 g/dL — AB (ref 12.0–15.0)
LYMPHS PCT: 21.9 % (ref 12.0–46.0)
Lymphs Abs: 1.8 10*3/uL (ref 0.7–4.0)
MCHC: 33.4 g/dL (ref 30.0–36.0)
MCV: 113.4 fl — ABNORMAL HIGH (ref 78.0–100.0)
MONOS PCT: 9.7 % (ref 3.0–12.0)
Monocytes Absolute: 0.8 10*3/uL (ref 0.1–1.0)
Neutro Abs: 5.5 10*3/uL (ref 1.4–7.7)
Neutrophils Relative %: 67.8 % (ref 43.0–77.0)
PLATELETS: 504 10*3/uL — AB (ref 150.0–400.0)
RBC: 2.75 Mil/uL — ABNORMAL LOW (ref 3.87–5.11)
RDW: 13.8 % (ref 11.5–15.5)
WBC: 8.1 10*3/uL (ref 4.0–10.5)

## 2015-03-21 LAB — HEPATIC FUNCTION PANEL
ALK PHOS: 49 U/L (ref 39–117)
ALT: 9 U/L (ref 0–35)
AST: 20 U/L (ref 0–37)
Albumin: 4 g/dL (ref 3.5–5.2)
BILIRUBIN DIRECT: 0 mg/dL (ref 0.0–0.3)
BILIRUBIN TOTAL: 0.3 mg/dL (ref 0.2–1.2)
TOTAL PROTEIN: 7 g/dL (ref 6.0–8.3)

## 2015-03-21 LAB — BASIC METABOLIC PANEL
BUN: 14 mg/dL (ref 6–23)
CALCIUM: 9.8 mg/dL (ref 8.4–10.5)
CO2: 33 mEq/L — ABNORMAL HIGH (ref 19–32)
CREATININE: 1.06 mg/dL (ref 0.40–1.20)
Chloride: 102 mEq/L (ref 96–112)
GFR: 66.02 mL/min (ref >60.00)
Glucose, Bld: 135 mg/dL — ABNORMAL HIGH (ref 70–99)
Potassium: 3.5 mEq/L (ref 3.5–5.1)
SODIUM: 144 meq/L (ref 135–145)

## 2015-03-21 NOTE — Progress Notes (Signed)
Pre visit review using our clinic review tool, if applicable. No additional management support is needed unless otherwise documented below in the visit note. 

## 2015-03-21 NOTE — Patient Instructions (Signed)
  Your next office appointment will be determined based upon review of your pending labs. Those written interpretation of the lab results and instructions will be transmitted to you by My Chart  Critical results will be called.   Followup as needed for any active or acute issue. Please report any significant change in your symptoms. 

## 2015-03-21 NOTE — Progress Notes (Signed)
   Subjective:    Patient ID: Paula Griffith, female    DOB: May 05, 1946, 69 y.o.   MRN: [id number]  HPI   3 weeks ago she was stung multiple times by wasps over the chest and extremities. The entry point at the left upper chest actually resulted in a tissue deficit which took 3 weeks to resolve. In the 48 hours after sustaining the stings she had nausea and vomiting and diarrhea. After the multiple stings she would have spells of feeling hot intermittently and had "nausea type headaches". She had diffuse soreness. She also had thick phlegm the morning after brushing her teeth. That resolved as of today. She has rheumatoid arthritis and is followed at the pain clinic for chronic pain.   Review of Systems  Frontal headache, facial pain , nasal purulence, dental pain, sore throat , otic pain or otic discharge denied at this time.  Cough, hemoptysis or pleuritic pain denied.  No diarrhea present now.Cola colored urine or clay colored stools denied.  Dysuria, pyuria, or hematuria not present.  No vaginal discharge or bleeding noted.  No new rashes, pustules, vesicles.  No new redness or swelling of joints. She believes the joint swelling is actually improved.      Objective:   Physical Exam Pertinent or positive findings include: She has an upper partial. 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 She has fusiform enlargement of the wrists especially at the base of the thumbs. This is more pronounced on the right. She has marked crepitus of the knees. She has scattered hyperpigmented scars at the site of the wasp stings.  General appearance :adequately nourished; in no distress.  Eyes: No conjunctival inflammation or scleral icterus is present.  Oral exam:  Lips and gums are healthy appearing.There is no oropharyngeal erythema or exudate noted.  Heart:  Normal rate and regular rhythm. S1 and S2 normal without gallop, murmur, click, rub or other extra sounds    Lungs:Chest clear to  auscultation; no wheezes, rhonchi,rales ,or rubs present.No increased work of breathing.   Abdomen: bowel sounds normal, soft and non-tender without masses, organomegaly or hernias noted.  No guarding or rebound.  Vascular : all pulses equal ; no bruits present.  Skin:Warm & dry.  Intact without suspicious lesions or rashes ; no tenting    Lymphatic: No lymphadenopathy is noted about the head, neck, axilla.   Neuro: Strength, tone & DTRs normal.      Assessment & Plan:  #1 multiple wasp stings  #2 fever  #3 nausea, vomiting, diarrhea; resolved  #4 sputum production, questionably resolved  See orders

## 2015-03-22 ENCOUNTER — Other Ambulatory Visit (INDEPENDENT_AMBULATORY_CARE_PROVIDER_SITE_OTHER): Payer: Medicare Other

## 2015-03-22 DIAGNOSIS — R739 Hyperglycemia, unspecified: Secondary | ICD-10-CM | POA: Diagnosis not present

## 2015-03-22 LAB — CULTURE, URINE COMPREHENSIVE
COLONY COUNT: NO GROWTH
ORGANISM ID, BACTERIA: NO GROWTH

## 2015-03-22 LAB — HEMOGLOBIN A1C: Hgb A1c MFr Bld: 6.4 % (ref 4.6–6.5)

## 2015-03-25 ENCOUNTER — Other Ambulatory Visit: Payer: Self-pay | Admitting: Internal Medicine

## 2015-03-28 ENCOUNTER — Other Ambulatory Visit: Payer: Self-pay | Admitting: Internal Medicine

## 2015-04-01 ENCOUNTER — Encounter: Payer: Self-pay | Admitting: Family

## 2015-04-03 ENCOUNTER — Encounter (HOSPITAL_COMMUNITY): Payer: Medicare Other

## 2015-04-03 ENCOUNTER — Ambulatory Visit (INDEPENDENT_AMBULATORY_CARE_PROVIDER_SITE_OTHER): Payer: Medicare Other | Admitting: Family

## 2015-04-03 ENCOUNTER — Ambulatory Visit (HOSPITAL_COMMUNITY)
Admission: RE | Admit: 2015-04-03 | Discharge: 2015-04-03 | Disposition: A | Discharge status: Home / Self Care | Payer: Medicare Other | Source: Ambulatory Visit | Attending: Vascular Surgery | Admitting: Vascular Surgery

## 2015-04-03 ENCOUNTER — Ambulatory Visit: Payer: Medicare Other | Admitting: Family

## 2015-04-03 ENCOUNTER — Encounter: Payer: Self-pay | Admitting: Family

## 2015-04-03 VITALS — BP 128/68 | HR 87 | Temp 98.6°F | Resp 16 | Ht 65.0 in | Wt 153.0 lb

## 2015-04-03 DIAGNOSIS — I6523 Occlusion and stenosis of bilateral carotid arteries: Secondary | ICD-10-CM | POA: Insufficient documentation

## 2015-04-03 DIAGNOSIS — I10 Essential (primary) hypertension: Secondary | ICD-10-CM | POA: Insufficient documentation

## 2015-04-03 DIAGNOSIS — H3582 Retinal ischemia: Secondary | ICD-10-CM

## 2015-04-03 DIAGNOSIS — IMO0002 Reserved for concepts with insufficient information to code with codable children: Secondary | ICD-10-CM

## 2015-04-03 NOTE — Progress Notes (Signed)
Established Carotid Patient   History of Present Illness  Paula Griffith is a 69 y.o. female patient whom Dr. Scot Dock originally saw in 2011 with a moderate right carotid stenosis. She had developed a sudden onset of a visual field cut in the right eye and was found to have a retinal infarct with an embolus to the right retinal artery;  duplex suggested a moderate 40-59 to right carotid stenosis but her cerebral angiogram showed no significant carotid bifurcation disease. Therefore, we have simply been following her with duplex scans at yearly intervals. Dr. Scot Dock last saw pt on 03/29/13. At that time the patient had no significant carotid disease and remained asymptomatic. Dr. Scot Dock thought that it was therfore safe to stretch follow up out to 2 years and he ordered a follow up carotid duplex scan for 2 years.  She is not a smoker. She is on aspirin. She remains fairly active.    The patient reports New Medical or Surgical History: new low back pain, left foot numbness; pt worked with Dr. Andree Elk at [facility] pain management center and achieved a great deal of relief.   Pt Diabetic: no Pt smoker: non-smoker  Pt meds include: Statin : no ASA: yes Other anticoagulants/antiplatelets: no   Past Medical History  Diagnosis Date  . Colon polyps   . Diverticulosis of colon   . HTN (hypertension)   . LBP (low back pain)   . Osteoarthritis   . Asthma   . Allergic rhinitis   . Glucose intolerance (impaired glucose tolerance)   . PVD (peripheral vascular disease) ([place]) 2011    Right Carotid Dr Scot Dock  . 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 Thrombocytopathy [facility]) 2011    Dr Ralene Ok    Social History Social History  Substance Use Topics  . Smoking status: Never Smoker   . Smokeless tobacco: Never Used  . Alcohol Use: No    Family History Family History  Problem Relation Age of Onset  . Coronary artery disease      Female 1st degree relative Female <60  . Lung cancer    .  Pancreatic cancer Mother   . Arthritis Mother     RA  . Early death Father 64    MI  . Diabetes Sister   . Colon cancer Neg Hx   . Stomach cancer Neg Hx     Surgical History Past Surgical History  Procedure Laterality Date  . Abdominal hysterectomy  1989    Allergies  Allergen Reactions  . Benazepril     cough  . Codeine Nausea Only  . Tramadol     Dizzy, nauseated    Current Outpatient Prescriptions  Medication Sig Dispense Refill  . albuterol (PROAIR HFA) 108 (90 BASE) MCG/ACT inhaler Inhale 2 puffs into the lungs every 4 (four) hours as needed. For shortness of breath 1 Inhaler 11  . amLODipine (NORVASC) 5 MG tablet TAKE ONE TABLET BY MOUTH ONCE DAILY 30 tablet 11  . aspirin 325 MG EC tablet Take 325 mg by mouth daily after breakfast.      . Cholecalciferol 1000 UNITS tablet Take 1,000 Units by mouth daily.      . cyclobenzaprine (FLEXERIL) 10 MG tablet Take 10 mg by mouth 2 (two) times daily.    Marland Kitchen gabapentin (NEURONTIN) 100 MG capsule Take 1 capsule (100 mg total) by mouth 3 (three) times daily. 90 capsule 3  . hydroxyurea (HYDREA) 500 MG capsule Take one capsule (500 mg) by  mouth daily everyday except 2 capsules (1,000 mg) by mouth on Monday and Thursday. 60 capsule 5  . ibuprofen (ADVIL,MOTRIN) 600 MG tablet Take 1 tablet (600 mg total) by mouth 2 (two) times daily as needed. 60 tablet 4  . loratadine (CLARITIN) 10 MG tablet TAKE ONE TABLET BY MOUTH ONCE DAILY 90 tablet 0  . losartan-hydrochlorothiazide (HYZAAR) 100-25 MG tablet TAKE ONE TABLET BY MOUTH ONCE DAILY 90 tablet 0  . meloxicam (MOBIC) 7.5 MG tablet Take 7.5 mg by mouth 2 (two) times daily.    . Multiple Vitamin (MULTIVITAMIN) capsule Take 2 capsules by mouth daily.     . potassium chloride (K-DUR) 10 MEQ tablet TAKE ONE TABLET BY MOUTH ONCE DAILY 90 tablet 2  . predniSONE (DELTASONE) 10 MG tablet Take 10 mg by mouth daily.    . promethazine (PHENERGAN) 12.5 MG tablet Take 1-2 tablets (12.5-25 mg total) by  mouth every 6 (six) hours as needed for nausea or vomiting. 60 tablet 0  . SYMBICORT 80-4.5 MCG/ACT inhaler INHALE TWO PUFFS INTO LUNGS TWICE DAILY 10.2 g 1   No current facility-administered medications for this visit.    Review of Systems : See HPI for pertinent positives and negatives.  Physical Examination  Filed Vitals:   04/03/15 1045 04/03/15 1049  BP: 132/73 128/68  Pulse: 91 87  Temp:  98.6 F (37 C)  TempSrc:  Oral  Resp:  16  Height:  5\' 5"  (1.651 m)  Weight:  153 lb (69.4 kg)  SpO2:  100%   Body mass index is 25.46 kg/(m^2).  General: WDWN female in NAD GAIT: normal Eyes: PERRLA Pulmonary:  Non-labored, CTAB, no rales, no rhonchi, & no wheezing.  Cardiac: regular rhythm, + murmur.  VASCULAR EXAM Carotid Bruits Right Left   Negative Transmitted cardiac murmur    Aorta is not palpable. Radial pulses are 2+ palpable and equal.                                                                                                                            LE Pulses Right Left       POPLITEAL  not palpable   not palpable       POSTERIOR TIBIAL   palpable    palpable        DORSALIS PEDIS      ANTERIOR TIBIAL  palpable   palpable     Gastrointestinal: soft, nontender, BS WNL, no r/g,  no palpable masses.  Musculoskeletal: no muscle atrophy/wasting. M/S 5/5 throughout, extremities without ischemic changes. Pt removed her back brace for exam.  Neurologic: A&O X 3; Appropriate Affect, Speech is normal CN 2-12 intact, pain and light touch intact in extremities, Motor exam as listed above.   Non-Invasive Vascular Imaging CAROTID DUPLEX 04/03/2015   Right ICA: <40% stenosis. Left ICA: <40% stenosis. No significant change compared to prior exam on 03/29/2013.   Assessment: Paula Griffith is a 69 y.o. female  whom  Dr. Scot Dock originally saw in 2011 with a moderate right carotid stenosis. She had developed a sudden onset of a visual field cut in the right eye  and was found to have a retinal infarct with an embolus to the right retinal artery;  duplex suggested a moderate 40-59 to right carotid stenosis but her cerebral angiogram showed no significant carotid bifurcation disease. Therefore, we have simply been following her with duplex scans at yearly intervals. Dr. Scot Dock last saw pt on 03/29/13. At that time the patient had no significant carotid disease and remained asymptomatic. Dr. Scot Dock thought that it was therfore safe to stretch follow up out to 2 years and he ordered a follow up carotid duplex scan for 2 years.  She is not a smoker. She is on aspirin. She remains fairly active.  Today's carotid duplex suggests minimal bilateral ICA stenoses, no significant change in 2 years.      Plan: Follow-up in 2 years with Carotid Duplex.   I discussed in depth with the patient the nature of atherosclerosis, and emphasized the importance of maximal medical management including strict control of blood pressure, blood glucose, and lipid levels, obtaining regular exercise, and continued cessation of smoking.  The patient is aware that without maximal medical management the underlying atherosclerotic disease process will progress, limiting the benefit of any interventions. The patient was given information about stroke prevention and what symptoms should prompt the patient to seek immediate medical care. Thank you for allowing [place] to participate in this patient's care.  Clemon Chambers, RN, MSN, FNP-C [facility] Office: [phone]  Clinic Physician: Scot Dock  04/03/2015 10:50 AM

## 2015-04-03 NOTE — Patient Instructions (Signed)
Stroke Prevention Some medical conditions and behaviors are associated with an increased chance of having a stroke. You may prevent a stroke by making healthy choices and managing medical conditions. HOW CAN I REDUCE MY RISK OF HAVING A STROKE?   Stay physically active. Get at least 30 minutes of activity on most or all days.  Do not smoke. It may also be helpful to avoid exposure to secondhand smoke.  Limit alcohol use. Moderate alcohol use is considered to be:  No more than 2 drinks per day for men.  No more than 1 drink per day for nonpregnant women.  Eat healthy foods. This involves:  Eating 5 or more servings of fruits and vegetables a day.  Making dietary changes that address high blood pressure (hypertension), high cholesterol, diabetes, or obesity.  Manage your cholesterol levels.  Making food choices that are high in fiber and low in saturated fat, trans fat, and cholesterol may control cholesterol levels.  Take any prescribed medicines to control cholesterol as directed by your health care provider.  Manage your diabetes.  Controlling your carbohydrate and sugar intake is recommended to manage diabetes.  Take any prescribed medicines to control diabetes as directed by your health care provider.  Control your hypertension.  Making food choices that are low in salt (sodium), saturated fat, trans fat, and cholesterol is recommended to manage hypertension.  Ask your health care provider if you need treatment to lower your blood pressure. Take any prescribed medicines to control hypertension as directed by your health care provider.  If you are 18-39 years of age, have your blood pressure checked every 3-5 years. If you are 40 years of age or older, have your blood pressure checked every year.  Maintain a healthy weight.  Reducing calorie intake and making food choices that are low in sodium, saturated fat, trans fat, and cholesterol are recommended to manage  weight.  Stop drug abuse.  Avoid taking birth control pills.  Talk to your health care provider about the risks of taking birth control pills if you are over 35 years old, smoke, get migraines, or have ever had a blood clot.  Get evaluated for sleep disorders (sleep apnea).  Talk to your health care provider about getting a sleep evaluation if you snore a lot or have excessive sleepiness.  Take medicines only as directed by your health care provider.  For some people, aspirin or blood thinners (anticoagulants) are helpful in reducing the risk of forming abnormal blood clots that can lead to stroke. If you have the irregular heart rhythm of atrial fibrillation, you should be on a blood thinner unless there is a good reason you cannot take them.  Understand all your medicine instructions.  Make sure that other conditions (such as anemia or atherosclerosis) are addressed. SEEK IMMEDIATE MEDICAL CARE IF:   You have sudden weakness or numbness of the face, arm, or leg, especially on one side of the body.  Your face or eyelid droops to one side.  You have sudden confusion.  You have trouble speaking (aphasia) or understanding.  You have sudden trouble seeing in one or both eyes.  You have sudden trouble walking.  You have dizziness.  You have a loss of balance or coordination.  You have a sudden, severe headache with no known cause.  You have new chest pain or an irregular heartbeat. Any of these symptoms may represent a serious problem that is an emergency. Do not wait to see if the symptoms will   go away. Get medical help at once. Call your local emergency services (911 in [place] Do not drive yourself to the hospital.   This information is not intended to replace advice given to you by your health care provider. Make sure you discuss any questions you have with your health care provider.   Document Released: 07/23/2004 Document Revised: 07/06/2014 Document Reviewed:  12/16/2012 Elsevier Interactive Patient Education 2016 [employer]  

## 2015-05-01 ENCOUNTER — Other Ambulatory Visit: Payer: Self-pay | Admitting: Internal Medicine

## 2015-05-17 ENCOUNTER — Ambulatory Visit (INDEPENDENT_AMBULATORY_CARE_PROVIDER_SITE_OTHER): Payer: Medicare Other

## 2015-05-17 ENCOUNTER — Telehealth: Payer: Self-pay

## 2015-05-17 ENCOUNTER — Other Ambulatory Visit: Payer: Self-pay

## 2015-05-17 VITALS — BP 138/70 | Ht 65.0 in | Wt 155.2 lb

## 2015-05-17 DIAGNOSIS — Z13820 Encounter for screening for osteoporosis: Secondary | ICD-10-CM | POA: Diagnosis not present

## 2015-05-17 DIAGNOSIS — Z0000 Encounter for general adult medical examination without abnormal findings: Secondary | ICD-10-CM

## 2015-05-17 DIAGNOSIS — J45909 Unspecified asthma, uncomplicated: Secondary | ICD-10-CM

## 2015-05-17 MED ORDER — ALBUTEROL SULFATE HFA 108 (90 BASE) MCG/ACT IN AERS: 2.0000 | INHALATION_SPRAY | RESPIRATORY_TRACT | Status: DC | PRN

## 2015-05-17 NOTE — Patient Instructions (Addendum)
Paula Griffith , Thank you for taking time to come for your Medicare Wellness Visit. I appreciate your ongoing commitment to your health goals. Please review the following plan we discussed and let me know if I can assist you in the future.   Will have eye exam soon.  Will get Dexa scan this year / will receive a call from [facility]   Can learn more about "pre-diabetes" at [email or web address]  Will ask hematologist regarding vaccines; Tdap; Prevnar 13;   Will discuss Hep c the next time blood work is due  These are the goals we discussed: Goals    None      This is a list of the screening recommended for you and due dates:  Health Maintenance  Topic Date Due  .  Hepatitis C: One time screening is recommended by Center for Disease Control  (CDC) for  adults born from [birth year] through 1965.   09/27/1945  . Tetanus Vaccine  10/13/1964  . Shingles Vaccine  10/13/2005  . DEXA scan (bone density measurement)  10/14/2010  . Pneumonia vaccines (2 of 2 - PCV13) 12/09/2013  . Flu Shot  01/28/2016  . Mammogram  12/24/2016  . Colon Cancer Screening  07/26/2022    .  Bone Densitometry Bone densitometry is an imaging test that uses a special X-ray to measure the amount of calcium and other minerals in your bones (bone density). This test is also known as a bone mineral density test or dual-energy X-ray absorptiometry (DXA). The test can measure bone density at your hip and your spine. It is similar to having a regular X-ray. You may have this test to:  Diagnose a condition that causes weak or thin bones (osteoporosis).  Predict your risk of a broken bone (fracture).  Determine how well osteoporosis treatment is working. LET [facility] CARE PROVIDER KNOW ABOUT:  Any allergies you have.  All medicines you are taking, including vitamins, herbs, eye drops, creams, and over-the-counter medicines.  Previous problems you or members of your family have had with the use of anesthetics.  Any blood disorders  you have.  Previous surgeries you have had.  Medical conditions you have.  Possibility of pregnancy.  Any other medical test you had within the previous 14 days that used contrast material. RISKS AND COMPLICATIONS Generally, this is a safe procedure. However, problems can occur and may include the following:  This test exposes you to a very small amount of radiation.  The risks of radiation exposure may be greater to unborn children. BEFORE THE PROCEDURE  Do not take any calcium supplements for 24 hours before having the test. You can otherwise eat and drink what you usually do.  Take off all metal jewelry, eyeglasses, dental appliances, and any other metal objects. PROCEDURE  You may lie on an exam table. There will be an X-ray generator below you and an imaging device above you.  Other devices, such as boxes or braces, may be used to position your body properly for the scan.  You will need to lie still while the machine slowly scans your body.  The images will show up on a computer monitor. AFTER THE PROCEDURE You may need more testing at a later time.   This information is not intended to replace advice given to you by your health care provider. Make sure you discuss any questions you have with your health care provider.   Document Released: 07/07/2004 Document Revised: 07/06/2014 Document Reviewed: 11/23/2013 Elsevier Interactive Patient Education 2016  [place] Maintenance, Female Adopting a healthy lifestyle and getting preventive care can go a long way to promote health and wellness. Talk with your health care provider about what schedule of regular examinations is right for you. This is a good chance for you to check in with your provider about disease prevention and staying healthy. In between checkups, there are plenty of things you can do on your own. Experts have done a lot of research about which lifestyle changes and preventive measures are most  likely to keep you healthy. Ask your health care provider for more information. WEIGHT AND DIET  Eat a healthy diet  Be sure to include plenty of vegetables, fruits, low-fat dairy products, and lean protein.  Do not eat a lot of foods high in solid fats, added sugars, or salt.  Get regular exercise. This is one of the most important things you can do for your health.  Most adults should exercise for at least 150 minutes each week. The exercise should increase your heart rate and make you sweat (moderate-intensity exercise).  Most adults should also do strengthening exercises at least twice a week. This is in addition to the moderate-intensity exercise.  Maintain a healthy weight  Body mass index (BMI) is a measurement that can be used to identify possible weight problems. It estimates body fat based on height and weight. Your health care provider can help determine your BMI and help you achieve or maintain a healthy weight.  For females 8 years of age and older:   A BMI below 18.5 is considered underweight.  A BMI of 18.5 to 24.9 is normal.  A BMI of 25 to 29.9 is considered overweight.  A BMI of 30 and above is considered obese.  Watch levels of cholesterol and blood lipids  You should start having your blood tested for lipids and cholesterol at 69 years of age, then have this test every 5 years.  You may need to have your cholesterol levels checked more often if:  Your lipid or cholesterol levels are high.  You are older than 69 years of age.  You are at high risk for heart disease.  CANCER SCREENING   Lung Cancer  Lung cancer screening is recommended for adults 71-45 years old who are at high risk for lung cancer because of a history of smoking.  A yearly low-dose CT scan of the lungs is recommended for people who:  Currently smoke.  Have quit within the past 15 years.  Have at least a 30-pack-year history of smoking. A pack year is smoking an average of one  pack of cigarettes a day for 1 year.  Yearly screening should continue until it has been 15 years since you quit.  Yearly screening should stop if you develop a health problem that would prevent you from having lung cancer treatment.  Breast Cancer  Practice breast self-awareness. This means understanding how your breasts normally appear and feel.  It also means doing regular breast self-exams. Let your health care provider know about any changes, no matter how small.  If you are in your 20s or 30s, you should have a clinical breast exam (CBE) by a health care provider every 1-3 years as part of a regular health exam.  If you are 66 or older, have a CBE every year. Also consider having a breast X-ray (mammogram) every year.  If you have a family history of breast cancer, talk to your health care provider about genetic  screening.  If you are at high risk for breast cancer, talk to your health care provider about having an MRI and a mammogram every year.  Breast cancer gene (BRCA) assessment is recommended for women who have family members with BRCA-related cancers. BRCA-related cancers include:  Breast.  Ovarian.  Tubal.  Peritoneal cancers.  Results of the assessment will determine the need for genetic counseling and BRCA1 and BRCA2 testing. Cervical Cancer Your health care provider may recommend that you be screened regularly for cancer of the pelvic organs (ovaries, uterus, and vagina). This screening involves a pelvic examination, including checking for microscopic changes to the surface of your cervix (Pap test). You may be encouraged to have this screening done every 3 years, beginning at age 29.  For women ages 20-65, health care providers may recommend pelvic exams and Pap testing every 3 years, or they may recommend the Pap and pelvic exam, combined with testing for human papilloma virus (HPV), every 5 years. Some types of HPV increase your risk of cervical cancer. Testing  for HPV may also be done on women of any age with unclear Pap test results.  Other health care providers may not recommend any screening for nonpregnant women who are considered low risk for pelvic cancer and who do not have symptoms. Ask your health care provider if a screening pelvic exam is right for you.  If you have had past treatment for cervical cancer or a condition that could lead to cancer, you need Pap tests and screening for cancer for at least 20 years after your treatment. If Pap tests have been discontinued, your risk factors (such as having a new sexual partner) need to be reassessed to determine if screening should resume. Some women have medical problems that increase the chance of getting cervical cancer. In these cases, your health care provider may recommend more frequent screening and Pap tests. Colorectal Cancer  This type of cancer can be detected and often prevented.  Routine colorectal cancer screening usually begins at 69 years of age and continues through 69 years of age.  Your health care provider may recommend screening at an earlier age if you have risk factors for colon cancer.  Your health care provider may also recommend using home test kits to check for hidden blood in the stool.  A small camera at the end of a tube can be used to examine your colon directly (sigmoidoscopy or colonoscopy). This is done to check for the earliest forms of colorectal cancer.  Routine screening usually begins at age 69.  Direct examination of the colon should be repeated every 5-10 years through 69 years of age. However, you may need to be screened more often if early forms of precancerous polyps or small growths are found. Skin Cancer  Check your skin from head to toe regularly.  Tell your health care provider about any new moles or changes in moles, especially if there is a change in a mole's shape or color.  Also tell your health care provider if you have a mole that is  larger than the size of a pencil eraser.  Always use sunscreen. Apply sunscreen liberally and repeatedly throughout the day.  Protect yourself by wearing long sleeves, pants, a wide-brimmed hat, and sunglasses whenever you are outside. HEART DISEASE, DIABETES, AND HIGH BLOOD PRESSURE   High blood pressure causes heart disease and increases the risk of stroke. High blood pressure is more likely to develop in:  People who have blood pressure  in the high end of the normal range (130-139/85-89 mm Hg).  People who are overweight or obese.  People who are African American.  If you are 60-74 years of age, have your blood pressure checked every 3-5 years. If you are 17 years of age or older, have your blood pressure checked every year. You should have your blood pressure measured twice--once when you are at a hospital or clinic, and once when you are not at a hospital or clinic. Record the average of the two measurements. To check your blood pressure when you are not at a hospital or clinic, you can use:  An automated blood pressure machine at a pharmacy.  A home blood pressure monitor.  If you are between 21 years and 28 years old, ask your health care provider if you should take aspirin to prevent strokes.  Have regular diabetes screenings. This involves taking a blood sample to check your fasting blood sugar level.  If you are at a normal weight and have a low risk for diabetes, have this test once every three years after 69 years of age.  If you are overweight and have a high risk for diabetes, consider being tested at a younger age or more often. PREVENTING INFECTION  Hepatitis B  If you have a higher risk for hepatitis B, you should be screened for this virus. You are considered at high risk for hepatitis B if:  You were born in a country where hepatitis B is common. Ask your health care provider which countries are considered high risk.  Your parents were born in a high-risk  country, and you have not been immunized against hepatitis B (hepatitis B vaccine).  You have HIV or AIDS.  You use needles to inject street drugs.  You live with someone who has hepatitis B.  You have had sex with someone who has hepatitis B.  You get hemodialysis treatment.  You take certain medicines for conditions, including cancer, organ transplantation, and autoimmune conditions. Hepatitis C  Blood testing is recommended for:  Everyone born from [birth year] through 1965.  Anyone with known risk factors for hepatitis C. Sexually transmitted infections (STIs)  You should be screened for sexually transmitted infections (STIs) including gonorrhea and chlamydia if:  You are sexually active and are younger than 69 years of age.  You are older than 69 years of age and your health care provider tells you that you are at risk for this type of infection.  Your sexual activity has changed since you were last screened and you are at an increased risk for chlamydia or gonorrhea. Ask your health care provider if you are at risk.  If you do not have HIV, but are at risk, it may be recommended that you take a prescription medicine daily to prevent HIV infection. This is called pre-exposure prophylaxis (PrEP). You are considered at risk if:  You are sexually active and do not regularly use condoms or know the HIV status of your partner(s).  You take drugs by injection.  You are sexually active with a partner who has HIV. Talk with your health care provider about whether you are at high risk of being infected with HIV. If you choose to begin PrEP, you should first be tested for HIV. You should then be tested every 3 months for as long as you are taking PrEP.  PREGNANCY   If you are premenopausal and you may become pregnant, ask your health care provider about preconception counseling.  If you may become pregnant, take 400 to 800 micrograms (mcg) of folic acid every day.  If you want to  prevent pregnancy, talk to your health care provider about birth control (contraception). OSTEOPOROSIS AND MENOPAUSE   Osteoporosis is a disease in which the bones lose minerals and strength with aging. This can result in serious bone fractures. Your risk for osteoporosis can be identified using a bone density scan.  If you are 73 years of age or older, or if you are at risk for osteoporosis and fractures, ask your health care provider if you should be screened.  Ask your health care provider whether you should take a calcium or vitamin D supplement to lower your risk for osteoporosis.  Menopause may have certain physical symptoms and risks.  Hormone replacement therapy may reduce some of these symptoms and risks. Talk to your health care provider about whether hormone replacement therapy is right for you.  HOME CARE INSTRUCTIONS   Schedule regular health, dental, and eye exams.  Stay current with your immunizations.   Do not use any tobacco products including cigarettes, chewing tobacco, or electronic cigarettes.  If you are pregnant, do not drink alcohol.  If you are breastfeeding, limit how much and how often you drink alcohol.  Limit alcohol intake to no more than 1 drink per day for nonpregnant women. One drink equals 12 ounces of beer, 5 ounces of wine, or 1 ounces of hard liquor.  Do not use street drugs.  Do not share needles.  Ask your health care provider for help if you need support or information about quitting drugs.  Tell your health care provider if you often feel depressed.  Tell your health care provider if you have ever been abused or do not feel safe at home.   This information is not intended to replace advice given to you by your health care provider. Make sure you discuss any questions you have with your health care provider.   Document Released: 12/29/2010 Document Revised: 07/06/2014 Document Reviewed: 05/17/2013 Elsevier Interactive Patient Education  [employer].

## 2015-05-17 NOTE — Telephone Encounter (Signed)
Ok to ref [employer] w/6 ref Corrin Parker

## 2015-05-17 NOTE — Progress Notes (Signed)
Subjective:   Paula Griffith is a 69 y.o. female who presents for Medicare Annual (Subsequent) preventive examination.  Review of Systems:   HRA assessment completed during visit;  The Patient was informed that this wellness visit is to identify risk and educate on how to reduce risk for increase disease through lifestyle changes.   ROS deferred to CPE exam with physician  Psychosocial;  Is in process of completing master's in [place] 38 years in [place];  4 dtrs; live in [place] / one in [place];  Dtr living her as well 3 boys; (72; 76 and 3)  Has remarried and had one child; lost twins;  Sister had Diabetes; sister lost her right leg;  3rd sister died of MI;  Biological father had MI Mother had pancreatic cancer  Medical issues  A1c 6.4 in Sept 2016  Pre diabetes reviewed but is actively exercising and losing weight;  Peripheral Vascular disease; states this does not impact her at present but does wear a back brace;  Hydrea for management of platelets and MPN; Per Hydrea information: Do not have immunizations/vaccinations without the consent of your doctor. Avoid contact with people who have recently received live vaccines (such as flu vaccine inhaled through the nose). Also discussed monitoring being around anyone who had a live virus; discussed this with the patient as she is around 69 yo and other grand-children  BMI: 25.8 Goal 145 Diet; doesn't eat red meat; eats [place] and chicken; baked; salads, vegetables and water; 3 meals;  Exercise; 150 abd lounge (sit ups); does with back brace every day; Uses step machine every day; has this equipment at home. Exercise for abd lounge recommended by back doctor   Safety; 1 level; stay there  reviewed for the home; including removal of clutter; clear paths through the home, eliminating clutter, railing as needed; bathroom safety; had tub; community safety; smoke detectors; carbon monoxide and firearms safety  Driving accidents  (no)  Sun protection/ no; recommended sunscreen;  Medication review/ No new meds  Fall assessment / times one week ago; step down in home; had her back brace on; Did not get hurt; This was not in her home.  Gait assessment/ gets up and down quickly  Mobilization and Functional losses in the last year; no  Sleep patterns/ not addressed today  Urinary or fecal incontinence reviewed/ no issue with either  Counseling: Hep C; will wait on next physical when blood draw Colonoscopy; 07/26/2012; repeat in 10 years/ Jan 2024 EKG: has had one in the past; educated regarding chest pain in women  Hearing: hearing is excellent   Dexa- will have dexa at Office; ordered to complete by December  Mammogram; 11/2014 No mammographic evidence of malignancy.  Ophthalmology exam; needs to find an eye doctor; Will check with insurer; Will schedule this year;   Immunizations Due  Tetanus/ been a couple of years; States she cut her finger and she didn't bleed and this is when she was diagnosed;  Zostavax; is a live vaccine; had shingles as an adult in her 18's 2nd pneumonia Prevnar 13/  Will discuss with hematologist/oncologist Dr. Burr Medico; apt in Dec  Current Care Team reviewed and updated Dr. Ralene Ok;  Dr. Andree Elk at the pain center;  Cardiac Risk Factors include: advanced age (>28men, >66 women);hypertension     Objective:     Vitals: BP 138/70 mmHg  Ht 5\' 5"  (1.651 m)  Wt 155 lb 4 oz (70.421 kg)  BMI 25.83 kg/m2  Tobacco History  Smoking  status  . Never Smoker   Smokeless tobacco  . Never Used     Counseling given: Yes   Past Medical History  Diagnosis Date  . Colon polyps   . Diverticulosis of colon   . HTN (hypertension)   . LBP (low back pain)   . Osteoarthritis   . Asthma   . Allergic rhinitis   . Glucose intolerance (impaired glucose tolerance)   . PVD (peripheral vascular disease) ([place]) 2011    Right Carotid Dr Scot Dock  . 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  Thrombocytopathy [facility]) 2011    Dr Ralene Ok   Past Surgical History  Procedure Laterality Date  . Abdominal hysterectomy  1989   Family History  Problem Relation Age of Onset  . Coronary artery disease      Female 1st degree relative Female <60  . Lung cancer    . Pancreatic cancer Mother   . Arthritis Mother     RA  . Early death Father 67    MI  . Diabetes Sister   . Colon cancer Neg Hx   . Stomach cancer Neg Hx    History  Sexual Activity  . Sexual Activity: Not on file    Outpatient Encounter Prescriptions as of 05/17/2015  Medication Sig  . albuterol (PROAIR HFA) 108 (90 BASE) MCG/ACT inhaler Inhale 2 puffs into the lungs every 4 (four) hours as needed. For shortness of breath  . amLODipine (NORVASC) 5 MG tablet TAKE ONE TABLET BY MOUTH ONCE DAILY  . aspirin 325 MG EC tablet Take 325 mg by mouth daily after breakfast.    . Cholecalciferol 1000 UNITS tablet Take 1,000 Units by mouth daily.    . cyclobenzaprine (FLEXERIL) 10 MG tablet Take 10 mg by mouth 2 (two) times daily.  Marland Kitchen gabapentin (NEURONTIN) 100 MG capsule Take 1 capsule (100 mg total) by mouth 3 (three) times daily.  . hydroxyurea (HYDREA) 500 MG capsule Take one capsule (500 mg) by mouth daily everyday except 2 capsules (1,000 mg) by mouth on Monday and Thursday.  Marland Kitchen ibuprofen (ADVIL,MOTRIN) 600 MG tablet TAKE ONE TABLET BY MOUTH TWICE DAILY AS NEEDED  . loratadine (CLARITIN) 10 MG tablet TAKE ONE TABLET BY MOUTH ONCE DAILY  . losartan-hydrochlorothiazide (HYZAAR) 100-25 MG tablet TAKE ONE TABLET BY MOUTH ONCE DAILY  . meloxicam (MOBIC) 7.5 MG tablet Take 7.5 mg by mouth 2 (two) times daily.  . Multiple Vitamin (MULTIVITAMIN) capsule Take 2 capsules by mouth daily.   . potassium chloride (K-DUR) 10 MEQ tablet TAKE ONE TABLET BY MOUTH ONCE DAILY  . promethazine (PHENERGAN) 12.5 MG tablet Take 1-2 tablets (12.5-25 mg total) by mouth every 6 (six) hours as needed for nausea or vomiting.  . SYMBICORT 80-4.5 MCG/ACT  inhaler INHALE TWO PUFFS INTO LUNGS TWICE DAILY  . [DISCONTINUED] predniSONE (DELTASONE) 10 MG tablet Take 10 mg by mouth daily.   No facility-administered encounter medications on file as of 05/17/2015.    Activities of Daily Living In your present state of health, do you have any difficulty performing the following activities: 05/17/2015  Hearing? N  Vision? N  Difficulty concentrating or making decisions? N  Walking or climbing stairs? N  Dressing or bathing? N  Doing errands, shopping? N  Preparing Food and eating ? N  Using the Toilet? N  In the past six months, have you accidently leaked urine? N  Do you have problems with loss of bowel control? N  Managing your Medications? N  Managing your Finances? N  Housekeeping or managing your Housekeeping? N    Patient Care Team: Cassandria Anger, MD as PCP - General Angelia Mould, MD (Vascular Surgery) Jeanie Cooks, MD (Hematology and Oncology) Ara Kussmaul, MD (Ophthalmology)    Assessment:    Assessment   Patient presents for yearly preventative medicine examination. Medicare questionnaire screening were completed, i.e. Functional; fall risk; depression, memory loss and hearing were unremarkable;   All immunizations and health maintenance protocols were reviewed with the patient and she was told she was immunosuppressed; Reviewed the medication Hydrea and vaccines deferred to the oncologist;  Hx of shingles in her 77's; given information on isolating from anyone that has taken a live vaccine or sick child and flu virus  Education provided for laboratory screens;    Medication reconciliation, past medical history, social history, problem list and allergies were reviewed in detail with the patient   Goals were established with regard to weight loss, exercise, and diet in compliance with medications based on the patient individualized risk; The patient is very determined to lose down to 145. Monitoring  any sweets; eating nutritious food and drinking water;   Educated on pre-diabetes; referred to [email or web address]. Encouraged keeping her current exercise regiment;   End of life planning was discussed. Spent approx 20 minutes face to face;  Reviewed the AD form at  [facility]. Reviewed HCPOA and then the Living will. Reviewed the form in it's entirety including tube feeds and withdrawal of life support, as well as the the HCPOA being allowed to change the directive or not; Allowed for questions to be answered Educated on prolonging life measures; and reviewed examples for increased understanding. Educated on 2 witnesses to sign with notary. Also discussed ability to revoke when and if she wants; The patient has 4 dtrs of which the oldest is the HCPOA at present and lives in [place] and has been considering changing or updating her POA. Referred to MD for further questions.  The patient will fup and bring a copy to the office when completed.    Exercise Activities and Dietary recommendations Current Exercise Habits:: Home exercise routine, Time (Minutes): 60, Frequency (Times/Week): > 6, Weekly Exercise (Minutes/Week): 0, Intensity: Moderate  Goals    None     Fall Risk Fall Risk  05/17/2015 09/24/2014  Falls in the past year? Yes Yes  Number falls in past yr: 1 1  Injury with Fall? Yes Yes  Follow up Education provided -   Depression Screen PHQ 2/9 Scores 09/24/2014  PHQ - 2 Score 0    No issues;   Cognitive Testing No flowsheet data found.  Immunization History  Administered Date(s) Administered  . Influenza Split 04/08/2011, 04/08/2012  . Influenza,inj,Quad PF,36+ Mos 03/15/2013, 04/23/2014, 03/21/2015  . Pneumococcal Polysaccharide-23 12/09/2012   Very bright and engaging senior currently 1 year from receipt of Masters in [place];  Screening Tests Health Maintenance  Topic Date Due  . Hepatitis C Screening  May 26, 1946  . TETANUS/TDAP  10/13/1964  . ZOSTAVAX  10/13/2005  . DEXA  SCAN  10/14/2010  . PNA vac Low Risk Adult (2 of 2 - PCV13) 12/09/2013  . INFLUENZA VACCINE  01/28/2016  . MAMMOGRAM  12/24/2016  . COLONOSCOPY  07/26/2022      Plan:     Reviewed all screens;  Will plan to take hep c at next blood with Dr. Camila Li Will schedule eye exam Dexa ordered and will complete. Sees oncologist in Dec and will defer appropriate  vaccines to oncology.  During the course of the visit the patient was educated and counseled about the following appropriate screening and preventive services:   Vaccines to include Pneumoccal, Influenza, Hepatitis B, Td, Zostavax, HCV/ TD, shingles and Prevnar discussed; Would not be appropriate for zostavax but can check with oncologist regarding other vaccines  Electrocardiogram/ not noted by states it was completed  Cardiovascular Disease/ BP in good control; Losing weight currently; exercising every day  Colorectal cancer screening; due to Jan 2024  Bone density screening/ DUE and ordered today   Diabetes screening/ A1c was pre-diabetic and this was dicussed and educated   Glaucoma screening/ eye exam to be scheduled; eye doctor closed his practice but will outreach insurance for recommendation  Mammography/PAP/ completed and was normal  Nutrition counseling / nutritious diet in place;   Patient Instructions (the written plan) was given to the patient.   Wynetta Fines, RN  05/17/2015

## 2015-05-17 NOTE — Telephone Encounter (Signed)
Patient in for AWV; stated he just needed a refill on the Proair/ has not been refilled since 2013;  Completed and given 3 refills.   OV was in April and again in Sept ([place])  for acute sting.  Just FYI, please advise if other needed / AWV sent for your review.

## 2015-06-10 ENCOUNTER — Ambulatory Visit (HOSPITAL_BASED_OUTPATIENT_CLINIC_OR_DEPARTMENT_OTHER): Payer: Medicare Other | Admitting: Hematology

## 2015-06-10 ENCOUNTER — Encounter: Payer: Self-pay | Admitting: Hematology

## 2015-06-10 ENCOUNTER — Other Ambulatory Visit (HOSPITAL_BASED_OUTPATIENT_CLINIC_OR_DEPARTMENT_OTHER): Payer: Medicare Other

## 2015-06-10 ENCOUNTER — Telehealth: Payer: Self-pay | Admitting: Hematology

## 2015-06-10 VITALS — BP 127/64 | HR 88 | Temp 98.5°F | Resp 19 | Ht 65.0 in | Wt 154.4 lb

## 2015-06-10 DIAGNOSIS — D471 Chronic myeloproliferative disease: Secondary | ICD-10-CM

## 2015-06-10 DIAGNOSIS — D473 Essential (hemorrhagic) thrombocythemia: Secondary | ICD-10-CM

## 2015-06-10 LAB — COMPREHENSIVE METABOLIC PANEL
ALBUMIN: 4 g/dL (ref 3.5–5.0)
ANION GAP: 12 meq/L — AB (ref 3–11)
AST: 19 U/L (ref 5–34)
Alkaline Phosphatase: 61 U/L (ref 40–150)
BILIRUBIN TOTAL: 0.49 mg/dL (ref 0.20–1.20)
BUN: 12.5 mg/dL (ref 7.0–26.0)
CALCIUM: 9.6 mg/dL (ref 8.4–10.4)
CO2: 27 mEq/L (ref 22–29)
CREATININE: 1 mg/dL (ref 0.6–1.1)
Chloride: 101 mEq/L (ref 98–109)
EGFR: 70 mL/min/{1.73_m2} — ABNORMAL LOW (ref >90)
Glucose: 144 mg/dl — ABNORMAL HIGH (ref 70–140)
Potassium: 3.7 mEq/L (ref 3.5–5.1)
Sodium: 140 mEq/L (ref 136–145)
TOTAL PROTEIN: 7.3 g/dL (ref 6.4–8.3)

## 2015-06-10 LAB — CBC WITH DIFFERENTIAL/PLATELET
BASO%: 0.6 % (ref 0.0–2.0)
BASOS ABS: 0 10*3/uL (ref 0.0–0.1)
EOS%: 0.5 % (ref 0.0–7.0)
Eosinophils Absolute: 0 10*3/uL (ref 0.0–0.5)
HEMATOCRIT: 33.5 % — AB (ref 34.8–46.6)
HGB: 11.4 g/dL — ABNORMAL LOW (ref 11.6–15.9)
LYMPH#: 1.6 10*3/uL (ref 0.9–3.3)
LYMPH%: 28.5 % (ref 14.0–49.7)
MCH: 37.1 pg — ABNORMAL HIGH (ref 25.1–34.0)
MCHC: 34.2 g/dL (ref 31.5–36.0)
MCV: 108.5 fL — ABNORMAL HIGH (ref 79.5–101.0)
MONO#: 0.5 10*3/uL (ref 0.1–0.9)
MONO%: 7.9 % (ref 0.0–14.0)
NEUT#: 3.6 10*3/uL (ref 1.5–6.5)
NEUT%: 62.5 % (ref 38.4–76.8)
PLATELETS: 477 10*3/uL — AB (ref 145–400)
RBC: 3.08 10*6/uL — ABNORMAL LOW (ref 3.70–5.45)
RDW: 12.9 % (ref 11.2–14.5)
WBC: 5.7 10*3/uL (ref 3.9–10.3)

## 2015-06-10 MED ORDER — ANAGRELIDE HCL 0.5 MG PO CAPS: 0.5000 mg | ORAL_CAPSULE | Freq: Two times a day (BID) | ORAL | Status: DC

## 2015-06-10 NOTE — Telephone Encounter (Signed)
Gave patient avs report and appointments for December and January  °

## 2015-06-10 NOTE — Progress Notes (Signed)
[place] OFFICE PROGRESS NOTE  Paula Kehr, MD [place] [address]  DIAGNOSIS: Essential thrombocythemia - Plan: hydroxyurea (HYDREA) 500 MG capsule, CBC with Differential, Comprehensive metabolic panel (Cmet) - [facility]  MPN (myeloproliferative neoplasm)  PROBLEM LIST: 1. Essential thrombocythemia with thrombocytosis going back to 2000. Mutations for JAK2 V617F, Exon 12, and MPL515 were negative. The patient had a bone marrow on 11/14/2009 which showed slightly hypercellular marrow for the patient's age, along with abundant iron stores. There was minimal reticulin fibrosis. Trichrome stains failed to show any definite collagenous fibrosis. The patient is maintained on hydroxyurea and aspirin since mid June 2011.  2. Right retinal artery occlusion and subsequent retinal infarct on 05/22/2009. The patient's vision has returned to near normal.  3. Peripheral vascular disease involving the right carotid artery detected 08/05/2009.  4. Hypertension  5. Allergic rhinitis  6. History of asthma  7. Adenomatous colonic polyps.  8. Diverticulosis  Chief complaint: Follow-up  CURRENT THERAPY: Hydrea 500 mg  Every day except 1,000 mg on M and Thurs.  Aspirin 325 mg daily.   INTERVAL HISTORY: Paula Griffith 69 y.o. female with presumed essential thrombocythemia currently maintained on aspirin and hydroxyurea is here for follow-up.  She was last seen by me 6 month ago.   She is clinically doing very well, denies any significant pain, dyspnea, leg swelling or other symptoms.. She has been compliant with Hydrea, but her pharmacy dispensed only as 500. When daily, so she missed 4 days of Hydrea at the beginning of last months and at this months. She has no noticeable side effects from Hydrea.  MEDICAL HISTORY: Past Medical History  Diagnosis Date  . Colon polyps   . Diverticulosis of colon   . HTN (hypertension)   . LBP (low back pain)   . Osteoarthritis   .  Asthma   . Allergic rhinitis   . Glucose intolerance (impaired glucose tolerance)   . PVD (peripheral vascular disease) ([place]) 2011    Right Carotid Dr Scot Dock  . 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 Thrombocytopathy [facility]) 2011    Dr Ralene Ok      ALLERGIES:  is allergic to benazepril; codeine; and tramadol.  MEDICATIONS: has a current medication list which includes the following prescription(s): albuterol, amlodipine, aspirin, cholecalciferol, cyclobenzaprine, gabapentin, hydroxyurea, ibuprofen, loratadine, losartan-hydrochlorothiazide, meloxicam, multivitamin, potassium chloride, promethazine, and symbicort.  SURGICAL HISTORY:  Past Surgical History  Procedure Laterality Date  . Abdominal hysterectomy  1989    REVIEW OF SYSTEMS:   Constitutional: Denies fevers, chills or abnormal weight loss Eyes: Denies blurriness of vision Ears, nose, mouth, throat, and face: Denies mucositis or sore throat Respiratory: Denies cough, dyspnea or wheezes Cardiovascular: Denies palpitation, chest discomfort or lower extremity swelling Gastrointestinal:  Denies nausea, heartburn or change in bowel habits Skin: Denies abnormal skin rashes Lymphatics: Denies new lymphadenopathy or easy bruising Neurological:Denies numbness, tingling or new weaknesses Behavioral/Psych: Mood is stable, no new changes  All other systems were reviewed with the patient and are negative.  PHYSICAL EXAMINATION: ECOG PERFORMANCE STATUS: 0 - Asymptomatic  Blood pressure 127/64, pulse 88, temperature 98.5 F (36.9 C), temperature source Oral, resp. rate 19, height 5\' 5"  (1.651 m), weight 154 lb 6.4 oz (70.035 kg), SpO2 99 %.  GENERAL:alert, no distress and comfortable; well developed and well nourished. SKIN: skin color, texture, turgor are normal, no rashes or significant lesions EYES: normal, Conjunctiva are pink and non-injected, sclera clear OROPHARYNX:no exudate, no erythema  and lips, buccal mucosa, and tongue  normal  NECK: supple, thyroid normal size, non-tender, without nodularity LYMPH:  no palpable lymphadenopathy in the cervical, axillary or supraclavicular LUNGS: clear to auscultation and percussion with normal breathing effort HEART: regular rate & rhythm and no murmurs and no lower extremity edema ABDOMEN:abdomen soft, non-tender and normal bowel sounds Musculoskeletal:no cyanosis of digits and no clubbing ; R ankle larger than Left with slight tenderness to palpation.  NEURO: alert & oriented x 3 with fluent speech, no focal motor/sensory deficits   LABORATORY DATA: CBC Latest Ref Rng 06/10/2015 03/21/2015 12/24/2014  WBC 3.9 - 10.3 10e3/uL 5.7 8.1 5.8  Hemoglobin 11.6 - 15.9 g/dL 11.4(L) 10.4(L) 10.3(L)  Hematocrit 34.8 - 46.6 % 33.5(L) 31.2(L) 30.1(L)  Platelets 145 - 400 10e3/uL 477(H) 504.0(H) 386    CMP Latest Ref Rng 03/21/2015 12/24/2014 09/11/2014  Glucose 70 - 99 mg/dL 135(H) 158(H) 168(H)  BUN 6 - 23 mg/dL 14 20.6 8.7  Creatinine 0.40 - 1.20 mg/dL 1.06 1.0 0.9  Sodium 135 - 145 mEq/L 144 137 141  Potassium 3.5 - 5.1 mEq/L 3.5 4.1 3.6  Chloride 96 - 112 mEq/L 102 - -  CO2 19 - 32 mEq/L 33(H) 29 26  Calcium 8.4 - 10.5 mg/dL 9.8 9.4 9.2  Total Protein 6.0 - 8.3 g/dL 7.0 6.6 7.1  Total Bilirubin 0.2 - 1.2 mg/dL 0.3 0.40 0.49  Alkaline Phos 39 - 117 U/L 49 57 48  AST 0 - 37 U/L 20 15 17   ALT 0 - 35 U/L 9 9 8       RADIOGRAPHIC STUDIES: No results found.  ASSESSMENT: Paula Griffith 69 y.o. female with a history of Essential thrombocythemia    1. 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  -Her platelet count has been elevated in the 400 up to 500 range lately, partially related to her missing dose of Hydrea. She also has mild anemia.   -Her anemia may suddenly get worse if I increased her Hydrea dose. I recommend her to add on anagrelide 0.5 mg twice daily, and decrease her Hydrea to 500 mg once daily, to balance her anemia and platelet  count. The other option is switched her to anagrelide alone. -The potential side effects and effect of anagrelide on her platelet count were discussed with her in details, she agrees to proceed with the first option -She'll continue her aspirin   2. Arthritis and back pain  -She will continue follow-up with her  PCP and pain management clinic.  3. Hypertension and other medical problems She'll continue follow-up with her primary care physician  Plan -Decrease in her Hydrea to 500 mg once daily -Add on anagrelide 0.5 mg twice daily, prescription was sent to her pharmacy today -Repeat a CBC in 2 weeks and I'll see her back in 4 weeks.  All questions were answered. The patient knows to call the clinic with any problems, questions or concerns. We can certainly see the patient much sooner if necessary.  I spent 20 minutes counseling the patient face to face. The total time spent in the appointment was 25 minutes.     Truitt Merle, MD 06/10/2015 8:53 AM

## 2015-06-26 ENCOUNTER — Other Ambulatory Visit: Payer: Self-pay | Admitting: *Deleted

## 2015-06-26 DIAGNOSIS — D471 Chronic myeloproliferative disease: Secondary | ICD-10-CM

## 2015-06-27 ENCOUNTER — Other Ambulatory Visit (HOSPITAL_BASED_OUTPATIENT_CLINIC_OR_DEPARTMENT_OTHER): Payer: Medicare Other

## 2015-06-27 DIAGNOSIS — D471 Chronic myeloproliferative disease: Secondary | ICD-10-CM | POA: Diagnosis not present

## 2015-06-27 LAB — CBC WITH DIFFERENTIAL/PLATELET
BASO%: 0.3 % (ref 0.0–2.0)
BASOS ABS: 0 10*3/uL (ref 0.0–0.1)
EOS%: 0.6 % (ref 0.0–7.0)
Eosinophils Absolute: 0.1 10*3/uL (ref 0.0–0.5)
HCT: 30.3 % — ABNORMAL LOW (ref 34.8–46.6)
HEMOGLOBIN: 10.5 g/dL — AB (ref 11.6–15.9)
LYMPH#: 2.3 10*3/uL (ref 0.9–3.3)
LYMPH%: 22.9 % (ref 14.0–49.7)
MCH: 36 pg — ABNORMAL HIGH (ref 25.1–34.0)
MCHC: 34.7 g/dL (ref 31.5–36.0)
MCV: 103.8 fL — AB (ref 79.5–101.0)
MONO#: 0.8 10*3/uL (ref 0.1–0.9)
MONO%: 7.4 % (ref 0.0–14.0)
NEUT#: 7 10*3/uL — ABNORMAL HIGH (ref 1.5–6.5)
NEUT%: 68.8 % (ref 38.4–76.8)
NRBC: 0 % (ref 0–0)
Platelets: 138 10*3/uL — ABNORMAL LOW (ref 145–400)
RBC: 2.92 10*6/uL — ABNORMAL LOW (ref 3.70–5.45)
RDW: 12.8 % (ref 11.2–14.5)
WBC: 10.1 10*3/uL (ref 3.9–10.3)

## 2015-06-28 ENCOUNTER — Telehealth: Payer: Self-pay | Admitting: *Deleted

## 2015-06-28 ENCOUNTER — Encounter: Payer: Self-pay | Admitting: *Deleted

## 2015-06-28 NOTE — Telephone Encounter (Signed)
-----   Message from Truitt Merle, MD sent at 06/28/2015  8:43 AM EST ----- Janifer Adie,  Please call pt and let her know the lab result. Her plt has dropped, I recommend her to decrease anagrelide to once daily (keep hydrea as same dose daily), and I will see her in 2 weeks as scheduled.   Truitt Merle

## 2015-06-28 NOTE — Telephone Encounter (Signed)
Notified pt of decreased platelets & need to decrease anagrelide to once daily & keep hydrea at once daily & keep Jan appt with Dr Burr Medico.  Pt expressed understanding.

## 2015-07-05 ENCOUNTER — Other Ambulatory Visit: Payer: Self-pay | Admitting: Internal Medicine

## 2015-07-11 ENCOUNTER — Other Ambulatory Visit (HOSPITAL_BASED_OUTPATIENT_CLINIC_OR_DEPARTMENT_OTHER): Payer: Medicare Other

## 2015-07-11 ENCOUNTER — Encounter: Payer: Self-pay | Admitting: Hematology

## 2015-07-11 ENCOUNTER — Ambulatory Visit (HOSPITAL_BASED_OUTPATIENT_CLINIC_OR_DEPARTMENT_OTHER): Payer: Medicare Other | Admitting: Hematology

## 2015-07-11 VITALS — BP 135/62 | HR 86 | Temp 99.5°F | Resp 17 | Ht 65.0 in | Wt 153.7 lb

## 2015-07-11 DIAGNOSIS — D471 Chronic myeloproliferative disease: Secondary | ICD-10-CM | POA: Diagnosis not present

## 2015-07-11 DIAGNOSIS — D473 Essential (hemorrhagic) thrombocythemia: Secondary | ICD-10-CM | POA: Diagnosis not present

## 2015-07-11 LAB — CBC WITH DIFFERENTIAL/PLATELET
BASO%: 0.5 % (ref 0.0–2.0)
BASOS ABS: 0 10*3/uL (ref 0.0–0.1)
EOS ABS: 0.1 10*3/uL (ref 0.0–0.5)
EOS%: 1 % (ref 0.0–7.0)
HCT: 31.7 % — ABNORMAL LOW (ref 34.8–46.6)
HGB: 10.9 g/dL — ABNORMAL LOW (ref 11.6–15.9)
LYMPH%: 37.7 % (ref 14.0–49.7)
MCH: 36.5 pg — AB (ref 25.1–34.0)
MCHC: 34.4 g/dL (ref 31.5–36.0)
MCV: 106 fL — ABNORMAL HIGH (ref 79.5–101.0)
MONO#: 0.5 10*3/uL (ref 0.1–0.9)
MONO%: 9 % (ref 0.0–14.0)
NEUT%: 51.8 % (ref 38.4–76.8)
NEUTROS ABS: 3 10*3/uL (ref 1.5–6.5)
Platelets: 420 10*3/uL — ABNORMAL HIGH (ref 145–400)
RBC: 2.99 10*6/uL — AB (ref 3.70–5.45)
RDW: 12.8 % (ref 11.2–14.5)
WBC: 5.8 10*3/uL (ref 3.9–10.3)
lymph#: 2.2 10*3/uL (ref 0.9–3.3)

## 2015-07-11 LAB — COMPREHENSIVE METABOLIC PANEL
ALT: <9 U/L (ref 0–55)
AST: 16 U/L (ref 5–34)
Albumin: 3.8 g/dL (ref 3.5–5.0)
Alkaline Phosphatase: 67 U/L (ref 40–150)
Anion Gap: 10 mEq/L (ref 3–11)
BUN: 16 mg/dL (ref 7.0–26.0)
CALCIUM: 9.5 mg/dL (ref 8.4–10.4)
CHLORIDE: 105 meq/L (ref 98–109)
CO2: 27 meq/L (ref 22–29)
Creatinine: 1.1 mg/dL (ref 0.6–1.1)
EGFR: 60 mL/min/{1.73_m2} — ABNORMAL LOW (ref >90)
GLUCOSE: 142 mg/dL — AB (ref 70–140)
POTASSIUM: 3.7 meq/L (ref 3.5–5.1)
SODIUM: 142 meq/L (ref 136–145)
Total Bilirubin: 0.31 mg/dL (ref 0.20–1.20)
Total Protein: 7.4 g/dL (ref 6.4–8.3)

## 2015-07-11 NOTE — Progress Notes (Signed)
[place] OFFICE PROGRESS NOTE  Paula Kehr, MD [place] [address]  DIAGNOSIS: Essential thrombocythemia - Plan: hydroxyurea (HYDREA) 500 MG capsule, CBC with Differential, Comprehensive metabolic panel (Cmet) - [facility]  MPN (myeloproliferative neoplasm)  PROBLEM LIST: 1. Essential thrombocythemia with thrombocytosis going back to 2000. Mutations for JAK2 V617F, Exon 12, and MPL515 were negative. The patient had a bone marrow on 11/14/2009 which showed slightly hypercellular marrow for the patient's age, along with abundant iron stores. There was minimal reticulin fibrosis. Trichrome stains failed to show any definite collagenous fibrosis. The patient is maintained on hydroxyurea and aspirin since mid June 2011.  2. Right retinal artery occlusion and subsequent retinal infarct on 05/22/2009. The patient's vision has returned to near normal.  3. Peripheral vascular disease involving the right carotid artery detected 08/05/2009.  4. Hypertension  5. Allergic rhinitis  6. History of asthma  7. Adenomatous colonic polyps.  8. Diverticulosis  Chief complaint: Follow-up  CURRENT THERAPY:  1.Hydrea 500 mg  Every day except 1,000 mg on M and Thurs. Decreased to 500mg  daily on 06/10/2015, and further decreased     to 500mg  daily except M andThursday  2. Added anagrelide 0.5 mg twice daily on 06/10/2015  3. Aspirin 325 mg daily.   INTERVAL HISTORY: Paula Griffith 70 y.o. female with presumed essential thrombocythemia currently maintained on aspirin and hydroxyurea is here for follow-up.  She was last seen by me 1 month ago and her medication was adjusted on last visit.  She is clinically doing very well, denies any significant dyspnea, bleeding, or other symptoms. Her CBC showed mild thrombocytopenia 2 weeks ago, so I changed her anagrelide from twice daily to once daily.  MEDICAL HISTORY: Past Medical History  Diagnosis Date  . Colon polyps   .  Diverticulosis of colon   . HTN (hypertension)   . LBP (low back pain)   . Osteoarthritis   . Asthma   . Allergic rhinitis   . Glucose intolerance (impaired glucose tolerance)   . PVD (peripheral vascular disease) ([place]) 2011    Right Carotid Dr Scot Dock  . 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 Thrombocytopathy [facility]) 2011    Dr Ralene Ok      ALLERGIES:  is allergic to benazepril; codeine; and tramadol.  MEDICATIONS: has a current medication list which includes the following prescription(s): albuterol, amlodipine, anagrelide, aspirin, cholecalciferol, cyclobenzaprine, gabapentin, hydroxyurea, ibuprofen, loratadine, losartan-hydrochlorothiazide, meloxicam, multivitamin, potassium chloride, promethazine, and symbicort.  SURGICAL HISTORY:  Past Surgical History  Procedure Laterality Date  . Abdominal hysterectomy  1989    REVIEW OF SYSTEMS:   Constitutional: Denies fevers, chills or abnormal weight loss Eyes: Denies blurriness of vision Ears, nose, mouth, throat, and face: Denies mucositis or sore throat Respiratory: Denies cough, dyspnea or wheezes Cardiovascular: Denies palpitation, chest discomfort or lower extremity swelling Gastrointestinal:  Denies nausea, heartburn or change in bowel habits Skin: Denies abnormal skin rashes Lymphatics: Denies new lymphadenopathy or easy bruising Neurological:Denies numbness, tingling or new weaknesses Behavioral/Psych: Mood is stable, no new changes  All other systems were reviewed with the patient and are negative.  PHYSICAL EXAMINATION: ECOG PERFORMANCE STATUS: 0 - Asymptomatic  Blood pressure 135/62, pulse 86, temperature 99.5 F (37.5 C), temperature source Oral, resp. rate 17, height 5\' 5"  (1.651 m), weight 153 lb 11.2 oz (69.718 kg), SpO2 100 %.  GENERAL:alert, no distress and comfortable; well developed and well nourished. SKIN: skin color, texture, turgor are normal, no rashes  or significant lesions EYES: normal, Conjunctiva are  pink and non-injected, sclera clear OROPHARYNX:no exudate, no erythema and lips, buccal mucosa, and tongue normal  NECK: supple, thyroid normal size, non-tender, without nodularity LYMPH:  no palpable lymphadenopathy in the cervical, axillary or supraclavicular LUNGS: clear to auscultation and percussion with normal breathing effort HEART: regular rate & rhythm and no murmurs and no lower extremity edema ABDOMEN:abdomen soft, non-tender and normal bowel sounds Musculoskeletal:no cyanosis of digits and no clubbing ; R ankle larger than Left with slight tenderness to palpation.  NEURO: alert & oriented x 3 with fluent speech, no focal motor/sensory deficits   LABORATORY DATA: CBC Latest Ref Rng 07/11/2015 06/27/2015 06/10/2015  WBC 3.9 - 10.3 10e3/uL 5.8 10.1 5.7  Hemoglobin 11.6 - 15.9 g/dL 10.9(L) 10.5(L) 11.4(L)  Hematocrit 34.8 - 46.6 % 31.7(L) 30.3(L) 33.5(L)  Platelets 145 - 400 10e3/uL 420(H) 138(L) 477(H)    CMP Latest Ref Rng 07/11/2015 06/10/2015 03/21/2015  Glucose 70 - 140 mg/dl 142(H) 144(H) 135(H)  BUN 7.0 - 26.0 mg/dL 16.0 12.5 14  Creatinine 0.6 - 1.1 mg/dL 1.1 1.0 1.06  Sodium 136 - 145 mEq/L 142 140 144  Potassium 3.5 - 5.1 mEq/L 3.7 3.7 3.5  Chloride 96 - 112 mEq/L - - 102  CO2 22 - 29 mEq/L 27 27 33(H)  Calcium 8.4 - 10.4 mg/dL 9.5 9.6 9.8  Total Protein 6.4 - 8.3 g/dL 7.4 7.3 7.0  Total Bilirubin 0.20 - 1.20 mg/dL 0.31 0.49 0.3  Alkaline Phos 40 - 150 U/L 67 61 49  AST 5 - 34 U/L 16 19 20   ALT 0 - 55 U/L <9 <9 9      RADIOGRAPHIC STUDIES: No results found.  ASSESSMENT: Paula Griffith 70 y.o. female with a history of Essential thrombocythemia    1. Myeloproliferative neoplasm-essential thrombocythemia, JAK2 mutation negative.  -We reviewed the natural history of ET, most patients do well well, major complication is thrombosis. -due to her anemia, I will further decrease her hydrea from 500mg  daily to 500mg  daily except M and [name] -She still has moderate  thrombocytosis, we'll increase anagrelide back to 0.5 mg twice daily -CBC in 2 weeks, I'll see her back in 1 week  2. Arthritis and back pain  -She will continue follow-up with her  PCP and pain management clinic.  3. Hypertension and other medical problems She'll continue follow-up with her primary care physician  Plan -Decrease Hydrea to 500 mg once daily except Monday and Thursday  -increase anagrelide 0.5 mg from daily to twice daily -Repeat a CBC in 2 weeks and I'll see her back in 4 weeks.  All questions were answered. The patient knows to call the clinic with any problems, questions or concerns. We can certainly see the patient much sooner if necessary.  I spent 15 minutes counseling the patient face to face. The total time spent in the appointment was 20 minutes.     Truitt Merle, MD 07/11/2015

## 2015-07-14 ENCOUNTER — Other Ambulatory Visit: Payer: Self-pay | Admitting: Hematology

## 2015-07-15 ENCOUNTER — Telehealth: Payer: Self-pay | Admitting: Hematology

## 2015-07-15 NOTE — Telephone Encounter (Signed)
per pof to sch pt appt-cld pt and gave pt appt times and date

## 2015-07-17 ENCOUNTER — Telehealth: Payer: Self-pay | Admitting: Internal Medicine

## 2015-07-17 MED ORDER — GABAPENTIN 100 MG PO CAPS: 100.0000 mg | ORAL_CAPSULE | Freq: Three times a day (TID) | ORAL | Status: DC

## 2015-07-17 MED ORDER — IBUPROFEN 600 MG PO TABS: 600.0000 mg | ORAL_TABLET | Freq: Two times a day (BID) | ORAL | Status: DC | PRN

## 2015-07-17 MED ORDER — LORATADINE 10 MG PO TABS: 10.0000 mg | ORAL_TABLET | Freq: Every day | ORAL | Status: DC

## 2015-07-17 MED ORDER — LOSARTAN POTASSIUM-HCTZ 100-25 MG PO TABS: 1.0000 | ORAL_TABLET | Freq: Every day | ORAL | Status: DC

## 2015-07-17 NOTE — Telephone Encounter (Signed)
Pt called stating all her [facility] [address]  She is needing refills on gabapentin (NEURONTIN) 100 MG capsule [id number]  And the ibuprofen Also the prescriptions for loratadine (CLARITIN) 10 MG tablet [id number] and losartan-hydrochlorothiazide (HYZAAR) 100-25 MG tablet [id number] [facility]

## 2015-07-17 NOTE — Telephone Encounter (Signed)
Done. See meds. Pt informed  

## 2015-07-29 ENCOUNTER — Ambulatory Visit (HOSPITAL_BASED_OUTPATIENT_CLINIC_OR_DEPARTMENT_OTHER): Payer: Medicare Other | Admitting: Hematology

## 2015-07-29 ENCOUNTER — Other Ambulatory Visit (HOSPITAL_BASED_OUTPATIENT_CLINIC_OR_DEPARTMENT_OTHER): Payer: Medicare Other

## 2015-07-29 ENCOUNTER — Telehealth: Payer: Self-pay | Admitting: Hematology

## 2015-07-29 ENCOUNTER — Encounter: Payer: Self-pay | Admitting: Hematology

## 2015-07-29 VITALS — BP 137/58 | HR 88 | Temp 98.5°F | Resp 17 | Ht 65.0 in | Wt 159.0 lb

## 2015-07-29 DIAGNOSIS — M1990 Unspecified osteoarthritis, unspecified site: Secondary | ICD-10-CM | POA: Diagnosis not present

## 2015-07-29 DIAGNOSIS — I10 Essential (primary) hypertension: Secondary | ICD-10-CM

## 2015-07-29 DIAGNOSIS — M549 Dorsalgia, unspecified: Secondary | ICD-10-CM

## 2015-07-29 DIAGNOSIS — D471 Chronic myeloproliferative disease: Secondary | ICD-10-CM | POA: Diagnosis not present

## 2015-07-29 DIAGNOSIS — D473 Essential (hemorrhagic) thrombocythemia: Secondary | ICD-10-CM

## 2015-07-29 LAB — CBC WITH DIFFERENTIAL/PLATELET
BASO%: 0.7 % (ref 0.0–2.0)
BASOS ABS: 0 10*3/uL (ref 0.0–0.1)
EOS ABS: 0 10*3/uL (ref 0.0–0.5)
EOS%: 0.5 % (ref 0.0–7.0)
HCT: 31.1 % — ABNORMAL LOW (ref 34.8–46.6)
HEMOGLOBIN: 10.4 g/dL — AB (ref 11.6–15.9)
LYMPH#: 1.7 10*3/uL (ref 0.9–3.3)
LYMPH%: 25.3 % (ref 14.0–49.7)
MCH: 35.8 pg — AB (ref 25.1–34.0)
MCHC: 33.4 g/dL (ref 31.5–36.0)
MCV: 107 fL — AB (ref 79.5–101.0)
MONO#: 0.6 10*3/uL (ref 0.1–0.9)
MONO%: 9 % (ref 0.0–14.0)
NEUT%: 64.5 % (ref 38.4–76.8)
NEUTROS ABS: 4.4 10*3/uL (ref 1.5–6.5)
Platelets: 162 10*3/uL (ref 145–400)
RBC: 2.9 10*6/uL — ABNORMAL LOW (ref 3.70–5.45)
RDW: 12.4 % (ref 11.2–14.5)
WBC: 6.9 10*3/uL (ref 3.9–10.3)

## 2015-07-29 MED ORDER — ANAGRELIDE HCL 0.5 MG PO CAPS: 0.5000 mg | ORAL_CAPSULE | Freq: Two times a day (BID) | ORAL | Status: DC

## 2015-07-29 NOTE — Telephone Encounter (Signed)
Gave patient avs report and appointments for February.  °

## 2015-07-29 NOTE — Progress Notes (Signed)
[place] OFFICE PROGRESS NOTE  [name], MD [place] [address]  DIAGNOSIS: Essential thrombocythemia   MPN (myeloproliferative neoplasm)  PROBLEM LIST: 1. Essential thrombocythemia with thrombocytosis going back to 2000. Mutations for JAK2 V617F, Exon 12, and MPL515 were negative. The patient had a bone marrow on 11/14/2009 which showed slightly hypercellular marrow for the patient's age, along with abundant iron stores. There was minimal reticulin fibrosis. Trichrome stains failed to show any definite collagenous fibrosis. The patient is maintained on hydroxyurea and aspirin since mid June 2011.  2. Right retinal artery occlusion and subsequent retinal infarct on 05/22/2009. The patient's vision has returned to near normal.  3. Peripheral vascular disease involving the right carotid artery detected 08/05/2009.  4. Hypertension  5. Allergic rhinitis  6. History of asthma  7. Adenomatous colonic polyps.  8. Diverticulosis  Chief complaint: Follow-up  CURRENT THERAPY:  1.Hydrea 500 mg  Every day except 1,000 mg on M and Thurs. Decreased to 500mg  daily on 06/10/2015, and further decreased to 500mg  daily except M andThursday due to anemia, stopped on 07/30/2015 2. Added anagrelide 0.5 mg twice daily on 06/10/2015  3. Aspirin 325 mg daily.   INTERVAL HISTORY: Paula Griffith 70 y.o. female with presumed essential thrombocythemia currently maintained on aspirin and hydroxyurea and anagrelide is here for follow-up.  She was last seen by me 1 month ago and her medication was adjusted on last visit.  She is clinically doing very well, remains to be very physically active. She has compliant with her medications. No pain, dyspnea, bleeding or other complains. She has good appetite and weight is stable.   MEDICAL HISTORY: Past Medical History  Diagnosis Date  . Colon polyps   . Diverticulosis of colon   . HTN (hypertension)   . LBP (low back pain)   .  Osteoarthritis   . Asthma   . Allergic rhinitis   . Glucose intolerance (impaired glucose tolerance)   . PVD (peripheral vascular disease) ([place]) 2011    Right Carotid Dr Scot Dock  . 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 Thrombocytopathy [facility]) 2011    Dr Ralene Ok      ALLERGIES:  is allergic to benazepril; codeine; and tramadol.  MEDICATIONS: has a current medication list which includes the following prescription(s): albuterol, amlodipine, anagrelide, aspirin, biotin, cholecalciferol, gabapentin, hydroxyurea, ibuprofen, loratadine, losartan-hydrochlorothiazide, meloxicam, multivitamin, potassium chloride, promethazine, symbicort, and cyclobenzaprine.  SURGICAL HISTORY:  Past Surgical History  Procedure Laterality Date  . Abdominal hysterectomy  1989    REVIEW OF SYSTEMS:   Constitutional: Denies fevers, chills or abnormal weight loss Eyes: Denies blurriness of vision Ears, nose, mouth, throat, and face: Denies mucositis or sore throat Respiratory: Denies cough, dyspnea or wheezes Cardiovascular: Denies palpitation, chest discomfort or lower extremity swelling Gastrointestinal:  Denies nausea, heartburn or change in bowel habits Skin: Denies abnormal skin rashes Lymphatics: Denies new lymphadenopathy or easy bruising Neurological:Denies numbness, tingling or new weaknesses Behavioral/Psych: Mood is stable, no new changes  All other systems were reviewed with the patient and are negative.  PHYSICAL EXAMINATION: ECOG PERFORMANCE STATUS: 0 - Asymptomatic  Blood pressure 137/58, pulse 88, temperature 98.5 F (36.9 C), temperature source Oral, resp. rate 17, height 5\' 5"  (1.651 m), weight 159 lb (72.122 kg), SpO2 100 %.  GENERAL:alert, no distress and comfortable; well developed and well nourished. SKIN: skin color, texture, turgor are normal, no rashes or significant lesions EYES: normal, Conjunctiva are pink and non-injected, sclera clear  OROPHARYNX:no exudate, no erythema and  lips, buccal mucosa, and tongue normal  NECK: supple, thyroid normal size, non-tender, without nodularity LYMPH:  no palpable lymphadenopathy in the cervical, axillary or supraclavicular LUNGS: clear to auscultation and percussion with normal breathing effort HEART: regular rate & rhythm and no murmurs and no lower extremity edema ABDOMEN:abdomen soft, non-tender and normal bowel sounds Musculoskeletal:no cyanosis of digits and no clubbing ; R ankle larger than Left with slight tenderness to palpation.  NEURO: alert & oriented x 3 with fluent speech, no focal motor/sensory deficits   LABORATORY DATA: CBC Latest Ref Rng 07/29/2015 07/11/2015 06/27/2015  WBC 3.9 - 10.3 10e3/uL 6.9 5.8 10.1  Hemoglobin 11.6 - 15.9 g/dL 10.4(L) 10.9(L) 10.5(L)  Hematocrit 34.8 - 46.6 % 31.1(L) 31.7(L) 30.3(L)  Platelets 145 - 400 10e3/uL 162 420(H) 138(L)    CMP Latest Ref Rng 07/11/2015 06/10/2015 03/21/2015  Glucose 70 - 140 mg/dl 142(H) 144(H) 135(H)  BUN 7.0 - 26.0 mg/dL 16.0 12.5 14  Creatinine 0.6 - 1.1 mg/dL 1.1 1.0 1.06  Sodium 136 - 145 mEq/L 142 140 144  Potassium 3.5 - 5.1 mEq/L 3.7 3.7 3.5  Chloride 96 - 112 mEq/L - - 102  CO2 22 - 29 mEq/L 27 27 33(H)  Calcium 8.4 - 10.4 mg/dL 9.5 9.6 9.8  Total Protein 6.4 - 8.3 g/dL 7.4 7.3 7.0  Total Bilirubin 0.20 - 1.20 mg/dL 0.31 0.49 0.3  Alkaline Phos 40 - 150 U/L 67 61 49  AST 5 - 34 U/L 16 19 20   ALT 0 - 55 U/L <9 <9 9      RADIOGRAPHIC STUDIES: No results found.  ASSESSMENT: Paula DELCOUR 70 y.o. female with a history of Essential thrombocythemia    1. Myeloproliferative neoplasm-essential thrombocythemia, JAK2 mutation negative.  -We reviewed the natural history of ET, most patients do well well, major complication is thrombosis. -she has persistent anemia, despite decreasing hydrea dosage. He Hb is 10.4, Plt well controlled at 162K. I recommend her to stop hydea, and continue anagrelide 0.5mg  twice daily  -CBC in 2 weeks, I'll see her  back in 1 week  2. Arthritis and back pain  -She will continue follow-up with her  PCP and pain management clinic. -Her pain is mild, and she has no limitation on her activities   3. Hypertension and other medical problems She'll continue follow-up with her primary care physician  Plan -stop Hydrea   -continue anagrelide 0.5 mg twice daily -Repeat a CBC in 2 weeks and I'll see her back in 4 weeks.  All questions were answered. The patient knows to call the clinic with any problems, questions or concerns. We can certainly see the patient much sooner if necessary.  I spent 15 minutes counseling the patient face to face. The total time spent in the appointment was 20 minutes.     Truitt Merle, MD 07/29/2015

## 2015-08-12 ENCOUNTER — Ambulatory Visit: Payer: Medicare Other | Admitting: Hematology

## 2015-08-12 ENCOUNTER — Other Ambulatory Visit (HOSPITAL_BASED_OUTPATIENT_CLINIC_OR_DEPARTMENT_OTHER): Payer: Medicare Other

## 2015-08-12 DIAGNOSIS — D471 Chronic myeloproliferative disease: Secondary | ICD-10-CM | POA: Diagnosis not present

## 2015-08-12 LAB — CBC WITH DIFFERENTIAL/PLATELET
BASO%: 0.6 % (ref 0.0–2.0)
BASOS ABS: 0 10*3/uL (ref 0.0–0.1)
EOS%: 1.3 % (ref 0.0–7.0)
Eosinophils Absolute: 0.1 10*3/uL (ref 0.0–0.5)
HEMATOCRIT: 29.6 % — AB (ref 34.8–46.6)
HEMOGLOBIN: 10.1 g/dL — AB (ref 11.6–15.9)
LYMPH#: 1.9 10*3/uL (ref 0.9–3.3)
LYMPH%: 29.9 % (ref 14.0–49.7)
MCH: 35.9 pg — ABNORMAL HIGH (ref 25.1–34.0)
MCHC: 34.3 g/dL (ref 31.5–36.0)
MCV: 104.6 fL — ABNORMAL HIGH (ref 79.5–101.0)
MONO#: 0.7 10*3/uL (ref 0.1–0.9)
MONO%: 10.6 % (ref 0.0–14.0)
NEUT#: 3.7 10*3/uL (ref 1.5–6.5)
NEUT%: 57.6 % (ref 38.4–76.8)
Platelets: 160 10*3/uL (ref 145–400)
RBC: 2.83 10*6/uL — ABNORMAL LOW (ref 3.70–5.45)
RDW: 12.1 % (ref 11.2–14.5)
WBC: 6.4 10*3/uL (ref 3.9–10.3)

## 2015-08-15 ENCOUNTER — Other Ambulatory Visit: Payer: Self-pay | Admitting: *Deleted

## 2015-08-15 ENCOUNTER — Telehealth: Payer: Self-pay | Admitting: *Deleted

## 2015-08-15 DIAGNOSIS — D471 Chronic myeloproliferative disease: Secondary | ICD-10-CM

## 2015-08-15 MED ORDER — ANAGRELIDE HCL 0.5 MG PO CAPS: 0.5000 mg | ORAL_CAPSULE | ORAL | Status: DC

## 2015-08-15 NOTE — Telephone Encounter (Signed)
Dr. Burr Medico reviewed lab results done 08/12/15.  [facility] [address] [facility] re:  Take Anagrelide 1 mg in AM , and 0.5 mg in PM - as per Dr. Ernestina Penna instructions.  Called pt at home and left message on voice mail of same above info.  Asked pt to call office back to confirm that pt received triage nurse message.

## 2015-08-20 ENCOUNTER — Telehealth: Payer: Self-pay

## 2015-08-20 NOTE — Telephone Encounter (Signed)
Recd faxed rx refill request for ibuprofen 600mg  [facility]----please advise, thanks----

## 2015-08-23 ENCOUNTER — Telehealth: Payer: Self-pay | Admitting: Hematology

## 2015-08-23 NOTE — Telephone Encounter (Signed)
Due to YF out moved 2/27 lab/fu to 3/6 @ 12:30pm. Left messaege for patient re new date/time.

## 2015-08-26 ENCOUNTER — Ambulatory Visit: Payer: Medicare Other | Admitting: Hematology

## 2015-08-26 ENCOUNTER — Other Ambulatory Visit: Payer: Medicare Other

## 2015-09-02 ENCOUNTER — Other Ambulatory Visit (HOSPITAL_BASED_OUTPATIENT_CLINIC_OR_DEPARTMENT_OTHER): Payer: Medicare Other

## 2015-09-02 ENCOUNTER — Telehealth: Payer: Self-pay | Admitting: Hematology

## 2015-09-02 ENCOUNTER — Ambulatory Visit (HOSPITAL_BASED_OUTPATIENT_CLINIC_OR_DEPARTMENT_OTHER): Payer: Medicare Other | Admitting: Hematology

## 2015-09-02 ENCOUNTER — Encounter: Payer: Self-pay | Admitting: Hematology

## 2015-09-02 VITALS — BP 134/64 | HR 98 | Temp 98.8°F | Resp 18 | Ht 65.0 in | Wt 158.9 lb

## 2015-09-02 DIAGNOSIS — D471 Chronic myeloproliferative disease: Secondary | ICD-10-CM

## 2015-09-02 DIAGNOSIS — M1990 Unspecified osteoarthritis, unspecified site: Secondary | ICD-10-CM

## 2015-09-02 DIAGNOSIS — D473 Essential (hemorrhagic) thrombocythemia: Secondary | ICD-10-CM

## 2015-09-02 DIAGNOSIS — I10 Essential (primary) hypertension: Secondary | ICD-10-CM

## 2015-09-02 DIAGNOSIS — M549 Dorsalgia, unspecified: Secondary | ICD-10-CM | POA: Diagnosis not present

## 2015-09-02 LAB — CBC WITH DIFFERENTIAL/PLATELET
BASO%: 0.6 % (ref 0.0–2.0)
BASOS ABS: 0 10*3/uL (ref 0.0–0.1)
EOS ABS: 0.1 10*3/uL (ref 0.0–0.5)
EOS%: 1.8 % (ref 0.0–7.0)
HEMATOCRIT: 28.8 % — AB (ref 34.8–46.6)
HGB: 10.2 g/dL — ABNORMAL LOW (ref 11.6–15.9)
LYMPH%: 22.8 % (ref 14.0–49.7)
MCH: 33.8 pg (ref 25.1–34.0)
MCHC: 35.4 g/dL (ref 31.5–36.0)
MCV: 95.4 fL (ref 79.5–101.0)
MONO#: 0.6 10*3/uL (ref 0.1–0.9)
MONO%: 8.5 % (ref 0.0–14.0)
NEUT%: 66.3 % (ref 38.4–76.8)
NEUTROS ABS: 4.4 10*3/uL (ref 1.5–6.5)
PLATELETS: 92 10*3/uL — AB (ref 145–400)
RBC: 3.02 10*6/uL — ABNORMAL LOW (ref 3.70–5.45)
RDW: 11.9 % (ref 11.2–14.5)
WBC: 6.7 10*3/uL (ref 3.9–10.3)
lymph#: 1.5 10*3/uL (ref 0.9–3.3)
nRBC: 0 % (ref 0–0)

## 2015-09-02 NOTE — Progress Notes (Signed)
[place] OFFICE PROGRESS NOTE  [name], MD [place] [address]  DIAGNOSIS: Essential thrombocythemia   MPN (myeloproliferative neoplasm)  PROBLEM LIST: 1. Essential thrombocythemia with thrombocytosis going back to 2000. Mutations for JAK2 V617F, Exon 12, and MPL515 were negative. The patient had a bone marrow on 11/14/2009 which showed slightly hypercellular marrow for the patient's age, along with abundant iron stores. There was minimal reticulin fibrosis. Trichrome stains failed to show any definite collagenous fibrosis. The patient is maintained on hydroxyurea and aspirin since mid June 2011.  2. Right retinal artery occlusion and subsequent retinal infarct on 05/22/2009. The patient's vision has returned to near normal.  3. Peripheral vascular disease involving the right carotid artery detected 08/05/2009.  4. Hypertension  5. Allergic rhinitis  6. History of asthma  7. Adenomatous colonic polyps.  8. Diverticulosis  Chief complaint: Follow-up  CURRENT THERAPY:  1.Hydrea 500 mg  Every day except 1,000 mg on M and Thurs. Decreased to 500mg  daily on 06/10/2015, and further decreased to 500mg  daily except M andThursday due to anemia, stopped on 07/30/2015 2. Added anagrelide 0.5 mg twice daily on 06/10/2015, increased to 1mg  am 0.5mg  pm on 07/30/2015, and decreased to 0.5mg  daily on 09/03/2015   3. Aspirin 325 mg daily.   INTERVAL HISTORY: Paula Griffith 70 y.o. female with presumed essential thrombocythemia currently maintained on aspirin and agrelide is here for follow-up.  She was last seen by me  About 6 weeks ago and her medication was adjusted on last visit.  She is clinically doing very well, denies any pain, dyspnea, bleeding or any other symptoms.  She is compliant with anagrelide , and tolerates very well. She is currently in a mast degree program,  Has been quite stressed by her study,  She plans to complete the program in 1  year.  MEDICAL HISTORY: Past Medical History  Diagnosis Date  . Colon polyps   . Diverticulosis of colon   . HTN (hypertension)   . LBP (low back pain)   . Osteoarthritis   . Asthma   . Allergic rhinitis   . Glucose intolerance (impaired glucose tolerance)   . PVD (peripheral vascular disease) ([place]) 2011    Right Carotid Dr Scot Dock  . 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 Thrombocytopathy [facility]) 2011    Dr Ralene Ok      ALLERGIES:  is allergic to benazepril; codeine; and tramadol.  MEDICATIONS: has a current medication list which includes the following prescription(s): albuterol, amlodipine, anagrelide, aspirin, biotin, cholecalciferol, cyclobenzaprine, gabapentin, ibuprofen, loratadine, losartan-hydrochlorothiazide, meloxicam, multivitamin, potassium chloride, promethazine, symbicort, and hydroxyurea.  SURGICAL HISTORY:  Past Surgical History  Procedure Laterality Date  . Abdominal hysterectomy  1989    REVIEW OF SYSTEMS:   Constitutional: Denies fevers, chills or abnormal weight loss Eyes: Denies blurriness of vision Ears, nose, mouth, throat, and face: Denies mucositis or sore throat Respiratory: Denies cough, dyspnea or wheezes Cardiovascular: Denies palpitation, chest discomfort or lower extremity swelling Gastrointestinal:  Denies nausea, heartburn or change in bowel habits Skin: Denies abnormal skin rashes Lymphatics: Denies new lymphadenopathy or easy bruising Neurological:Denies numbness, tingling or new weaknesses Behavioral/Psych: Mood is stable, no new changes  All other systems were reviewed with the patient and are negative.  PHYSICAL EXAMINATION: ECOG PERFORMANCE STATUS: 0 - Asymptomatic  Blood pressure 134/64, pulse 98, temperature 98.8 F (37.1 C), temperature source Oral, resp. rate 18, height 5\' 5"  (1.651 m), weight 158 lb 14.4 oz (72.077  kg), SpO2 100 %.  GENERAL:alert, no distress and comfortable; well developed and well nourished. SKIN: skin  color, texture, turgor are normal, no rashes or significant lesions EYES: normal, Conjunctiva are pink and non-injected, sclera clear OROPHARYNX:no exudate, no erythema and lips, buccal mucosa, and tongue normal  NECK: supple, thyroid normal size, non-tender, without nodularity LYMPH:  no palpable lymphadenopathy in the cervical, axillary or supraclavicular LUNGS: clear to auscultation and percussion with normal breathing effort HEART: regular rate & rhythm and no murmurs and no lower extremity edema ABDOMEN:abdomen soft, non-tender and normal bowel sounds Musculoskeletal:no cyanosis of digits and no clubbing ; R ankle larger than Left with slight tenderness to palpation.  NEURO: alert & oriented x 3 with fluent speech, no focal motor/sensory deficits   LABORATORY DATA: CBC Latest Ref Rng 09/02/2015 08/12/2015 07/29/2015  WBC 3.9 - 10.3 10e3/uL 6.7 6.4 6.9  Hemoglobin 11.6 - 15.9 g/dL 10.2(L) 10.1(L) 10.4(L)  Hematocrit 34.8 - 46.6 % 28.8(L) 29.6(L) 31.1(L)  Platelets 145 - 400 10e3/uL 92(L) 160 162    CMP Latest Ref Rng 07/11/2015 06/10/2015 03/21/2015  Glucose 70 - 140 mg/dl 142(H) 144(H) 135(H)  BUN 7.0 - 26.0 mg/dL 16.0 12.5 14  Creatinine 0.6 - 1.1 mg/dL 1.1 1.0 1.06  Sodium 136 - 145 mEq/L 142 140 144  Potassium 3.5 - 5.1 mEq/L 3.7 3.7 3.5  Chloride 96 - 112 mEq/L - - 102  CO2 22 - 29 mEq/L 27 27 33(H)  Calcium 8.4 - 10.4 mg/dL 9.5 9.6 9.8  Total Protein 6.4 - 8.3 g/dL 7.4 7.3 7.0  Total Bilirubin 0.20 - 1.20 mg/dL 0.31 0.49 0.3  Alkaline Phos 40 - 150 U/L 67 61 49  AST 5 - 34 U/L 16 19 20   ALT 0 - 55 U/L <9 <9 9      RADIOGRAPHIC STUDIES: No results found.  ASSESSMENT: Paula Griffith 70 y.o. female with a history of Essential thrombocythemia    1. Myeloproliferative neoplasm-essential thrombocythemia, JAK2 mutation negative.  -We reviewed the natural history of ET, most patients do well well, major complication is thrombosis. - she now developed thrombocytopenia,   Despite her Hydrea has been held about 6 weeks ago.  I'll decrease her anagrelide to 0.5 mg daily, and follow her CBC closely. I will stop  Anagrelide if she has persistent  Thrombocytopenia in the next few weeks. -if persistent  - may consider bone marrow  Biopsy if her thrombocytopenia does not resolve -CBC in 1 and 2 weeks, I'll see her back in 4 weeks   2. Arthritis and back pain  -She will continue follow-up with her  PCP and pain management clinic. -Her pain is mild, and she has no limitation on her activities   3. Hypertension and other medical problems She'll continue follow-up with her primary care physician  Plan -decrease anagrelide from 1.5mg /day to 0.5 mg daily -Repeat a CBC in 1 and 2 weeks and I'll see her back in 4 weeks. - she uses my chart to check her lab results, and we'll call me if she notice abnormal platelet count   All questions were answered. The patient knows to call the clinic with any problems, questions or concerns. We can certainly see the patient much sooner if necessary.  I spent 15 minutes counseling the patient face to face. The total time spent in the appointment was 20 minutes.     Truitt Merle, MD 09/02/2015

## 2015-09-02 NOTE — Telephone Encounter (Signed)
Gave patient avs report and appointments for March and February.

## 2015-09-09 ENCOUNTER — Other Ambulatory Visit (HOSPITAL_BASED_OUTPATIENT_CLINIC_OR_DEPARTMENT_OTHER): Payer: Medicare Other

## 2015-09-09 DIAGNOSIS — D471 Chronic myeloproliferative disease: Secondary | ICD-10-CM | POA: Diagnosis not present

## 2015-09-09 LAB — CBC WITH DIFFERENTIAL/PLATELET
BASO%: 0.8 % (ref 0.0–2.0)
Basophils Absolute: 0.1 10*3/uL (ref 0.0–0.1)
EOS ABS: 0.1 10*3/uL (ref 0.0–0.5)
EOS%: 0.9 % (ref 0.0–7.0)
HCT: 30.6 % — ABNORMAL LOW (ref 34.8–46.6)
HGB: 10.6 g/dL — ABNORMAL LOW (ref 11.6–15.9)
LYMPH%: 30.4 % (ref 14.0–49.7)
MCH: 33.4 pg (ref 25.1–34.0)
MCHC: 34.6 g/dL (ref 31.5–36.0)
MCV: 96.5 fL (ref 79.5–101.0)
MONO#: 0.6 10*3/uL (ref 0.1–0.9)
MONO%: 9.5 % (ref 0.0–14.0)
NEUT#: 3.7 10*3/uL (ref 1.5–6.5)
NEUT%: 58.4 % (ref 38.4–76.8)
PLATELETS: 363 10*3/uL (ref 145–400)
RBC: 3.17 10*6/uL — AB (ref 3.70–5.45)
RDW: 12 % (ref 11.2–14.5)
WBC: 6.4 10*3/uL (ref 3.9–10.3)
lymph#: 2 10*3/uL (ref 0.9–3.3)

## 2015-09-10 ENCOUNTER — Other Ambulatory Visit: Payer: Self-pay | Admitting: Internal Medicine

## 2015-09-11 ENCOUNTER — Other Ambulatory Visit: Payer: Self-pay | Admitting: Internal Medicine

## 2015-09-16 ENCOUNTER — Other Ambulatory Visit (HOSPITAL_BASED_OUTPATIENT_CLINIC_OR_DEPARTMENT_OTHER): Payer: Medicare Other

## 2015-09-16 DIAGNOSIS — D471 Chronic myeloproliferative disease: Secondary | ICD-10-CM | POA: Diagnosis not present

## 2015-09-16 LAB — CBC WITH DIFFERENTIAL/PLATELET
BASO%: 0.4 % (ref 0.0–2.0)
Basophils Absolute: 0 10*3/uL (ref 0.0–0.1)
EOS ABS: 0.1 10*3/uL (ref 0.0–0.5)
EOS%: 2.2 % (ref 0.0–7.0)
HEMATOCRIT: 29.9 % — AB (ref 34.8–46.6)
HEMOGLOBIN: 10.3 g/dL — AB (ref 11.6–15.9)
LYMPH%: 36.7 % (ref 14.0–49.7)
MCH: 32.8 pg (ref 25.1–34.0)
MCHC: 34.4 g/dL (ref 31.5–36.0)
MCV: 95.2 fL (ref 79.5–101.0)
MONO#: 0.5 10*3/uL (ref 0.1–0.9)
MONO%: 10.3 % (ref 0.0–14.0)
NEUT%: 50.4 % (ref 38.4–76.8)
NEUTROS ABS: 2.5 10*3/uL (ref 1.5–6.5)
PLATELETS: 431 10*3/uL — AB (ref 145–400)
RBC: 3.14 10*6/uL — AB (ref 3.70–5.45)
RDW: 12.1 % (ref 11.2–14.5)
WBC: 5 10*3/uL (ref 3.9–10.3)
lymph#: 1.9 10*3/uL (ref 0.9–3.3)

## 2015-09-30 ENCOUNTER — Other Ambulatory Visit: Payer: Self-pay | Admitting: *Deleted

## 2015-09-30 ENCOUNTER — Telehealth: Payer: Self-pay | Admitting: Hematology

## 2015-09-30 ENCOUNTER — Encounter: Payer: Self-pay | Admitting: Hematology

## 2015-09-30 ENCOUNTER — Ambulatory Visit (HOSPITAL_BASED_OUTPATIENT_CLINIC_OR_DEPARTMENT_OTHER): Payer: Medicare Other

## 2015-09-30 ENCOUNTER — Ambulatory Visit (HOSPITAL_BASED_OUTPATIENT_CLINIC_OR_DEPARTMENT_OTHER): Payer: Medicare Other | Admitting: Hematology

## 2015-09-30 VITALS — BP 125/60 | HR 97 | Temp 98.8°F | Resp 18 | Ht 65.0 in | Wt 155.6 lb

## 2015-09-30 DIAGNOSIS — D473 Essential (hemorrhagic) thrombocythemia: Secondary | ICD-10-CM

## 2015-09-30 DIAGNOSIS — D471 Chronic myeloproliferative disease: Secondary | ICD-10-CM

## 2015-09-30 DIAGNOSIS — M549 Dorsalgia, unspecified: Secondary | ICD-10-CM | POA: Diagnosis not present

## 2015-09-30 DIAGNOSIS — M1990 Unspecified osteoarthritis, unspecified site: Secondary | ICD-10-CM | POA: Diagnosis not present

## 2015-09-30 DIAGNOSIS — D759 Disease of blood and blood-forming organs, unspecified: Secondary | ICD-10-CM

## 2015-09-30 LAB — CBC WITH DIFFERENTIAL/PLATELET
BASO%: 0.5 % (ref 0.0–2.0)
BASOS ABS: 0 10*3/uL (ref 0.0–0.1)
EOS ABS: 0.1 10*3/uL (ref 0.0–0.5)
EOS%: 1.5 % (ref 0.0–7.0)
HCT: 29.8 % — ABNORMAL LOW (ref 34.8–46.6)
HEMOGLOBIN: 10.4 g/dL — AB (ref 11.6–15.9)
LYMPH%: 20.5 % (ref 14.0–49.7)
MCH: 32.8 pg (ref 25.1–34.0)
MCHC: 34.9 g/dL (ref 31.5–36.0)
MCV: 94 fL (ref 79.5–101.0)
MONO#: 0.5 10*3/uL (ref 0.1–0.9)
MONO%: 7.9 % (ref 0.0–14.0)
NEUT#: 4.6 10*3/uL (ref 1.5–6.5)
NEUT%: 69.6 % (ref 38.4–76.8)
Platelets: 354 10*3/uL (ref 145–400)
RBC: 3.17 10*6/uL — ABNORMAL LOW (ref 3.70–5.45)
RDW: 12 % (ref 11.2–14.5)
WBC: 6.6 10*3/uL (ref 3.9–10.3)
lymph#: 1.4 10*3/uL (ref 0.9–3.3)

## 2015-09-30 NOTE — Progress Notes (Signed)
[place] OFFICE PROGRESS NOTE  [name], MD [place] [address]  DIAGNOSIS: Essential thrombocythemia   MPN (myeloproliferative neoplasm)  PROBLEM LIST: 1. Essential thrombocythemia with thrombocytosis going back to 2000. Mutations for JAK2 V617F, Exon 12, and MPL515 were negative. The patient had a bone marrow on 11/14/2009 which showed slightly hypercellular marrow for the patient's age, along with abundant iron stores. There was minimal reticulin fibrosis. Trichrome stains failed to show any definite collagenous fibrosis. The patient is maintained on hydroxyurea and aspirin since mid June 2011.  2. Right retinal artery occlusion and subsequent retinal infarct on 05/22/2009. The patient's vision has returned to near normal.  3. Peripheral vascular disease involving the right carotid artery detected 08/05/2009.  4. Hypertension  5. Allergic rhinitis  6. History of asthma  7. Adenomatous colonic polyps.  8. Diverticulosis  Chief complaint: Follow-up  CURRENT THERAPY:  1.Hydrea 500 mg  Every day except 1,000 mg on M and Thurs. Decreased to 500mg  daily on 06/10/2015, and further decreased to 500mg  daily except M andThursday due to anemia, stopped on 07/30/2015 2. Added anagrelide 0.5 mg twice daily on 06/10/2015, increased to 1mg  am 0.5mg  pm on 07/30/2015, and decreased to 0.5mg  daily on 09/03/2015   3. Aspirin 325 mg daily.   INTERVAL HISTORY: Paula Griffith 70 y.o. female with presumed essential thrombocythemia currently maintained on aspirin and agrelide is here for follow-up.  She was last seen by me  About 4 weeks ago and her medication was adjusted on last visit.  She is clinically doing very well, denies any pain, leg swelling, dyspnea or other symptoms. She has been distressed due to her sister's illness, and her own school work. She is otherwise feels well, no other new symptoms. She has been compliant with anagrelide once daily, tolerating well  without any noticeable side effects.  MEDICAL HISTORY: Past Medical History  Diagnosis Date  . Colon polyps   . Diverticulosis of colon   . HTN (hypertension)   . LBP (low back pain)   . Osteoarthritis   . Asthma   . Allergic rhinitis   . Glucose intolerance (impaired glucose tolerance)   . PVD (peripheral vascular disease) ([place]) 2011    Right Carotid Dr Scot Dock  . 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 Thrombocytopathy [facility]) 2011    Dr Ralene Ok      ALLERGIES:  is allergic to benazepril; codeine; and tramadol.  MEDICATIONS: has a current medication list which includes the following prescription(s): albuterol, amlodipine, anagrelide, aspirin, biotin, cholecalciferol, cyclobenzaprine, gabapentin, ibuprofen, loratadine, losartan-hydrochlorothiazide, multivitamin, potassium chloride, symbicort, meloxicam, and promethazine.  SURGICAL HISTORY:  Past Surgical History  Procedure Laterality Date  . Abdominal hysterectomy  1989    REVIEW OF SYSTEMS:   Constitutional: Denies fevers, chills or abnormal weight loss Eyes: Denies blurriness of vision Ears, nose, mouth, throat, and face: Denies mucositis or sore throat Respiratory: Denies cough, dyspnea or wheezes Cardiovascular: Denies palpitation, chest discomfort or lower extremity swelling Gastrointestinal:  Denies nausea, heartburn or change in bowel habits Skin: Denies abnormal skin rashes Lymphatics: Denies new lymphadenopathy or easy bruising Neurological:Denies numbness, tingling or new weaknesses Behavioral/Psych: Mood is stable, no new changes  All other systems were reviewed with the patient and are negative.  PHYSICAL EXAMINATION: ECOG PERFORMANCE STATUS: 0 - Asymptomatic  Blood pressure 125/60, pulse 97, temperature 98.8 F (37.1 C), temperature source Oral, resp. rate 18, height 5\' 5"  (1.651 m), weight 155 lb 9.6 oz (70.58  kg), SpO2 100 %.  GENERAL:alert, no distress and comfortable; well developed and well  nourished. SKIN: skin color, texture, turgor are normal, no rashes or significant lesions EYES: normal, Conjunctiva are pink and non-injected, sclera clear OROPHARYNX:no exudate, no erythema and lips, buccal mucosa, and tongue normal  NECK: supple, thyroid normal size, non-tender, without nodularity LYMPH:  no palpable lymphadenopathy in the cervical, axillary or supraclavicular LUNGS: clear to auscultation and percussion with normal breathing effort HEART: regular rate & rhythm and no murmurs and no lower extremity edema ABDOMEN:abdomen soft, non-tender and normal bowel sounds Musculoskeletal:no cyanosis of digits and no clubbing ; R ankle larger than Left with slight tenderness to palpation.  NEURO: alert & oriented x 3 with fluent speech, no focal motor/sensory deficits   LABORATORY DATA: CBC Latest Ref Rng 09/30/2015 09/16/2015 09/09/2015  WBC 3.9 - 10.3 10e3/uL 6.6 5.0 6.4  Hemoglobin 11.6 - 15.9 g/dL 10.4(L) 10.3(L) 10.6(L)  Hematocrit 34.8 - 46.6 % 29.8(L) 29.9(L) 30.6(L)  Platelets 145 - 400 10e3/uL 354 431(H) 363    CMP Latest Ref Rng 07/11/2015 06/10/2015 03/21/2015  Glucose 70 - 140 mg/dl 142(H) 144(H) 135(H)  BUN 7.0 - 26.0 mg/dL 16.0 12.5 14  Creatinine 0.6 - 1.1 mg/dL 1.1 1.0 1.06  Sodium 136 - 145 mEq/L 142 140 144  Potassium 3.5 - 5.1 mEq/L 3.7 3.7 3.5  Chloride 96 - 112 mEq/L - - 102  CO2 22 - 29 mEq/L 27 27 33(H)  Calcium 8.4 - 10.4 mg/dL 9.5 9.6 9.8  Total Protein 6.4 - 8.3 g/dL 7.4 7.3 7.0  Total Bilirubin 0.20 - 1.20 mg/dL 0.31 0.49 0.3  Alkaline Phos 40 - 150 U/L 67 61 49  AST 5 - 34 U/L 16 19 20   ALT 0 - 55 U/L <9 <9 9      RADIOGRAPHIC STUDIES: No results found.  ASSESSMENT: Paula Griffith 70 y.o. female with a history of Essential thrombocythemia    1. Myeloproliferative neoplasm-essential thrombocythemia, JAK2 mutation negative.  -We reviewed the natural history of ET, most patients do well well, major complication is thrombosis. -She was on Hydrea  before, I stopped due to her anemia -Her anagrelide dose has been titrated, she is currently on 2.5 mg daily, platelet counts has been well-controlled, 354K today, we'll continue at the current dose. -I'll monitor her CBC monthly for the next 3 months  2. Arthritis and back pain  -She will continue follow-up with her  PCP and pain management clinic. -Her pain is mild, and she has no limitation on her activities   3. Hypertension and other medical problems She'll continue follow-up with her primary care physician  Plan -continue anagrelide 0.5 mg daily -Repeat a CBC every 4 weeks, I'll see her back in 3 months - she uses my chart to check her lab results, and we'll call me if she notice abnormal platelet count   All questions were answered. The patient knows to call the clinic with any problems, questions or concerns. We can certainly see the patient much sooner if necessary.  I spent 15 minutes counseling the patient face to face. The total time spent in the appointment was 20 minutes.     Truitt Merle, MD 09/30/2015

## 2015-09-30 NOTE — Telephone Encounter (Signed)
Gave patient avs report and appointments for May/June.  °

## 2015-10-12 ENCOUNTER — Other Ambulatory Visit: Payer: Self-pay | Admitting: Internal Medicine

## 2015-10-17 ENCOUNTER — Other Ambulatory Visit: Payer: Self-pay | Admitting: Internal Medicine

## 2015-10-25 ENCOUNTER — Ambulatory Visit (INDEPENDENT_AMBULATORY_CARE_PROVIDER_SITE_OTHER): Payer: Medicare Other | Admitting: Internal Medicine

## 2015-10-25 ENCOUNTER — Encounter: Payer: Self-pay | Admitting: Internal Medicine

## 2015-10-25 ENCOUNTER — Other Ambulatory Visit (INDEPENDENT_AMBULATORY_CARE_PROVIDER_SITE_OTHER): Payer: Medicare Other

## 2015-10-25 VITALS — BP 110/60 | HR 89 | Wt 154.0 lb

## 2015-10-25 DIAGNOSIS — I739 Peripheral vascular disease, unspecified: Secondary | ICD-10-CM | POA: Diagnosis not present

## 2015-10-25 DIAGNOSIS — R739 Hyperglycemia, unspecified: Secondary | ICD-10-CM | POA: Diagnosis not present

## 2015-10-25 DIAGNOSIS — D471 Chronic myeloproliferative disease: Secondary | ICD-10-CM

## 2015-10-25 DIAGNOSIS — J4520 Mild intermittent asthma, uncomplicated: Secondary | ICD-10-CM | POA: Diagnosis not present

## 2015-10-25 DIAGNOSIS — I10 Essential (primary) hypertension: Secondary | ICD-10-CM

## 2015-10-25 LAB — URINALYSIS, ROUTINE W REFLEX MICROSCOPIC
Bilirubin Urine: NEGATIVE
HGB URINE DIPSTICK: NEGATIVE
Ketones, ur: NEGATIVE
Nitrite: NEGATIVE
RBC / HPF: NONE SEEN (ref 0–?)
Specific Gravity, Urine: 1.015 (ref 1.000–1.030)
TOTAL PROTEIN, URINE-UPE24: NEGATIVE
UROBILINOGEN UA: 0.2 (ref 0.0–1.0)
Urine Glucose: NEGATIVE
pH: 6.5 (ref 5.0–8.0)

## 2015-10-25 LAB — BASIC METABOLIC PANEL
BUN: 14 mg/dL (ref 6–23)
CO2: 28 mEq/L (ref 19–32)
Calcium: 10 mg/dL (ref 8.4–10.5)
Chloride: 98 mEq/L (ref 96–112)
Creatinine, Ser: 0.89 mg/dL (ref 0.40–1.20)
GFR: 80.63 mL/min (ref >60.00)
GLUCOSE: 118 mg/dL — AB (ref 70–99)
POTASSIUM: 4.1 meq/L (ref 3.5–5.1)
SODIUM: 134 meq/L — AB (ref 135–145)

## 2015-10-25 LAB — LIPID PANEL
CHOL/HDL RATIO: 4
Cholesterol: 133 mg/dL (ref 0–200)
HDL: 35.7 mg/dL — ABNORMAL LOW (ref >39.00)
LDL CALC: 77 mg/dL (ref 0–99)
NonHDL: 97.25
TRIGLYCERIDES: 103 mg/dL (ref 0.0–149.0)
VLDL: 20.6 mg/dL (ref 0.0–40.0)

## 2015-10-25 LAB — HEPATIC FUNCTION PANEL
ALK PHOS: 46 U/L (ref 39–117)
ALT: 6 U/L (ref 0–35)
AST: 15 U/L (ref 0–37)
Albumin: 4.1 g/dL (ref 3.5–5.2)
BILIRUBIN DIRECT: 0.1 mg/dL (ref 0.0–0.3)
TOTAL PROTEIN: 7.1 g/dL (ref 6.0–8.3)
Total Bilirubin: 0.4 mg/dL (ref 0.2–1.2)

## 2015-10-25 LAB — TSH: TSH: 1.24 u[IU]/mL (ref 0.35–4.50)

## 2015-10-25 LAB — HEMOGLOBIN A1C: Hgb A1c MFr Bld: 6.1 % (ref 4.6–6.5)

## 2015-10-25 NOTE — Assessment & Plan Note (Signed)
On Symbicord 

## 2015-10-25 NOTE — Assessment & Plan Note (Signed)
Losartan HCT, Amlodipine 

## 2015-10-25 NOTE — Assessment & Plan Note (Signed)
CBC 3 wks ago

## 2015-10-25 NOTE — Assessment & Plan Note (Signed)
ASA

## 2015-10-25 NOTE — Progress Notes (Signed)
Pre visit review using our clinic review tool, if applicable. No additional management support is needed unless otherwise documented below in the visit note. 

## 2015-10-25 NOTE — Progress Notes (Signed)
Subjective:  Patient ID: Paula Griffith, female    DOB: 1946-06-23  Age: 70 y.o. MRN: [id number]  CC: No chief complaint on file.   HPI Paula Griffith presents for HTN, asthma, OA f/u  Outpatient Prescriptions Prior to Visit  Medication Sig Dispense Refill  . albuterol (PROAIR HFA) 108 (90 BASE) MCG/ACT inhaler Inhale 2 puffs into the lungs every 4 (four) hours as needed. For shortness of breath 1 Inhaler 3  . amLODipine (NORVASC) 5 MG tablet TAKE ONE TABLET BY MOUTH ONCE DAILY 30 tablet 11  . anagrelide (AGRYLIN) 0.5 MG capsule Take 1 capsule (0.5 mg total) by mouth as directed. Take 2 capsules ( 1 mg ) in  AM.   and       1 capsule  ( 0.5 mg ) in  PM  ;  And  As  Directed. (Patient taking differently: Take 0.5 mg by mouth daily. Take 1 capsules ( 1 mg ) in  AM.) 90 capsule 2  . aspirin 325 MG EC tablet Take 325 mg by mouth daily after breakfast.      . Biotin (BIOTIN MAXIMUM STRENGTH) 10 MG TABS Take by mouth daily.    . Cholecalciferol 1000 UNITS tablet Take 1,000 Units by mouth daily.      . cyclobenzaprine (FLEXERIL) 10 MG tablet Take 10 mg by mouth 2 (two) times daily. Reported on 07/29/2015    . gabapentin (NEURONTIN) 100 MG capsule TAKE 1 CAPSULE (100 MG TOTAL) BY MOUTH 3 (THREE) TIMES DAILY. 90 capsule 0  . ibuprofen (ADVIL,MOTRIN) 600 MG tablet TAKE 1 TABLET (600 MG TOTAL) BY MOUTH 2 (TWO) TIMES DAILY AS NEEDED. 60 tablet 0  . loratadine (CLARITIN) 10 MG tablet Take 1 tablet (10 mg total) by mouth daily. 90 tablet 1  . losartan-hydrochlorothiazide (HYZAAR) 100-25 MG tablet Take 1 tablet by mouth daily. 90 tablet 1  . meloxicam (MOBIC) 7.5 MG tablet Take 7.5 mg by mouth 2 (two) times daily. Reported on 09/30/2015    . Multiple Vitamin (MULTIVITAMIN) capsule Take 2 capsules by mouth daily.     . potassium chloride (K-DUR) 10 MEQ tablet TAKE ONE TABLET BY MOUTH ONCE DAILY 90 tablet 2  . promethazine (PHENERGAN) 12.5 MG tablet Take 1-2 tablets (12.5-25 mg total) by mouth every 6 (six)  hours as needed for nausea or vomiting. 60 tablet 0  . SYMBICORT 80-4.5 MCG/ACT inhaler INHALE TWO PUFFS INTO LUNGS TWICE DAILY 10.2 g 1   No facility-administered medications prior to visit.    ROS Review of Systems  Constitutional: Negative for chills, activity change, appetite change, fatigue and unexpected weight change.  HENT: Negative for congestion, mouth sores and sinus pressure.   Eyes: Negative for visual disturbance.  Respiratory: Negative for cough and chest tightness.   Gastrointestinal: Negative for nausea and abdominal pain.  Genitourinary: Negative for frequency, difficulty urinating and vaginal pain.  Musculoskeletal: Positive for back pain and arthralgias. Negative for gait problem.  Skin: Negative for pallor and rash.  Neurological: Negative for dizziness, tremors, weakness, numbness and headaches.  Psychiatric/Behavioral: Negative for suicidal ideas, confusion and sleep disturbance. The patient is not nervous/anxious.     Objective:  BP 110/60 mmHg  Pulse 89  Wt 154 lb (69.854 kg)  SpO2 96%  BP Readings from Last 3 Encounters:  10/25/15 110/60  09/30/15 125/60  09/02/15 134/64    Wt Readings from Last 3 Encounters:  10/25/15 154 lb (69.854 kg)  09/30/15 155 lb 9.6 oz (70.58 kg)  09/02/15 158 lb 14.4 oz (72.077 kg)    Physical Exam  Constitutional: She appears well-developed. No distress.  HENT:  Head: Normocephalic.  Right Ear: External ear normal.  Left Ear: External ear normal.  Nose: Nose normal.  Mouth/Throat: Oropharynx is clear and moist.  Eyes: Conjunctivae are normal. Pupils are equal, round, and reactive to light. Right eye exhibits no discharge. Left eye exhibits no discharge.  Neck: Normal range of motion. Neck supple. No JVD present. No tracheal deviation present. No thyromegaly present.  Cardiovascular: Normal rate, regular rhythm and normal heart sounds.   Pulmonary/Chest: No stridor. No respiratory distress. She has no wheezes.    Abdominal: Soft. Bowel sounds are normal. She exhibits no distension and no mass. There is no tenderness. There is no rebound and no guarding.  Musculoskeletal: She exhibits tenderness. She exhibits no edema.  Lymphadenopathy:    She has no cervical adenopathy.  Neurological: She displays normal reflexes. No cranial nerve deficit. She exhibits normal muscle tone. Coordination normal.  Skin: No rash noted. No erythema.  Psychiatric: She has a normal mood and affect. Her behavior is normal. Judgment and thought content normal.  looks well  Lab Results  Component Value Date   WBC 6.6 09/30/2015   HGB 10.4* 09/30/2015   HCT 29.8* 09/30/2015   PLT 354 09/30/2015   GLUCOSE 142* 07/11/2015   CHOL 145 04/22/2012   TRIG 105.0 04/22/2012   HDL 36.50* 04/22/2012   LDLCALC 88 04/22/2012   ALT <9 07/11/2015   AST 16 07/11/2015   NA 142 07/11/2015   K 3.7 07/11/2015   CL 102 03/21/2015   CREATININE 1.1 07/11/2015   BUN 16.0 07/11/2015   CO2 27 07/11/2015   TSH 0.59 04/22/2012   HGBA1C 6.4 03/22/2015    No results found.  Assessment & Plan:   There are no diagnoses linked to this encounter. I am having Ms. Carron maintain her aspirin, Cholecalciferol, promethazine, meloxicam, cyclobenzaprine, multivitamin, amLODipine, potassium chloride, SYMBICORT, albuterol, loratadine, losartan-hydrochlorothiazide, Biotin, anagrelide, gabapentin, and ibuprofen.  No orders of the defined types were placed in this encounter.     Follow-up: No Follow-up on file.  [name], MD

## 2015-10-25 NOTE — Assessment & Plan Note (Signed)
Labs

## 2015-10-28 ENCOUNTER — Other Ambulatory Visit (HOSPITAL_BASED_OUTPATIENT_CLINIC_OR_DEPARTMENT_OTHER): Payer: Medicare Other

## 2015-10-28 DIAGNOSIS — D471 Chronic myeloproliferative disease: Secondary | ICD-10-CM | POA: Diagnosis not present

## 2015-10-28 LAB — CBC WITH DIFFERENTIAL/PLATELET
BASO%: 0.7 % (ref 0.0–2.0)
Basophils Absolute: 0 10*3/uL (ref 0.0–0.1)
EOS%: 0.6 % (ref 0.0–7.0)
Eosinophils Absolute: 0 10*3/uL (ref 0.0–0.5)
HCT: 30.9 % — ABNORMAL LOW (ref 34.8–46.6)
HEMOGLOBIN: 10.3 g/dL — AB (ref 11.6–15.9)
LYMPH%: 25.4 % (ref 14.0–49.7)
MCH: 31.4 pg (ref 25.1–34.0)
MCHC: 33.2 g/dL (ref 31.5–36.0)
MCV: 94.4 fL (ref 79.5–101.0)
MONO#: 0.6 10*3/uL (ref 0.1–0.9)
MONO%: 9.9 % (ref 0.0–14.0)
NEUT%: 63.4 % (ref 38.4–76.8)
NEUTROS ABS: 4 10*3/uL (ref 1.5–6.5)
Platelets: 421 10*3/uL — ABNORMAL HIGH (ref 145–400)
RBC: 3.27 10*6/uL — ABNORMAL LOW (ref 3.70–5.45)
RDW: 12 % (ref 11.2–14.5)
WBC: 6.3 10*3/uL (ref 3.9–10.3)
lymph#: 1.6 10*3/uL (ref 0.9–3.3)

## 2015-10-28 LAB — COMPREHENSIVE METABOLIC PANEL
ALBUMIN: 3.8 g/dL (ref 3.5–5.0)
ALK PHOS: 49 U/L (ref 40–150)
ALT: <9 U/L (ref 0–55)
AST: 17 U/L (ref 5–34)
Anion Gap: 9 mEq/L (ref 3–11)
BILIRUBIN TOTAL: 0.34 mg/dL (ref 0.20–1.20)
BUN: 15.1 mg/dL (ref 7.0–26.0)
CO2: 25 mEq/L (ref 22–29)
Calcium: 9.5 mg/dL (ref 8.4–10.4)
Chloride: 105 mEq/L (ref 98–109)
Creatinine: 1.1 mg/dL (ref 0.6–1.1)
EGFR: 62 mL/min/{1.73_m2} — ABNORMAL LOW (ref >90)
GLUCOSE: 132 mg/dL (ref 70–140)
Potassium: 3.4 mEq/L — ABNORMAL LOW (ref 3.5–5.1)
SODIUM: 139 meq/L (ref 136–145)
TOTAL PROTEIN: 7 g/dL (ref 6.4–8.3)

## 2015-11-09 ENCOUNTER — Other Ambulatory Visit: Payer: Self-pay | Admitting: Hematology

## 2015-11-11 ENCOUNTER — Other Ambulatory Visit: Payer: Self-pay | Admitting: *Deleted

## 2015-11-11 DIAGNOSIS — D471 Chronic myeloproliferative disease: Secondary | ICD-10-CM

## 2015-11-11 MED ORDER — ANAGRELIDE HCL 0.5 MG PO CAPS: 0.5000 mg | ORAL_CAPSULE | Freq: Every day | ORAL | Status: DC

## 2015-11-12 ENCOUNTER — Other Ambulatory Visit: Payer: Self-pay | Admitting: Internal Medicine

## 2015-11-27 ENCOUNTER — Other Ambulatory Visit (HOSPITAL_BASED_OUTPATIENT_CLINIC_OR_DEPARTMENT_OTHER): Payer: Medicare Other

## 2015-11-27 DIAGNOSIS — D471 Chronic myeloproliferative disease: Secondary | ICD-10-CM | POA: Diagnosis not present

## 2015-11-27 LAB — CBC WITH DIFFERENTIAL/PLATELET
BASO%: 0.6 % (ref 0.0–2.0)
Basophils Absolute: 0 10*3/uL (ref 0.0–0.1)
EOS%: 1.8 % (ref 0.0–7.0)
Eosinophils Absolute: 0.1 10*3/uL (ref 0.0–0.5)
HCT: 32.6 % — ABNORMAL LOW (ref 34.8–46.6)
HGB: 10.9 g/dL — ABNORMAL LOW (ref 11.6–15.9)
LYMPH%: 31.3 % (ref 14.0–49.7)
MCH: 31.1 pg (ref 25.1–34.0)
MCHC: 33.3 g/dL (ref 31.5–36.0)
MCV: 93.5 fL (ref 79.5–101.0)
MONO#: 0.5 10*3/uL (ref 0.1–0.9)
MONO%: 9.7 % (ref 0.0–14.0)
NEUT%: 56.6 % (ref 38.4–76.8)
NEUTROS ABS: 3.1 10*3/uL (ref 1.5–6.5)
Platelets: 443 10*3/uL — ABNORMAL HIGH (ref 145–400)
RBC: 3.49 10*6/uL — AB (ref 3.70–5.45)
RDW: 12.1 % (ref 11.2–14.5)
WBC: 5.4 10*3/uL (ref 3.9–10.3)
lymph#: 1.7 10*3/uL (ref 0.9–3.3)

## 2015-12-02 ENCOUNTER — Other Ambulatory Visit: Payer: Self-pay | Admitting: Internal Medicine

## 2015-12-02 DIAGNOSIS — Z1231 Encounter for screening mammogram for malignant neoplasm of breast: Secondary | ICD-10-CM

## 2015-12-11 ENCOUNTER — Other Ambulatory Visit: Payer: Self-pay | Admitting: Internal Medicine

## 2015-12-12 ENCOUNTER — Other Ambulatory Visit: Payer: Self-pay | Admitting: Internal Medicine

## 2015-12-24 ENCOUNTER — Encounter: Payer: Self-pay | Admitting: Hematology

## 2015-12-24 ENCOUNTER — Other Ambulatory Visit (HOSPITAL_BASED_OUTPATIENT_CLINIC_OR_DEPARTMENT_OTHER): Payer: Medicare Other

## 2015-12-24 ENCOUNTER — Telehealth: Payer: Self-pay | Admitting: Hematology

## 2015-12-24 ENCOUNTER — Ambulatory Visit (HOSPITAL_BASED_OUTPATIENT_CLINIC_OR_DEPARTMENT_OTHER): Payer: Medicare Other | Admitting: Hematology

## 2015-12-24 ENCOUNTER — Other Ambulatory Visit: Payer: Self-pay | Admitting: Internal Medicine

## 2015-12-24 VITALS — BP 129/57 | HR 85 | Temp 98.5°F | Resp 18 | Ht 65.0 in | Wt 163.5 lb

## 2015-12-24 DIAGNOSIS — D473 Essential (hemorrhagic) thrombocythemia: Secondary | ICD-10-CM | POA: Diagnosis not present

## 2015-12-24 DIAGNOSIS — M1990 Unspecified osteoarthritis, unspecified site: Secondary | ICD-10-CM | POA: Diagnosis not present

## 2015-12-24 DIAGNOSIS — M549 Dorsalgia, unspecified: Secondary | ICD-10-CM

## 2015-12-24 DIAGNOSIS — D649 Anemia, unspecified: Secondary | ICD-10-CM | POA: Insufficient documentation

## 2015-12-24 DIAGNOSIS — D471 Chronic myeloproliferative disease: Secondary | ICD-10-CM | POA: Diagnosis not present

## 2015-12-24 DIAGNOSIS — R1310 Dysphagia, unspecified: Secondary | ICD-10-CM

## 2015-12-24 DIAGNOSIS — I10 Essential (primary) hypertension: Secondary | ICD-10-CM

## 2015-12-24 LAB — CBC WITH DIFFERENTIAL/PLATELET
BASO%: 0.4 % (ref 0.0–2.0)
Basophils Absolute: 0 10*3/uL (ref 0.0–0.1)
EOS ABS: 0.1 10*3/uL (ref 0.0–0.5)
EOS%: 2.5 % (ref 0.0–7.0)
HEMATOCRIT: 30.1 % — AB (ref 34.8–46.6)
HGB: 10.4 g/dL — ABNORMAL LOW (ref 11.6–15.9)
LYMPH%: 33.3 % (ref 14.0–49.7)
MCH: 31 pg (ref 25.1–34.0)
MCHC: 34.6 g/dL (ref 31.5–36.0)
MCV: 89.9 fL (ref 79.5–101.0)
MONO#: 0.5 10*3/uL (ref 0.1–0.9)
MONO%: 9.8 % (ref 0.0–14.0)
NEUT%: 54 % (ref 38.4–76.8)
NEUTROS ABS: 2.9 10*3/uL (ref 1.5–6.5)
PLATELETS: 430 10*3/uL — AB (ref 145–400)
RBC: 3.35 10*6/uL — AB (ref 3.70–5.45)
RDW: 12 % (ref 11.2–14.5)
WBC: 5.3 10*3/uL (ref 3.9–10.3)
lymph#: 1.8 10*3/uL (ref 0.9–3.3)

## 2015-12-24 NOTE — Progress Notes (Signed)
[place] OFFICE PROGRESS NOTE  [name], MD [place] [address]  DIAGNOSIS: Essential thrombocythemia   MPN (myeloproliferative neoplasm)  PROBLEM LIST: 1. Essential thrombocythemia with thrombocytosis going back to 2000. Mutations for JAK2 V617F, Exon 12, and MPL515 were negative. The patient had a bone marrow on 11/14/2009 which showed slightly hypercellular marrow for the patient's age, along with abundant iron stores. There was minimal reticulin fibrosis. Trichrome stains failed to show any definite collagenous fibrosis. The patient is maintained on hydroxyurea and aspirin since mid June 2011.  2. Right retinal artery occlusion and subsequent retinal infarct on 05/22/2009. The patient's vision has returned to near normal.  3. Peripheral vascular disease involving the right carotid artery detected 08/05/2009.  4. Hypertension  5. Allergic rhinitis  6. History of asthma  7. Adenomatous colonic polyps.  8. Diverticulosis  Chief complaint: Follow-up  CURRENT THERAPY:  1.Hydrea 500 mg  Every day except 1,000 mg on M and Thurs. Decreased to 500mg  daily on 06/10/2015, and further decreased to 500mg  daily except M andThursday due to anemia, stopped on 07/30/2015 2. Added anagrelide 0.5 mg twice daily on 06/10/2015, increased to 1mg  am 0.5mg  pm on 07/30/2015, and decreased to 0.5mg  daily on 09/03/2015   3. Aspirin 325 mg daily.   INTERVAL HISTORY: Paula Griffith 70 y.o. female with presumed essential thrombocythemia currently maintained on aspirin and agrelide is here for follow-up.  She is doing well overall, she has intermittenrt dysphagia with solid food in the past month, was seen by her primary care physician who recommended soft diet. She denies any significant pain, melena or hematochezia. She has good appetite and energy level, no other new complaints.  MEDICAL HISTORY: Past Medical History  Diagnosis Date  . Colon polyps   . Diverticulosis of  colon   . HTN (hypertension)   . LBP (low back pain)   . Osteoarthritis   . Asthma   . Allergic rhinitis   . Glucose intolerance (impaired glucose tolerance)   . PVD (peripheral vascular disease) ([place]) 2011    Right Carotid Dr Scot Dock  . 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 Thrombocytopathy [facility]) 2011    Dr Ralene Ok      ALLERGIES:  is allergic to benazepril; codeine; and tramadol.  MEDICATIONS: has a current medication list which includes the following prescription(s): albuterol, amlodipine, anagrelide, aspirin, biotin, cholecalciferol, cyclobenzaprine, gabapentin, ibuprofen, loratadine, losartan-hydrochlorothiazide, meloxicam, multivitamin, potassium chloride, promethazine, and symbicort.  SURGICAL HISTORY:  Past Surgical History  Procedure Laterality Date  . Abdominal hysterectomy  1989    REVIEW OF SYSTEMS:   Constitutional: Denies fevers, chills or abnormal weight loss Eyes: Denies blurriness of vision Ears, nose, mouth, throat, and face: Denies mucositis or sore throat Respiratory: Denies cough, dyspnea or wheezes Cardiovascular: Denies palpitation, chest discomfort or lower extremity swelling Gastrointestinal:  Denies nausea, heartburn or change in bowel habits Skin: Denies abnormal skin rashes Lymphatics: Denies new lymphadenopathy or easy bruising Neurological:Denies numbness, tingling or new weaknesses Behavioral/Psych: Mood is stable, no new changes  All other systems were reviewed with the patient and are negative.  PHYSICAL EXAMINATION: ECOG PERFORMANCE STATUS: 0 - Asymptomatic  Blood pressure 129/57, pulse 85, temperature 98.5 F (36.9 C), temperature source Oral, resp. rate 18, height 5\' 5"  (1.651 m), weight 163 lb 8 oz (74.163 kg), SpO2 100 %.  GENERAL:alert, no distress and comfortable; well developed and well nourished. SKIN: skin color, texture, turgor are normal, no rashes or significant lesions  EYES: normal, Conjunctiva are pink and non-injected,  sclera clear OROPHARYNX:no exudate, no erythema and lips, buccal mucosa, and tongue normal  NECK: supple, thyroid normal size, non-tender, without nodularity LYMPH:  no palpable lymphadenopathy in the cervical, axillary or supraclavicular LUNGS: clear to auscultation and percussion with normal breathing effort HEART: regular rate & rhythm and no murmurs and no lower extremity edema ABDOMEN:abdomen soft, non-tender and normal bowel sounds Musculoskeletal:no cyanosis of digits and no clubbing ; R ankle larger than Left with slight tenderness to palpation.  NEURO: alert & oriented x 3 with fluent speech, no focal motor/sensory deficits   LABORATORY DATA: CBC Latest Ref Rng 12/24/2015 11/27/2015 10/28/2015  WBC 3.9 - 10.3 10e3/uL 5.3 5.4 6.3  Hemoglobin 11.6 - 15.9 g/dL 10.4(L) 10.9(L) 10.3(L)  Hematocrit 34.8 - 46.6 % 30.1(L) 32.6(L) 30.9(L)  Platelets 145 - 400 10e3/uL 430(H) 443(H) 421(H)    CMP Latest Ref Rng 10/28/2015 10/25/2015 07/11/2015  Glucose 70 - 140 mg/dl 132 118(H) 142(H)  BUN 7.0 - 26.0 mg/dL 15.1 14 16.0  Creatinine 0.6 - 1.1 mg/dL 1.1 0.89 1.1  Sodium 136 - 145 mEq/L 139 134(L) 142  Potassium 3.5 - 5.1 mEq/L 3.4(L) 4.1 3.7  Chloride 96 - 112 mEq/L - 98 -  CO2 22 - 29 mEq/L 25 28 27   Calcium 8.4 - 10.4 mg/dL 9.5 10.0 9.5  Total Protein 6.4 - 8.3 g/dL 7.0 7.1 7.4  Total Bilirubin 0.20 - 1.20 mg/dL 0.34 0.4 0.31  Alkaline Phos 40 - 150 U/L 49 46 67  AST 5 - 34 U/L 17 15 16   ALT 0 - 55 U/L <9 6 <9      RADIOGRAPHIC STUDIES: No results found.  ASSESSMENT: Paula Griffith 70 y.o. female with a history of Essential thrombocythemia    1. Myeloproliferative neoplasm-essential thrombocythemia, JAK2 mutation negative.  -We reviewed the natural history of ET, most patients do well well, major complication is thrombosis. -She was on Hydrea before, I stopped due to her anemia -Her anagrelide dose has been titrated, she is currently on 0.5 mg daily, platelet counts has been  overall controlled, 430K today, we'll continue at the current dose. -I'll monitor her CBC every 2 months   2. Dysphagia -I encouraged her to see her gastroenterologist Dr. Henrene Pastor, she has not had EGD lately -will check her iron study on next visit   3. Arthritis and back pain  -She will continue follow-up with her  PCP and pain management clinic. -Her pain is mild, and she has no limitation on her activities   4. Hypertension and other medical problems She'll continue follow-up with her primary care physician  Plan -continue anagrelide 0.5 mg daily -Repeat a CBC every 2 months, I'll see her back in 4 months - she uses my chart to check her lab results, and she will call me if she notice abnormal platelet count  -I encouraged her to see Dr. Henrene Pastor for her dysphagia   All questions were answered. The patient knows to call the clinic with any problems, questions or concerns. We can certainly see the patient much sooner if necessary.  I spent 20 minutes counseling the patient face to face. The total time spent in the appointment was 25 minutes.     Truitt Merle, MD 12/24/2015

## 2015-12-24 NOTE — Telephone Encounter (Signed)
Gave pt cal & avs °

## 2015-12-26 ENCOUNTER — Ambulatory Visit
Admission: RE | Admit: 2015-12-26 | Discharge: 2015-12-26 | Disposition: A | Discharge status: Home / Self Care | Payer: Medicare Other | Source: Ambulatory Visit | Attending: Internal Medicine | Admitting: Internal Medicine

## 2015-12-26 DIAGNOSIS — Z1231 Encounter for screening mammogram for malignant neoplasm of breast: Secondary | ICD-10-CM | POA: Diagnosis not present

## 2016-01-16 ENCOUNTER — Other Ambulatory Visit: Payer: Self-pay | Admitting: Internal Medicine

## 2016-02-19 ENCOUNTER — Other Ambulatory Visit (HOSPITAL_BASED_OUTPATIENT_CLINIC_OR_DEPARTMENT_OTHER): Payer: Medicare Other

## 2016-02-19 DIAGNOSIS — D471 Chronic myeloproliferative disease: Secondary | ICD-10-CM

## 2016-02-19 DIAGNOSIS — D649 Anemia, unspecified: Secondary | ICD-10-CM

## 2016-02-19 LAB — COMPREHENSIVE METABOLIC PANEL
ALT: <9 U/L (ref 0–55)
AST: 18 U/L (ref 5–34)
Albumin: 3.8 g/dL (ref 3.5–5.0)
Alkaline Phosphatase: 68 U/L (ref 40–150)
Anion Gap: 9 mEq/L (ref 3–11)
BUN: 14.5 mg/dL (ref 7.0–26.0)
CALCIUM: 10.1 mg/dL (ref 8.4–10.4)
CHLORIDE: 101 meq/L (ref 98–109)
CO2: 26 mEq/L (ref 22–29)
Creatinine: 0.9 mg/dL (ref 0.6–1.1)
EGFR: 72 mL/min/{1.73_m2} — AB (ref >90)
Glucose: 144 mg/dl — ABNORMAL HIGH (ref 70–140)
POTASSIUM: 4 meq/L (ref 3.5–5.1)
SODIUM: 137 meq/L (ref 136–145)
Total Bilirubin: 0.52 mg/dL (ref 0.20–1.20)
Total Protein: 7.4 g/dL (ref 6.4–8.3)

## 2016-02-19 LAB — CBC WITH DIFFERENTIAL/PLATELET
BASO%: 0.3 % (ref 0.0–2.0)
Basophils Absolute: 0 10*3/uL (ref 0.0–0.1)
EOS%: 1.6 % (ref 0.0–7.0)
Eosinophils Absolute: 0.1 10*3/uL (ref 0.0–0.5)
HEMATOCRIT: 32 % — AB (ref 34.8–46.6)
HGB: 11 g/dL — ABNORMAL LOW (ref 11.6–15.9)
LYMPH#: 1.6 10*3/uL (ref 0.9–3.3)
LYMPH%: 23.8 % (ref 14.0–49.7)
MCH: 30.8 pg (ref 25.1–34.0)
MCHC: 34.4 g/dL (ref 31.5–36.0)
MCV: 89.6 fL (ref 79.5–101.0)
MONO#: 0.4 10*3/uL (ref 0.1–0.9)
MONO%: 6.2 % (ref 0.0–14.0)
NEUT#: 4.6 10*3/uL (ref 1.5–6.5)
NEUT%: 68.1 % (ref 38.4–76.8)
Platelets: 446 10*3/uL — ABNORMAL HIGH (ref 145–400)
RBC: 3.57 10*6/uL — ABNORMAL LOW (ref 3.70–5.45)
RDW: 12.1 % (ref 11.2–14.5)
WBC: 6.8 10*3/uL (ref 3.9–10.3)

## 2016-02-19 LAB — IRON AND TIBC
%SAT: 29 % (ref 21–57)
Iron: 90 ug/dL (ref 41–142)
TIBC: 314 ug/dL (ref 236–444)
UIBC: 224 ug/dL (ref 120–384)

## 2016-02-19 LAB — FERRITIN: FERRITIN: 189 ng/mL (ref 9–269)

## 2016-03-20 ENCOUNTER — Other Ambulatory Visit: Payer: Self-pay | Admitting: Internal Medicine

## 2016-03-20 DIAGNOSIS — N816 Rectocele: Secondary | ICD-10-CM | POA: Diagnosis not present

## 2016-03-20 DIAGNOSIS — N8111 Cystocele, midline: Secondary | ICD-10-CM | POA: Diagnosis not present

## 2016-03-24 ENCOUNTER — Other Ambulatory Visit: Payer: Self-pay | Admitting: Internal Medicine

## 2016-03-25 ENCOUNTER — Telehealth: Payer: Self-pay | Admitting: *Deleted

## 2016-03-25 NOTE — Telephone Encounter (Signed)
Rec'd call pt states [facility] inform her since the Loratadine can be purchase OTC her insurance will not cover. Pt is want MD to rx alternative that is not OTC...Paula Griffith

## 2016-03-27 NOTE — Telephone Encounter (Signed)
They are all OTC - pls get generic Thx

## 2016-03-30 ENCOUNTER — Other Ambulatory Visit: Payer: Self-pay | Admitting: *Deleted

## 2016-03-30 MED ORDER — AMLODIPINE BESYLATE 5 MG PO TABS: 5.0000 mg | ORAL_TABLET | Freq: Every day | ORAL | 1 refills | Status: DC

## 2016-04-13 ENCOUNTER — Other Ambulatory Visit (HOSPITAL_BASED_OUTPATIENT_CLINIC_OR_DEPARTMENT_OTHER): Payer: Medicare Other

## 2016-04-13 ENCOUNTER — Telehealth: Payer: Self-pay | Admitting: Hematology

## 2016-04-13 ENCOUNTER — Ambulatory Visit (HOSPITAL_BASED_OUTPATIENT_CLINIC_OR_DEPARTMENT_OTHER): Payer: Medicare Other | Admitting: Hematology

## 2016-04-13 VITALS — BP 129/60 | HR 85 | Temp 98.5°F | Resp 18 | Ht 65.0 in | Wt 157.6 lb

## 2016-04-13 DIAGNOSIS — D471 Chronic myeloproliferative disease: Secondary | ICD-10-CM | POA: Diagnosis not present

## 2016-04-13 DIAGNOSIS — M549 Dorsalgia, unspecified: Secondary | ICD-10-CM

## 2016-04-13 DIAGNOSIS — I10 Essential (primary) hypertension: Secondary | ICD-10-CM

## 2016-04-13 DIAGNOSIS — D649 Anemia, unspecified: Secondary | ICD-10-CM

## 2016-04-13 DIAGNOSIS — M1990 Unspecified osteoarthritis, unspecified site: Secondary | ICD-10-CM

## 2016-04-13 LAB — CBC WITH DIFFERENTIAL/PLATELET
BASO%: 0.8 % (ref 0.0–2.0)
BASOS ABS: 0.1 10*3/uL (ref 0.0–0.1)
EOS ABS: 0.1 10*3/uL (ref 0.0–0.5)
EOS%: 0.7 % (ref 0.0–7.0)
HEMATOCRIT: 31.6 % — AB (ref 34.8–46.6)
HEMOGLOBIN: 10.5 g/dL — AB (ref 11.6–15.9)
LYMPH#: 1.6 10*3/uL (ref 0.9–3.3)
LYMPH%: 22.4 % (ref 14.0–49.7)
MCH: 29.9 pg (ref 25.1–34.0)
MCHC: 33.3 g/dL (ref 31.5–36.0)
MCV: 89.8 fL (ref 79.5–101.0)
MONO#: 0.6 10*3/uL (ref 0.1–0.9)
MONO%: 8.8 % (ref 0.0–14.0)
NEUT%: 67.3 % (ref 38.4–76.8)
NEUTROS ABS: 4.9 10*3/uL (ref 1.5–6.5)
PLATELETS: 516 10*3/uL — AB (ref 145–400)
RBC: 3.52 10*6/uL — ABNORMAL LOW (ref 3.70–5.45)
RDW: 11.8 % (ref 11.2–14.5)
WBC: 7.3 10*3/uL (ref 3.9–10.3)

## 2016-04-13 NOTE — Progress Notes (Signed)
[facility] OFFICE PROGRESS NOTE  [name], MD [place] [address]  DIAGNOSIS: Essential thrombocythemia   MPN (myeloproliferative neoplasm)  PROBLEM LIST: 1. Essential thrombocythemia with thrombocytosis going back to 2000. Mutations for JAK2 V617F, Exon 12, and MPL515 were negative. The patient had a bone marrow on 11/14/2009 which showed slightly hypercellular marrow for the patient's age, along with abundant iron stores. There was minimal reticulin fibrosis. Trichrome stains failed to show any definite collagenous fibrosis. The patient is maintained on hydroxyurea and aspirin since mid June 2011.  2. Right retinal artery occlusion and subsequent retinal infarct on 05/22/2009. The patient's vision has returned to near normal.  3. Peripheral vascular disease involving the right carotid artery detected 08/05/2009.  4. Hypertension  5. Allergic rhinitis  6. History of asthma  7. Adenomatous colonic polyps.  8. Diverticulosis  Chief complaint: Follow-up  CURRENT THERAPY:  1.Hydrea 500 mg  Every day except 1,000 mg on M and Thurs. Decreased to 500mg  daily on 06/10/2015, and further decreased to 500mg  daily except M andThursday due to anemia, stopped on 07/30/2015 2. Added anagrelide 0.5 mg twice daily on 06/10/2015, increased to 1mg  am 0.5mg  pm on 07/30/2015, and decreased to 0.5mg  daily on 09/03/2015, increased to 1mg  daily on 04/15/2016  3. Aspirin 325 mg daily.   INTERVAL HISTORY: Paula Griffith 70 y.o. female with presumed essential thrombocythemia currently maintained on aspirin and agrelide is here for follow-up.  She is doing well overall, denies any new symptoms. She has been quite busy with her school, feels well overall. She has been compliant and tolerating anagrelide well.  MEDICAL HISTORY: Past Medical History:  Diagnosis Date  . Allergic rhinitis   . Asthma   . Colon polyps   . Diverticulosis of colon   . Glucose intolerance (impaired  glucose tolerance)   . HTN (hypertension)   . LBP (low back pain)   . Osteoarthritis   . PVD (peripheral vascular disease) ([place]) 2011   Right Carotid Dr Scot Dock  . 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 Thrombocytopathy [facility]) 2011   Dr Ralene Ok      ALLERGIES:  is allergic to benazepril; codeine; and tramadol.  MEDICATIONS: has a current medication list which includes the following prescription(s): albuterol, amlodipine, anagrelide, aspirin, biotin, cholecalciferol, cyclobenzaprine, gabapentin, ibuprofen, klor-con 10, loratadine, losartan-hydrochlorothiazide, meloxicam, multivitamin, promethazine, and symbicort.  SURGICAL HISTORY:  Past Surgical History:  Procedure Laterality Date  . ABDOMINAL HYSTERECTOMY  1989    REVIEW OF SYSTEMS:   Constitutional: Denies fevers, chills or abnormal weight loss Eyes: Denies blurriness of vision Ears, nose, mouth, throat, and face: Denies mucositis or sore throat Respiratory: Denies cough, dyspnea or wheezes Cardiovascular: Denies palpitation, chest discomfort or lower extremity swelling Gastrointestinal:  Denies nausea, heartburn or change in bowel habits Skin: Denies abnormal skin rashes Lymphatics: Denies new lymphadenopathy or easy bruising Neurological:Denies numbness, tingling or new weaknesses Behavioral/Psych: Mood is stable, no new changes  All other systems were reviewed with the patient and are negative.  PHYSICAL EXAMINATION: ECOG PERFORMANCE STATUS: 0 - Asymptomatic  Blood pressure 129/60, pulse 85, temperature 98.5 F (36.9 C), temperature source Oral, resp. rate 18, height 5\' 5"  (1.651 m), weight 157 lb 9.6 oz (71.5 kg), SpO2 100 %.  GENERAL:alert, no distress and comfortable; well developed and well nourished. SKIN: skin color, texture, turgor are normal, no rashes or significant lesions EYES: normal, Conjunctiva are pink and non-injected, sclera clear OROPHARYNX:no exudate, no erythema and lips, buccal  mucosa, and tongue  normal  NECK: supple, thyroid normal size, non-tender, without nodularity LYMPH:  no palpable lymphadenopathy in the cervical, axillary or supraclavicular LUNGS: clear to auscultation and percussion with normal breathing effort HEART: regular rate & rhythm and no murmurs and no lower extremity edema ABDOMEN:abdomen soft, non-tender and normal bowel sounds Musculoskeletal:no cyanosis of digits and no clubbing ; R ankle larger than Left with slight tenderness to palpation.  NEURO: alert & oriented x 3 with fluent speech, no focal motor/sensory deficits   LABORATORY DATA: CBC Latest Ref Rng & Units 04/13/2016 02/19/2016 12/24/2015  WBC 3.9 - 10.3 10e3/uL 7.3 6.8 5.3  Hemoglobin 11.6 - 15.9 g/dL 10.5(L) 11.0(L) 10.4(L)  Hematocrit 34.8 - 46.6 % 31.6(L) 32.0(L) 30.1(L)  Platelets 145 - 400 10e3/uL 516(H) 446(H) 430(H)    CMP Latest Ref Rng & Units 02/19/2016 10/28/2015 10/25/2015  Glucose 70 - 140 mg/dl 144(H) 132 118(H)  BUN 7.0 - 26.0 mg/dL 14.5 15.1 14  Creatinine 0.6 - 1.1 mg/dL 0.9 1.1 0.89  Sodium 136 - 145 mEq/L 137 139 134(L)  Potassium 3.5 - 5.1 mEq/L 4.0 3.4(L) 4.1  Chloride 96 - 112 mEq/L - - 98  CO2 22 - 29 mEq/L 26 25 28   Calcium 8.4 - 10.4 mg/dL 10.1 9.5 10.0  Total Protein 6.4 - 8.3 g/dL 7.4 7.0 7.1  Total Bilirubin 0.20 - 1.20 mg/dL 0.52 0.34 0.4  Alkaline Phos 40 - 150 U/L 68 49 46  AST 5 - 34 U/L 18 17 15   ALT 0 - 55 U/L <9 <9 6      RADIOGRAPHIC STUDIES: No results found.  ASSESSMENT: Paula Griffith 70 y.o. female with a history of Essential thrombocythemia    1. Myeloproliferative neoplasm-essential thrombocythemia, JAK2 mutation negative.  -We reviewed the natural history of ET, most patients do well well, major complication is thrombosis. -She was on Hydrea before, I stopped due to her anemia -Her anagrelide dose has been titrated, she is currently on 0.5 mg daily, platelet counts has been slightly trending up, 516K today  -I recommend her to increase  anagrelide to 0.5mg  twice daily  -I'll monitor her CBC every 2 weeks and I'll see her back in 6 weeks  2. Arthritis and back pain  -She will continue follow-up with her  PCP and pain management clinic. -Her pain is mild, and she has no limitation on her activities   3. Hypertension and other medical problems She'll continue follow-up with her primary care physician  Plan -increase anagrelide 0.5 mg twice daily  -Repeat a CBC every 2 weeks, I'll see her back in 6 weeks - she uses my chart to check her lab results, and she will call me if she notice abnormal platelet count   All questions were answered. The patient knows to call the clinic with any problems, questions or concerns. We can certainly see the patient much sooner if necessary.  I spent 15 minutes counseling the patient face to face. The total time spent in the appointment was 20 minutes.     Truitt Merle, MD 04/13/16

## 2016-04-13 NOTE — Telephone Encounter (Signed)
GAVE PATIENT AVS REPORT AND APPOINTMENTS FOR October AND November  °

## 2016-04-14 ENCOUNTER — Encounter: Payer: Self-pay | Admitting: Hematology

## 2016-04-14 MED ORDER — ANAGRELIDE HCL 0.5 MG PO CAPS: 0.5000 mg | ORAL_CAPSULE | Freq: Two times a day (BID) | ORAL | 2 refills | Status: DC

## 2016-04-15 ENCOUNTER — Other Ambulatory Visit: Payer: Self-pay | Admitting: *Deleted

## 2016-04-15 MED ORDER — IBUPROFEN 600 MG PO TABS: 600.0000 mg | ORAL_TABLET | Freq: Two times a day (BID) | ORAL | 0 refills | Status: DC | PRN

## 2016-04-20 ENCOUNTER — Ambulatory Visit (INDEPENDENT_AMBULATORY_CARE_PROVIDER_SITE_OTHER): Payer: Medicare Other | Admitting: Internal Medicine

## 2016-04-20 ENCOUNTER — Encounter: Payer: Self-pay | Admitting: Internal Medicine

## 2016-04-20 VITALS — BP 120/58 | HR 77 | Temp 98.9°F | Ht 65.0 in | Wt 155.0 lb

## 2016-04-20 DIAGNOSIS — J4531 Mild persistent asthma with (acute) exacerbation: Secondary | ICD-10-CM

## 2016-04-20 DIAGNOSIS — R739 Hyperglycemia, unspecified: Secondary | ICD-10-CM

## 2016-04-20 DIAGNOSIS — I10 Essential (primary) hypertension: Secondary | ICD-10-CM

## 2016-04-20 DIAGNOSIS — Z86010 Personal history of colonic polyps: Secondary | ICD-10-CM

## 2016-04-20 DIAGNOSIS — Z0000 Encounter for general adult medical examination without abnormal findings: Secondary | ICD-10-CM | POA: Diagnosis not present

## 2016-04-20 DIAGNOSIS — E785 Hyperlipidemia, unspecified: Secondary | ICD-10-CM | POA: Diagnosis not present

## 2016-04-20 DIAGNOSIS — J3089 Other allergic rhinitis: Secondary | ICD-10-CM | POA: Diagnosis not present

## 2016-04-20 DIAGNOSIS — Z23 Encounter for immunization: Secondary | ICD-10-CM

## 2016-04-20 MED ORDER — BUDESONIDE-FORMOTEROL FUMARATE 80-4.5 MCG/ACT IN AERO: 2.0000 | INHALATION_SPRAY | Freq: Two times a day (BID) | RESPIRATORY_TRACT | 1 refills | Status: DC

## 2016-04-20 MED ORDER — ALBUTEROL SULFATE HFA 108 (90 BASE) MCG/ACT IN AERS: 2.0000 | INHALATION_SPRAY | RESPIRATORY_TRACT | 3 refills | Status: DC | PRN

## 2016-04-20 NOTE — Patient Instructions (Signed)
Preventive Care for Adults, Female A healthy lifestyle and preventive care can promote health and wellness. Preventive health guidelines for women include the following key practices.  A routine yearly physical is a good way to check with your health care provider about your health and preventive screening. It is a chance to share any concerns and updates on your health and to receive a thorough exam.  Visit your dentist for a routine exam and preventive care every 6 months. Brush your teeth twice a day and floss once a day. Good oral hygiene prevents tooth decay and gum disease.  The frequency of eye exams is based on your age, health, family medical history, use of contact lenses, and other factors. Follow your health care provider's recommendations for frequency of eye exams.  Eat a healthy diet. Foods like vegetables, fruits, whole grains, low-fat dairy products, and lean protein foods contain the nutrients you need without too many calories. Decrease your intake of foods high in solid fats, added sugars, and salt. Eat the right amount of calories for you.Get information about a proper diet from your health care provider, if necessary.  Regular physical exercise is one of the most important things you can do for your health. Most adults should get at least 150 minutes of moderate-intensity exercise (any activity that increases your heart rate and causes you to sweat) each week. In addition, most adults need muscle-strengthening exercises on 2 or more days a week.  Maintain a healthy weight. The body mass index (BMI) is a screening tool to identify possible weight problems. It provides an estimate of body fat based on height and weight. Your health care provider can find your BMI and can help you achieve or maintain a healthy weight.For adults 20 years and older:  A BMI below 18.5 is considered underweight.  A BMI of 18.5 to 24.9 is normal.  A BMI of 25 to 29.9 is considered overweight.  A  BMI of 30 and above is considered obese.  Maintain normal blood lipids and cholesterol levels by exercising and minimizing your intake of saturated fat. Eat a balanced diet with plenty of fruit and vegetables. Blood tests for lipids and cholesterol should begin at age 45 and be repeated every 5 years. If your lipid or cholesterol levels are high, you are over 50, or you are at high risk for heart disease, you may need your cholesterol levels checked more frequently.Ongoing high lipid and cholesterol levels should be treated with medicines if diet and exercise are not working.  If you smoke, find out from your health care provider how to quit. If you do not use tobacco, do not start.  Lung cancer screening is recommended for adults aged 45-80 years who are at high risk for developing lung cancer because of a history of smoking. A yearly low-dose CT scan of the lungs is recommended for people who have at least a 30-pack-year history of smoking and are a current smoker or have quit within the past 15 years. A pack year of smoking is smoking an average of 1 pack of cigarettes a day for 1 year (for example: 1 pack a day for 30 years or 2 packs a day for 15 years). Yearly screening should continue until the smoker has stopped smoking for at least 15 years. Yearly screening should be stopped for people who develop a health problem that would prevent them from having lung cancer treatment.  If you are pregnant, do not drink alcohol. If you are  breastfeeding, be very cautious about drinking alcohol. If you are not pregnant and choose to drink alcohol, do not have more than 1 drink per day. One drink is considered to be 12 ounces (355 mL) of beer, 5 ounces (148 mL) of wine, or 1.5 ounces (44 mL) of liquor.  Avoid use of street drugs. Do not share needles with anyone. Ask for help if you need support or instructions about stopping the use of drugs.  High blood pressure causes heart disease and increases the risk  of stroke. Your blood pressure should be checked at least every 1 to 2 years. Ongoing high blood pressure should be treated with medicines if weight loss and exercise do not work.  If you are 55-79 years old, ask your health care provider if you should take aspirin to prevent strokes.  Diabetes screening is done by taking a blood sample to check your blood glucose level after you have not eaten for a certain period of time (fasting). If you are not overweight and you do not have risk factors for diabetes, you should be screened once every 3 years starting at age 45. If you are overweight or obese and you are 40-70 years of age, you should be screened for diabetes every year as part of your cardiovascular risk assessment.  Breast cancer screening is essential preventive care for women. You should practice "breast self-awareness." This means understanding the normal appearance and feel of your breasts and may include breast self-examination. Any changes detected, no matter how small, should be reported to a health care provider. Women in their 20s and 30s should have a clinical breast exam (CBE) by a health care provider as part of a regular health exam every 1 to 3 years. After age 40, women should have a CBE every year. Starting at age 40, women should consider having a mammogram (breast X-ray test) every year. Women who have a family history of breast cancer should talk to their health care provider about genetic screening. Women at a high risk of breast cancer should talk to their health care providers about having an MRI and a mammogram every year.  Breast cancer gene (BRCA)-related cancer risk assessment is recommended for women who have family members with BRCA-related cancers. BRCA-related cancers include breast, ovarian, tubal, and peritoneal cancers. Having family members with these cancers may be associated with an increased risk for harmful changes (mutations) in the breast cancer genes BRCA1 and  BRCA2. Results of the assessment will determine the need for genetic counseling and BRCA1 and BRCA2 testing.  Your health care provider may recommend that you be screened regularly for cancer of the pelvic organs (ovaries, uterus, and vagina). This screening involves a pelvic examination, including checking for microscopic changes to the surface of your cervix (Pap test). You may be encouraged to have this screening done every 3 years, beginning at age 21.  For women ages 30-65, health care providers may recommend pelvic exams and Pap testing every 3 years, or they may recommend the Pap and pelvic exam, combined with testing for human papilloma virus (HPV), every 5 years. Some types of HPV increase your risk of cervical cancer. Testing for HPV may also be done on women of any age with unclear Pap test results.  Other health care providers may not recommend any screening for nonpregnant women who are considered low risk for pelvic cancer and who do not have symptoms. Ask your health care provider if a screening pelvic exam is right for   you.  If you have had past treatment for cervical cancer or a condition that could lead to cancer, you need Pap tests and screening for cancer for at least 20 years after your treatment. If Pap tests have been discontinued, your risk factors (such as having a new sexual partner) need to be reassessed to determine if screening should resume. Some women have medical problems that increase the chance of getting cervical cancer. In these cases, your health care provider may recommend more frequent screening and Pap tests.  Colorectal cancer can be detected and often prevented. Most routine colorectal cancer screening begins at the age of 50 years and continues through age 75 years. However, your health care provider may recommend screening at an earlier age if you have risk factors for colon cancer. On a yearly basis, your health care provider may provide home test kits to check  for hidden blood in the stool. Use of a small camera at the end of a tube, to directly examine the colon (sigmoidoscopy or colonoscopy), can detect the earliest forms of colorectal cancer. Talk to your health care provider about this at age 50, when routine screening begins. Direct exam of the colon should be repeated every 5-10 years through age 75 years, unless early forms of precancerous polyps or small growths are found.  People who are at an increased risk for hepatitis B should be screened for this virus. You are considered at high risk for hepatitis B if:  You were born in a country where hepatitis B occurs often. Talk with your health care provider about which countries are considered high risk.  Your parents were born in a high-risk country and you have not received a shot to protect against hepatitis B (hepatitis B vaccine).  You have HIV or AIDS.  You use needles to inject street drugs.  You live with, or have sex with, someone who has hepatitis B.  You get hemodialysis treatment.  You take certain medicines for conditions like cancer, organ transplantation, and autoimmune conditions.  Hepatitis C blood testing is recommended for all people born from 1945 through 1965 and any individual with known risks for hepatitis C.  Practice safe sex. Use condoms and avoid high-risk sexual practices to reduce the spread of sexually transmitted infections (STIs). STIs include gonorrhea, chlamydia, syphilis, trichomonas, herpes, HPV, and human immunodeficiency virus (HIV). Herpes, HIV, and HPV are viral illnesses that have no cure. They can result in disability, cancer, and death.  You should be screened for sexually transmitted illnesses (STIs) including gonorrhea and chlamydia if:  You are sexually active and are younger than 24 years.  You are older than 24 years and your health care provider tells you that you are at risk for this type of infection.  Your sexual activity has changed  since you were last screened and you are at an increased risk for chlamydia or gonorrhea. Ask your health care provider if you are at risk.  If you are at risk of being infected with HIV, it is recommended that you take a prescription medicine daily to prevent HIV infection. This is called preexposure prophylaxis (PrEP). You are considered at risk if:  You are sexually active and do not regularly use condoms or know the HIV status of your partner(s).  You take drugs by injection.  You are sexually active with a partner who has HIV.  Talk with your health care provider about whether you are at high risk of being infected with HIV. If   you choose to begin PrEP, you should first be tested for HIV. You should then be tested every 3 months for as long as you are taking PrEP.  Osteoporosis is a disease in which the bones lose minerals and strength with aging. This can result in serious bone fractures or breaks. The risk of osteoporosis can be identified using a bone density scan. Women ages 67 years and over and women at risk for fractures or osteoporosis should discuss screening with their health care providers. Ask your health care provider whether you should take a calcium supplement or vitamin D to reduce the rate of osteoporosis.  Menopause can be associated with physical symptoms and risks. Hormone replacement therapy is available to decrease symptoms and risks. You should talk to your health care provider about whether hormone replacement therapy is right for you.  Use sunscreen. Apply sunscreen liberally and repeatedly throughout the day. You should seek shade when your shadow is shorter than you. Protect yourself by wearing long sleeves, pants, a wide-brimmed hat, and sunglasses year round, whenever you are outdoors.  Once a month, do a whole body skin exam, using a mirror to look at the skin on your back. Tell your health care provider of new moles, moles that have irregular borders, moles that  are larger than a pencil eraser, or moles that have changed in shape or color.  Stay current with required vaccines (immunizations).  Influenza vaccine. All adults should be immunized every year.  Tetanus, diphtheria, and acellular pertussis (Td, Tdap) vaccine. Pregnant women should receive 1 dose of Tdap vaccine during each pregnancy. The dose should be obtained regardless of the length of time since the last dose. Immunization is preferred during the 27th-36th week of gestation. An adult who has not previously received Tdap or who does not know her vaccine status should receive 1 dose of Tdap. This initial dose should be followed by tetanus and diphtheria toxoids (Td) booster doses every 10 years. Adults with an unknown or incomplete history of completing a 3-dose immunization series with Td-containing vaccines should begin or complete a primary immunization series including a Tdap dose. Adults should receive a Td booster every 10 years.  Varicella vaccine. An adult without evidence of immunity to varicella should receive 2 doses or a second dose if she has previously received 1 dose. Pregnant females who do not have evidence of immunity should receive the first dose after pregnancy. This first dose should be obtained before leaving the health care facility. The second dose should be obtained 4-8 weeks after the first dose.  Human papillomavirus (HPV) vaccine. Females aged 13-26 years who have not received the vaccine previously should obtain the 3-dose series. The vaccine is not recommended for use in pregnant females. However, pregnancy testing is not needed before receiving a dose. If a female is found to be pregnant after receiving a dose, no treatment is needed. In that case, the remaining doses should be delayed until after the pregnancy. Immunization is recommended for any person with an immunocompromised condition through the age of 61 years if she did not get any or all doses earlier. During the  3-dose series, the second dose should be obtained 4-8 weeks after the first dose. The third dose should be obtained 24 weeks after the first dose and 16 weeks after the second dose.  Zoster vaccine. One dose is recommended for adults aged 30 years or older unless certain conditions are present.  Measles, mumps, and rubella (MMR) vaccine. Adults born  before 1957 generally are considered immune to measles and mumps. Adults born in 1957 or later should have 1 or more doses of MMR vaccine unless there is a contraindication to the vaccine or there is laboratory evidence of immunity to each of the three diseases. A routine second dose of MMR vaccine should be obtained at least 28 days after the first dose for students attending postsecondary schools, health care workers, or international travelers. People who received inactivated measles vaccine or an unknown type of measles vaccine during 1963-1967 should receive 2 doses of MMR vaccine. People who received inactivated mumps vaccine or an unknown type of mumps vaccine before 1979 and are at high risk for mumps infection should consider immunization with 2 doses of MMR vaccine. For females of childbearing age, rubella immunity should be determined. If there is no evidence of immunity, females who are not pregnant should be vaccinated. If there is no evidence of immunity, females who are pregnant should delay immunization until after pregnancy. Unvaccinated health care workers born before 1957 who lack laboratory evidence of measles, mumps, or rubella immunity or laboratory confirmation of disease should consider measles and mumps immunization with 2 doses of MMR vaccine or rubella immunization with 1 dose of MMR vaccine.  Pneumococcal 13-valent conjugate (PCV13) vaccine. When indicated, a person who is uncertain of his immunization history and has no record of immunization should receive the PCV13 vaccine. All adults 65 years of age and older should receive this  vaccine. An adult aged 19 years or older who has certain medical conditions and has not been previously immunized should receive 1 dose of PCV13 vaccine. This PCV13 should be followed with a dose of pneumococcal polysaccharide (PPSV23) vaccine. Adults who are at high risk for pneumococcal disease should obtain the PPSV23 vaccine at least 8 weeks after the dose of PCV13 vaccine. Adults older than 70 years of age who have normal immune system function should obtain the PPSV23 vaccine dose at least 1 year after the dose of PCV13 vaccine.  Pneumococcal polysaccharide (PPSV23) vaccine. When PCV13 is also indicated, PCV13 should be obtained first. All adults aged 65 years and older should be immunized. An adult younger than age 65 years who has certain medical conditions should be immunized. Any person who resides in a nursing home or long-term care facility should be immunized. An adult smoker should be immunized. People with an immunocompromised condition and certain other conditions should receive both PCV13 and PPSV23 vaccines. People with human immunodeficiency virus (HIV) infection should be immunized as soon as possible after diagnosis. Immunization during chemotherapy or radiation therapy should be avoided. Routine use of PPSV23 vaccine is not recommended for American Indians, [place] Natives, or people younger than 65 years unless there are medical conditions that require PPSV23 vaccine. When indicated, people who have unknown immunization and have no record of immunization should receive PPSV23 vaccine. One-time revaccination 5 years after the first dose of PPSV23 is recommended for people aged 19-64 years who have chronic kidney failure, nephrotic syndrome, asplenia, or immunocompromised conditions. People who received 1-2 doses of PPSV23 before age 65 years should receive another dose of PPSV23 vaccine at age 65 years or later if at least 5 years have passed since the previous dose. Doses of PPSV23 are not  needed for people immunized with PPSV23 at or after age 65 years.  Meningococcal vaccine. Adults with asplenia or persistent complement component deficiencies should receive 2 doses of quadrivalent meningococcal conjugate (MenACWY-D) vaccine. The doses should be obtained   at least 2 months apart. Microbiologists working with certain meningococcal bacteria, [place] recruits, people at risk during an outbreak, and people who travel to or live in countries with a high rate of meningitis should be immunized. A first-year college student up through age 34 years who is living in a residence hall should receive a dose if she did not receive a dose on or after her 16th birthday. Adults who have certain high-risk conditions should receive one or more doses of vaccine.  Hepatitis A vaccine. Adults who wish to be protected from this disease, have certain high-risk conditions, work with hepatitis A-infected animals, work in hepatitis A research labs, or travel to or work in countries with a high rate of hepatitis A should be immunized. Adults who were previously unvaccinated and who anticipate close contact with an international adoptee during the first 60 days after arrival in the [place] from a country with a high rate of hepatitis A should be immunized.  Hepatitis B vaccine. Adults who wish to be protected from this disease, have certain high-risk conditions, may be exposed to blood or other infectious body fluids, are household contacts or sex partners of hepatitis B positive people, are clients or workers in certain care facilities, or travel to or work in countries with a high rate of hepatitis B should be immunized.  Haemophilus influenzae type b (Hib) vaccine. A previously unvaccinated person with asplenia or sickle cell disease or having a scheduled splenectomy should receive 1 dose of Hib vaccine. Regardless of previous immunization, a recipient of a hematopoietic stem cell transplant should receive a  3-dose series 6-12 months after her successful transplant. Hib vaccine is not recommended for adults with HIV infection. Preventive Services / Frequency Ages 35 to 4 years  Blood pressure check.** / Every 3-5 years.  Lipid and cholesterol check.** / Every 5 years beginning at age 60.  Clinical breast exam.** / Every 3 years for women in their 71s and 10s.  BRCA-related cancer risk assessment.** / For women who have family members with a BRCA-related cancer (breast, ovarian, tubal, or peritoneal cancers).  Pap test.** / Every 2 years from ages 76 through 26. Every 3 years starting at age 61 through age 76 or 93 with a history of 3 consecutive normal Pap tests.  HPV screening.** / Every 3 years from ages 37 through ages 60 to 51 with a history of 3 consecutive normal Pap tests.  Hepatitis C blood test.** / For any individual with known risks for hepatitis C.  Skin self-exam. / Monthly.  Influenza vaccine. / Every year.  Tetanus, diphtheria, and acellular pertussis (Tdap, Td) vaccine.** / Consult your health care provider. Pregnant women should receive 1 dose of Tdap vaccine during each pregnancy. 1 dose of Td every 10 years.  Varicella vaccine.** / Consult your health care provider. Pregnant females who do not have evidence of immunity should receive the first dose after pregnancy.  HPV vaccine. / 3 doses over 6 months, if 93 and younger. The vaccine is not recommended for use in pregnant females. However, pregnancy testing is not needed before receiving a dose.  Measles, mumps, rubella (MMR) vaccine.** / You need at least 1 dose of MMR if you were born in 1957 or later. You may also need a 2nd dose. For females of childbearing age, rubella immunity should be determined. If there is no evidence of immunity, females who are not pregnant should be vaccinated. If there is no evidence of immunity, females who are  pregnant should delay immunization until after pregnancy.  Pneumococcal  13-valent conjugate (PCV13) vaccine.** / Consult your health care provider.  Pneumococcal polysaccharide (PPSV23) vaccine.** / 1 to 2 doses if you smoke cigarettes or if you have certain conditions.  Meningococcal vaccine.** / 1 dose if you are age 68 to 8 years and a Market researcher living in a residence hall, or have one of several medical conditions, you need to get vaccinated against meningococcal disease. You may also need additional booster doses.  Hepatitis A vaccine.** / Consult your health care provider.  Hepatitis B vaccine.** / Consult your health care provider.  Haemophilus influenzae type b (Hib) vaccine.** / Consult your health care provider. Ages 7 to 53 years  Blood pressure check.** / Every year.  Lipid and cholesterol check.** / Every 5 years beginning at age 25 years.  Lung cancer screening. / Every year if you are aged 11-80 years and have a 30-pack-year history of smoking and currently smoke or have quit within the past 15 years. Yearly screening is stopped once you have quit smoking for at least 15 years or develop a health problem that would prevent you from having lung cancer treatment.  Clinical breast exam.** / Every year after age 48 years.  BRCA-related cancer risk assessment.** / For women who have family members with a BRCA-related cancer (breast, ovarian, tubal, or peritoneal cancers).  Mammogram.** / Every year beginning at age 41 years and continuing for as long as you are in good health. Consult with your health care provider.  Pap test.** / Every 3 years starting at age 65 years through age 37 or 70 years with a history of 3 consecutive normal Pap tests.  HPV screening.** / Every 3 years from ages 72 years through ages 60 to 40 years with a history of 3 consecutive normal Pap tests.  Fecal occult blood test (FOBT) of stool. / Every year beginning at age 21 years and continuing until age 5 years. You may not need to do this test if you get  a colonoscopy every 10 years.  Flexible sigmoidoscopy or colonoscopy.** / Every 5 years for a flexible sigmoidoscopy or every 10 years for a colonoscopy beginning at age 35 years and continuing until age 48 years.  Hepatitis C blood test.** / For all people born from [birth year] through 1965 and any individual with known risks for hepatitis C.  Skin self-exam. / Monthly.  Influenza vaccine. / Every year.  Tetanus, diphtheria, and acellular pertussis (Tdap/Td) vaccine.** / Consult your health care provider. Pregnant women should receive 1 dose of Tdap vaccine during each pregnancy. 1 dose of Td every 10 years.  Varicella vaccine.** / Consult your health care provider. Pregnant females who do not have evidence of immunity should receive the first dose after pregnancy.  Zoster vaccine.** / 1 dose for adults aged 30 years or older.  Measles, mumps, rubella (MMR) vaccine.** / You need at least 1 dose of MMR if you were born in 1957 or later. You may also need a second dose. For females of childbearing age, rubella immunity should be determined. If there is no evidence of immunity, females who are not pregnant should be vaccinated. If there is no evidence of immunity, females who are pregnant should delay immunization until after pregnancy.  Pneumococcal 13-valent conjugate (PCV13) vaccine.** / Consult your health care provider.  Pneumococcal polysaccharide (PPSV23) vaccine.** / 1 to 2 doses if you smoke cigarettes or if you have certain conditions.  Meningococcal vaccine.** /  Consult your health care provider.  Hepatitis A vaccine.** / Consult your health care provider.  Hepatitis B vaccine.** / Consult your health care provider.  Haemophilus influenzae type b (Hib) vaccine.** / Consult your health care provider. Ages 64 years and over  Blood pressure check.** / Every year.  Lipid and cholesterol check.** / Every 5 years beginning at age 23 years.  Lung cancer screening. / Every year if you  are aged 16-80 years and have a 30-pack-year history of smoking and currently smoke or have quit within the past 15 years. Yearly screening is stopped once you have quit smoking for at least 15 years or develop a health problem that would prevent you from having lung cancer treatment.  Clinical breast exam.** / Every year after age 74 years.  BRCA-related cancer risk assessment.** / For women who have family members with a BRCA-related cancer (breast, ovarian, tubal, or peritoneal cancers).  Mammogram.** / Every year beginning at age 44 years and continuing for as long as you are in good health. Consult with your health care provider.  Pap test.** / Every 3 years starting at age 58 years through age 22 or 39 years with 3 consecutive normal Pap tests. Testing can be stopped between 65 and 70 years with 3 consecutive normal Pap tests and no abnormal Pap or HPV tests in the past 10 years.  HPV screening.** / Every 3 years from ages 64 years through ages 70 or 61 years with a history of 3 consecutive normal Pap tests. Testing can be stopped between 65 and 70 years with 3 consecutive normal Pap tests and no abnormal Pap or HPV tests in the past 10 years.  Fecal occult blood test (FOBT) of stool. / Every year beginning at age 40 years and continuing until age 27 years. You may not need to do this test if you get a colonoscopy every 10 years.  Flexible sigmoidoscopy or colonoscopy.** / Every 5 years for a flexible sigmoidoscopy or every 10 years for a colonoscopy beginning at age 7 years and continuing until age 32 years.  Hepatitis C blood test.** / For all people born from [birth year] through 1965 and any individual with known risks for hepatitis C.  Osteoporosis screening.** / A one-time screening for women ages 30 years and over and women at risk for fractures or osteoporosis.  Skin self-exam. / Monthly.  Influenza vaccine. / Every year.  Tetanus, diphtheria, and acellular pertussis (Tdap/Td)  vaccine.** / 1 dose of Td every 10 years.  Varicella vaccine.** / Consult your health care provider.  Zoster vaccine.** / 1 dose for adults aged 35 years or older.  Pneumococcal 13-valent conjugate (PCV13) vaccine.** / Consult your health care provider.  Pneumococcal polysaccharide (PPSV23) vaccine.** / 1 dose for all adults aged 46 years and older.  Meningococcal vaccine.** / Consult your health care provider.  Hepatitis A vaccine.** / Consult your health care provider.  Hepatitis B vaccine.** / Consult your health care provider.  Haemophilus influenzae type b (Hib) vaccine.** / Consult your health care provider. ** Family history and personal history of risk and conditions may change your health care provider's recommendations.   This information is not intended to replace advice given to you by your health care provider. Make sure you discuss any questions you have with your health care provider.   Document Released: 08/11/2001 Document Revised: 07/06/2014 Document Reviewed: 11/10/2010 Elsevier Interactive Patient Education [employer].

## 2016-04-20 NOTE — Progress Notes (Signed)
Pre visit review using our clinic review tool, if applicable. No additional management support is needed unless otherwise documented below in the visit note. 

## 2016-04-20 NOTE — Assessment & Plan Note (Signed)
Dr Henrene Pastor: colon due in 2024

## 2016-04-20 NOTE — Addendum Note (Signed)
Addended by: Cresenciano Lick on: 04/20/2016 10:03 AM   Modules accepted: Orders

## 2016-04-20 NOTE — Assessment & Plan Note (Signed)
10/17 - worse post-URI Proair prn, Symbicort

## 2016-04-20 NOTE — Assessment & Plan Note (Signed)
Losartan HCT, Amlodipine 

## 2016-04-20 NOTE — Progress Notes (Signed)
Subjective:  Patient ID: Paula Griffith, female    DOB: 07-Oct-1945  Age: 70 y.o. MRN: [id number]  CC: No chief complaint on file.   HPI Paula Griffith presents for a well exam. C/o stress at home. F/u HTN, asthma.  Outpatient Medications Prior to Visit  Medication Sig Dispense Refill  . albuterol (PROAIR HFA) 108 (90 BASE) MCG/ACT inhaler Inhale 2 puffs into the lungs every 4 (four) hours as needed. For shortness of breath 1 Inhaler 3  . amLODipine (NORVASC) 5 MG tablet Take 1 tablet (5 mg total) by mouth daily. 90 tablet 1  . anagrelide (AGRYLIN) 0.5 MG capsule Take 1 capsule (0.5 mg total) by mouth 2 (two) times daily. As directed. 90 capsule 2  . aspirin 325 MG EC tablet Take 325 mg by mouth daily after breakfast.      . Biotin (BIOTIN MAXIMUM STRENGTH) 10 MG TABS Take by mouth daily.    . Cholecalciferol 1000 UNITS tablet Take 1,000 Units by mouth daily.      . cyclobenzaprine (FLEXERIL) 10 MG tablet Take 10 mg by mouth 2 (two) times daily. Reported on 07/29/2015    . gabapentin (NEURONTIN) 100 MG capsule TAKE 1 CAPSULE (100 MG TOTAL) BY MOUTH 3 (THREE) TIMES DAILY. 90 capsule 5  . ibuprofen (ADVIL,MOTRIN) 600 MG tablet Take 1 tablet (600 mg total) by mouth 2 (two) times daily as needed. 180 tablet 0  . KLOR-CON 10 10 MEQ tablet TAKE 1 TABLET BY MOUTH ONCE DAILY 90 tablet 1  . losartan-hydrochlorothiazide (HYZAAR) 100-25 MG tablet TAKE 1 TABLET BY MOUTH DAILY. 90 tablet 3  . meloxicam (MOBIC) 7.5 MG tablet Take 7.5 mg by mouth 2 (two) times daily. Reported on 09/30/2015    . Multiple Vitamin (MULTIVITAMIN) capsule Take 2 capsules by mouth daily.     . SYMBICORT 80-4.5 MCG/ACT inhaler INHALE TWO PUFFS INTO LUNGS TWICE DAILY 10.2 g 1  . loratadine (CLARITIN) 10 MG tablet TAKE 1 TABLET (10 MG TOTAL) BY MOUTH DAILY. (Patient not taking: Reported on 04/20/2016) 90 tablet 1   No facility-administered medications prior to visit.     ROS Review of Systems  Constitutional: Negative for  activity change, appetite change, chills, fatigue and unexpected weight change.  HENT: Negative for congestion, mouth sores and sinus pressure.   Eyes: Negative for visual disturbance.  Respiratory: Negative for cough and chest tightness.   Gastrointestinal: Negative for abdominal pain and nausea.  Genitourinary: Negative for difficulty urinating, frequency and vaginal pain.  Musculoskeletal: Negative for back pain and gait problem.  Skin: Negative for pallor and rash.  Neurological: Negative for dizziness, tremors, weakness, numbness and headaches.  Psychiatric/Behavioral: Negative for confusion and sleep disturbance.    Objective:  BP (!) 120/58   Pulse 77   Temp 98.9 F (37.2 C) (Oral)   Ht 5\' 5"  (1.651 m)   Wt 155 lb (70.3 kg)   SpO2 98%   BMI 25.79 kg/m   BP Readings from Last 3 Encounters:  04/20/16 (!) 120/58  04/13/16 129/60  12/24/15 (!) 129/57    Wt Readings from Last 3 Encounters:  04/20/16 155 lb (70.3 kg)  04/13/16 157 lb 9.6 oz (71.5 kg)  12/24/15 163 lb 8 oz (74.2 kg)    Physical Exam  Constitutional: She appears well-developed. No distress.  HENT:  Head: Normocephalic.  Right Ear: External ear normal.  Left Ear: External ear normal.  Nose: Nose normal.  Mouth/Throat: Oropharynx is clear and moist.  Eyes: Conjunctivae  are normal. Pupils are equal, round, and reactive to light. Right eye exhibits no discharge. Left eye exhibits no discharge.  Neck: Normal range of motion. Neck supple. No JVD present. No tracheal deviation present. No thyromegaly present.  Cardiovascular: Normal rate, regular rhythm and normal heart sounds.   Pulmonary/Chest: No stridor. No respiratory distress. She has no wheezes.  Abdominal: Soft. Bowel sounds are normal. She exhibits no distension and no mass. There is no tenderness. There is no rebound and no guarding.  Musculoskeletal: She exhibits no edema or tenderness.  Lymphadenopathy:    She has no cervical adenopathy.    Neurological: She displays normal reflexes. No cranial nerve deficit. She exhibits normal muscle tone. Coordination normal.  Skin: No rash noted. No erythema.  Psychiatric: She has a normal mood and affect. Her behavior is normal. Judgment and thought content normal.    Lab Results  Component Value Date   WBC 7.3 04/13/2016   HGB 10.5 (L) 04/13/2016   HCT 31.6 (L) 04/13/2016   PLT 516 (H) 04/13/2016   GLUCOSE 144 (H) 02/19/2016   CHOL 133 10/25/2015   TRIG 103.0 10/25/2015   HDL 35.70 (L) 10/25/2015   LDLCALC 77 10/25/2015   ALT <9 02/19/2016   AST 18 02/19/2016   NA 137 02/19/2016   K 4.0 02/19/2016   CL 98 10/25/2015   CREATININE 0.9 02/19/2016   BUN 14.5 02/19/2016   CO2 26 02/19/2016   TSH 1.24 10/25/2015   HGBA1C 6.1 10/25/2015    Mm Screening Breast Tomo Bilateral  Result Date: 12/30/2015 CLINICAL DATA:  Screening. EXAM: 2D DIGITAL SCREENING BILATERAL MAMMOGRAM WITH CAD AND ADJUNCT TOMO COMPARISON:  Previous exam(s). ACR Breast Density Category c: The breast tissue is heterogeneously dense, which may obscure small masses. FINDINGS: There are no findings suspicious for malignancy. Images were processed with CAD. IMPRESSION: No mammographic evidence of malignancy. A result letter of this screening mammogram will be mailed directly to the patient. RECOMMENDATION: Screening mammogram in one year. (Code:SM-B-01Y) BI-RADS CATEGORY  1: Negative. Electronically Signed   By: Franki Cabot M.D.   On: 12/30/2015 16:55    Assessment & Plan:   There are no diagnoses linked to this encounter. I am having Ms. Fausey maintain her aspirin, Cholecalciferol, meloxicam, cyclobenzaprine, multivitamin, SYMBICORT, albuterol, Biotin, gabapentin, losartan-hydrochlorothiazide, KLOR-CON 10, loratadine, amLODipine, anagrelide, and ibuprofen.  No orders of the defined types were placed in this encounter.    Follow-up: No Follow-up on file.  [name], MD

## 2016-04-20 NOTE — Assessment & Plan Note (Addendum)
Here for medicare wellness/physical  Diet: heart healthy  Physical activity: not sedentary  Depression/mood screen: stress at home - caring for 2 young kids  Hearing: intact to whispered voice  Visual acuity: grossly normal, performs annual eye exam  ADLs: capable  Fall risk: low to none  Home safety: good  Cognitive evaluation: intact to orientation, naming, recall and repetition  EOL planning: adv directives, full code/ I agree  I have personally reviewed and have noted  1. The patient's medical, surgical and social history  2. Their use of alcohol, tobacco or illicit drugs  3. Their current medications and supplements  4. The patient's functional ability including ADL's, fall risks, home safety risks and hearing or visual impairment.  5. Diet and physical activities  6. Evidence for depression or mood disorders 7. The roster of all physicians providing medical care to patient - is listed in the Snapshot section of the chart and reviewed today.    Today patient counseled on age appropriate routine health concerns for screening and prevention, each reviewed and up to date or declined. Immunizations reviewed and up to date or declined. Labs ordered and reviewed. Risk factors for depression reviewed and negative. Hearing function and visual acuity are intact. ADLs screened and addressed as needed. Functional ability and level of safety reviewed and appropriate. Education, counseling and referrals performed based on assessed risks today. Patient provided with a copy of personalized plan for preventive services.  Dr Henrene Pastor: colon due in 2024

## 2016-04-20 NOTE — Assessment & Plan Note (Signed)
Labs

## 2016-04-27 ENCOUNTER — Other Ambulatory Visit (HOSPITAL_BASED_OUTPATIENT_CLINIC_OR_DEPARTMENT_OTHER): Payer: Medicare Other

## 2016-04-27 DIAGNOSIS — D471 Chronic myeloproliferative disease: Secondary | ICD-10-CM | POA: Diagnosis not present

## 2016-04-27 LAB — CBC WITH DIFFERENTIAL/PLATELET
BASO%: 0.7 % (ref 0.0–2.0)
Basophils Absolute: 0 10*3/uL (ref 0.0–0.1)
EOS ABS: 0 10*3/uL (ref 0.0–0.5)
EOS%: 0.7 % (ref 0.0–7.0)
HEMATOCRIT: 28.7 % — AB (ref 34.8–46.6)
HEMOGLOBIN: 10.1 g/dL — AB (ref 11.6–15.9)
LYMPH%: 30.8 % (ref 14.0–49.7)
MCH: 30.9 pg (ref 25.1–34.0)
MCHC: 35.2 g/dL (ref 31.5–36.0)
MCV: 87.8 fL (ref 79.5–101.0)
MONO#: 0.7 10*3/uL (ref 0.1–0.9)
MONO%: 10.6 % (ref 0.0–14.0)
NEUT%: 57.2 % (ref 38.4–76.8)
NEUTROS ABS: 3.5 10*3/uL (ref 1.5–6.5)
PLATELETS: 290 10*3/uL (ref 145–400)
RBC: 3.27 10*6/uL — ABNORMAL LOW (ref 3.70–5.45)
RDW: 12.1 % (ref 11.2–14.5)
WBC: 6.1 10*3/uL (ref 3.9–10.3)
lymph#: 1.9 10*3/uL (ref 0.9–3.3)

## 2016-05-11 ENCOUNTER — Other Ambulatory Visit (HOSPITAL_BASED_OUTPATIENT_CLINIC_OR_DEPARTMENT_OTHER): Payer: Medicare Other

## 2016-05-11 DIAGNOSIS — D471 Chronic myeloproliferative disease: Secondary | ICD-10-CM | POA: Diagnosis not present

## 2016-05-11 LAB — CBC WITH DIFFERENTIAL/PLATELET
BASO%: 1 % (ref 0.0–2.0)
BASOS ABS: 0.1 10*3/uL (ref 0.0–0.1)
EOS%: 1.1 % (ref 0.0–7.0)
Eosinophils Absolute: 0.1 10*3/uL (ref 0.0–0.5)
HEMATOCRIT: 31 % — AB (ref 34.8–46.6)
HGB: 10.4 g/dL — ABNORMAL LOW (ref 11.6–15.9)
LYMPH#: 1.8 10*3/uL (ref 0.9–3.3)
LYMPH%: 30.6 % (ref 14.0–49.7)
MCH: 30.6 pg (ref 25.1–34.0)
MCHC: 33.4 g/dL (ref 31.5–36.0)
MCV: 91.6 fL (ref 79.5–101.0)
MONO#: 0.6 10*3/uL (ref 0.1–0.9)
MONO%: 9.9 % (ref 0.0–14.0)
NEUT#: 3.4 10*3/uL (ref 1.5–6.5)
NEUT%: 57.4 % (ref 38.4–76.8)
Platelets: 378 10*3/uL (ref 145–400)
RBC: 3.38 10*6/uL — ABNORMAL LOW (ref 3.70–5.45)
RDW: 12.9 % (ref 11.2–14.5)
WBC: 5.9 10*3/uL (ref 3.9–10.3)

## 2016-05-20 NOTE — Progress Notes (Signed)
[place] OFFICE PROGRESS NOTE  [name], MD [place] [address]  DIAGNOSIS: Essential thrombocythemia   MPN (myeloproliferative neoplasm)  PROBLEM LIST: 1. Essential thrombocythemia with thrombocytosis going back to 2000. Mutations for JAK2 V617F, Exon 12, and MPL515 were negative. The patient had a bone marrow on 11/14/2009 which showed slightly hypercellular marrow for the patient's age, along with abundant iron stores. There was minimal reticulin fibrosis. Trichrome stains failed to show any definite collagenous fibrosis. The patient is maintained on hydroxyurea and aspirin since mid June 2011.  2. Right retinal artery occlusion and subsequent retinal infarct on 05/22/2009. The patient's vision has returned to near normal.  3. Peripheral vascular disease involving the right carotid artery detected 08/05/2009.  4. Hypertension  5. Allergic rhinitis  6. History of asthma  7. Adenomatous colonic polyps.  8. Diverticulosis  Chief complaint: Follow-up  PREVIOUS AND CURRENT THERAPY:  1.Hydrea 500 mg  Every day except 1,000 mg on M and Thurs. Decreased to 500mg  daily on 06/10/2015, and further decreased to 500mg  daily except M andThursday due to anemia, stopped on 07/30/2015 2. Added anagrelide 0.5 mg twice daily on 06/10/2015, increased to 1mg  am 0.5mg  pm on 07/30/2015, and decreased to 0.5mg  daily on 09/03/2015, increased to 1mg /day on 04/15/2016  3. Aspirin 325 mg daily.   INTERVAL HISTORY: Paula Griffith 70 y.o. female with presumed essential thrombocythemia currently maintained on aspirin and agrelide is here for follow-up. She is doing well overall. She has no concerns or complaints at this time. Admits her blood pressure may have been elevated today because she was very frustrated with a scheduling mishap.   MEDICAL HISTORY: Past Medical History:  Diagnosis Date  . Allergic rhinitis   . Asthma   . Colon polyps   . Diverticulosis of colon   .  Glucose intolerance (impaired glucose tolerance)   . HTN (hypertension)   . LBP (low back pain)   . Osteoarthritis   . PVD (peripheral vascular disease) ([place]) 2011   Right Carotid Dr Scot Dock  . 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 Thrombocytopathy [facility]) 2011   Dr Ralene Ok      ALLERGIES:  is allergic to benazepril; codeine; and tramadol.  MEDICATIONS: has a current medication list which includes the following prescription(s): albuterol, amlodipine, anagrelide, aspirin, biotin, budesonide-formoterol, cholecalciferol, cyclobenzaprine, gabapentin, ibuprofen, klor-con 10, loratadine, losartan-hydrochlorothiazide, meloxicam, and multivitamin.  SURGICAL HISTORY:  Past Surgical History:  Procedure Laterality Date  . ABDOMINAL HYSTERECTOMY  1989    REVIEW OF SYSTEMS:   Constitutional: Denies fevers, chills or abnormal weight loss Eyes: Denies blurriness of vision Ears, nose, mouth, throat, and face: Denies mucositis or sore throat Respiratory: Denies cough, dyspnea or wheezes Cardiovascular: Denies palpitation, chest discomfort or lower extremity swelling Gastrointestinal:  Denies nausea, heartburn or change in bowel habits Skin: Denies abnormal skin rashes Lymphatics: Denies new lymphadenopathy or easy bruising Neurological:Denies numbness, tingling or new weaknesses Behavioral/Psych: Mood is stable, no new changes  All other systems were reviewed with the patient and are negative.  PHYSICAL EXAMINATION: ECOG PERFORMANCE STATUS: 0 - Asymptomatic  Blood pressure (!) 165/60, pulse 86, temperature 98.2 F (36.8 C), temperature source Oral, resp. rate 17, height 5\' 5"  (1.651 m), weight 154 lb 11.2 oz (70.2 kg), SpO2 100 %.  GENERAL:alert, no distress and comfortable; well developed and well nourished. SKIN: skin color, texture, turgor are normal, no rashes or significant lesions EYES: normal, Conjunctiva are pink and non-injected, sclera clear OROPHARYNX:no exudate,  no erythema and  lips, buccal mucosa, and tongue normal  NECK: supple, thyroid normal size, non-tender, without nodularity LYMPH:  no palpable lymphadenopathy in the cervical, axillary or supraclavicular LUNGS: clear to auscultation and percussion with normal breathing effort HEART: regular rate & rhythm and no murmurs and no lower extremity edema ABDOMEN:abdomen soft, non-tender and normal bowel sounds Musculoskeletal:no cyanosis of digits and no clubbing ; R ankle larger than Left with slight tenderness to palpation.  NEURO: alert & oriented x 3 with fluent speech, no focal motor/sensory deficits   LABORATORY DATA: CBC Latest Ref Rng & Units 05/25/2016 05/11/2016 04/27/2016  WBC 3.9 - 10.3 10e3/uL 6.1 5.9 6.1  Hemoglobin 11.6 - 15.9 g/dL 10.0(L) 10.4(L) 10.1(L)  Hematocrit 34.8 - 46.6 % 28.8(L) 31.0(L) 28.7(L)  Platelets 145 - 400 10e3/uL 336 378 290    CMP Latest Ref Rng & Units 05/25/2016 02/19/2016 10/28/2015  Glucose 70 - 140 mg/dl 101 144(H) 132  BUN 7.0 - 26.0 mg/dL 12.1 14.5 15.1  Creatinine 0.6 - 1.1 mg/dL 0.8 0.9 1.1  Sodium 136 - 145 mEq/L 134(L) 137 139  Potassium 3.5 - 5.1 mEq/L 3.6 4.0 3.4(L)  Chloride 96 - 112 mEq/L - - -  CO2 22 - 29 mEq/L 24 26 25   Calcium 8.4 - 10.4 mg/dL 9.5 10.1 9.5  Total Protein 6.4 - 8.3 g/dL 6.8 7.4 7.0  Total Bilirubin 0.20 - 1.20 mg/dL 0.33 0.52 0.34  Alkaline Phos 40 - 150 U/L 67 68 49  AST 5 - 34 U/L 16 18 17   ALT 0-55 U/L U/L <6 <9 <9      RADIOGRAPHIC STUDIES: No results found.  ASSESSMENT: Paula Griffith 70 y.o. female with a history of Essential thrombocythemia    1. Myeloproliferative neoplasm-essential thrombocythemia, JAK2 mutation negative.  -We reviewed the natural history of ET, most patients do well well, major complication is thrombosis. -She was on Hydrea before, I stopped due to her anemia -Her anagrelide dose has been titrated, she is currently on 1.0 mg daily, platelet counts has been normal, 336K today, will continue anagrelide  to 0.5mg  twice daily  -I'll monitor her CBC every 6 weeks and I'll see her back in 3 months  2. Arthritis and back pain  -She will continue follow-up with her  PCP and pain management clinic. -Her pain is mild, and she has no limitation on her activities   3. Hypertension and other medical problems -She'll continue follow-up with her primary care physician  Plan -continue anagrelide 0.5 mg twice daily -Repeat a CBC every 6 weeks, I'll see her back in 3 months -she uses my chart to check her lab results, and she will call me if she notice abnormal platelet count   All questions were answered. The patient knows to call the clinic with any problems, questions or concerns. We can certainly see the patient much sooner if necessary.  I spent 15 minutes counseling the patient face to face. The total time spent in the appointment was 20 minutes.  This document serves as a record of services personally performed by Truitt Merle, MD. It was created on her behalf by Arlyce Harman, a trained medical scribe. The creation of this record is based on the scribe's personal observations and the provider's statements to them. This document has been checked and approved by the attending provider.    Truitt Merle, MD 05/25/16

## 2016-05-25 ENCOUNTER — Ambulatory Visit (HOSPITAL_BASED_OUTPATIENT_CLINIC_OR_DEPARTMENT_OTHER): Payer: Medicare Other | Admitting: Hematology

## 2016-05-25 ENCOUNTER — Encounter: Payer: Self-pay | Admitting: Hematology

## 2016-05-25 ENCOUNTER — Telehealth: Payer: Self-pay | Admitting: Hematology

## 2016-05-25 ENCOUNTER — Other Ambulatory Visit (HOSPITAL_BASED_OUTPATIENT_CLINIC_OR_DEPARTMENT_OTHER): Payer: Medicare Other

## 2016-05-25 VITALS — BP 165/60 | HR 86 | Temp 98.2°F | Resp 17 | Ht 65.0 in | Wt 154.7 lb

## 2016-05-25 DIAGNOSIS — D473 Essential (hemorrhagic) thrombocythemia: Secondary | ICD-10-CM

## 2016-05-25 DIAGNOSIS — I10 Essential (primary) hypertension: Secondary | ICD-10-CM

## 2016-05-25 DIAGNOSIS — M549 Dorsalgia, unspecified: Secondary | ICD-10-CM

## 2016-05-25 DIAGNOSIS — M1990 Unspecified osteoarthritis, unspecified site: Secondary | ICD-10-CM | POA: Diagnosis not present

## 2016-05-25 DIAGNOSIS — D471 Chronic myeloproliferative disease: Secondary | ICD-10-CM

## 2016-05-25 DIAGNOSIS — D649 Anemia, unspecified: Secondary | ICD-10-CM

## 2016-05-25 LAB — COMPREHENSIVE METABOLIC PANEL
ALBUMIN: 3.6 g/dL (ref 3.5–5.0)
AST: 16 U/L (ref 5–34)
Alkaline Phosphatase: 67 U/L (ref 40–150)
Anion Gap: 11 mEq/L (ref 3–11)
BILIRUBIN TOTAL: 0.33 mg/dL (ref 0.20–1.20)
BUN: 12.1 mg/dL (ref 7.0–26.0)
CO2: 24 meq/L (ref 22–29)
CREATININE: 0.8 mg/dL (ref 0.6–1.1)
Calcium: 9.5 mg/dL (ref 8.4–10.4)
Chloride: 99 mEq/L (ref 98–109)
EGFR: 82 mL/min/{1.73_m2} — AB (ref >90)
GLUCOSE: 101 mg/dL (ref 70–140)
Potassium: 3.6 mEq/L (ref 3.5–5.1)
SODIUM: 134 meq/L — AB (ref 136–145)
TOTAL PROTEIN: 6.8 g/dL (ref 6.4–8.3)

## 2016-05-25 LAB — CBC WITH DIFFERENTIAL/PLATELET
BASO%: 0.5 % (ref 0.0–2.0)
Basophils Absolute: 0 10*3/uL (ref 0.0–0.1)
EOS%: 0.5 % (ref 0.0–7.0)
Eosinophils Absolute: 0 10*3/uL (ref 0.0–0.5)
HCT: 28.8 % — ABNORMAL LOW (ref 34.8–46.6)
HEMOGLOBIN: 10 g/dL — AB (ref 11.6–15.9)
LYMPH%: 27.2 % (ref 14.0–49.7)
MCH: 30.6 pg (ref 25.1–34.0)
MCHC: 34.7 g/dL (ref 31.5–36.0)
MCV: 88.1 fL (ref 79.5–101.0)
MONO#: 0.7 10*3/uL (ref 0.1–0.9)
MONO%: 10.8 % (ref 0.0–14.0)
NEUT%: 61 % (ref 38.4–76.8)
NEUTROS ABS: 3.7 10*3/uL (ref 1.5–6.5)
Platelets: 336 10*3/uL (ref 145–400)
RBC: 3.27 10*6/uL — AB (ref 3.70–5.45)
RDW: 12.5 % (ref 11.2–14.5)
WBC: 6.1 10*3/uL (ref 3.9–10.3)
lymph#: 1.7 10*3/uL (ref 0.9–3.3)

## 2016-05-25 NOTE — Telephone Encounter (Signed)
Appointments scheduled per 11/27 LOS. Patient given AVS report and calendars with future scheduled appointments. °

## 2016-06-09 ENCOUNTER — Other Ambulatory Visit: Payer: Self-pay | Admitting: Hematology

## 2016-06-09 DIAGNOSIS — D471 Chronic myeloproliferative disease: Secondary | ICD-10-CM

## 2016-06-11 ENCOUNTER — Other Ambulatory Visit: Payer: Self-pay | Admitting: *Deleted

## 2016-06-11 ENCOUNTER — Telehealth: Payer: Self-pay | Admitting: *Deleted

## 2016-06-11 DIAGNOSIS — D471 Chronic myeloproliferative disease: Secondary | ICD-10-CM

## 2016-06-11 MED ORDER — ANAGRELIDE HCL 0.5 MG PO CAPS: 0.5000 mg | ORAL_CAPSULE | ORAL | 2 refills | Status: DC

## 2016-06-11 NOTE — Telephone Encounter (Signed)
Received call from pt and [facility] requesting refills of Anagrelide.  Spoke with pt and clarified re:  Pt takes  Anagrelide  0.5 mg  BID on  M , W, and F. Anagrelide  0.5 mg on Tues, Thurs, Sat and  "Sunday. Script sent to [facility]. Pt's    Phone      33" [phone].

## 2016-06-21 ENCOUNTER — Other Ambulatory Visit: Payer: Self-pay | Admitting: Internal Medicine

## 2016-06-24 ENCOUNTER — Other Ambulatory Visit: Payer: Self-pay | Admitting: Internal Medicine

## 2016-07-08 ENCOUNTER — Other Ambulatory Visit (HOSPITAL_BASED_OUTPATIENT_CLINIC_OR_DEPARTMENT_OTHER): Payer: Medicare Other

## 2016-07-08 DIAGNOSIS — D471 Chronic myeloproliferative disease: Secondary | ICD-10-CM

## 2016-07-08 LAB — CBC WITH DIFFERENTIAL/PLATELET
BASO%: 0.6 % (ref 0.0–2.0)
BASOS ABS: 0 10*3/uL (ref 0.0–0.1)
EOS ABS: 0.1 10*3/uL (ref 0.0–0.5)
EOS%: 1.5 % (ref 0.0–7.0)
HCT: 29.8 % — ABNORMAL LOW (ref 34.8–46.6)
HGB: 10.2 g/dL — ABNORMAL LOW (ref 11.6–15.9)
LYMPH%: 29.8 % (ref 14.0–49.7)
MCH: 30.6 pg (ref 25.1–34.0)
MCHC: 34.2 g/dL (ref 31.5–36.0)
MCV: 89.5 fL (ref 79.5–101.0)
MONO#: 0.4 10*3/uL (ref 0.1–0.9)
MONO%: 7.4 % (ref 0.0–14.0)
NEUT#: 3.3 10*3/uL (ref 1.5–6.5)
NEUT%: 60.7 % (ref 38.4–76.8)
NRBC: 0 % (ref 0–0)
PLATELETS: 306 10*3/uL (ref 145–400)
RBC: 3.33 10*6/uL — AB (ref 3.70–5.45)
RDW: 12.4 % (ref 11.2–14.5)
WBC: 5.4 10*3/uL (ref 3.9–10.3)
lymph#: 1.6 10*3/uL (ref 0.9–3.3)

## 2016-08-05 ENCOUNTER — Other Ambulatory Visit: Payer: Self-pay | Admitting: Internal Medicine

## 2016-08-24 ENCOUNTER — Ambulatory Visit (INDEPENDENT_AMBULATORY_CARE_PROVIDER_SITE_OTHER): Payer: Medicare Other | Admitting: Internal Medicine

## 2016-08-24 ENCOUNTER — Encounter: Payer: Self-pay | Admitting: Internal Medicine

## 2016-08-24 VITALS — BP 162/72 | HR 74 | Temp 98.9°F | Resp 16 | Ht 65.0 in | Wt 156.0 lb

## 2016-08-24 DIAGNOSIS — D471 Chronic myeloproliferative disease: Secondary | ICD-10-CM | POA: Diagnosis not present

## 2016-08-24 DIAGNOSIS — D649 Anemia, unspecified: Secondary | ICD-10-CM | POA: Diagnosis not present

## 2016-08-24 DIAGNOSIS — F439 Reaction to severe stress, unspecified: Secondary | ICD-10-CM | POA: Diagnosis not present

## 2016-08-24 DIAGNOSIS — Z23 Encounter for immunization: Secondary | ICD-10-CM

## 2016-08-24 DIAGNOSIS — J4531 Mild persistent asthma with (acute) exacerbation: Secondary | ICD-10-CM

## 2016-08-24 DIAGNOSIS — I10 Essential (primary) hypertension: Secondary | ICD-10-CM | POA: Diagnosis not present

## 2016-08-24 MED ORDER — IBUPROFEN 600 MG PO TABS: 600.0000 mg | ORAL_TABLET | Freq: Two times a day (BID) | ORAL | 1 refills | Status: DC | PRN

## 2016-08-24 NOTE — Assessment & Plan Note (Signed)
On Symbicort

## 2016-08-24 NOTE — Assessment & Plan Note (Signed)
Labs w/Hem this week

## 2016-08-24 NOTE — Assessment & Plan Note (Signed)
Losartan, Amlodipine HCT

## 2016-08-24 NOTE — Addendum Note (Signed)
Addended by: Valere Dross on: 08/24/2016 09:29 AM   Modules accepted: Orders

## 2016-08-24 NOTE — Assessment & Plan Note (Signed)
Labs
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
## 2016-08-24 NOTE — Progress Notes (Signed)
Pre-visit discussion using our clinic review tool. No additional management support is needed unless otherwise documented below in the visit note.  

## 2016-08-25 NOTE — Progress Notes (Signed)
[facility] OFFICE PROGRESS NOTE  [name], [place] [id number]  DIAGNOSIS: Essential thrombocythemia   MPN (myeloproliferative neoplasm)  PROBLEM LIST: 1. Essential thrombocythemia with thrombocytosis going back to 2000. Mutations for JAK2 V617F, Exon 12, and MPL515 were negative. The patient had a bone marrow on 11/14/2009 which showed slightly hypercellular marrow for the patient's age, along with abundant iron stores. There was minimal reticulin fibrosis. Trichrome stains failed to show any definite collagenous fibrosis. The patient is maintained on hydroxyurea and aspirin since mid June 2011.  2. Right retinal artery occlusion and subsequent retinal infarct on 05/22/2009. The patient's vision has returned to near normal.  3. Peripheral vascular disease involving the right carotid artery detected 08/05/2009.  4. Hypertension  5. Allergic rhinitis  6. History of asthma  7. Adenomatous colonic polyps.  8. Diverticulosis  Chief complaint: Follow-up  PREVIOUS AND CURRENT THERAPY:  1.Hydrea 500 mg  Every day except 1,000 mg on M and Thurs. Decreased to 500mg  daily on 06/10/2015, and further decreased to 500mg  daily except M andThursday due to anemia, stopped on 07/30/2015 2. Added anagrelide 0.5 mg twice daily on 06/10/2015, increased to 1mg  am 0.5mg  pm on 07/30/2015, and decreased to 0.5mg  daily on 09/03/2015, changed to anagrelide 0.5 mg twice daily on MWF, and 0.5mg  daily on the rest of week in 03/2016  3. Aspirin 325 mg daily.   INTERVAL HISTORY: ITZAMAR Griffith 71 y.o. female with presumed essential thrombocythemia currently maintained on aspirin and agrelide is here for follow-up. The patient reports she is feeling well overall. 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 She reports she is not eating meat at this time, and her appetite has been reduced. She is taking multivitamins regularly.     MEDICAL HISTORY: Past Medical History:  Diagnosis Date  . Allergic rhinitis   . Asthma   . Colon polyps   . Diverticulosis of colon   . Glucose intolerance (impaired glucose tolerance)   . HTN (hypertension)   . LBP (low back pain)   . Osteoarthritis   . PVD (peripheral vascular disease) ([place]) 2011   Right Carotid Dr Scot Dock  . 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 Thrombocytopathy [facility]) 2011   Dr Ralene Ok      ALLERGIES:  is allergic to benazepril; codeine; and tramadol.  MEDICATIONS: has a current medication list which includes the following prescription(s): albuterol, amlodipine, anagrelide, vitamin c, aspirin, biotin, budesonide-formoterol, cholecalciferol, gabapentin, ibuprofen, klor-con 10, losartan-hydrochlorothiazide, and multivitamin.  SURGICAL HISTORY:  Past Surgical History:  Procedure Laterality Date  . ABDOMINAL HYSTERECTOMY  1989    REVIEW OF SYSTEMS:   Constitutional: Denies fevers, chills or abnormal weight loss, (+) mildly decreased appetite Eyes: Denies blurriness of vision Ears, nose, mouth, throat, and face: Denies mucositis or sore throat Respiratory: Denies cough, dyspnea or wheezes Cardiovascular: Denies palpitation, chest discomfort or lower extremity swelling Gastrointestinal:  Denies nausea, heartburn or change in bowel habits Skin: Denies abnormal skin rashes Lymphatics: Denies new lymphadenopathy or easy bruising Neurological:Denies numbness, tingling or new weaknesses Behavioral/Psych: Mood is stable, no new changes  All other systems were reviewed with the patient and are negative.  PHYSICAL EXAMINATION: ECOG PERFORMANCE STATUS: 0 - Asymptomatic  Blood pressure (!) 159/70, pulse 87, temperature 98.4 F (36.9 C), temperature source Oral, resp. rate 18, height 5\' 5"  (1.651 m), weight 154 lb 11.2 oz (70.2 kg), SpO2 100 %.  GENERAL:alert,  no distress and comfortable; well developed and well nourished. SKIN: skin color, texture, turgor are  normal, no rashes or significant lesions EYES: normal, Conjunctiva are pink and non-injected, sclera clear OROPHARYNX:no exudate, no erythema and lips, buccal mucosa, and tongue normal  NECK: supple, thyroid normal size, non-tender, without nodularity LYMPH:  no palpable lymphadenopathy in the cervical, axillary or supraclavicular LUNGS: clear to auscultation and percussion with normal breathing effort HEART: regular rate & rhythm and no murmurs and no lower extremity edema ABDOMEN:abdomen soft, non-tender and normal bowel sounds Musculoskeletal:no cyanosis of digits and no clubbing ; R ankle larger than Left with slight tenderness to palpation.  NEURO: alert & oriented x 3 with fluent speech, no focal motor/sensory deficits.    LABORATORY DATA: CBC Latest Ref Rng & Units 08/26/2016 07/08/2016 05/25/2016  WBC 3.9 - 10.3 10e3/uL 4.6 5.4 6.1  Hemoglobin 11.6 - 15.9 g/dL 10.8(L) 10.2(L) 10.0(L)  Hematocrit 34.8 - 46.6 % 32.6(L) 29.8(L) 28.8(L)  Platelets 145 - 400 10e3/uL 320 306 336    CMP Latest Ref Rng & Units 08/26/2016 05/25/2016 02/19/2016  Glucose 70 - 140 mg/dl 118 101 144(H)  BUN 7.0 - 26.0 mg/dL 5.9(L) 12.1 14.5  Creatinine 0.6 - 1.1 mg/dL 0.9 0.8 0.9  Sodium 136 - 145 mEq/L 133(L) 134(L) 137  Potassium 3.5 - 5.1 mEq/L 4.0 3.6 4.0  Chloride 96 - 112 mEq/L - - -  CO2 22 - 29 mEq/L 24 24 26   Calcium 8.4 - 10.4 mg/dL 9.7 9.5 10.1  Total Protein 6.4 - 8.3 g/dL 7.1 6.8 7.4  Total Bilirubin 0.20 - 1.20 mg/dL 0.40 0.33 0.52  Alkaline Phos 40 - 150 U/L 57 67 68  AST 5 - 34 U/L 17 16 18   ALT 0 - 55 U/L 8 <6 <9      RADIOGRAPHIC STUDIES: No results found.  ASSESSMENT: Paula Griffith 71 y.o. female with a history of Essential thrombocythemia    1. Myeloproliferative neoplasm-essential thrombocythemia, JAK2 mutation negative.  -We previously reviewed the natural history of ET, most patients do well well, major complication is thrombosis. -She was on Hydrea before, I stopped due  to her anemia. -Anemia has since improved. -Her anagrelide dose has been titrated, she is currently on anagrelide 0.5 mg twice daily on MWF, and 0.5mg  daily on the rest of week., platelet counts has been normal, 320K today, will continue anagrelide to 0.5mg  twice daily  -I'll monitor her CBC every 2 months and I'll see her back in 6 months.  2. Arthritis and back pain  -She will continue follow-up with her PCP and pain management clinic. -Her pain is mild, and she has no limitation on her activities   3. Hypertension and other medical problems -She'll continue follow-up with her primary care physician  Plan -Reviewed the patient's labs. -continue anagrelide 0.5 mg twice daily on MWF, and 0.5mg  daily on the rest of week. -Repeat a CBC every 2 months, I'll see her back in 6 months. -she will call me if she notices abnormal platelet count   All questions were answered. The patient knows to call the clinic with any problems, questions or concerns. We can certainly see the patient much sooner if necessary.  I spent 15 minutes counseling the patient face to face. The total time spent in the appointment was 20 minutes.  This document serves as a record of services personally performed by Truitt Merle, MD. It was created on her behalf by Maryla Morrow, a trained medical scribe. The creation  of this record is based on the scribe's personal observations and the provider's statements to them. This document has been checked and approved by the attending provider.   Truitt Merle, MD 08/26/16

## 2016-08-26 ENCOUNTER — Encounter: Payer: Self-pay | Admitting: Hematology

## 2016-08-26 ENCOUNTER — Ambulatory Visit (HOSPITAL_BASED_OUTPATIENT_CLINIC_OR_DEPARTMENT_OTHER): Payer: Medicare Other | Admitting: Hematology

## 2016-08-26 ENCOUNTER — Other Ambulatory Visit (HOSPITAL_BASED_OUTPATIENT_CLINIC_OR_DEPARTMENT_OTHER): Payer: Medicare Other

## 2016-08-26 ENCOUNTER — Telehealth: Payer: Self-pay | Admitting: Hematology

## 2016-08-26 VITALS — BP 159/70 | HR 87 | Temp 98.4°F | Resp 18 | Ht 65.0 in | Wt 154.7 lb

## 2016-08-26 DIAGNOSIS — D471 Chronic myeloproliferative disease: Secondary | ICD-10-CM

## 2016-08-26 DIAGNOSIS — D473 Essential (hemorrhagic) thrombocythemia: Secondary | ICD-10-CM | POA: Diagnosis not present

## 2016-08-26 DIAGNOSIS — I10 Essential (primary) hypertension: Secondary | ICD-10-CM

## 2016-08-26 DIAGNOSIS — M1990 Unspecified osteoarthritis, unspecified site: Secondary | ICD-10-CM

## 2016-08-26 DIAGNOSIS — M549 Dorsalgia, unspecified: Secondary | ICD-10-CM | POA: Diagnosis not present

## 2016-08-26 LAB — CBC WITH DIFFERENTIAL/PLATELET
BASO%: 0.5 % (ref 0.0–2.0)
Basophils Absolute: 0 10*3/uL (ref 0.0–0.1)
EOS ABS: 0.1 10*3/uL (ref 0.0–0.5)
EOS%: 1.4 % (ref 0.0–7.0)
HEMATOCRIT: 32.6 % — AB (ref 34.8–46.6)
HEMOGLOBIN: 10.8 g/dL — AB (ref 11.6–15.9)
LYMPH%: 23.8 % (ref 14.0–49.7)
MCH: 31 pg (ref 25.1–34.0)
MCHC: 33.2 g/dL (ref 31.5–36.0)
MCV: 93.4 fL (ref 79.5–101.0)
MONO#: 0.4 10*3/uL (ref 0.1–0.9)
MONO%: 9.4 % (ref 0.0–14.0)
NEUT%: 64.9 % (ref 38.4–76.8)
NEUTROS ABS: 3 10*3/uL (ref 1.5–6.5)
PLATELETS: 320 10*3/uL (ref 145–400)
RBC: 3.49 10*6/uL — ABNORMAL LOW (ref 3.70–5.45)
RDW: 12.1 % (ref 11.2–14.5)
WBC: 4.6 10*3/uL (ref 3.9–10.3)
lymph#: 1.1 10*3/uL (ref 0.9–3.3)

## 2016-08-26 LAB — COMPREHENSIVE METABOLIC PANEL
ALBUMIN: 4 g/dL (ref 3.5–5.0)
ALK PHOS: 57 U/L (ref 40–150)
ALT: 8 U/L (ref 0–55)
ANION GAP: 10 meq/L (ref 3–11)
AST: 17 U/L (ref 5–34)
BILIRUBIN TOTAL: 0.4 mg/dL (ref 0.20–1.20)
BUN: 5.9 mg/dL — ABNORMAL LOW (ref 7.0–26.0)
CO2: 24 mEq/L (ref 22–29)
Calcium: 9.7 mg/dL (ref 8.4–10.4)
Chloride: 99 mEq/L (ref 98–109)
Creatinine: 0.9 mg/dL (ref 0.6–1.1)
EGFR: 78 mL/min/{1.73_m2} — AB (ref >90)
GLUCOSE: 118 mg/dL (ref 70–140)
Potassium: 4 mEq/L (ref 3.5–5.1)
SODIUM: 133 meq/L — AB (ref 136–145)
TOTAL PROTEIN: 7.1 g/dL (ref 6.4–8.3)

## 2016-08-26 NOTE — Telephone Encounter (Signed)
Appointments scheduled per 08/26/16 los. Patient was given a copy of the AVS report and appointment schedule per 08/26/16 los. °

## 2016-09-26 ENCOUNTER — Other Ambulatory Visit: Payer: Self-pay | Admitting: Internal Medicine

## 2016-10-23 ENCOUNTER — Other Ambulatory Visit (HOSPITAL_BASED_OUTPATIENT_CLINIC_OR_DEPARTMENT_OTHER): Payer: Medicare Other

## 2016-10-23 DIAGNOSIS — D471 Chronic myeloproliferative disease: Secondary | ICD-10-CM

## 2016-10-23 LAB — CBC WITH DIFFERENTIAL/PLATELET
BASO%: 0.8 % (ref 0.0–2.0)
Basophils Absolute: 0 10*3/uL (ref 0.0–0.1)
EOS%: 0.9 % (ref 0.0–7.0)
Eosinophils Absolute: 0 10*3/uL (ref 0.0–0.5)
HCT: 31.6 % — ABNORMAL LOW (ref 34.8–46.6)
HEMOGLOBIN: 10.9 g/dL — AB (ref 11.6–15.9)
LYMPH#: 1.4 10*3/uL (ref 0.9–3.3)
LYMPH%: 28.1 % (ref 14.0–49.7)
MCH: 31.6 pg (ref 25.1–34.0)
MCHC: 34.5 g/dL (ref 31.5–36.0)
MCV: 91.7 fL (ref 79.5–101.0)
MONO#: 0.5 10*3/uL (ref 0.1–0.9)
MONO%: 10.6 % (ref 0.0–14.0)
NEUT#: 2.9 10*3/uL (ref 1.5–6.5)
NEUT%: 59.6 % (ref 38.4–76.8)
Platelets: 274 10*3/uL (ref 145–400)
RBC: 3.44 10*6/uL — ABNORMAL LOW (ref 3.70–5.45)
RDW: 12.1 % (ref 11.2–14.5)
WBC: 4.9 10*3/uL (ref 3.9–10.3)

## 2016-11-06 ENCOUNTER — Other Ambulatory Visit: Payer: Self-pay | Admitting: *Deleted

## 2016-11-06 MED ORDER — GABAPENTIN 100 MG PO CAPS: ORAL_CAPSULE | ORAL | 5 refills | Status: DC

## 2016-11-10 ENCOUNTER — Other Ambulatory Visit: Payer: Self-pay | Admitting: *Deleted

## 2016-11-10 DIAGNOSIS — D471 Chronic myeloproliferative disease: Secondary | ICD-10-CM

## 2016-11-10 MED ORDER — ANAGRELIDE HCL 0.5 MG PO CAPS: 0.5000 mg | ORAL_CAPSULE | ORAL | 2 refills | Status: DC

## 2016-11-17 MED ORDER — GABAPENTIN 100 MG PO CAPS: ORAL_CAPSULE | ORAL | 0 refills | Status: DC

## 2016-11-17 NOTE — Telephone Encounter (Signed)
Rec'd fax stating pt is requesting 90 day script for her Gabapentin due to insurance. Sent rx for 90 day...Johny Chess

## 2016-11-17 NOTE — Addendum Note (Signed)
Addended by: Earnstine Regal on: 11/17/2016 12:03 PM   Modules accepted: Orders

## 2016-12-24 ENCOUNTER — Other Ambulatory Visit (HOSPITAL_BASED_OUTPATIENT_CLINIC_OR_DEPARTMENT_OTHER): Payer: Medicare Other

## 2016-12-24 DIAGNOSIS — D471 Chronic myeloproliferative disease: Secondary | ICD-10-CM

## 2016-12-24 LAB — CBC WITH DIFFERENTIAL/PLATELET
BASO%: 0.8 % (ref 0.0–2.0)
Basophils Absolute: 0 10*3/uL (ref 0.0–0.1)
EOS%: 2.3 % (ref 0.0–7.0)
Eosinophils Absolute: 0.1 10*3/uL (ref 0.0–0.5)
HCT: 31.4 % — ABNORMAL LOW (ref 34.8–46.6)
HEMOGLOBIN: 10.5 g/dL — AB (ref 11.6–15.9)
LYMPH%: 26.5 % (ref 14.0–49.7)
MCH: 31.1 pg (ref 25.1–34.0)
MCHC: 33.4 g/dL (ref 31.5–36.0)
MCV: 93 fL (ref 79.5–101.0)
MONO#: 0.5 10*3/uL (ref 0.1–0.9)
MONO%: 9.2 % (ref 0.0–14.0)
NEUT%: 61.2 % (ref 38.4–76.8)
NEUTROS ABS: 3.2 10*3/uL (ref 1.5–6.5)
Platelets: 305 10*3/uL (ref 145–400)
RBC: 3.38 10*6/uL — ABNORMAL LOW (ref 3.70–5.45)
RDW: 12.2 % (ref 11.2–14.5)
WBC: 5.2 10*3/uL (ref 3.9–10.3)
lymph#: 1.4 10*3/uL (ref 0.9–3.3)

## 2017-02-01 ENCOUNTER — Other Ambulatory Visit: Payer: Self-pay | Admitting: Internal Medicine

## 2017-02-01 DIAGNOSIS — Z1231 Encounter for screening mammogram for malignant neoplasm of breast: Secondary | ICD-10-CM

## 2017-02-09 ENCOUNTER — Ambulatory Visit: Payer: Medicare Other

## 2017-02-16 ENCOUNTER — Ambulatory Visit
Admission: RE | Admit: 2017-02-16 | Discharge: 2017-02-16 | Disposition: A | Discharge status: Home / Self Care | Payer: Medicare Other | Source: Ambulatory Visit | Attending: Internal Medicine | Admitting: Internal Medicine

## 2017-02-16 DIAGNOSIS — Z1231 Encounter for screening mammogram for malignant neoplasm of breast: Secondary | ICD-10-CM

## 2017-02-23 ENCOUNTER — Ambulatory Visit (INDEPENDENT_AMBULATORY_CARE_PROVIDER_SITE_OTHER): Payer: Medicare Other | Admitting: Internal Medicine

## 2017-02-23 ENCOUNTER — Encounter: Payer: Self-pay | Admitting: Internal Medicine

## 2017-02-23 VITALS — BP 126/72 | HR 65 | Temp 98.5°F | Ht 65.0 in | Wt 148.0 lb

## 2017-02-23 DIAGNOSIS — Z23 Encounter for immunization: Secondary | ICD-10-CM

## 2017-02-23 DIAGNOSIS — I10 Essential (primary) hypertension: Secondary | ICD-10-CM

## 2017-02-23 DIAGNOSIS — R739 Hyperglycemia, unspecified: Secondary | ICD-10-CM

## 2017-02-23 DIAGNOSIS — Z0000 Encounter for general adult medical examination without abnormal findings: Secondary | ICD-10-CM | POA: Diagnosis not present

## 2017-02-23 DIAGNOSIS — D471 Chronic myeloproliferative disease: Secondary | ICD-10-CM

## 2017-02-23 DIAGNOSIS — J4531 Mild persistent asthma with (acute) exacerbation: Secondary | ICD-10-CM

## 2017-02-23 NOTE — Assessment & Plan Note (Signed)
Losartan HCT, Amlodipine 

## 2017-02-23 NOTE — Assessment & Plan Note (Signed)
Labs

## 2017-02-23 NOTE — Addendum Note (Signed)
Addended by: Karren Cobble on: 02/23/2017 09:20 AM   Modules accepted: Orders

## 2017-02-23 NOTE — Progress Notes (Signed)
Subjective:  Patient ID: Paula Griffith, female    DOB: 09-07-1945  Age: 71 y.o. MRN: [id number]  CC: No chief complaint on file.   HPI Paula Griffith presents for a well exam  Outpatient Medications Prior to Visit  Medication Sig Dispense Refill  . albuterol (PROAIR HFA) 108 (90 Base) MCG/ACT inhaler Inhale 2 puffs into the lungs every 4 (four) hours as needed. For shortness of breath 1 Inhaler 3  . amLODipine (NORVASC) 5 MG tablet TAKE 1 TABLET BY MOUTH EVERY DAY 90 tablet 2  . anagrelide (AGRYLIN) 0.5 MG capsule Take 1 capsule (0.5 mg total) by mouth as directed. Take  0.5 mg  Twice daily on  M, W, and F.  Take 0.5 mg daily all other days. 90 capsule 2  . Ascorbic Acid (VITAMIN C) 1000 MG tablet Take 1,000 mg by mouth daily.    Marland Kitchen aspirin 325 MG EC tablet Take 325 mg by mouth daily after breakfast.      . Biotin (BIOTIN MAXIMUM STRENGTH) 10 MG TABS Take by mouth daily.    . budesonide-formoterol (SYMBICORT) 80-4.5 MCG/ACT inhaler Inhale 2 puffs into the lungs 2 (two) times daily. 10.2 g 1  . Cholecalciferol 1000 UNITS tablet Take 1,000 Units by mouth daily.      Marland Kitchen gabapentin (NEURONTIN) 100 MG capsule TAKE 1 CAPSULE (100 MG TOTAL) BY MOUTH 3 (THREE) TIMES DAILY. 270 capsule 0  . ibuprofen (ADVIL,MOTRIN) 600 MG tablet Take 1 tablet (600 mg total) by mouth 2 (two) times daily as needed. 180 tablet 1  . KLOR-CON 10 10 MEQ tablet TAKE 1 TABLET BY MOUTH ONCE DAILY 90 tablet 1  . losartan-hydrochlorothiazide (HYZAAR) 100-25 MG tablet TAKE 1 TABLET BY MOUTH DAILY. 90 tablet 3  . Multiple Vitamin (MULTIVITAMIN) capsule Take 2 capsules by mouth daily.      No facility-administered medications prior to visit.     ROS Review of Systems  Constitutional: Negative for activity change, appetite change, chills, fatigue and unexpected weight change.  HENT: Negative for congestion, mouth sores and sinus pressure.   Eyes: Negative for visual disturbance.  Respiratory: Negative for cough and chest  tightness.   Gastrointestinal: Negative for abdominal pain and nausea.  Genitourinary: Negative for difficulty urinating, frequency and vaginal pain.  Musculoskeletal: Positive for back pain. Negative for gait problem.  Skin: Negative for pallor and rash.  Neurological: Negative for dizziness, tremors, weakness, numbness and headaches.  Psychiatric/Behavioral: Negative for confusion and sleep disturbance.    Objective:  BP 126/72 (BP Location: Left Arm, Patient Position: Sitting, Cuff Size: Large)   Pulse 65   Temp 98.5 F (36.9 C) (Oral)   Ht 5\' 5"  (1.651 m)   Wt 148 lb (67.1 kg)   SpO2 99%   BMI 24.63 kg/m   BP Readings from Last 3 Encounters:  02/23/17 126/72  08/26/16 (!) 159/70  08/24/16 (!) 162/72    Wt Readings from Last 3 Encounters:  02/23/17 148 lb (67.1 kg)  08/26/16 154 lb 11.2 oz (70.2 kg)  08/24/16 156 lb (70.8 kg)    Physical Exam  Constitutional: She appears well-developed. No distress.  HENT:  Head: Normocephalic.  Right Ear: External ear normal.  Left Ear: External ear normal.  Nose: Nose normal.  Mouth/Throat: Oropharynx is clear and moist.  Eyes: Pupils are equal, round, and reactive to light. Conjunctivae are normal. Right eye exhibits no discharge. Left eye exhibits no discharge.  Neck: Normal range of motion. Neck supple. No JVD  present. No tracheal deviation present. No thyromegaly present.  Cardiovascular: Normal rate and regular rhythm.   Murmur heard. Pulmonary/Chest: No stridor. No respiratory distress. She has no wheezes.  Abdominal: Soft. Bowel sounds are normal. She exhibits no distension and no mass. There is no tenderness. There is no rebound and no guarding.  Musculoskeletal: She exhibits no edema or tenderness.  Lymphadenopathy:    She has no cervical adenopathy.  Neurological: She displays normal reflexes. No cranial nerve deficit. She exhibits normal muscle tone. Coordination normal.  Skin: No rash noted. No erythema.    Psychiatric: She has a normal mood and affect. Her behavior is normal. Judgment and thought content normal.     Lab Results  Component Value Date   WBC 5.2 12/24/2016   HGB 10.5 (L) 12/24/2016   HCT 31.4 (L) 12/24/2016   PLT 305 12/24/2016   GLUCOSE 118 08/26/2016   CHOL 133 10/25/2015   TRIG 103.0 10/25/2015   HDL 35.70 (L) 10/25/2015   LDLCALC 77 10/25/2015   ALT 8 08/26/2016   AST 17 08/26/2016   NA 133 (L) 08/26/2016   K 4.0 08/26/2016   CL 98 10/25/2015   CREATININE 0.9 08/26/2016   BUN 5.9 (L) 08/26/2016   CO2 24 08/26/2016   TSH 1.24 10/25/2015   HGBA1C 6.1 10/25/2015    Mm Screening Breast Tomo Bilateral  Result Date: 02/17/2017 CLINICAL DATA:  Screening. EXAM: 2D DIGITAL SCREENING BILATERAL MAMMOGRAM WITH CAD AND ADJUNCT TOMO COMPARISON:  Previous exam(s). ACR Breast Density Category b: There are scattered areas of fibroglandular density. FINDINGS: There are no findings suspicious for malignancy. Images were processed with CAD. IMPRESSION: No mammographic evidence of malignancy. A result letter of this screening mammogram will be mailed directly to the patient. RECOMMENDATION: Screening mammogram in one year. (Code:SM-B-01Y) BI-RADS CATEGORY  1: Negative. Electronically Signed   By: Lajean Manes M.D.   On: 02/17/2017 13:00    Assessment & Plan:   There are no diagnoses linked to this encounter. I am having Ms. Kilts maintain her aspirin, Cholecalciferol, multivitamin, Biotin, losartan-hydrochlorothiazide, albuterol, budesonide-formoterol, KLOR-CON 10, ibuprofen, vitamin C, amLODipine, anagrelide, and gabapentin.  No orders of the defined types were placed in this encounter.    Follow-up: No Follow-up on file.  [name], MD

## 2017-02-23 NOTE — Assessment & Plan Note (Signed)
F/u w/Dr Feng 

## 2017-02-23 NOTE — Assessment & Plan Note (Signed)

## 2017-02-23 NOTE — Assessment & Plan Note (Signed)
Doing well 

## 2017-02-24 ENCOUNTER — Telehealth: Payer: Self-pay | Admitting: Hematology

## 2017-02-24 ENCOUNTER — Ambulatory Visit (HOSPITAL_BASED_OUTPATIENT_CLINIC_OR_DEPARTMENT_OTHER): Payer: Medicare Other | Admitting: Hematology

## 2017-02-24 ENCOUNTER — Other Ambulatory Visit (HOSPITAL_BASED_OUTPATIENT_CLINIC_OR_DEPARTMENT_OTHER): Payer: Medicare Other

## 2017-02-24 VITALS — BP 143/62 | HR 67 | Temp 99.0°F | Resp 18 | Ht 65.0 in | Wt 147.9 lb

## 2017-02-24 DIAGNOSIS — M549 Dorsalgia, unspecified: Secondary | ICD-10-CM

## 2017-02-24 DIAGNOSIS — I10 Essential (primary) hypertension: Secondary | ICD-10-CM | POA: Diagnosis not present

## 2017-02-24 DIAGNOSIS — Z7982 Long term (current) use of aspirin: Secondary | ICD-10-CM | POA: Diagnosis not present

## 2017-02-24 DIAGNOSIS — D473 Essential (hemorrhagic) thrombocythemia: Secondary | ICD-10-CM

## 2017-02-24 DIAGNOSIS — D649 Anemia, unspecified: Secondary | ICD-10-CM

## 2017-02-24 DIAGNOSIS — D471 Chronic myeloproliferative disease: Secondary | ICD-10-CM

## 2017-02-24 LAB — COMPREHENSIVE METABOLIC PANEL
ALT: 8 U/L (ref 0–55)
ANION GAP: 8 meq/L (ref 3–11)
AST: 20 U/L (ref 5–34)
Albumin: 3.9 g/dL (ref 3.5–5.0)
Alkaline Phosphatase: 64 U/L (ref 40–150)
BUN: 14.2 mg/dL (ref 7.0–26.0)
CALCIUM: 10.1 mg/dL (ref 8.4–10.4)
CHLORIDE: 99 meq/L (ref 98–109)
CO2: 27 mEq/L (ref 22–29)
CREATININE: 0.9 mg/dL (ref 0.6–1.1)
EGFR: 71 mL/min/{1.73_m2} — AB (ref >90)
Glucose: 107 mg/dl (ref 70–140)
Potassium: 4.1 mEq/L (ref 3.5–5.1)
Sodium: 134 mEq/L — ABNORMAL LOW (ref 136–145)
Total Bilirubin: 0.56 mg/dL (ref 0.20–1.20)
Total Protein: 7.4 g/dL (ref 6.4–8.3)

## 2017-02-24 LAB — CBC WITH DIFFERENTIAL/PLATELET
BASO%: 0.8 % (ref 0.0–2.0)
Basophils Absolute: 0 10*3/uL (ref 0.0–0.1)
EOS%: 0.7 % (ref 0.0–7.0)
Eosinophils Absolute: 0 10*3/uL (ref 0.0–0.5)
HEMATOCRIT: 33.2 % — AB (ref 34.8–46.6)
HGB: 11.1 g/dL — ABNORMAL LOW (ref 11.6–15.9)
LYMPH%: 23.6 % (ref 14.0–49.7)
MCH: 30.7 pg (ref 25.1–34.0)
MCHC: 33.5 g/dL (ref 31.5–36.0)
MCV: 91.7 fL (ref 79.5–101.0)
MONO#: 0.5 10*3/uL (ref 0.1–0.9)
MONO%: 8.8 % (ref 0.0–14.0)
NEUT#: 3.9 10*3/uL (ref 1.5–6.5)
NEUT%: 66.1 % (ref 38.4–76.8)
PLATELETS: 291 10*3/uL (ref 145–400)
RBC: 3.62 10*6/uL — AB (ref 3.70–5.45)
RDW: 11.8 % (ref 11.2–14.5)
WBC: 5.8 10*3/uL (ref 3.9–10.3)
lymph#: 1.4 10*3/uL (ref 0.9–3.3)

## 2017-02-24 NOTE — Progress Notes (Signed)
Paula Griffith OFFICE PROGRESS NOTE  Paula, [name], MD [address] [place] [address]  DIAGNOSIS: Essential thrombocythemia   MPN (myeloproliferative neoplasm)  PROBLEM LIST: 1. Essential thrombocythemia with thrombocytosis going back to 2000. Mutations for JAK2 V617F, Exon 12, and MPL515 were negative. The patient had a bone marrow on 11/14/2009 which showed slightly hypercellular marrow for the patient's age, along with abundant iron stores. There was minimal reticulin fibrosis. Trichrome stains failed to show any definite collagenous fibrosis. The patient is maintained on hydroxyurea and aspirin since mid June 2011.  2. Right retinal artery occlusion and subsequent retinal infarct on 05/22/2009. The patient's vision has returned to near normal.  3. Peripheral vascular disease involving the right carotid artery detected 08/05/2009.  4. Hypertension  5. Allergic rhinitis  6. History of asthma  7. Adenomatous colonic polyps.  8. Diverticulosis  Chief complaint: Follow-up  PREVIOUS AND CURRENT THERAPY:  1.Hydrea 500 mg  Every day except 1,000 mg on M and Thurs. Decreased to 500mg  daily on 06/10/2015, and further decreased to 500mg  daily except M andThursday due to anemia, stopped on 07/30/2015 2. Added anagrelide 0.5 mg twice daily on 06/10/2015, increased to 1mg  am 0.5mg  pm on 07/30/2015, and decreased to 0.5mg  daily on 09/03/2015, changed to anagrelide 0.5 mg twice daily on MWF, and 0.5mg  daily on the rest of week in 03/2016  3. Aspirin 325 mg daily.   INTERVAL HISTORY:  Paula Griffith 71 y.o. female with presumed essential thrombocythemia currently maintained on aspirin and agrelide is here for follow-up. She presents to the clinic today reporting she graduated top 10 of her class. She does want to go work again. She may not do it full time.  If she sits for a long time she will lose feeling in her legs L>R. She uses insoles to help and she walks much better.  She  reports to doing a EKG years ago and reports to it being normal, she had it because eye opthalmologist  found a blood clot behind her right eye.     MEDICAL HISTORY: Past Medical History:  Diagnosis Date  . Allergic rhinitis   . Asthma   . Colon polyps   . Diverticulosis of colon   . Glucose intolerance (impaired glucose tolerance)   . HTN (hypertension)   . LBP (low back pain)   . Osteoarthritis   . PVD (peripheral vascular disease) ([place]) 2011   Right Carotid Dr Scot Dock  . Retinal infarct 3244   embolic  . Thrombocytopathy [facility]) 2011   Dr Ralene Ok      ALLERGIES:  is allergic to benazepril; codeine; and tramadol.  MEDICATIONS: has a current medication list which includes the following prescription(s): albuterol, amlodipine, anagrelide, vitamin c, aspirin, biotin, budesonide-formoterol, cholecalciferol, gabapentin, ibuprofen, klor-con 10, losartan-hydrochlorothiazide, and multivitamin.  SURGICAL HISTORY:  Past Surgical History:  Procedure Laterality Date  . ABDOMINAL HYSTERECTOMY  1989  . BREAST EXCISIONAL BIOPSY Left     REVIEW OF SYSTEMS:   Constitutional: Denies fevers, chills or abnormal weight loss, (+) mildly decreased appetite Eyes: Denies blurriness of vision Ears, nose, mouth, throat, and face: Denies mucositis or sore throat Respiratory: Denies cough, dyspnea or wheezes Cardiovascular: Denies palpitation, chest discomfort or lower extremity swelling Gastrointestinal:  Denies nausea, heartburn or change in bowel habits Skin: Denies abnormal skin rashes Lymphatics: Denies new lymphadenopathy or easy bruising Neurological:Denies numbness, tingling or new weaknesses (+) occasional numbness in lower limbs  Behavioral/Psych: Mood is stable, no new changes  All other systems were reviewed with the patient and are negative.  PHYSICAL EXAMINATION: ECOG PERFORMANCE STATUS: 0 - Asymptomatic  Blood pressure (!) 143/62, pulse 67, temperature 99 F (37.2 C),  temperature source Oral, resp. rate 18, height 5\' 5"  (1.651 m), weight 147 lb 14.4 oz (67.1 kg), SpO2 100 %.  GENERAL:alert, no distress and comfortable; well developed and well nourished. SKIN: skin color, texture, turgor are normal, no rashes or significant lesions EYES: normal, Conjunctiva are pink and non-injected, sclera clear OROPHARYNX:no exudate, no erythema and lips, buccal mucosa, and tongue normal  NECK: supple, thyroid normal size, non-tender, without nodularity LYMPH:  no palpable lymphadenopathy in the cervical, axillary or supraclavicular LUNGS: clear to auscultation and percussion with normal breathing effort HEART: regular rate & rhythm and no murmurs and no lower extremity edema ABDOMEN:abdomen soft, non-tender and normal bowel sounds Musculoskeletal:no cyanosis of digits and no clubbing ; R ankle larger than Left with slight tenderness to palpation.  NEURO: alert & oriented x 3 with fluent speech, no focal motor/sensory deficits.    LABORATORY DATA: CBC Latest Ref Rng & Units 02/24/2017 12/24/2016 10/23/2016  WBC 3.9 - 10.3 10e3/uL 5.8 5.2 4.9  Hemoglobin 11.6 - 15.9 g/dL 11.1(L) 10.5(L) 10.9(L)  Hematocrit 34.8 - 46.6 % 33.2(L) 31.4(L) 31.6(L)  Platelets 145 - 400 10e3/uL 291 305 274    CMP Latest Ref Rng & Units 02/24/2017 08/26/2016 05/25/2016  Glucose 70 - 140 mg/dl 107 118 101  BUN 7.0 - 26.0 mg/dL 14.2 5.9(L) 12.1  Creatinine 0.6 - 1.1 mg/dL 0.9 0.9 0.8  Sodium 136 - 145 mEq/L 134(L) 133(L) 134(L)  Potassium 3.5 - 5.1 mEq/L 4.1 4.0 3.6  Chloride 96 - 112 mEq/L - - -  CO2 22 - 29 mEq/L 27 24 24   Calcium 8.4 - 10.4 mg/dL 10.1 9.7 9.5  Total Protein 6.4 - 8.3 g/dL 7.4 7.1 6.8  Total Bilirubin 0.20 - 1.20 mg/dL 0.56 0.40 0.33  Alkaline Phos 40 - 150 U/L 64 57 67  AST 5 - 34 U/L 20 17 16   ALT 0 - 55 U/L 8 8 <6      RADIOGRAPHIC STUDIES: No results found.  ASSESSMENT: Paula Griffith 71 y.o. female with a history of Essential thrombocythemia    1.  Myeloproliferative neoplasm-essential thrombocythemia, JAK2 mutation negative.  -We previously reviewed the natural history of ET, most patients do well well, major complication is thrombosis. -She was on Hydrea before, I stopped due to her anemia. -she Was switched to anagrelide for disease control. I discussed potential side effects, especially cardiac toxicities (palpitation, heart failure, prolonged QT) and slightly increased cardiovascular disease and mortality comparing to Hydrea. She voiced understanding and agrees to continue. We will monitor her EKG periodically. -Anemia has since improved. -Her anagrelide dose has been titrated, she is currently on anagrelide 0.5 mg twice daily on MWF, and 0.5mg  daily on the rest of week., platelet counts has been normal, 320K (08/26/16), will continue anagrelide to 0.5mg  twice daily  -Labs reviewed and Hb is 11.1 and levels overall within normal limits. She is tolerating anagrelide well. I discussed how anagrelide can effect her heart so she may need a EKG to monitor her heart function due to her history of heart issues.  -I'll monitor her CBC every 3 months and I'll see her back in 6 months with EKG same day.    2. Arthritis and back pain  -She will continue follow-up with her PCP and pain management clinic. -Her pain is mild,  and she has no limitation on her activities   3. Hypertension and other medical problems -She'll continue follow-up with her primary care physician  Plan -Reviewed the patient's labs. -continue anagrelide 0.5 mg twice daily on MWF, and 0.5mg  daily on the rest of week. -LAB  in 3 months  -Lab, EKG  and f/u in 6 months  -she will call me if she notices abnormal platelet count   All questions were answered. The patient knows to call the clinic with any problems, questions or concerns. We can certainly see the patient much sooner if necessary.  I spent 15 minutes counseling the patient face to face. The total time spent in the  appointment was 20 minutes.  This document serves as a record of services personally performed by Truitt Merle, MD. It was created on her behalf by Joslyn Devon, a trained medical scribe. The creation of this record is based on the scribe's personal observations and the provider's statements to them. This document has been checked and approved by the attending provider.    Truitt Merle, MD 02/24/2017

## 2017-02-24 NOTE — Telephone Encounter (Signed)
Gave patient avs and calendar for upcoming appts.  °

## 2017-02-25 ENCOUNTER — Encounter: Payer: Self-pay | Admitting: Hematology

## 2017-03-08 ENCOUNTER — Other Ambulatory Visit: Payer: Self-pay | Admitting: Internal Medicine

## 2017-03-12 ENCOUNTER — Other Ambulatory Visit: Payer: Self-pay | Admitting: Internal Medicine

## 2017-04-07 ENCOUNTER — Encounter: Payer: Self-pay | Admitting: Family

## 2017-04-07 ENCOUNTER — Ambulatory Visit (HOSPITAL_COMMUNITY)
Admission: RE | Admit: 2017-04-07 | Discharge: 2017-04-07 | Disposition: A | Discharge status: Home / Self Care | Payer: Medicare Other | Source: Ambulatory Visit | Attending: Family | Admitting: Family

## 2017-04-07 ENCOUNTER — Ambulatory Visit (INDEPENDENT_AMBULATORY_CARE_PROVIDER_SITE_OTHER): Payer: Medicare Other | Admitting: Family

## 2017-04-07 VITALS — BP 128/74 | HR 71 | Temp 97.6°F | Resp 18 | Ht 65.0 in | Wt 148.0 lb

## 2017-04-07 DIAGNOSIS — H349 Unspecified retinal vascular occlusion: Secondary | ICD-10-CM | POA: Insufficient documentation

## 2017-04-07 DIAGNOSIS — IMO0002 Reserved for concepts with insufficient information to code with codable children: Secondary | ICD-10-CM

## 2017-04-07 DIAGNOSIS — I6523 Occlusion and stenosis of bilateral carotid arteries: Secondary | ICD-10-CM | POA: Insufficient documentation

## 2017-04-07 LAB — VAS US CAROTID
LCCADSYS: -77 cm/s
LEFT ECA DIAS: -22 cm/s
LICADSYS: -103 cm/s
LICAPSYS: -80 cm/s
Left CCA dist dias: -23 cm/s
Left CCA prox dias: 26 cm/s
Left CCA prox sys: 101 cm/s
Left ICA dist dias: -39 cm/s
Left ICA prox dias: -26 cm/s
RCCADSYS: -82 cm/s
RIGHT CCA MID DIAS: 24 cm/s
RIGHT ECA DIAS: -12 cm/s
Right CCA prox dias: 26 cm/s
Right CCA prox sys: 104 cm/s
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
## 2017-04-07 NOTE — Patient Instructions (Signed)
Stroke Prevention Some medical conditions and behaviors are associated with an increased chance of having a stroke. You may prevent a stroke by making healthy choices and managing medical conditions. How can I reduce my risk of having a stroke?  Stay physically active. Get at least 30 minutes of activity on most or all days.  Do not smoke. It may also be helpful to avoid exposure to secondhand smoke.  Limit alcohol use. Moderate alcohol use is considered to be: ? No more than 2 drinks per day for men. ? No more than 1 drink per day for nonpregnant women.  Eat healthy foods. This involves: ? Eating 5 or more servings of fruits and vegetables a day. ? Making dietary changes that address high blood pressure (hypertension), high cholesterol, diabetes, or obesity.  Manage your cholesterol levels. ? Making food choices that are high in fiber and low in saturated fat, trans fat, and cholesterol may control cholesterol levels. ? Take any prescribed medicines to control cholesterol as directed by your health care provider.  Manage your diabetes. ? Controlling your carbohydrate and sugar intake is recommended to manage diabetes. ? Take any prescribed medicines to control diabetes as directed by your health care provider.  Control your hypertension. ? Making food choices that are low in salt (sodium), saturated fat, trans fat, and cholesterol is recommended to manage hypertension. ? Ask your health care provider if you need treatment to lower your blood pressure. Take any prescribed medicines to control hypertension as directed by your health care provider. ? If you are 18-39 years of age, have your blood pressure checked every 3-5 years. If you are 40 years of age or older, have your blood pressure checked every year.  Maintain a healthy weight. ? Reducing calorie intake and making food choices that are low in sodium, saturated fat, trans fat, and cholesterol are recommended to manage  weight.  Stop drug abuse.  Avoid taking birth control pills. ? Talk to your health care provider about the risks of taking birth control pills if you are over 35 years old, smoke, get migraines, or have ever had a blood clot.  Get evaluated for sleep disorders (sleep apnea). ? Talk to your health care provider about getting a sleep evaluation if you snore a lot or have excessive sleepiness.  Take medicines only as directed by your health care provider. ? For some people, aspirin or blood thinners (anticoagulants) are helpful in reducing the risk of forming abnormal blood clots that can lead to stroke. If you have the irregular heart rhythm of atrial fibrillation, you should be on a blood thinner unless there is a good reason you cannot take them. ? Understand all your medicine instructions.  Make sure that other conditions (such as anemia or atherosclerosis) are addressed. Get help right away if:  You have sudden weakness or numbness of the face, arm, or leg, especially on one side of the body.  Your face or eyelid droops to one side.  You have sudden confusion.  You have trouble speaking (aphasia) or understanding.  You have sudden trouble seeing in one or both eyes.  You have sudden trouble walking.  You have dizziness.  You have a loss of balance or coordination.  You have a sudden, severe headache with no known cause.  You have new chest pain or an irregular heartbeat. Any of these symptoms may represent a serious problem that is an emergency. Do not wait to see if the symptoms will go away.   Get medical help at once. Call your local emergency services (911 in [place] Do not drive yourself to the hospital. This information is not intended to replace advice given to you by your health care provider. Make sure you discuss any questions you have with your health care provider. Document Released: 07/23/2004 Document Revised: 11/21/2015 Document Reviewed: 12/16/2012 Elsevier  Interactive Patient Education  2017 Elsevier Inc.     Preventing Cerebrovascular Disease Arteries are blood vessels that carry blood that contains oxygen from the heart to all parts of the body. Cerebrovascular disease affects arteries that supply the brain. Any condition that blocks or disrupts blood flow to the brain can cause cerebrovascular disease. Brain cells that lose blood supply start to die within minutes (stroke). Stroke is the main danger of cerebrovascular disease. Atherosclerosis and high blood pressure are common causes of cerebrovascular disease. Atherosclerosis is narrowing and hardening of an artery that results when fat, cholesterol, calcium, or other substances (plaque) build up inside an artery. Plaque reduces blood flow through the artery. High blood pressure increases the risk of bleeding inside the brain. Making diet and lifestyle changes to prevent atherosclerosis and high blood pressure lowers your risk of cerebrovascular disease. What nutrition changes can be made?  Eat more fruits, vegetables, and whole grains.  Reduce how much saturated fat you eat. To do this, eat less red meat and fewer full-fat dairy products.  Eat healthy proteins instead of red meat. Healthy proteins include: ? Fish. Eat fish that contains heart-healthy omega-3 fatty acids, twice a week. Examples include salmon, albacore tuna, mackerel, and herring. ? Chicken. ? Nuts. ? Low-fat or nonfat yogurt.  Avoid processed meats, like bacon and lunchmeat.  Avoid foods that contain: ? A lot of sugar, such as sweets and drinks with added sugar. ? A lot of salt (sodium). Avoid adding extra salt to your food, as told by your health care provider. ? Trans fats, such as margarine and baked goods. Trans fats may be listed as "partially hydrogenated oils" on food labels.  Check food labels to see how much sodium, sugar, and trans fats are in foods.  Use vegetable oils that contain low amounts of  saturated fat, such as olive oil or canola oil. What lifestyle changes can be made?  Drink alcohol in moderation. This means no more than 1 drink a day for nonpregnant women and 2 drinks a day for men. One drink equals 12 oz of beer, 5 oz of wine, or 1 oz of hard liquor.  If you are overweight, ask your health care provider to recommend a weight-loss plan for you. Losing 5-10 lb (2.2-4.5 kg) can reduce your risk of diabetes, atherosclerosis, and high blood pressure.  Exercise for 30?60 minutes on most days, or as much as told by your health care provider. ? Do moderate-intensity exercise, such as brisk walking, bicycling, and water aerobics. Ask your health care provider which activities are safe for you.  Do not use any products that contain nicotine or tobacco, such as cigarettes and e-cigarettes. If you need help quitting, ask your health care provider. Why are these changes important? Making these changes lowers your risk of many diseases that can cause cerebrovascular disease and stroke. Stroke is a leading cause of death and disability. Making these changes also improves your overall health and quality of life. What can I do to lower my risk? The following factors make you more likely to develop cerebrovascular disease:  Being overweight.  Smoking.  Being physically inactive.    Eating a high-fat diet.  Having certain health conditions, such as: ? Diabetes. ? High blood pressure. ? Heart disease. ? Atherosclerosis. ? High cholesterol. ? Sickle cell disease.  Talk with your health care provider about your risk for cerebrovascular disease. Work with your health care provider to control diseases that you have that may contribute to cerebrovascular disease. Your health care provider may prescribe medicines to help prevent major causes of cerebrovascular disease. Where to find more information: Learn more about preventing cerebrovascular disease from:  National Heart, Lung, and  Blood Institute: www.nhlbi.nih.gov/health/health-topics/topics/stroke  Centers for Disease Control and Prevention: cdc.gov/stroke/about.htm  Summary  Cerebrovascular disease can lead to a stroke.  Atherosclerosis and high blood pressure are major causes of cerebrovascular disease.  Making diet and lifestyle changes can reduce your risk of cerebrovascular disease.  Work with your health care provider to get your risk factors under control to reduce your risk of cerebrovascular disease. This information is not intended to replace advice given to you by your health care provider. Make sure you discuss any questions you have with your health care provider. Document Released: 06/30/2015 Document Revised: 01/03/2016 Document Reviewed: 06/30/2015 Elsevier Interactive Patient Education  2018 Elsevier Inc.  

## 2017-04-23 ENCOUNTER — Other Ambulatory Visit: Payer: Self-pay | Admitting: Internal Medicine

## 2017-04-23 NOTE — Telephone Encounter (Signed)
Routing to dr plotnikov, please advise, thanks 

## 2017-05-24 ENCOUNTER — Other Ambulatory Visit: Payer: Self-pay | Admitting: Hematology

## 2017-05-24 DIAGNOSIS — D471 Chronic myeloproliferative disease: Secondary | ICD-10-CM

## 2017-05-27 ENCOUNTER — Telehealth: Payer: Self-pay | Admitting: Internal Medicine

## 2017-05-27 ENCOUNTER — Other Ambulatory Visit (HOSPITAL_BASED_OUTPATIENT_CLINIC_OR_DEPARTMENT_OTHER): Payer: Medicare Other

## 2017-05-27 DIAGNOSIS — D471 Chronic myeloproliferative disease: Secondary | ICD-10-CM | POA: Diagnosis not present

## 2017-05-27 DIAGNOSIS — G8929 Other chronic pain: Secondary | ICD-10-CM

## 2017-05-27 DIAGNOSIS — M5440 Lumbago with sciatica, unspecified side: Principal | ICD-10-CM

## 2017-05-27 LAB — COMPREHENSIVE METABOLIC PANEL
ALBUMIN: 4 g/dL (ref 3.5–5.0)
ALK PHOS: 64 U/L (ref 40–150)
ALT: <6 U/L (ref 0–55)
AST: 15 U/L (ref 5–34)
Anion Gap: 9 mEq/L (ref 3–11)
BILIRUBIN TOTAL: 0.38 mg/dL (ref 0.20–1.20)
BUN: 12.2 mg/dL (ref 7.0–26.0)
CALCIUM: 9.4 mg/dL (ref 8.4–10.4)
CO2: 25 mEq/L (ref 22–29)
CREATININE: 0.9 mg/dL (ref 0.6–1.1)
Chloride: 98 mEq/L (ref 98–109)
EGFR: >60 mL/min/{1.73_m2} (ref >60)
GLUCOSE: 105 mg/dL (ref 70–140)
Potassium: 3.5 mEq/L (ref 3.5–5.1)
SODIUM: 133 meq/L — AB (ref 136–145)
TOTAL PROTEIN: 7.2 g/dL (ref 6.4–8.3)

## 2017-05-27 LAB — CBC WITH DIFFERENTIAL/PLATELET
BASO%: 1 % (ref 0.0–2.0)
BASOS ABS: 0.1 10*3/uL (ref 0.0–0.1)
EOS ABS: 0.1 10*3/uL (ref 0.0–0.5)
EOS%: 1.2 % (ref 0.0–7.0)
HEMATOCRIT: 30.8 % — AB (ref 34.8–46.6)
HGB: 10.4 g/dL — ABNORMAL LOW (ref 11.6–15.9)
LYMPH#: 1.6 10*3/uL (ref 0.9–3.3)
LYMPH%: 28.8 % (ref 14.0–49.7)
MCH: 31.2 pg (ref 25.1–34.0)
MCHC: 33.9 g/dL (ref 31.5–36.0)
MCV: 92.1 fL (ref 79.5–101.0)
MONO#: 0.5 10*3/uL (ref 0.1–0.9)
MONO%: 9.6 % (ref 0.0–14.0)
NEUT%: 59.4 % (ref 38.4–76.8)
NEUTROS ABS: 3.3 10*3/uL (ref 1.5–6.5)
Platelets: 306 10*3/uL (ref 145–400)
RBC: 3.34 10*6/uL — ABNORMAL LOW (ref 3.70–5.45)
RDW: 12.1 % (ref 11.2–14.5)
WBC: 5.5 10*3/uL (ref 3.9–10.3)

## 2017-05-27 NOTE — Telephone Encounter (Signed)
Neurosurgery

## 2017-05-27 NOTE — Telephone Encounter (Signed)
Is it ortho or neurosurgery ref? Thx

## 2017-05-27 NOTE — Telephone Encounter (Signed)
Pt came by the office requesting a referral be sent in for her to [facility] (phone # [phone]). She said that she has been having back pain and numbness in her legs and arms through out the day. Can this referral be sent in for her?  She was last seen by Dr Alain Marion on 02/23/2017 and is scheduled to follow up on 08/26/2017 for a 6 mon fu.

## 2017-06-09 ENCOUNTER — Telehealth: Payer: Self-pay

## 2017-06-09 ENCOUNTER — Other Ambulatory Visit: Payer: Self-pay | Admitting: Hematology

## 2017-06-09 DIAGNOSIS — D471 Chronic myeloproliferative disease: Secondary | ICD-10-CM

## 2017-06-09 MED ORDER — GABAPENTIN 100 MG PO CAPS: 100.0000 mg | ORAL_CAPSULE | Freq: Three times a day (TID) | ORAL | 1 refills | Status: DC

## 2017-06-09 NOTE — Telephone Encounter (Signed)
[facility] for gabapentin 100 mg capsule. erx sent for 6 months.

## 2017-06-21 ENCOUNTER — Other Ambulatory Visit: Payer: Self-pay | Admitting: *Deleted

## 2017-06-21 DIAGNOSIS — D471 Chronic myeloproliferative disease: Secondary | ICD-10-CM

## 2017-06-21 MED ORDER — ANAGRELIDE HCL 0.5 MG PO CAPS: ORAL_CAPSULE | ORAL | 3 refills | Status: DC

## 2017-06-23 ENCOUNTER — Telehealth: Payer: Self-pay | Admitting: *Deleted

## 2017-06-23 NOTE — Telephone Encounter (Signed)
Message left for pt to return call to discuss lab results.

## 2017-06-30 NOTE — Telephone Encounter (Signed)
Reached pt to day & informed of lab results from Nov per Dr Burr Medico & reminded of next appts.

## 2017-07-04 ENCOUNTER — Other Ambulatory Visit: Payer: Self-pay | Admitting: Internal Medicine

## 2017-07-06 ENCOUNTER — Encounter: Payer: Self-pay | Admitting: Internal Medicine

## 2017-07-06 ENCOUNTER — Ambulatory Visit (INDEPENDENT_AMBULATORY_CARE_PROVIDER_SITE_OTHER): Payer: Medicare Other | Admitting: Internal Medicine

## 2017-07-06 ENCOUNTER — Other Ambulatory Visit (INDEPENDENT_AMBULATORY_CARE_PROVIDER_SITE_OTHER): Payer: Medicare Other

## 2017-07-06 VITALS — BP 122/58 | HR 89 | Temp 98.7°F | Ht 65.0 in | Wt 148.0 lb

## 2017-07-06 DIAGNOSIS — R202 Paresthesia of skin: Secondary | ICD-10-CM | POA: Diagnosis not present

## 2017-07-06 DIAGNOSIS — G8929 Other chronic pain: Secondary | ICD-10-CM | POA: Diagnosis not present

## 2017-07-06 DIAGNOSIS — M5432 Sciatica, left side: Secondary | ICD-10-CM | POA: Diagnosis not present

## 2017-07-06 DIAGNOSIS — M5442 Lumbago with sciatica, left side: Secondary | ICD-10-CM | POA: Diagnosis not present

## 2017-07-06 DIAGNOSIS — D471 Chronic myeloproliferative disease: Secondary | ICD-10-CM

## 2017-07-06 DIAGNOSIS — R269 Unspecified abnormalities of gait and mobility: Secondary | ICD-10-CM

## 2017-07-06 LAB — BASIC METABOLIC PANEL
BUN: 13 mg/dL (ref 6–23)
CHLORIDE: 96 meq/L (ref 96–112)
CO2: 28 mEq/L (ref 19–32)
CREATININE: 0.85 mg/dL (ref 0.40–1.20)
Calcium: 9.3 mg/dL (ref 8.4–10.5)
GFR: 84.61 mL/min (ref >60.00)
Glucose, Bld: 117 mg/dL — ABNORMAL HIGH (ref 70–99)
Potassium: 3.7 mEq/L (ref 3.5–5.1)
Sodium: 132 mEq/L — ABNORMAL LOW (ref 135–145)

## 2017-07-06 LAB — VITAMIN B12: Vitamin B-12: 544 pg/mL (ref 211–911)

## 2017-07-06 LAB — TSH: TSH: 1.06 u[IU]/mL (ref 0.35–4.50)

## 2017-07-06 MED ORDER — PREDNISONE 10 MG PO TABS: ORAL_TABLET | ORAL | 1 refills | Status: DC

## 2017-07-06 NOTE — Progress Notes (Signed)
Subjective:  Patient ID: Paula Griffith, female    DOB: May 29, 1946  Age: 72 y.o. MRN: [id number]  CC: No chief complaint on file.   HPI Paula Griffith presents for LBP, leg weakness B and numbness in the L leg and B fingertips. All is worse... C/o falling F/u HTN  Outpatient Medications Prior to Visit  Medication Sig Dispense Refill  . albuterol (PROAIR HFA) 108 (90 Base) MCG/ACT inhaler Inhale 2 puffs into the lungs every 4 (four) hours as needed. For shortness of breath 1 Inhaler 3  . amLODipine (NORVASC) 5 MG tablet TAKE 1 TABLET BY MOUTH EVERY DAY 90 tablet 2  . anagrelide (AGRYLIN) 0.5 MG capsule TAKE ONE CAPSULE TWICE A DAY MON/WEDS/FRI AND TAKE ONE CAPSULE EVERY DAY ALL OTHER DAYS 90 capsule 3  . Ascorbic Acid (VITAMIN C) 1000 MG tablet Take 1,000 mg by mouth daily.    Marland Kitchen aspirin 325 MG EC tablet Take 325 mg by mouth daily after breakfast.      . Biotin (BIOTIN MAXIMUM STRENGTH) 10 MG TABS Take by mouth daily.    . budesonide-formoterol (SYMBICORT) 80-4.5 MCG/ACT inhaler Inhale 2 puffs into the lungs 2 (two) times daily. 10.2 g 1  . Cholecalciferol 1000 UNITS tablet Take 1,000 Units by mouth daily.      Marland Kitchen gabapentin (NEURONTIN) 100 MG capsule Take 1 capsule (100 mg total) by mouth 3 (three) times daily. 270 capsule 1  . ibuprofen (ADVIL,MOTRIN) 600 MG tablet TAKE 1 TABLET (600 MG TOTAL) BY MOUTH 2 (TWO) TIMES DAILY AS NEEDED. 180 tablet 1  . KLOR-CON 10 10 MEQ tablet TAKE 1 TABLET BY MOUTH ONCE DAILY 90 tablet 3  . losartan-hydrochlorothiazide (HYZAAR) 100-25 MG tablet TAKE 1 TABLET BY MOUTH DAILY. 90 tablet 3  . Multiple Vitamin (MULTIVITAMIN) capsule Take 2 capsules by mouth daily.      No facility-administered medications prior to visit.     ROS Review of Systems  Constitutional: Positive for fatigue. Negative for activity change, appetite change, chills and unexpected weight change.  HENT: Negative for congestion, mouth sores and sinus pressure.   Eyes: Negative for  visual disturbance.  Respiratory: Negative for cough and chest tightness.   Gastrointestinal: Negative for abdominal pain and nausea.  Genitourinary: Negative for difficulty urinating, frequency and vaginal pain.  Musculoskeletal: Positive for back pain and gait problem.  Skin: Negative for pallor and rash.  Neurological: Positive for weakness and numbness. Negative for dizziness, tremors and headaches.  Psychiatric/Behavioral: Negative for confusion and sleep disturbance.    Objective:  BP (!) 122/58 (BP Location: Left Arm, Patient Position: Sitting, Cuff Size: Large)   Pulse 89   Temp 98.7 F (37.1 C) (Oral)   Ht 5\' 5"  (1.651 m)   Wt 148 lb (67.1 kg)   SpO2 100%   BMI 24.63 kg/m   BP Readings from Last 3 Encounters:  07/06/17 (!) 122/58  04/07/17 128/74  02/24/17 (!) 143/62    Wt Readings from Last 3 Encounters:  07/06/17 148 lb (67.1 kg)  04/07/17 148 lb (67.1 kg)  02/24/17 147 lb 14.4 oz (67.1 kg)    Physical Exam  Constitutional: She appears well-developed. No distress.  HENT:  Head: Normocephalic.  Right Ear: External ear normal.  Left Ear: External ear normal.  Nose: Nose normal.  Mouth/Throat: Oropharynx is clear and moist.  Eyes: Conjunctivae are normal. Pupils are equal, round, and reactive to light. Right eye exhibits no discharge. Left eye exhibits no discharge.  Neck: Normal  range of motion. Neck supple. No JVD present. No tracheal deviation present. No thyromegaly present.  Cardiovascular: Normal rate, regular rhythm and normal heart sounds.  Pulmonary/Chest: No stridor. No respiratory distress. She has no wheezes.  Abdominal: Soft. Bowel sounds are normal. She exhibits no distension and no mass. There is no tenderness. There is no rebound and no guarding.  Musculoskeletal: She exhibits tenderness. She exhibits no edema.  Lymphadenopathy:    She has no cervical adenopathy.  Neurological: She displays normal reflexes. No cranial nerve deficit. She  exhibits normal muscle tone. Coordination abnormal.  Skin: No rash noted. No erythema.  Psychiatric: She has a normal mood and affect. Her behavior is normal. Judgment and thought content normal.  LS tender Cane LS tender MS hard to assess Str leg elev (+) on the L  Lab Results  Component Value Date   WBC 5.5 05/27/2017   HGB 10.4 (L) 05/27/2017   HCT 30.8 (L) 05/27/2017   PLT 306 05/27/2017   GLUCOSE 105 05/27/2017   CHOL 133 10/25/2015   TRIG 103.0 10/25/2015   HDL 35.70 (L) 10/25/2015   LDLCALC 77 10/25/2015   ALT <6 05/27/2017   AST 15 05/27/2017   NA 133 (L) 05/27/2017   K 3.5 05/27/2017   CL 98 10/25/2015   CREATININE 0.9 05/27/2017   BUN 12.2 05/27/2017   CO2 25 05/27/2017   TSH 1.24 10/25/2015   HGBA1C 6.1 10/25/2015    No results found.  Assessment & Plan:   There are no diagnoses linked to this encounter. I am having Paula Griffith maintain her aspirin, Cholecalciferol, multivitamin, Biotin, albuterol, budesonide-formoterol, vitamin C, losartan-hydrochlorothiazide, KLOR-CON 10, ibuprofen, gabapentin, anagrelide, and amLODipine.  No orders of the defined types were placed in this encounter.    Follow-up: No Follow-up on file.  [name], MD

## 2017-07-06 NOTE — Assessment & Plan Note (Signed)
F/u w/Dr Feng 

## 2017-07-06 NOTE — Assessment & Plan Note (Signed)
Will ref to Dr Sherley Bounds Prednisone 10 mg: take 4 tabs a day x 3 days; then 3 tabs a day x 4 days; then 2 tabs a day x 4 days, then 1 tab a day x 6 days, then stop. Take pc.

## 2017-07-06 NOTE — Assessment & Plan Note (Signed)
Due to spinal stenosis - worse  Prednisone 10 mg: take 4 tabs a day x 3 days; then 3 tabs a day x 4 days; then 2 tabs a day x 4 days, then 1 tab a day x 6 days, then stop. Take pc.

## 2017-07-06 NOTE — Assessment & Plan Note (Signed)
Worse Prednisone 10 mg: take 4 tabs a day x 3 days; then 3 tabs a day x 4 days; then 2 tabs a day x 4 days, then 1 tab a day x 6 days, then stop. Take pc.

## 2017-07-27 DIAGNOSIS — M48062 Spinal stenosis, lumbar region with neurogenic claudication: Secondary | ICD-10-CM | POA: Diagnosis not present

## 2017-07-27 DIAGNOSIS — M4316 Spondylolisthesis, lumbar region: Secondary | ICD-10-CM | POA: Diagnosis not present

## 2017-08-23 NOTE — Progress Notes (Signed)
[facility] OFFICE PROGRESS NOTE  [name], [name], MD [address] [place] [address]  DIAGNOSIS: Essential thrombocythemia   MPN (myeloproliferative neoplasm)  PROBLEM LIST: 1. Essential thrombocythemia with thrombocytosis going back to 2000. Mutations for JAK2 V617F, Exon 12, and MPL515 were negative. The patient had a bone marrow on 11/14/2009 which showed slightly hypercellular marrow for the patient's age, along with abundant iron stores. There was minimal reticulin fibrosis. Trichrome stains failed to show any definite collagenous fibrosis. The patient is maintained on hydroxyurea and aspirin since mid June 2011.  2. Right retinal artery occlusion and subsequent retinal infarct on 05/22/2009. The patient's vision has returned to near normal.  3. Peripheral vascular disease involving the right carotid artery detected 08/05/2009.  4. Hypertension  5. Allergic rhinitis  6. History of asthma  7. Adenomatous colonic polyps.  8. Diverticulosis  Chief complaint: Follow-up  PREVIOUS AND CURRENT THERAPY:  1.Hydrea 500 mg  Every day except 1,000 mg on M and Thurs. Decreased to 500mg  daily on 06/10/2015, and further decreased to 500mg  daily except M andThursday due to anemia, stopped on 07/30/2015 2. Added anagrelide 0.5 mg twice daily on 06/10/2015, increased to 1mg  am 0.5mg  pm on 07/30/2015, and decreased to 0.5mg  daily on 09/03/2015, changed to anagrelide 0.5 mg twice daily on MWF, and 0.5mg  daily on the rest of week in 03/2016  3. Aspirin 325 mg daily.   INTERVAL HISTORY:  Paula Griffith 72 y.o. female with presumed essential thrombocythemia currently maintained on aspirin and agrelide is here for follow-up. She presents to the clinic today by herself. She reports she has back pain and is being treated with prednisone an dit is helping. She has an MRI scheduled. She takes 0.5 mg anagrelide BID 3 days a week and 0.5 mg QD 4 days a week.   On review of systems, pt denies  chest discomfort, or any other complaints at this time. Pertinent positives are listed and detailed within the above HPI.   MEDICAL HISTORY: Past Medical History:  Diagnosis Date  . Allergic rhinitis   . Asthma   . Colon polyps   . Diverticulosis of colon   . Glucose intolerance (impaired glucose tolerance)   . HTN (hypertension)   . LBP (low back pain)   . Osteoarthritis   . PVD (peripheral vascular disease) ([place]) 2011   Right Carotid Dr Scot Dock  . Retinal infarct 2355   embolic  . Thrombocytopathy [facility]) 2011   Dr Ralene Ok      ALLERGIES:  is allergic to benazepril; codeine; and tramadol.  MEDICATIONS: has a current medication list which includes the following prescription(s): albuterol, amlodipine, anagrelide, vitamin c, aspirin, biotin, budesonide-formoterol, cholecalciferol, gabapentin, ibuprofen, klor-con 10, losartan-hydrochlorothiazide, and multivitamin.  SURGICAL HISTORY:  Past Surgical History:  Procedure Laterality Date  . ABDOMINAL HYSTERECTOMY  1989  . BREAST EXCISIONAL BIOPSY Left     REVIEW OF SYSTEMS:   Constitutional: Denies fevers, chills or abnormal weight loss, (+) mildly decreased appetite Eyes: Denies blurriness of vision Ears, nose, mouth, throat, and face: Denies mucositis or sore throat Respiratory: Denies cough, dyspnea or wheezes Cardiovascular: Denies palpitation, chest discomfort or lower extremity swelling Gastrointestinal:  Denies nausea, heartburn or change in bowel habits Skin: Denies abnormal skin rashes Lymphatics: Denies new lymphadenopathy or easy bruising Neurological:Denies numbness, tingling or new weaknesses (+) occasional numbness in lower limbs  Behavioral/Psych: Mood is stable, no new changes  MSK: (+) back pain  All other systems were reviewed with the  patient and are negative.  PHYSICAL EXAMINATION: ECOG PERFORMANCE STATUS: 0 - Asymptomatic  Blood pressure (!) 150/75, pulse 79, temperature 98.8 F (37.1 C), temperature  source Oral, resp. rate 17, height 5\' 5"  (1.651 m), weight 146 lb 14.4 oz (66.6 kg), SpO2 100 %.  GENERAL:alert, no distress and comfortable; well developed and well nourished. SKIN: skin color, texture, turgor are normal, no rashes or significant lesions EYES: normal, Conjunctiva are pink and non-injected, sclera clear OROPHARYNX:no exudate, no erythema and lips, buccal mucosa, and tongue normal  NECK: supple, thyroid normal size, non-tender, without nodularity LYMPH:  no palpable lymphadenopathy in the cervical, axillary or supraclavicular LUNGS: clear to auscultation and percussion with normal breathing effort HEART: regular rate & rhythm and no murmurs and no lower extremity edema ABDOMEN:abdomen soft, non-tender and normal bowel sounds Musculoskeletal:no cyanosis of digits and no clubbing ; R ankle larger than Left with slight tenderness to palpation.  NEURO: alert & oriented x 3 with fluent speech, no focal motor/sensory deficits.    LABORATORY DATA: CBC Latest Ref Rng & Units 08/25/2017 05/27/2017 02/24/2017  WBC 3.9 - 10.3 K/uL 12.9(H) 5.5 5.8  Hemoglobin 11.6 - 15.9 g/dL 11.0(L) 10.4(L) 11.1(L)  Hematocrit 34.8 - 46.6 % 32.6(L) 30.8(L) 33.2(L)  Platelets 145 - 400 K/uL 297 306 291    CMP Latest Ref Rng & Units 07/06/2017 05/27/2017 02/24/2017  Glucose 70 - 99 mg/dL 117(H) 105 107  BUN 6 - 23 mg/dL 13 12.2 14.2  Creatinine 0.40 - 1.20 mg/dL 0.85 0.9 0.9  Sodium 135 - 145 mEq/L 132(L) 133(L) 134(L)  Potassium 3.5 - 5.1 mEq/L 3.7 3.5 4.1  Chloride 96 - 112 mEq/L 96 - -  CO2 19 - 32 mEq/L 28 25 27   Calcium 8.4 - 10.5 mg/dL 9.3 9.4 10.1  Total Protein 6.4 - 8.3 g/dL - 7.2 7.4  Total Bilirubin 0.20 - 1.20 mg/dL - 0.38 0.56  Alkaline Phos 40 - 150 U/L - 64 64  AST 5 - 34 U/L - 15 20  ALT 0-55 U/L U/L - <6 8      RADIOGRAPHIC STUDIES: No results found.  ASSESSMENT: Paula Griffith 72 y.o. female with a history of Essential thrombocythemia    1. Myeloproliferative  neoplasm-essential thrombocythemia, JAK2 mutation negative.  -We previously reviewed the natural history of ET, most patients do well well, major complication is thrombosis. -She was on Hydrea before, I stopped due to her anemia. -she was switched to anagrelide for disease control. I discussed potential side effects, especially cardiac toxicities (palpitation, heart failure, prolonged QT) and slightly increased cardiovascular disease and mortality comparing to Hydrea. She voiced understanding and agrees to continue. We will monitor her EKG periodically. -Anemia has since improved. -Her anagrelide dose has been titrated, she is currently on anagrelide 0.5 mg twice daily on MWF, and 0.5mg  daily on the rest of week., platelet counts has been normal, 297K (08/25/17), will continue anagrelide to 0.5mg  twice daily  -Labs reviewed and Hgb is 11.0 and levels overall within normal limits. Her WBC is 12.9. She is taking prednisone and I suspect this is the cause. She is almost finished and I expect her WBC level to return to normal.  -She is tolerating anagrelide well. I again discussed how anagrelide can effect increase her cardiovascular disease.  Her EKG is normal today.  -I'll monitor her CBC every 3 months and I'll see her back in 6 months -F/u in 6 months    2. Arthritis and back pain  -She will  continue follow-up with her PCP and pain management clinic. -Her pain is mild, and she has no limitation on her activities   3. Hypertension and other medical problems -She'll continue follow-up with her primary care physician  Plan -continue anagrelide 0.5 mg twice daily on MWF, and 0.5mg  daily on the rest of week. Refilled today -Lab in 3 months  -Lab, and f/u in 6 months    All questions were answered. The patient knows to call the clinic with any problems, questions or concerns. We can certainly see the patient much sooner if necessary.  I spent 15 minutes counseling the patient face to face. The  total time spent in the appointment was 20 minutes.  This document serves as a record of services personally performed by Truitt Merle, MD. It was created on her behalf by Theresia Bough, a trained medical scribe. The creation of this record is based on the scribe's personal observations and the provider's statements to them.   I have reviewed the above documentation for accuracy and completeness, and I agree with the above.     Truitt Merle, MD 08/25/2017 1:41 PM

## 2017-08-25 ENCOUNTER — Telehealth: Payer: Self-pay

## 2017-08-25 ENCOUNTER — Inpatient Hospital Stay (HOSPITAL_BASED_OUTPATIENT_CLINIC_OR_DEPARTMENT_OTHER): Payer: Medicare Other | Admitting: Hematology

## 2017-08-25 ENCOUNTER — Inpatient Hospital Stay: Payer: Medicare Other

## 2017-08-25 ENCOUNTER — Inpatient Hospital Stay: Payer: Medicare Other | Attending: Hematology

## 2017-08-25 ENCOUNTER — Encounter: Payer: Self-pay | Admitting: Hematology

## 2017-08-25 VITALS — BP 150/75 | HR 79 | Temp 98.8°F | Resp 17 | Ht 65.0 in | Wt 146.9 lb

## 2017-08-25 DIAGNOSIS — M1990 Unspecified osteoarthritis, unspecified site: Secondary | ICD-10-CM | POA: Insufficient documentation

## 2017-08-25 DIAGNOSIS — D649 Anemia, unspecified: Secondary | ICD-10-CM | POA: Insufficient documentation

## 2017-08-25 DIAGNOSIS — I10 Essential (primary) hypertension: Secondary | ICD-10-CM

## 2017-08-25 DIAGNOSIS — M549 Dorsalgia, unspecified: Secondary | ICD-10-CM | POA: Insufficient documentation

## 2017-08-25 DIAGNOSIS — D471 Chronic myeloproliferative disease: Secondary | ICD-10-CM

## 2017-08-25 LAB — CBC WITH DIFFERENTIAL/PLATELET
Basophils Absolute: 0 10*3/uL (ref 0.0–0.1)
Basophils Relative: 0 %
EOS ABS: 0 10*3/uL (ref 0.0–0.5)
Eosinophils Relative: 0 %
HEMATOCRIT: 32.6 % — AB (ref 34.8–46.6)
HEMOGLOBIN: 11 g/dL — AB (ref 11.6–15.9)
LYMPHS ABS: 2.7 10*3/uL (ref 0.9–3.3)
LYMPHS PCT: 21 %
MCH: 31 pg (ref 25.1–34.0)
MCHC: 33.7 g/dL (ref 31.5–36.0)
MCV: 92.1 fL (ref 79.5–101.0)
Monocytes Absolute: 1.2 10*3/uL — ABNORMAL HIGH (ref 0.1–0.9)
Monocytes Relative: 9 %
NEUTROS PCT: 70 %
Neutro Abs: 9 10*3/uL — ABNORMAL HIGH (ref 1.5–6.5)
Platelets: 297 10*3/uL (ref 145–400)
RBC: 3.54 MIL/uL — AB (ref 3.70–5.45)
RDW: 12.3 % (ref 11.2–14.5)
WBC: 12.9 10*3/uL — AB (ref 3.9–10.3)

## 2017-08-25 MED ORDER — ANAGRELIDE HCL 0.5 MG PO CAPS: ORAL_CAPSULE | ORAL | 3 refills | Status: DC

## 2017-08-25 NOTE — Telephone Encounter (Signed)
Printed avs and calender of upcoming appointment.per 2/27 los 

## 2017-08-26 ENCOUNTER — Ambulatory Visit (INDEPENDENT_AMBULATORY_CARE_PROVIDER_SITE_OTHER): Payer: Medicare Other | Admitting: Internal Medicine

## 2017-08-26 ENCOUNTER — Other Ambulatory Visit: Payer: Self-pay | Admitting: Internal Medicine

## 2017-08-26 ENCOUNTER — Encounter: Payer: Self-pay | Admitting: Internal Medicine

## 2017-08-26 ENCOUNTER — Other Ambulatory Visit (INDEPENDENT_AMBULATORY_CARE_PROVIDER_SITE_OTHER): Payer: Medicare Other

## 2017-08-26 VITALS — BP 128/70 | HR 73 | Temp 98.7°F | Ht 65.0 in | Wt 148.0 lb

## 2017-08-26 DIAGNOSIS — J4531 Mild persistent asthma with (acute) exacerbation: Secondary | ICD-10-CM

## 2017-08-26 DIAGNOSIS — R739 Hyperglycemia, unspecified: Secondary | ICD-10-CM

## 2017-08-26 DIAGNOSIS — M4316 Spondylolisthesis, lumbar region: Secondary | ICD-10-CM | POA: Diagnosis not present

## 2017-08-26 DIAGNOSIS — R609 Edema, unspecified: Secondary | ICD-10-CM

## 2017-08-26 DIAGNOSIS — Z0000 Encounter for general adult medical examination without abnormal findings: Secondary | ICD-10-CM

## 2017-08-26 DIAGNOSIS — M5432 Sciatica, left side: Secondary | ICD-10-CM

## 2017-08-26 DIAGNOSIS — I10 Essential (primary) hypertension: Secondary | ICD-10-CM

## 2017-08-26 DIAGNOSIS — M545 Low back pain: Secondary | ICD-10-CM | POA: Diagnosis not present

## 2017-08-26 DIAGNOSIS — R269 Unspecified abnormalities of gait and mobility: Secondary | ICD-10-CM

## 2017-08-26 DIAGNOSIS — M542 Cervicalgia: Secondary | ICD-10-CM | POA: Diagnosis not present

## 2017-08-26 LAB — CBC WITH DIFFERENTIAL/PLATELET
BASOS ABS: 0 10*3/uL (ref 0.0–0.1)
Basophils Relative: 0.2 % (ref 0.0–3.0)
Eosinophils Absolute: 0 10*3/uL (ref 0.0–0.7)
Eosinophils Relative: 0 % (ref 0.0–5.0)
HEMATOCRIT: 32.7 % — AB (ref 36.0–46.0)
Hemoglobin: 11.1 g/dL — ABNORMAL LOW (ref 12.0–15.0)
LYMPHS ABS: 2.5 10*3/uL (ref 0.7–4.0)
LYMPHS PCT: 19.6 % (ref 12.0–46.0)
MCHC: 34 g/dL (ref 30.0–36.0)
MCV: 91.5 fl (ref 78.0–100.0)
MONOS PCT: 7.4 % (ref 3.0–12.0)
Monocytes Absolute: 0.9 10*3/uL (ref 0.1–1.0)
NEUTROS PCT: 72.8 % (ref 43.0–77.0)
Neutro Abs: 9.1 10*3/uL — ABNORMAL HIGH (ref 1.4–7.7)
Platelets: 334 10*3/uL (ref 150.0–400.0)
RBC: 3.58 Mil/uL — AB (ref 3.87–5.11)
RDW: 12.7 % (ref 11.5–15.5)
WBC: 12.6 10*3/uL — ABNORMAL HIGH (ref 4.0–10.5)

## 2017-08-26 LAB — URINALYSIS, ROUTINE W REFLEX MICROSCOPIC
BILIRUBIN URINE: NEGATIVE
Hgb urine dipstick: NEGATIVE
KETONES UR: NEGATIVE
NITRITE: POSITIVE — AB
PH: 6 (ref 5.0–8.0)
RBC / HPF: NONE SEEN (ref 0–?)
Specific Gravity, Urine: 1.01 (ref 1.000–1.030)
Total Protein, Urine: NEGATIVE
URINE GLUCOSE: 100 — AB
Urobilinogen, UA: 0.2 (ref 0.0–1.0)

## 2017-08-26 LAB — BASIC METABOLIC PANEL
BUN: 16 mg/dL (ref 6–23)
CALCIUM: 9.9 mg/dL (ref 8.4–10.5)
CO2: 28 mEq/L (ref 19–32)
CREATININE: 0.86 mg/dL (ref 0.40–1.20)
Chloride: 95 mEq/L — ABNORMAL LOW (ref 96–112)
GFR: 83.45 mL/min (ref >60.00)
Glucose, Bld: 140 mg/dL — ABNORMAL HIGH (ref 70–99)
Potassium: 3.8 mEq/L (ref 3.5–5.1)
Sodium: 133 mEq/L — ABNORMAL LOW (ref 135–145)

## 2017-08-26 LAB — HEMOGLOBIN A1C: Hgb A1c MFr Bld: 6.8 % — ABNORMAL HIGH (ref 4.6–6.5)

## 2017-08-26 LAB — LIPID PANEL
CHOL/HDL RATIO: 3
Cholesterol: 142 mg/dL (ref 0–200)
HDL: 48.3 mg/dL (ref >39.00)
LDL Cholesterol: 73 mg/dL (ref 0–99)
NONHDL: 93.23
Triglycerides: 100 mg/dL (ref 0.0–149.0)
VLDL: 20 mg/dL (ref 0.0–40.0)

## 2017-08-26 LAB — TSH: TSH: 1.07 u[IU]/mL (ref 0.35–4.50)

## 2017-08-26 MED ORDER — CIPROFLOXACIN HCL 250 MG PO TABS: 250.0000 mg | ORAL_TABLET | Freq: Two times a day (BID) | ORAL | 0 refills | Status: DC

## 2017-08-26 NOTE — Assessment & Plan Note (Signed)
F/u LBP - seeing Dr Ronnald Ramp (NS); MRI is pennding today

## 2017-08-26 NOTE — Assessment & Plan Note (Signed)
On [place]

## 2017-08-26 NOTE — Progress Notes (Signed)
Subjective:  Patient ID: Paula Griffith, female    DOB: 1946-02-28  Age: 72 y.o. MRN: [id number]  CC: No chief complaint on file.   HPI BRUCHY MIKEL presents for HTN, LBP, asthmatic bronchitis Volunteering a lot  F/u LBP - seeing Dr Ronnald Ramp (NS); MRI is pennding today  Outpatient Medications Prior to Visit  Medication Sig Dispense Refill  . albuterol (PROAIR HFA) 108 (90 Base) MCG/ACT inhaler Inhale 2 puffs into the lungs every 4 (four) hours as needed. For shortness of breath 1 Inhaler 3  . amLODipine (NORVASC) 5 MG tablet TAKE 1 TABLET BY MOUTH EVERY DAY 90 tablet 2  . anagrelide (AGRYLIN) 0.5 MG capsule TAKE ONE CAPSULE TWICE A DAY MON/WEDS/FRI AND TAKE ONE CAPSULE EVERY DAY ALL OTHER DAYS 90 capsule 3  . Ascorbic Acid (VITAMIN C) 1000 MG tablet Take 1,000 mg by mouth daily.    Marland Kitchen aspirin 325 MG EC tablet Take 325 mg by mouth daily after breakfast.      . Biotin (BIOTIN MAXIMUM STRENGTH) 10 MG TABS Take by mouth daily.    . budesonide-formoterol (SYMBICORT) 80-4.5 MCG/ACT inhaler Inhale 2 puffs into the lungs 2 (two) times daily. 10.2 g 1  . Cholecalciferol 1000 UNITS tablet Take 1,000 Units by mouth daily.      Marland Kitchen gabapentin (NEURONTIN) 100 MG capsule Take 1 capsule (100 mg total) by mouth 3 (three) times daily. 270 capsule 1  . ibuprofen (ADVIL,MOTRIN) 600 MG tablet TAKE 1 TABLET (600 MG TOTAL) BY MOUTH 2 (TWO) TIMES DAILY AS NEEDED. 180 tablet 1  . KLOR-CON 10 10 MEQ tablet TAKE 1 TABLET BY MOUTH ONCE DAILY 90 tablet 3  . losartan-hydrochlorothiazide (HYZAAR) 100-25 MG tablet TAKE 1 TABLET BY MOUTH DAILY. 90 tablet 3  . Multiple Vitamin (MULTIVITAMIN) capsule Take 2 capsules by mouth daily.     . predniSONE (DELTASONE) 10 MG tablet Prednisone 10 mg: take 4 tabs a day x 3 days; then 3 tabs a day x 4 days; then 2 tabs a day x 4 days, then 1 tab a day x 6 days, then stop. Take pc. 38 tablet 1   No facility-administered medications prior to visit.     ROS Review of Systems    Constitutional: Negative for activity change, appetite change, chills, fatigue and unexpected weight change.  HENT: Negative for congestion, mouth sores and sinus pressure.   Eyes: Negative for visual disturbance.  Respiratory: Negative for cough and chest tightness.   Gastrointestinal: Negative for abdominal pain and nausea.  Genitourinary: Negative for difficulty urinating, frequency and vaginal pain.  Musculoskeletal: Negative for back pain and gait problem.  Skin: Negative for pallor and rash.  Neurological: Negative for dizziness, tremors, weakness, numbness and headaches.  Psychiatric/Behavioral: Negative for confusion, sleep disturbance and suicidal ideas.    Objective:  BP 128/70 (BP Location: Left Arm, Patient Position: Sitting, Cuff Size: Large)   Pulse 73   Temp 98.7 F (37.1 C) (Oral)   Ht 5\' 5"  (1.651 m)   Wt 148 lb (67.1 kg)   SpO2 99%   BMI 24.63 kg/m   BP Readings from Last 3 Encounters:  08/26/17 128/70  08/25/17 (!) 150/75  07/06/17 (!) 122/58    Wt Readings from Last 3 Encounters:  08/26/17 148 lb (67.1 kg)  08/25/17 146 lb 14.4 oz (66.6 kg)  07/06/17 148 lb (67.1 kg)    Physical Exam  Constitutional: She appears well-developed. No distress.  HENT:  Head: Normocephalic.  Right Ear: External  ear normal.  Left Ear: External ear normal.  Nose: Nose normal.  Mouth/Throat: Oropharynx is clear and moist.  Eyes: Conjunctivae are normal. Pupils are equal, round, and reactive to light. Right eye exhibits no discharge. Left eye exhibits no discharge.  Neck: Normal range of motion. Neck supple. No JVD present. No tracheal deviation present. No thyromegaly present.  Cardiovascular: Normal rate, regular rhythm and normal heart sounds.  Pulmonary/Chest: No stridor. No respiratory distress. She has no wheezes.  Abdominal: Soft. Bowel sounds are normal. She exhibits no distension and no mass. There is no tenderness. There is no rebound and no guarding.   Musculoskeletal: She exhibits no edema or tenderness.  Lymphadenopathy:    She has no cervical adenopathy.  Neurological: She displays normal reflexes. No cranial nerve deficit. She exhibits normal muscle tone. Coordination normal.  Skin: No rash noted. No erythema.  Psychiatric: She has a normal mood and affect. Her behavior is normal. Judgment and thought content normal.  LS tender  Lab Results  Component Value Date   WBC 12.9 (H) 08/25/2017   HGB 11.0 (L) 08/25/2017   HCT 32.6 (L) 08/25/2017   PLT 297 08/25/2017   GLUCOSE 117 (H) 07/06/2017   CHOL 133 10/25/2015   TRIG 103.0 10/25/2015   HDL 35.70 (L) 10/25/2015   LDLCALC 77 10/25/2015   ALT <6 05/27/2017   AST 15 05/27/2017   NA 132 (L) 07/06/2017   K 3.7 07/06/2017   CL 96 07/06/2017   CREATININE 0.85 07/06/2017   BUN 13 07/06/2017   CO2 28 07/06/2017   TSH 1.06 07/06/2017   HGBA1C 6.1 10/25/2015    No results found.  Assessment & Plan:   There are no diagnoses linked to this encounter. I am having Paula Griffith maintain her aspirin, Cholecalciferol, multivitamin, Biotin, albuterol, budesonide-formoterol, vitamin C, losartan-hydrochlorothiazide, KLOR-CON 10, ibuprofen, gabapentin, amLODipine, predniSONE, and anagrelide.  No orders of the defined types were placed in this encounter.    Follow-up: No Follow-up on file.  [name], MD

## 2017-08-26 NOTE — Assessment & Plan Note (Signed)
Labs

## 2017-08-26 NOTE — Assessment & Plan Note (Signed)
Resolved

## 2017-08-26 NOTE — Assessment & Plan Note (Signed)
Losartan HCT, Amlodipine 

## 2017-08-27 NOTE — Telephone Encounter (Signed)
Routing to dr plotnikov, please advise, thanks 

## 2017-09-06 ENCOUNTER — Other Ambulatory Visit: Payer: Self-pay | Admitting: Neurological Surgery

## 2017-09-06 DIAGNOSIS — I10 Essential (primary) hypertension: Secondary | ICD-10-CM | POA: Diagnosis not present

## 2017-09-06 DIAGNOSIS — M542 Cervicalgia: Secondary | ICD-10-CM | POA: Diagnosis not present

## 2017-09-06 DIAGNOSIS — M4316 Spondylolisthesis, lumbar region: Secondary | ICD-10-CM | POA: Diagnosis not present

## 2017-09-20 ENCOUNTER — Telehealth: Payer: Self-pay | Admitting: *Deleted

## 2017-09-20 NOTE — Pre-Procedure Instructions (Signed)
Paula Griffith  09/20/2017      [id number] [name], [place] [address] Phone: [phone] Fax: [phone]    Your procedure is scheduled on Wed., Mar. 27, 2019  Report to [facility] Admitting Entrance "A" at 11:30AM   Call this number if you have problems the morning of surgery:  [phone]   Remember:  Do not eat food or drink liquids after midnight.  Take these medicines the morning of surgery with A SIP OF WATER: AmLODipine (NORVASC), Gabapentin (NEURONTIN), and  Budesonide-formoterol (SYMBICORT). If needed Albuterol Inhaler for cough or wheezing (Bring with you the day of surgery).  Follow your doctor's instruction regarding Aspirin and Anagrelide (AGRYLIN).  As of today, stop taking all Aspirins, Vitamins, Fish oils, and Herbal medications. Also stop all NSAIDS i.e. Advil, Ibuprofen, Motrin, Aleve, Anaprox, Naproxen, BC and Goody Powders.   Do not wear jewelry, make-up or nail polish.  Do not wear lotions, powders,  perfumes, or deodorant.  Do not shave 48 hours prior to surgery.    Do not bring valuables to the hospital.  [facility] is not responsible for any belongings or valuables.  Contacts, dentures or bridgework may not be worn into surgery.  Leave your suitcase in the car.  After surgery it may be brought to your room.  For patients admitted to the hospital, discharge time will be determined by your treatment team.  Patients discharged the day of surgery will not be allowed to drive home.   Special instructions:  [name]  Before surgery, you can play an important role. Because skin is not sterile, your skin needs to be as free of germs as possible. You can reduce the number of germs on your skin by washing with CHG (chlorahexidine gluconate) Soap before surgery.  CHG is an antiseptic cleaner which kills germs and bonds with the skin to continue killing  germs even after washing.  Please do not use if you have an allergy to CHG or antibacterial soaps. If your skin becomes reddened/irritated stop using the CHG.  Do not shave (including legs and underarms) for at least 48 hours prior to first CHG shower. It is OK to shave your face.  Please follow these instructions carefully.   1. Shower the NIGHT BEFORE SURGERY and the MORNING OF SURGERY with CHG.   2. If you chose to wash your hair, wash your hair first as usual with your normal shampoo.  3. After you shampoo, rinse your hair and body thoroughly to remove the shampoo.  4. Use CHG as you would any other liquid soap. You can apply CHG directly to the skin and wash gently with a scrungie or a clean washcloth.   5. Apply the CHG Soap to your body ONLY FROM THE NECK DOWN.  Do not use on open wounds or open sores. Avoid contact with your eyes, ears, mouth and genitals (private parts). Wash Face and genitals (private parts)  with your normal soap.  6. Wash thoroughly, paying special attention to the area where your surgery will be performed.  7. Thoroughly rinse your body with warm water from the neck down.  8. DO NOT shower/wash with your normal soap after using and rinsing off the CHG Soap.  9. Pat yourself dry with a CLEAN TOWEL.  10. Wear CLEAN PAJAMAS to bed the night before surgery, wear comfortable clothes the morning of surgery  11.  Place CLEAN SHEETS on your bed the night of your first shower and DO NOT SLEEP WITH PETS.  Day of Surgery: Do not apply any deodorants/lotions. Please wear clean clothes to the hospital/surgery center.    Please read over the following fact sheets that you were given. Pain Booklet, Coughing and Deep Breathing, MRSA Information and Surgical Site Infection Prevention

## 2017-09-20 NOTE — Telephone Encounter (Signed)
Pt called wanting to talk to nurse.   Spoke with pt, and was informed that pt will have emergency surgery on her neck on Wed 09/22/17.  Has pinch nerve on neck causing numbness on Left Hand, Left hip and leg, and beginning to affect the Right side.   Pt was instructed by her ortho surgeon to stop Anagrelide for 2 days, and to check with Dr. Burr Medico for further instructions. Dr. Burr Medico spoke with Dr. Sherley Bounds to give clearance for surgery.  Spoke with pt, and informed pt that per Dr. Burr Medico, pt can continue taking Anagrelide since risk of bleeding is minimal.    Pt voiced understanding.

## 2017-09-21 ENCOUNTER — Ambulatory Visit (HOSPITAL_COMMUNITY)
Admission: RE | Admit: 2017-09-21 | Discharge: 2017-09-21 | Disposition: A | Discharge status: Home / Self Care | Payer: Medicare Other | Source: Ambulatory Visit | Attending: Neurological Surgery | Admitting: Neurological Surgery

## 2017-09-21 ENCOUNTER — Encounter (HOSPITAL_COMMUNITY): Payer: Self-pay

## 2017-09-21 ENCOUNTER — Encounter (HOSPITAL_COMMUNITY)
Admission: RE | Admit: 2017-09-21 | Discharge: 2017-09-21 | Disposition: A | Discharge status: Home / Self Care | Payer: Medicare Other | Source: Ambulatory Visit | Attending: Neurological Surgery | Admitting: Neurological Surgery

## 2017-09-21 ENCOUNTER — Other Ambulatory Visit: Payer: Self-pay

## 2017-09-21 DIAGNOSIS — M542 Cervicalgia: Secondary | ICD-10-CM | POA: Insufficient documentation

## 2017-09-21 DIAGNOSIS — Z01810 Encounter for preprocedural cardiovascular examination: Secondary | ICD-10-CM | POA: Insufficient documentation

## 2017-09-21 DIAGNOSIS — Z01812 Encounter for preprocedural laboratory examination: Secondary | ICD-10-CM | POA: Diagnosis not present

## 2017-09-21 DIAGNOSIS — I517 Cardiomegaly: Secondary | ICD-10-CM | POA: Diagnosis not present

## 2017-09-21 HISTORY — DX: Nausea with vomiting, unspecified: R11.2

## 2017-09-21 HISTORY — DX: Leukemia, unspecified not having achieved remission: C95.90

## 2017-09-21 HISTORY — DX: Other complications of anesthesia, initial encounter: T88.59XA

## 2017-09-21 HISTORY — DX: Cervicalgia: M54.2

## 2017-09-21 HISTORY — DX: Other specified postprocedural states: Z98.890

## 2017-09-21 HISTORY — DX: Adverse effect of unspecified anesthetic, initial encounter: T41.45XA

## 2017-09-21 LAB — CBC WITH DIFFERENTIAL/PLATELET
Basophils Absolute: 0 10*3/uL (ref 0.0–0.1)
Basophils Relative: 0 %
Eosinophils Absolute: 0.1 10*3/uL (ref 0.0–0.7)
Eosinophils Relative: 1 %
HEMATOCRIT: 29.6 % — AB (ref 36.0–46.0)
Hemoglobin: 10 g/dL — ABNORMAL LOW (ref 12.0–15.0)
LYMPHS ABS: 1.7 10*3/uL (ref 0.7–4.0)
LYMPHS PCT: 29 %
MCH: 31.2 pg (ref 26.0–34.0)
MCHC: 33.8 g/dL (ref 30.0–36.0)
MCV: 92.2 fL (ref 78.0–100.0)
MONOS PCT: 6 %
Monocytes Absolute: 0.3 10*3/uL (ref 0.1–1.0)
NEUTROS ABS: 3.7 10*3/uL (ref 1.7–7.7)
Neutrophils Relative %: 64 %
Platelets: 370 10*3/uL (ref 150–400)
RBC: 3.21 MIL/uL — ABNORMAL LOW (ref 3.87–5.11)
RDW: 13.3 % (ref 11.5–15.5)
WBC: 5.8 10*3/uL (ref 4.0–10.5)

## 2017-09-21 LAB — BASIC METABOLIC PANEL
Anion gap: 9 (ref 5–15)
BUN: 8 mg/dL (ref 6–20)
CALCIUM: 9.1 mg/dL (ref 8.9–10.3)
CO2: 24 mmol/L (ref 22–32)
CREATININE: 0.7 mg/dL (ref 0.44–1.00)
Chloride: 105 mmol/L (ref 101–111)
GFR calc Af Amer: >60 mL/min (ref >60)
Glucose, Bld: 118 mg/dL — ABNORMAL HIGH (ref 65–99)
Potassium: 3.4 mmol/L — ABNORMAL LOW (ref 3.5–5.1)
SODIUM: 138 mmol/L (ref 135–145)

## 2017-09-21 LAB — SURGICAL PCR SCREEN
MRSA, PCR: NEGATIVE
Staphylococcus aureus: POSITIVE — AB

## 2017-09-21 LAB — PROTIME-INR
INR: 1.06
Prothrombin Time: 13.7 seconds (ref 11.4–15.2)

## 2017-09-21 NOTE — Progress Notes (Signed)
PCP - Dr. Lajuana Carry  Onc- Dr. Burr Medico  Cardiologist - Denies  Chest x-ray - 09/21/17  EKG - 09/21/17  Stress Test - 08/06/09 (E)  ECHO - Denies  Cardiac Cath - Denies  Sleep Study - Denies CPAP - None  LABS- 09/21/17: CBC w/D, BMP, PT  Pt sts her last dose of Aspirin was 3/21 and per Dr. Burr Medico, she is to continue Agrylin for cancer treatment  Anesthesia- No  Pt denies having chest pain, sob, or fever at this time. All instructions explained to the pt, with a verbal understanding of the material. Pt agrees to go over the instructions while at home for a better understanding. The opportunity to ask questions was provided.

## 2017-09-22 ENCOUNTER — Ambulatory Visit (HOSPITAL_COMMUNITY): Payer: Medicare Other | Admitting: Certified Registered Nurse Anesthetist

## 2017-09-22 ENCOUNTER — Ambulatory Visit (HOSPITAL_COMMUNITY)
Admission: RE | Disposition: A | Discharge status: Home / Self Care | Payer: Self-pay | Source: Ambulatory Visit | Attending: Neurological Surgery

## 2017-09-22 ENCOUNTER — Observation Stay (HOSPITAL_COMMUNITY)
Admission: RE | Admit: 2017-09-22 | Discharge: 2017-09-23 | Disposition: A | Discharge status: Home / Self Care | Payer: Medicare Other | Source: Ambulatory Visit | Attending: Neurological Surgery | Admitting: Neurological Surgery

## 2017-09-22 ENCOUNTER — Ambulatory Visit (HOSPITAL_COMMUNITY): Payer: Medicare Other

## 2017-09-22 ENCOUNTER — Encounter (HOSPITAL_COMMUNITY): Payer: Self-pay | Admitting: *Deleted

## 2017-09-22 DIAGNOSIS — J45909 Unspecified asthma, uncomplicated: Secondary | ICD-10-CM | POA: Diagnosis not present

## 2017-09-22 DIAGNOSIS — I10 Essential (primary) hypertension: Secondary | ICD-10-CM | POA: Diagnosis not present

## 2017-09-22 DIAGNOSIS — M4802 Spinal stenosis, cervical region: Principal | ICD-10-CM | POA: Insufficient documentation

## 2017-09-22 DIAGNOSIS — Z885 Allergy status to narcotic agent status: Secondary | ICD-10-CM | POA: Diagnosis not present

## 2017-09-22 DIAGNOSIS — I739 Peripheral vascular disease, unspecified: Secondary | ICD-10-CM | POA: Diagnosis not present

## 2017-09-22 DIAGNOSIS — Z888 Allergy status to other drugs, medicaments and biological substances status: Secondary | ICD-10-CM | POA: Diagnosis not present

## 2017-09-22 DIAGNOSIS — M4322 Fusion of spine, cervical region: Secondary | ICD-10-CM | POA: Diagnosis present

## 2017-09-22 DIAGNOSIS — Z981 Arthrodesis status: Secondary | ICD-10-CM | POA: Diagnosis not present

## 2017-09-22 DIAGNOSIS — Z419 Encounter for procedure for purposes other than remedying health state, unspecified: Secondary | ICD-10-CM

## 2017-09-22 DIAGNOSIS — Z7982 Long term (current) use of aspirin: Secondary | ICD-10-CM | POA: Diagnosis not present

## 2017-09-22 DIAGNOSIS — Z79899 Other long term (current) drug therapy: Secondary | ICD-10-CM | POA: Insufficient documentation

## 2017-09-22 HISTORY — PX: ANTERIOR CERVICAL DECOMP/DISCECTOMY FUSION: SHX1161

## 2017-09-22 SURGERY — ANTERIOR CERVICAL DECOMPRESSION/DISCECTOMY FUSION 1 LEVEL
Anesthesia: General | Site: Spine Cervical

## 2017-09-22 MED ORDER — CEFAZOLIN SODIUM-DEXTROSE 2-4 GM/100ML-% IV SOLN
INTRAVENOUS | Status: AC
  Filled 2017-09-22: qty 100

## 2017-09-22 MED ORDER — LIDOCAINE HCL (CARDIAC) 20 MG/ML IV SOLN
INTRAVENOUS | Status: AC
  Filled 2017-09-22: qty 10

## 2017-09-22 MED ORDER — LOSARTAN POTASSIUM 50 MG PO TABS
100.0000 mg | ORAL_TABLET | Freq: Every day | ORAL | Status: DC
  Administered 2017-09-22: 100 mg via ORAL
  Filled 2017-09-22: qty 2

## 2017-09-22 MED ORDER — SODIUM CHLORIDE 0.9 % IV SOLN: 250.0000 mL | INTRAVENOUS | Status: DC

## 2017-09-22 MED ORDER — ONDANSETRON HCL 4 MG/2ML IJ SOLN: 4.0000 mg | Freq: Four times a day (QID) | INTRAMUSCULAR | Status: DC | PRN

## 2017-09-22 MED ORDER — ACETAMINOPHEN 650 MG RE SUPP: 650.0000 mg | RECTAL | Status: DC | PRN

## 2017-09-22 MED ORDER — AMLODIPINE BESYLATE 5 MG PO TABS: 5.0000 mg | ORAL_TABLET | Freq: Every day | ORAL | Status: DC

## 2017-09-22 MED ORDER — ONDANSETRON HCL 4 MG/2ML IJ SOLN
INTRAMUSCULAR | Status: DC | PRN
  Administered 2017-09-22 (×2): 4 mg via INTRAVENOUS

## 2017-09-22 MED ORDER — GABAPENTIN 100 MG PO CAPS
100.0000 mg | ORAL_CAPSULE | Freq: Three times a day (TID) | ORAL | Status: DC
  Administered 2017-09-22 (×2): 100 mg via ORAL
  Filled 2017-09-22 (×2): qty 1

## 2017-09-22 MED ORDER — DEXAMETHASONE SODIUM PHOSPHATE 10 MG/ML IJ SOLN
INTRAMUSCULAR | Status: AC
Start: 2017-09-22 — End: 2017-09-22
  Filled 2017-09-22: qty 2

## 2017-09-22 MED ORDER — MIDAZOLAM HCL 5 MG/5ML IJ SOLN
INTRAMUSCULAR | Status: DC | PRN
  Administered 2017-09-22: 2 mg via INTRAVENOUS

## 2017-09-22 MED ORDER — PROPOFOL 10 MG/ML IV BOLUS
INTRAVENOUS | Status: AC
  Filled 2017-09-22: qty 20

## 2017-09-22 MED ORDER — CEFAZOLIN SODIUM-DEXTROSE 2-4 GM/100ML-% IV SOLN
2.0000 g | INTRAVENOUS | Status: AC
  Administered 2017-09-22: 2 g via INTRAVENOUS

## 2017-09-22 MED ORDER — 0.9 % SODIUM CHLORIDE (POUR BTL) OPTIME
TOPICAL | Status: DC | PRN
  Administered 2017-09-22: 1000 mL

## 2017-09-22 MED ORDER — SUGAMMADEX SODIUM 200 MG/2ML IV SOLN
INTRAVENOUS | Status: DC | PRN
  Administered 2017-09-22: 200 mg via INTRAVENOUS

## 2017-09-22 MED ORDER — BUPIVACAINE HCL (PF) 0.25 % IJ SOLN
INTRAMUSCULAR | Status: DC | PRN
  Administered 2017-09-22: 3 mL

## 2017-09-22 MED ORDER — ACETAMINOPHEN 10 MG/ML IV SOLN
INTRAVENOUS | Status: AC
  Administered 2017-09-22: 1000 mg via INTRAVENOUS
  Filled 2017-09-22: qty 100

## 2017-09-22 MED ORDER — DEXAMETHASONE SODIUM PHOSPHATE 10 MG/ML IJ SOLN: 10.0000 mg | INTRAMUSCULAR | Status: DC

## 2017-09-22 MED ORDER — PHENYLEPHRINE 40 MCG/ML (10ML) SYRINGE FOR IV PUSH (FOR BLOOD PRESSURE SUPPORT)
PREFILLED_SYRINGE | INTRAVENOUS | Status: AC
  Filled 2017-09-22: qty 20

## 2017-09-22 MED ORDER — METHOCARBAMOL 1000 MG/10ML IJ SOLN
500.0000 mg | Freq: Four times a day (QID) | INTRAVENOUS | Status: DC | PRN
  Filled 2017-09-22: qty 5

## 2017-09-22 MED ORDER — ONDANSETRON HCL 4 MG/2ML IJ SOLN
INTRAMUSCULAR | Status: AC
Start: 2017-09-22 — End: 2017-09-22
  Filled 2017-09-22: qty 4

## 2017-09-22 MED ORDER — POTASSIUM CHLORIDE IN NACL 20-0.9 MEQ/L-% IV SOLN
INTRAVENOUS | Status: DC
  Filled 2017-09-22: qty 1000

## 2017-09-22 MED ORDER — PROPOFOL 10 MG/ML IV BOLUS
INTRAVENOUS | Status: DC | PRN
  Administered 2017-09-22: 100 mg via INTRAVENOUS

## 2017-09-22 MED ORDER — LOSARTAN POTASSIUM-HCTZ 100-25 MG PO TABS: 1.0000 | ORAL_TABLET | Freq: Every day | ORAL | Status: DC

## 2017-09-22 MED ORDER — ALBUTEROL SULFATE (2.5 MG/3ML) 0.083% IN NEBU
3.0000 mL | INHALATION_SOLUTION | RESPIRATORY_TRACT | Status: DC | PRN
Start: 2017-09-22 — End: 2017-09-23

## 2017-09-22 MED ORDER — CHLORHEXIDINE GLUCONATE CLOTH 2 % EX PADS: 6.0000 | MEDICATED_PAD | Freq: Once | CUTANEOUS | Status: DC

## 2017-09-22 MED ORDER — SODIUM CHLORIDE 0.9% FLUSH
3.0000 mL | Freq: Two times a day (BID) | INTRAVENOUS | Status: DC
  Administered 2017-09-22: 3 mL via INTRAVENOUS

## 2017-09-22 MED ORDER — BUPIVACAINE HCL (PF) 0.25 % IJ SOLN
INTRAMUSCULAR | Status: AC
  Filled 2017-09-22: qty 30

## 2017-09-22 MED ORDER — PHENYLEPHRINE HCL 10 MG/ML IJ SOLN
INTRAMUSCULAR | Status: DC | PRN
  Administered 2017-09-22: 80 ug via INTRAVENOUS

## 2017-09-22 MED ORDER — LIDOCAINE HCL (CARDIAC) 20 MG/ML IV SOLN
INTRAVENOUS | Status: DC | PRN
  Administered 2017-09-22: 40 mg via INTRAVENOUS

## 2017-09-22 MED ORDER — LACTATED RINGERS IV SOLN
INTRAVENOUS | Status: DC
  Administered 2017-09-22: 10:00:00 via INTRAVENOUS

## 2017-09-22 MED ORDER — HEMOSTATIC AGENTS (NO CHARGE) OPTIME
TOPICAL | Status: DC | PRN
  Administered 2017-09-22: 1 via TOPICAL

## 2017-09-22 MED ORDER — ROCURONIUM BROMIDE 100 MG/10ML IV SOLN
INTRAVENOUS | Status: DC | PRN
  Administered 2017-09-22: 50 mg via INTRAVENOUS
  Administered 2017-09-22: 10 mg via INTRAVENOUS

## 2017-09-22 MED ORDER — METHOCARBAMOL 500 MG PO TABS
500.0000 mg | ORAL_TABLET | Freq: Four times a day (QID) | ORAL | Status: DC | PRN
  Administered 2017-09-22 – 2017-09-23 (×2): 500 mg via ORAL
  Filled 2017-09-22 (×2): qty 1

## 2017-09-22 MED ORDER — HYDROCHLOROTHIAZIDE 25 MG PO TABS
25.0000 mg | ORAL_TABLET | Freq: Every day | ORAL | Status: DC
  Administered 2017-09-22: 25 mg via ORAL
  Filled 2017-09-22: qty 1

## 2017-09-22 MED ORDER — PHENOL 1.4 % MT LIQD: 1.0000 | OROMUCOSAL | Status: DC | PRN

## 2017-09-22 MED ORDER — ACETAMINOPHEN 10 MG/ML IV SOLN
1000.0000 mg | Freq: Once | INTRAVENOUS | Status: DC | PRN
  Administered 2017-09-22: 1000 mg via INTRAVENOUS

## 2017-09-22 MED ORDER — FENTANYL CITRATE (PF) 100 MCG/2ML IJ SOLN
INTRAMUSCULAR | Status: DC | PRN
  Administered 2017-09-22: 50 ug via INTRAVENOUS
  Administered 2017-09-22: 25 ug via INTRAVENOUS
  Administered 2017-09-22 (×2): 50 ug via INTRAVENOUS

## 2017-09-22 MED ORDER — ROCURONIUM BROMIDE 10 MG/ML (PF) SYRINGE
PREFILLED_SYRINGE | INTRAVENOUS | Status: AC
  Filled 2017-09-22: qty 10

## 2017-09-22 MED ORDER — FENTANYL CITRATE (PF) 100 MCG/2ML IJ SOLN: 25.0000 ug | INTRAMUSCULAR | Status: DC | PRN

## 2017-09-22 MED ORDER — FENTANYL CITRATE (PF) 250 MCG/5ML IJ SOLN
INTRAMUSCULAR | Status: AC
  Filled 2017-09-22: qty 5

## 2017-09-22 MED ORDER — MOMETASONE FURO-FORMOTEROL FUM 100-5 MCG/ACT IN AERO
2.0000 | INHALATION_SPRAY | Freq: Two times a day (BID) | RESPIRATORY_TRACT | Status: DC
  Administered 2017-09-22: 2 via RESPIRATORY_TRACT
  Filled 2017-09-22: qty 8.8

## 2017-09-22 MED ORDER — THROMBIN (RECOMBINANT) 5000 UNITS EX SOLR
OROMUCOSAL | Status: DC | PRN
  Administered 2017-09-22: 5 mL via TOPICAL

## 2017-09-22 MED ORDER — OXYCODONE HCL 5 MG PO TABS
5.0000 mg | ORAL_TABLET | Freq: Four times a day (QID) | ORAL | Status: DC | PRN
  Administered 2017-09-22 – 2017-09-23 (×3): 5 mg via ORAL
  Filled 2017-09-22 (×3): qty 1

## 2017-09-22 MED ORDER — HYDROMORPHONE HCL 1 MG/ML IJ SOLN: 0.5000 mg | INTRAMUSCULAR | Status: DC | PRN

## 2017-09-22 MED ORDER — MIDAZOLAM HCL 2 MG/2ML IJ SOLN
INTRAMUSCULAR | Status: AC
  Filled 2017-09-22: qty 2

## 2017-09-22 MED ORDER — MENTHOL 3 MG MT LOZG: 1.0000 | LOZENGE | OROMUCOSAL | Status: DC | PRN

## 2017-09-22 MED ORDER — SODIUM CHLORIDE 0.9 % IR SOLN
Status: DC | PRN
  Administered 2017-09-22: 500 mL

## 2017-09-22 MED ORDER — ONDANSETRON HCL 4 MG PO TABS
4.0000 mg | ORAL_TABLET | Freq: Four times a day (QID) | ORAL | Status: DC | PRN
  Filled 2017-09-22: qty 1

## 2017-09-22 MED ORDER — DEXAMETHASONE SODIUM PHOSPHATE 4 MG/ML IJ SOLN
INTRAMUSCULAR | Status: DC | PRN
  Administered 2017-09-22: 10 mg via INTRAVENOUS

## 2017-09-22 MED ORDER — MEPERIDINE HCL 50 MG/ML IJ SOLN: 6.2500 mg | INTRAMUSCULAR | Status: DC | PRN

## 2017-09-22 MED ORDER — SENNA 8.6 MG PO TABS
1.0000 | ORAL_TABLET | Freq: Two times a day (BID) | ORAL | Status: DC
  Administered 2017-09-22: 8.6 mg via ORAL
  Filled 2017-09-22: qty 1

## 2017-09-22 MED ORDER — CEFAZOLIN SODIUM-DEXTROSE 2-4 GM/100ML-% IV SOLN
2.0000 g | Freq: Three times a day (TID) | INTRAVENOUS | Status: AC
Start: 2017-09-22 — End: 2017-09-23
  Administered 2017-09-22 – 2017-09-23 (×2): 2 g via INTRAVENOUS
  Filled 2017-09-22 (×2): qty 100

## 2017-09-22 MED ORDER — PROMETHAZINE HCL 25 MG/ML IJ SOLN
6.2500 mg | INTRAMUSCULAR | Status: DC | PRN
Start: 2017-09-22 — End: 2017-09-22

## 2017-09-22 MED ORDER — THROMBIN 5000 UNITS EX SOLR
CUTANEOUS | Status: AC
  Filled 2017-09-22: qty 5000

## 2017-09-22 MED ORDER — ACETAMINOPHEN 325 MG PO TABS: 650.0000 mg | ORAL_TABLET | ORAL | Status: DC | PRN

## 2017-09-22 MED ORDER — SODIUM CHLORIDE 0.9% FLUSH: 3.0000 mL | INTRAVENOUS | Status: DC | PRN

## 2017-09-22 SURGICAL SUPPLY — 56 items
BAG DECANTER FOR FLEXI CONT (MISCELLANEOUS) ×2 IMPLANT
BASKET BONE COLLECTION (BASKET) ×2 IMPLANT
BENZOIN TINCTURE PRP APPL 2/3 (GAUZE/BANDAGES/DRESSINGS) ×2 IMPLANT
BIT DRILL 2.3 12 FIXED (INSTRUMENTS) ×1 IMPLANT
BUR MATCHSTICK NEURO 3.0 LAGG (BURR) ×2 IMPLANT
CANISTER SUCT 3000ML PPV (MISCELLANEOUS) ×2 IMPLANT
CARTRIDGE OIL MAESTRO DRILL (MISCELLANEOUS) ×1 IMPLANT
DIFFUSER DRILL AIR PNEUMATIC (MISCELLANEOUS) ×2 IMPLANT
DRAPE C-ARM 42X72 X-RAY (DRAPES) ×4 IMPLANT
DRAPE LAPAROTOMY (DRAPES) IMPLANT
DRAPE LAPAROTOMY 100X72 PEDS (DRAPES) ×2 IMPLANT
DRAPE MICROSCOPE LEICA (MISCELLANEOUS) ×2 IMPLANT
DRILL 12MM (INSTRUMENTS) ×2
DRSG OPSITE POSTOP 3X4 (GAUZE/BANDAGES/DRESSINGS) ×2 IMPLANT
DURAPREP 6ML APPLICATOR 50/CS (WOUND CARE) ×2 IMPLANT
ELECT COATED BLADE 2.86 ST (ELECTRODE) ×2 IMPLANT
ELECT REM PT RETURN 9FT ADLT (ELECTROSURGICAL) ×2
ELECTRODE REM PT RTRN 9FT ADLT (ELECTROSURGICAL) ×1 IMPLANT
GAUZE SPONGE 4X4 16PLY XRAY LF (GAUZE/BANDAGES/DRESSINGS) IMPLANT
GLOVE BIO SURGEON STRL SZ7 (GLOVE) ×2 IMPLANT
GLOVE BIO SURGEON STRL SZ8 (GLOVE) ×2 IMPLANT
GLOVE BIOGEL PI IND STRL 6.5 (GLOVE) ×1 IMPLANT
GLOVE BIOGEL PI IND STRL 7.0 (GLOVE) ×1 IMPLANT
GLOVE BIOGEL PI INDICATOR 6.5 (GLOVE) ×1
GLOVE BIOGEL PI INDICATOR 7.0 (GLOVE) ×1
GLOVE SURG SS PI 6.0 STRL IVOR (GLOVE) ×2 IMPLANT
GLOVE SURG SS PI 7.5 STRL IVOR (GLOVE) ×6 IMPLANT
GOWN STRL REUS W/ TWL LRG LVL3 (GOWN DISPOSABLE) ×3 IMPLANT
GOWN STRL REUS W/ TWL XL LVL3 (GOWN DISPOSABLE) ×1 IMPLANT
GOWN STRL REUS W/TWL 2XL LVL3 (GOWN DISPOSABLE) IMPLANT
GOWN STRL REUS W/TWL LRG LVL3 (GOWN DISPOSABLE) ×3
GOWN STRL REUS W/TWL XL LVL3 (GOWN DISPOSABLE) ×1
HEMOSTAT POWDER KIT SURGIFOAM (HEMOSTASIS) ×2 IMPLANT
KIT BASIN OR (CUSTOM PROCEDURE TRAY) ×2 IMPLANT
KIT TURNOVER KIT B (KITS) ×2 IMPLANT
NEEDLE HYPO 25X1 1.5 SAFETY (NEEDLE) ×2 IMPLANT
NEEDLE SPNL 20GX3.5 QUINCKE YW (NEEDLE) ×2 IMPLANT
NS IRRIG 1000ML POUR BTL (IV SOLUTION) ×2 IMPLANT
OIL CARTRIDGE MAESTRO DRILL (MISCELLANEOUS) ×2
PACK LAMINECTOMY NEURO (CUSTOM PROCEDURE TRAY) ×2 IMPLANT
PAD ARMBOARD 7.5X6 YLW CONV (MISCELLANEOUS) ×6 IMPLANT
PIN DISTRACTION 14MM (PIN) ×4 IMPLANT
PLATE 14MM (Plate) ×2 IMPLANT
PLATE TRESTLE LUXE 12 1LVL (Plate) ×2 IMPLANT
RUBBERBAND STERILE (MISCELLANEOUS) ×4 IMPLANT
SCREW CANN 4X16 SS S/DRILL (Screw) ×8 IMPLANT
SPACER ACF PARALLEL 7MM (Bone Implant) ×2 IMPLANT
SPACER PTI-C 7X16X14 10D (Spacer) ×2 IMPLANT
SPONGE INTESTINAL PEANUT (DISPOSABLE) ×2 IMPLANT
SPONGE SURGIFOAM ABS GEL SZ50 (HEMOSTASIS) ×2 IMPLANT
STRIP CLOSURE SKIN 1/2X4 (GAUZE/BANDAGES/DRESSINGS) ×2 IMPLANT
SUT VIC AB 3-0 SH 8-18 (SUTURE) ×2 IMPLANT
SUT VIC AB 4-0 PS2 27 (SUTURE) ×2 IMPLANT
TOWEL GREEN STERILE (TOWEL DISPOSABLE) ×2 IMPLANT
TOWEL GREEN STERILE FF (TOWEL DISPOSABLE) ×2 IMPLANT
WATER STERILE IRR 1000ML POUR (IV SOLUTION) ×2 IMPLANT

## 2017-09-22 NOTE — Anesthesia Postprocedure Evaluation (Signed)
Anesthesia Post Note  Patient: Paula Griffith  Procedure(s) Performed: ANTERIOR CERVICAL DECOMPRESSION AND FUSION CERVICAL THREE-FOUR. (N/A Spine Cervical)     Patient location during evaluation: PACU Anesthesia Type: General Level of consciousness: sedated Pain management: pain level controlled Vital Signs Assessment: post-procedure vital signs reviewed and stable Respiratory status: spontaneous breathing Cardiovascular status: stable Postop Assessment: no apparent nausea or vomiting Anesthetic complications: no    Last Vitals:  Vitals:   09/22/17 1340 09/22/17 1355  BP: (!) 156/69 (!) 156/72  Pulse: 81 76  Resp: (!) 31 15  Temp:    SpO2: 100% 99%    Last Pain:  Vitals:   09/22/17 1355  TempSrc:   PainSc: Asleep   Pain Goal: Patients Stated Pain Goal: 3 (09/22/17 0924)               Nicolet Griffy JR,JOHN Mateo Flow

## 2017-09-22 NOTE — Transfer of Care (Signed)
Immediate Anesthesia Transfer of Care Note  Patient: Paula Griffith  Procedure(s) Performed: ANTERIOR CERVICAL DECOMPRESSION AND FUSION CERVICAL THREE-FOUR. (N/A Spine Cervical)  Patient Location: PACU  Anesthesia Type:General  Level of Consciousness: awake and alert   Airway & Oxygen Therapy: Patient Spontanous Breathing and Patient connected to nasal cannula oxygen  Post-op Assessment: Report given to RN, Post -op Vital signs reviewed and stable and Patient moving all extremities  Post vital signs: Reviewed and stable  Last Vitals:  Vitals Value Taken Time  BP 162/81 09/22/2017  1:25 PM  Temp    Pulse 88 09/22/2017  1:26 PM  Resp 14 09/22/2017  1:26 PM  SpO2 99 % 09/22/2017  1:26 PM  Vitals shown include unvalidated device data.  Last Pain:  Vitals:   09/22/17 0924  TempSrc:   PainSc: 6       Patients Stated Pain Goal: 3 (93/81/82 9937)  Complications: No apparent anesthesia complications

## 2017-09-22 NOTE — Op Note (Signed)
09/22/2017  1:29 PM  PATIENT:  Paula Griffith  72 y.o. female  PRE-OPERATIVE DIAGNOSIS:  Cervical stenosis C3-4  POST-OPERATIVE DIAGNOSIS:  same  PROCEDURE:  1. Decompressive anterior cervical discectomy C3-4, 2. Anterior cervical arthrodesis C3-4 rising a cortical cancellus bone graft, 3. Anterior cervical plating C3-4 utilizing a Alphatec plate  SURGEON:  Sherley Bounds, MD  ASSISTANTS: Meyran FNP  ANESTHESIA:   General  EBL: 50 ml  Total I/O In: 800 [I.V.:800] Out: 50 [Blood:50]  BLOOD ADMINISTERED: none  DRAINS: none  SPECIMEN:  none  INDICATION FOR PROCEDURE: This patient presented with neck pain with numbness and tingling in her hands. Imaging showed severe spinal stenosis C3-4. The patient tried conservative measures without relief. Pain was debilitating. Recommended ACDF with plating. Patient understood the risks, benefits, and alternatives and potential outcomes and wished to proceed.  PROCEDURE DETAILS: Patient was brought to the operating room placed under general endotracheal anesthesia. Patient was placed in the supine position on the operating room table. The neck was prepped with Duraprep and draped in a sterile fashion.   Three cc of local anesthesia was injected and a transverse incision was made on the right side of the neck.  Dissection was carried down thru the subcutaneous tissue and the platysma was  elevated, opened, and undermined with Metzenbaum scissors.  Dissection was then carried out thru an avascular plane leaving the sternocleidomastoid carotid artery and jugular vein laterally and the trachea and esophagus medially. The ventral aspect of the vertebral column was identified and a localizing x-ray was taken. The C3-4 level was identified. The longus colli muscles were then elevated and the retractor was placed. The annulus was incised and the disc space entered. Discectomy was performed with micro-curettes and pituitary rongeurs. I then used the high-speed  drill to drill the endplates down to the level of the posterior longitudinal ligament. The drill shavings were saved in a mucous trap for later arthrodesis. The operating microscope was draped and brought into the field provided additional magnification, illumination and visualization. Discectomy was continued posteriorly thru the disc space. Posterior longitudinal ligament was opened with a nerve hook, and then removed along with disc herniation and osteophytes, decompressing the spinal canal and thecal sac. We then continued to remove osteophytic overgrowth and disc material decompressing the neural foramina and exiting nerve roots bilaterally. The scope was angled up and down to help decompress and undercut the vertebral bodies. Once the decompression was completed we could pass a nerve hook circumferentially to assure adequate decompression in the midline and in the neural foramina. So by both visualization and palpation we felt we had an adequate decompression of the neural elements. We then measured the height of the intravertebral disc space and selected a 7 millimeter cortical cancellus allograft. It was then gently positioned in the intravertebral disc space(s) and countersunk. I then used a 12 mm Alphatec plate and placed variable angle screws into the vertebral bodies of each level and locked them into position. The wound was irrigated with bacitracin solution, checked for hemostasis which was established and confirmed. Once meticulous hemostasis was achieved, we then proceeded with closure. The platysma was closed with interrupted 3-0 undyed Vicryl suture, the subcuticular layer was closed with interrupted 3-0 undyed Vicryl suture. The skin edges were approximated with steristrips. The drapes were removed. A sterile dressing was applied. The patient was then awakened from general anesthesia and transferred to the recovery room in stable condition. At the end of the procedure all sponge, needle  and  instrument counts were correct.   PLAN OF CARE: Admit to inpatient   PATIENT DISPOSITION:  PACU - hemodynamically stable.   Delay start of Pharmacological VTE agent (>24hrs) due to surgical blood loss or risk of bleeding:  yes

## 2017-09-22 NOTE — Evaluation (Signed)
Physical Therapy Evaluation Patient Details Name: Paula Griffith MRN: [id number] DOB: May 29, 1946 Today's Date: 09/22/2017   History of Present Illness  Pt is a 72 y.o. female s/p ACDF C3-4 on 09/22/17. PMH includes PVD, OA, HTN, asthma.  Clinical Impression  Pt presents with an overall decrease in functional mobility secondary to above. PTA, pt indep and lives with grandson; daughter plans to stay with pt at d/c to provide 24/7 support. Educ on precautions, positioning, and importance of mobility. Today, pt able to amb and initiate stair training with no DME and supervision for safety. Would benefit from an additional acute PT session to maximize functional mobility and independence prior to return home.    Follow Up Recommendations No PT follow up;Supervision for mobility/OOB    Equipment Recommendations  None recommended by PT    Recommendations for Other Services       Precautions / Restrictions Precautions Precautions: Cervical Precaution Booklet Issued: Yes (comment) Precaution Comments: Verbally reviewed precautions Restrictions Weight Bearing Restrictions: No      Mobility  Bed Mobility Overal bed mobility: Independent                Transfers Overall transfer level: Needs assistance   Transfers: Sit to/from Stand Sit to Stand: Supervision            Ambulation/Gait Ambulation/Gait assistance: Supervision Ambulation Distance (Feet): 200 Feet Assistive device: None Gait Pattern/deviations: Step-through pattern;Decreased stride length Gait velocity: Decreased Gait velocity interpretation: Below normal speed for age/gender General Gait Details: Slow, controlled amb with no DME and supervision for safety. Intermittent attempts to twist neck to look sideways, requiring cues for precautions and safe technique  Stairs Stairs: Yes Stairs assistance: Supervision   Number of Stairs: 3 General stair comments: Simulated ascending steps by high marching in  place; pt able to do so with and without HHA   Wheelchair Mobility    Modified Rankin (Stroke Patients Only)       Balance Overall balance assessment: Needs assistance   Sitting balance-Leahy Scale: Good       Standing balance-Leahy Scale: Good                               Pertinent Vitals/Pain Pain Assessment: No/denies pain    Home Living Family/patient expects to be discharged to:: Private residence Living Arrangements: Other relatives(Grandson) Available Help at Discharge: Family;Available 24 hours/day Type of Home: House Home Access: Stairs to enter Entrance Stairs-Rails: None Entrance Stairs-Number of Steps: 2 Home Layout: One level Home Equipment: Walker - 2 wheels;Cane - single point Additional Comments: Daughter plans to stay with pt initially to provide 24/7 support    Prior Function Level of Independence: Independent         Comments: Indep with mobility. Drives. 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, newspaper        Extremity/Trunk Assessment   Upper Extremity Assessment Upper Extremity Assessment: LUE deficits/detail LUE Coordination: decreased fine motor(pt reports improvement since prior to sx)    Lower Extremity Assessment Lower Extremity Assessment: Overall WFL for tasks assessed    Cervical / Trunk Assessment Cervical / Trunk Assessment: Normal  Communication   Communication: No difficulties  Cognition Arousal/Alertness: Awake/alert Behavior During Therapy: WFL for tasks assessed/performed Overall Cognitive Status: Within Functional Limits for tasks assessed  General Comments General comments (skin integrity, edema, etc.): Multiple family members present, including daughter staying with pt at d/c    Exercises     Assessment/Plan    PT Assessment Patient needs continued PT services  PT Problem List Decreased balance;Decreased mobility        PT Treatment Interventions Gait training;Stair training;Functional mobility training;Balance training;Therapeutic activities    PT Goals (Current goals can be found in the Care Plan section)  Acute Rehab PT Goals Patient Stated Goal: Return home PT Goal Formulation: With patient Time For Goal Achievement: 10/06/17 Potential to Achieve Goals: Good    Frequency Min 5X/week   Barriers to discharge        Co-evaluation               AM-PAC PT "6 Clicks" Daily Activity  Outcome Measure Difficulty turning over in bed (including adjusting bedclothes, sheets and blankets)?: None Difficulty moving from lying on back to sitting on the side of the bed? : None Difficulty sitting down on and standing up from a chair with arms (e.g., wheelchair, bedside commode, etc,.)?: None Help needed moving to and from a bed to chair (including a wheelchair)?: None Help needed walking in hospital room?: A Little Help needed climbing 3-5 steps with a railing? : A Little 6 Click Score: 22    End of Session Equipment Utilized During Treatment: Gait belt Activity Tolerance: Patient tolerated treatment well Patient left: in bed;with call bell/phone within reach;with family/visitor present;with SCD's reapplied Nurse Communication: Mobility status PT Visit Diagnosis: Other abnormalities of gait and mobility (R26.89)    Time: 0737-1062 PT Time Calculation (min) (ACUTE ONLY): 17 min   Charges:   PT Evaluation $PT Eval Moderate Complexity: 1 Mod     PT G Codes:       Mabeline Caras, PT, DPT Acute Rehab Services  Pager: [place] 09/22/2017, 5:32 PM

## 2017-09-22 NOTE — Anesthesia Preprocedure Evaluation (Signed)
Anesthesia Evaluation  Patient identified by MRN, date of birth, ID band Patient awake    Reviewed: Allergy & Precautions, NPO status , Patient's Chart, lab work & pertinent test results  History of Anesthesia Complications (+) PONV and history of anesthetic complications  Airway Mallampati: I       Dental  (+) Edentulous Upper, Edentulous Lower   Pulmonary    Pulmonary exam normal        Cardiovascular hypertension, Pt. on medications Normal cardiovascular exam Rhythm:Regular Rate:Normal     Neuro/Psych negative psych ROS   GI/Hepatic negative GI ROS, Neg liver ROS,   Endo/Other  negative endocrine ROS  Renal/GU negative Renal ROS  negative genitourinary   Musculoskeletal  (+) Arthritis , Osteoarthritis,    Abdominal Normal abdominal exam  (+)   Peds  Hematology  (+) Blood dyscrasia, anemia ,   Anesthesia Other Findings   Reproductive/Obstetrics                             Anesthesia Physical Anesthesia Plan  ASA: II  Anesthesia Plan: General   Post-op Pain Management:    Induction: Intravenous  PONV Risk Score and Plan: 4 or greater and Ondansetron, Dexamethasone and Treatment may vary due to age or medical condition  Airway Management Planned: Oral ETT  Additional Equipment:   Intra-op Plan:   Post-operative Plan: Extubation in OR  Informed Consent: I have reviewed the patients History and Physical, chart, labs and discussed the procedure including the risks, benefits and alternatives for the proposed anesthesia with the patient or authorized representative who has indicated his/her understanding and acceptance.   Dental advisory given  Plan Discussed with: CRNA and Surgeon  Anesthesia Plan Comments:         Anesthesia Quick Evaluation

## 2017-09-22 NOTE — Progress Notes (Signed)
Patient ID: Paula Griffith, female   DOB: August 10, 1945, 72 y.o.   MRN: [id number] Looks great, no pain, hands better, hs walked, eating dinner

## 2017-09-22 NOTE — H&P (Signed)
Subjective:   Patient is a 72 y.o. female admitted for cervical stenosis. The patient first presented to me with complaints of neck pain and numbness of the arm(s). Onset of symptoms was several months ago. The pain is described as aching and occurs all day. The pain is rated moderate, and is located in the neck. The symptoms have been progressive. Symptoms are exacerbated by extending head backwards, and are relieved by none.  Previous work up includes MRI of cervical spine, results: spinal stenosis.  Past Medical History:  Diagnosis Date  . Allergic rhinitis   . Asthma   . Cervicalgia   . Colon polyps   . Complication of anesthesia   . Diverticulosis of colon   . Glucose intolerance (impaired glucose tolerance)   . HTN (hypertension)   . LBP (low back pain)   . Leukemia ([place])   . Osteoarthritis   . PONV (postoperative nausea and vomiting)   . PVD (peripheral vascular disease) ([place]) 2011   Right Carotid Dr Scot Dock  . 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 Thrombocytopathy [facility]) 2011   Dr Ralene Ok    Past Surgical History:  Procedure Laterality Date  . ABDOMINAL HYSTERECTOMY  1989  . BREAST EXCISIONAL BIOPSY Left   . BREAST SURGERY     Left    Allergies  Allergen Reactions  . Benazepril Cough    cough  . Codeine Nausea Only  . Tramadol Other (See Comments)    Dizzy, nauseated    Social History   Tobacco Use  . Smoking status: Never Smoker  . Smokeless tobacco: Never Used  Substance Use Topics  . Alcohol use: No    Alcohol/week: 0.0 oz    Family History  Problem Relation Age of Onset  . Pancreatic cancer Mother   . Arthritis Mother        RA  . Early death Father 38       MI  . Coronary artery disease Unknown        Female 1st degree relative Female <60  . Lung cancer Unknown   . Diabetes Sister   . Breast cancer Maternal Grandmother   . Colon cancer Neg Hx   . Stomach cancer Neg Hx    Prior to Admission medications   Medication Sig Start Date End Date  Taking? Authorizing Provider  albuterol (PROAIR HFA) 108 (90 Base) MCG/ACT inhaler Inhale 2 puffs into the lungs every 4 (four) hours as needed. For shortness of breath 04/20/16  Yes [name], [name], MD  amLODipine (NORVASC) 5 MG tablet TAKE 1 TABLET BY MOUTH EVERY DAY 07/05/17  Yes [name], [name], MD  anagrelide (AGRYLIN) 0.5 MG capsule TAKE ONE CAPSULE TWICE A DAY MON/WEDS/FRI AND TAKE ONE CAPSULE EVERY DAY ALL OTHER DAYS 08/25/17  Yes Truitt Merle, MD  Ascorbic Acid (VITAMIN C) 1000 MG tablet Take 1,000 mg by mouth daily.   Yes [provider]  aspirin 325 MG EC tablet Take 325 mg by mouth daily after breakfast.     Yes [provider]  Biotin (BIOTIN MAXIMUM STRENGTH) 10 MG TABS Take 1 tablet by mouth daily.    Yes [provider]  Cholecalciferol 1000 UNITS tablet Take 1,000 Units by mouth daily.     Yes [provider]  gabapentin (NEURONTIN) 100 MG capsule Take 1 capsule (100 mg total) by mouth 3 (three) times daily. 06/09/17  Yes [name], [name], MD  ibuprofen (ADVIL,MOTRIN) 600 MG tablet TAKE 1 TABLET (600 MG TOTAL)  BY MOUTH 2 (TWO) TIMES DAILY AS NEEDED. 04/27/17  Yes [name], [name], MD  KLOR-CON 10 10 MEQ tablet TAKE 1 TABLET BY MOUTH ONCE DAILY 03/15/17  Yes [name], [name], MD  losartan-hydrochlorothiazide (HYZAAR) 100-25 MG tablet TAKE 1 TABLET BY MOUTH DAILY. 03/15/17  Yes [name], [name], MD  Multiple Vitamin (MULTIVITAMIN) capsule Take 2 capsules by mouth daily.    Yes [provider]  budesonide-formoterol (SYMBICORT) 80-4.5 MCG/ACT inhaler Inhale 2 puffs into the lungs 2 (two) times daily. 04/20/16   [name], [name], MD     Review of Systems  Positive ROS: neg  All other systems have been reviewed and were otherwise negative with the exception of those mentioned in the HPI and as above.  Objective: Vital signs in last 24 hours: Temp:  [98.5 F (36.9 C)-98.9 F (37.2 C)] 98.5 F (36.9 C) (03/27  0919) Pulse Rate:  [73-87] 73 (03/27 0919) Resp:  [16-20] 16 (03/27 0919) BP: (152-171)/(64-72) 171/64 (03/27 0919) SpO2:  [98 %-100 %] 100 % (03/27 0919) Weight:  [67 kg (147 lb 9.6 oz)] 67 kg (147 lb 9.6 oz) (03/26 1111)  General Appearance: Alert, cooperative, no distress, appears stated age Head: Normocephalic, without obvious abnormality, atraumatic Eyes: PERRL, conjunctiva/corneas clear, EOM's intact      Neck: Supple, symmetrical, trachea midline, Back: Symmetric, no curvature, ROM normal, no CVA tenderness Lungs:  respirations unlabored Heart: Regular rate and rhythm Abdomen: Soft, non-tender Extremities: Extremities normal, atraumatic, no cyanosis or edema Pulses: 2+ and symmetric all extremities Skin: Skin color, texture, turgor normal, no rashes or lesions  NEUROLOGIC:  Mental status: Alert and oriented x4, no aphasia, good attention span, fund of knowledge and memory  Motor Exam - grossly normal Sensory Exam - grossly normal Reflexes: trace Coordination - grossly normal Gait - grossly normal Balance - grossly normal Cranial Nerves: I: smell Not tested  II: visual acuity  OS: nl    OD: nl  II: visual fields Full to confrontation  II: pupils Equal, round, reactive to light  III,VII: ptosis None  III,IV,VI: extraocular muscles  Full ROM  V: mastication Normal  V: facial light touch sensation  Normal  V,VII: corneal reflex  Present  VII: facial muscle function - upper  Normal  VII: facial muscle function - lower Normal  VIII: hearing Not tested  IX: soft palate elevation  Normal  IX,X: gag reflex Present  XI: trapezius strength  5/5  XI: sternocleidomastoid strength 5/5  XI: neck flexion strength  5/5  XII: tongue strength  Normal    Data Review Lab Results  Component Value Date   WBC 5.8 09/21/2017   HGB 10.0 (L) 09/21/2017   HCT 29.6 (L) 09/21/2017   MCV 92.2 09/21/2017   PLT 370 09/21/2017   Lab Results  Component Value Date   NA 138 09/21/2017    K 3.4 (L) 09/21/2017   CL 105 09/21/2017   CO2 24 09/21/2017   BUN 8 09/21/2017   CREATININE 0.70 09/21/2017   GLUCOSE 118 (H) 09/21/2017   Lab Results  Component Value Date   INR 1.06 09/21/2017    Assessment:   Cervical neck pain with herniated nucleus pulposus/ spondylosis/ stenosis at C3-4. Estimated body mass index is 27 kg/m as calculated from the following:   Height as of 09/21/17: 5\' 2"  (1.575 m).   Weight as of 09/21/17: 67 kg (147 lb 9.6 oz).  Patient has failed conservative therapy. Planned surgery : ACDF C3-4  Plan:  I explained the condition and procedure to the patient and answered any questions.  Patient wishes to proceed with procedure as planned. Understands risks/ benefits/ and expected or typical outcomes.  JONES,DAVID S 09/22/2017 10:18 AM

## 2017-09-22 NOTE — Anesthesia Procedure Notes (Signed)
Procedure Name: Intubation Date/Time: 09/22/2017 11:21 AM Performed by: Thorvald Orsino T, CRNA Pre-anesthesia Checklist: Patient identified, Emergency Drugs available, Suction available and Patient being monitored Patient Re-evaluated:Patient Re-evaluated prior to induction Oxygen Delivery Method: Circle system utilized Preoxygenation: Pre-oxygenation with 100% oxygen Induction Type: IV induction Ventilation: Mask ventilation without difficulty Laryngoscope Size: Mac and 3 Grade View: Grade I Tube type: Oral Tube size: 7.5 mm Number of attempts: 1 Airway Equipment and Method: Patient positioned with wedge pillow and Stylet Placement Confirmation: ETT inserted through vocal cords under direct vision,  positive ETCO2 and breath sounds checked- equal and bilateral Secured at: 21 cm Tube secured with: Tape Dental Injury: Teeth and Oropharynx as per pre-operative assessment

## 2017-09-23 ENCOUNTER — Telehealth: Payer: Self-pay | Admitting: *Deleted

## 2017-09-23 DIAGNOSIS — Z79899 Other long term (current) drug therapy: Secondary | ICD-10-CM | POA: Diagnosis not present

## 2017-09-23 DIAGNOSIS — Z888 Allergy status to other drugs, medicaments and biological substances status: Secondary | ICD-10-CM | POA: Diagnosis not present

## 2017-09-23 DIAGNOSIS — I739 Peripheral vascular disease, unspecified: Secondary | ICD-10-CM | POA: Diagnosis not present

## 2017-09-23 DIAGNOSIS — Z885 Allergy status to narcotic agent status: Secondary | ICD-10-CM | POA: Diagnosis not present

## 2017-09-23 DIAGNOSIS — M4802 Spinal stenosis, cervical region: Secondary | ICD-10-CM | POA: Diagnosis not present

## 2017-09-23 DIAGNOSIS — J45909 Unspecified asthma, uncomplicated: Secondary | ICD-10-CM | POA: Diagnosis not present

## 2017-09-23 DIAGNOSIS — I10 Essential (primary) hypertension: Secondary | ICD-10-CM | POA: Diagnosis not present

## 2017-09-23 DIAGNOSIS — Z7982 Long term (current) use of aspirin: Secondary | ICD-10-CM | POA: Diagnosis not present

## 2017-09-23 MED ORDER — OXYCODONE HCL 5 MG PO TABS: 5.0000 mg | ORAL_TABLET | ORAL | Status: DC | PRN

## 2017-09-23 MED ORDER — HYDROCODONE-ACETAMINOPHEN 5-325 MG PO TABS: 1.0000 | ORAL_TABLET | ORAL | 0 refills | Status: DC | PRN

## 2017-09-23 MED ORDER — METHOCARBAMOL 500 MG PO TABS: 500.0000 mg | ORAL_TABLET | Freq: Four times a day (QID) | ORAL | 0 refills | Status: DC | PRN

## 2017-09-23 MED FILL — Thrombin For Soln 5000 Unit: CUTANEOUS | Qty: 5000 | Status: AC

## 2017-09-23 NOTE — Progress Notes (Signed)
PT Cancellation Note  Patient Details Name: Paula Griffith MRN: [id number] DOB: 04-Oct-1945   Cancelled Treatment:    Reason Eval/Treat Not Completed: Patient declined, no reason specified. Pt refusing as she was eating breakfast and reported that she did not sleep well. Pt stated that she has no concerns with mobility and that she ambulated in hallway several times throughout the night. Plan is for pt to d/c home today.    [place] 09/23/2017, 8:49 AM

## 2017-09-23 NOTE — Discharge Summary (Signed)
Physician Discharge Summary  Patient ID: Paula Griffith MRN: [id number] DOB/AGE: 08/06/1945 72 y.o.  Admit date: 09/22/2017 Discharge date: 09/23/2017  Admission Diagnoses: Cervical stenosis C3-   Discharge Diagnoses: same   Discharged Condition: good  Hospital Course: The patient was admitted on 09/22/2017 and taken to the operating room where the patient underwent acdf C3-4. The patient tolerated the procedure well and was taken to the recovery room and then to the floor in stable condition. The hospital course was routine. There were no complications. The wound remained clean dry and intact. Pt had appropriate neck soreness. No complaints of new pain or new N/T/W. The patient remained afebrile with stable vital signs, and tolerated a regular diet. The patient continued to increase activities, and pain was well controlled with oral pain medications.   Consults: None  Significant Diagnostic Studies:  Results for orders placed or performed during the hospital encounter of 09/21/17  Surgical pcr screen  Result Value Ref Range   MRSA, PCR NEGATIVE NEGATIVE   Staphylococcus aureus POSITIVE (A) NEGATIVE  Basic metabolic panel  Result Value Ref Range   Sodium 138 135 - 145 mmol/L   Potassium 3.4 (L) 3.5 - 5.1 mmol/L   Chloride 105 101 - 111 mmol/L   CO2 24 22 - 32 mmol/L   Glucose, Bld 118 (H) 65 - 99 mg/dL   BUN 8 6 - 20 mg/dL   Creatinine, Ser 0.70 0.44 - 1.00 mg/dL   Calcium 9.1 8.9 - 10.3 mg/dL   GFR calc non Af Amer >60 >60 mL/min   GFR calc Af Amer >60 >60 mL/min   Anion gap 9 5 - 15  CBC WITH DIFFERENTIAL  Result Value Ref Range   WBC 5.8 4.0 - 10.5 K/uL   RBC 3.21 (L) 3.87 - 5.11 MIL/uL   Hemoglobin 10.0 (L) 12.0 - 15.0 g/dL   HCT 29.6 (L) 36.0 - 46.0 %   MCV 92.2 78.0 - 100.0 fL   MCH 31.2 26.0 - 34.0 pg   MCHC 33.8 30.0 - 36.0 g/dL   RDW 13.3 11.5 - 15.5 %   Platelets 370 150 - 400 K/uL   Neutrophils Relative % 64 %   Neutro Abs 3.7 1.7 - 7.7 K/uL   Lymphocytes  Relative 29 %   Lymphs Abs 1.7 0.7 - 4.0 K/uL   Monocytes Relative 6 %   Monocytes Absolute 0.3 0.1 - 1.0 K/uL   Eosinophils Relative 1 %   Eosinophils Absolute 0.1 0.0 - 0.7 K/uL   Basophils Relative 0 %   Basophils Absolute 0.0 0.0 - 0.1 K/uL  Protime-INR  Result Value Ref Range   Prothrombin Time 13.7 11.4 - 15.2 seconds   INR 1.06     Chest 2 View  Result Date: 09/21/2017 CLINICAL DATA:  Preop for cervical spine surgery EXAM: CHEST - 2 VIEW COMPARISON:  Chest x-ray of 04/08/2012 FINDINGS: No active infiltrate or effusion is seen. Mediastinal and hilar contours are unremarkable. The heart is borderline enlarged. There are degenerative changes in the lower thoracic spine. IMPRESSION: No active lung disease.  Borderline cardiomegaly. Electronically Signed   By: Ivar Drape M.D.   On: 09/21/2017 16:34   Dg Cervical Spine 1 View  Result Date: 09/22/2017 CLINICAL DATA:  ACDF EXAM: DG CERVICAL SPINE - 1 VIEW; DG C-ARM 61-120 MIN COMPARISON:  Radiography 09/06/2017 FINDINGS: Single lateral fluoroscopic image shows C3-4 ACDF with ventral plate. In the lateral projection hardware and graft are in good position. No evidence of  fracture. IMPRESSION: Fluoroscopy for C3-4 ACDF Electronically Signed   By: Monte Fantasia M.D.   On: 09/22/2017 13:13   Dg C-arm 1-60 Min  Result Date: 09/22/2017 CLINICAL DATA:  ACDF EXAM: DG CERVICAL SPINE - 1 VIEW; DG C-ARM 61-120 MIN COMPARISON:  Radiography 09/06/2017 FINDINGS: Single lateral fluoroscopic image shows C3-4 ACDF with ventral plate. In the lateral projection hardware and graft are in good position. No evidence of fracture. IMPRESSION: Fluoroscopy for C3-4 ACDF Electronically Signed   By: Monte Fantasia M.D.   On: 09/22/2017 13:13    Antibiotics:  Anti-infectives (From admission, onward)   Start     Dose/Rate Route Frequency Ordered Stop   09/22/17 1800  ceFAZolin (ANCEF) IVPB 2g/100 mL premix     2 g 200 mL/hr over 30 Minutes Intravenous Every 8  hours 09/22/17 1551 09/23/17 0230   09/22/17 1155  bacitracin 50,000 Units in sodium chloride irrigation 0.9 % 500 mL irrigation  Status:  Discontinued       As needed 09/22/17 1156 09/22/17 1318   09/22/17 0955  ceFAZolin (ANCEF) 2-4 GM/100ML-% IVPB    Note to Pharmacy:  Connye Burkitt   : cabinet override      09/22/17 0955 09/22/17 1125   09/22/17 0951  ceFAZolin (ANCEF) IVPB 2g/100 mL premix     2 g 200 mL/hr over 30 Minutes Intravenous On call to O.R. 09/22/17 0951 09/22/17 1125      Discharge Exam: Blood pressure (!) 144/71, pulse 82, temperature 99.5 F (37.5 C), temperature source Oral, resp. rate 18, SpO2 100 %. Neurologic: Grossly normal Ambulating and voiding well  Discharge Medications:   Allergies as of 09/23/2017      Reactions   Benazepril Cough   cough   Codeine Nausea Only   Tramadol Other (See Comments)   Dizzy, nauseated      Medication List    TAKE these medications   albuterol 108 (90 Base) MCG/ACT inhaler Commonly known as:  PROAIR HFA Inhale 2 puffs into the lungs every 4 (four) hours as needed. For shortness of breath   amLODipine 5 MG tablet Commonly known as:  NORVASC TAKE 1 TABLET BY MOUTH EVERY DAY   anagrelide 0.5 MG capsule Commonly known as:  AGRYLIN TAKE ONE CAPSULE TWICE A DAY MON/WEDS/FRI AND TAKE ONE CAPSULE EVERY DAY ALL OTHER DAYS   aspirin 325 MG EC tablet Take 325 mg by mouth daily after breakfast.   BIOTIN MAXIMUM STRENGTH 10 MG Tabs Generic drug:  Biotin Take 1 tablet by mouth daily.   budesonide-formoterol 80-4.5 MCG/ACT inhaler Commonly known as:  SYMBICORT Inhale 2 puffs into the lungs 2 (two) times daily.   Cholecalciferol 1000 units tablet Take 1,000 Units by mouth daily.   gabapentin 100 MG capsule Commonly known as:  NEURONTIN Take 1 capsule (100 mg total) by mouth 3 (three) times daily.   HYDROcodone-acetaminophen 5-325 MG tablet Commonly known as:  NORCO/VICODIN Take 1 tablet by mouth every 4 (four) hours  as needed for moderate pain.   ibuprofen 600 MG tablet Commonly known as:  ADVIL,MOTRIN TAKE 1 TABLET (600 MG TOTAL) BY MOUTH 2 (TWO) TIMES DAILY AS NEEDED.   KLOR-CON 10 10 MEQ tablet Generic drug:  potassium chloride TAKE 1 TABLET BY MOUTH ONCE DAILY   losartan-hydrochlorothiazide 100-25 MG tablet Commonly known as:  HYZAAR TAKE 1 TABLET BY MOUTH DAILY.   methocarbamol 500 MG tablet Commonly known as:  ROBAXIN Take 1 tablet (500 mg total) by mouth every 6 (six)  hours as needed for muscle spasms.   multivitamin capsule Take 2 capsules by mouth daily.   vitamin C 1000 MG tablet Take 1,000 mg by mouth daily.       Disposition: home   Final Dx: same  Discharge Instructions     Remove dressing in 72 hours   Complete by:  As directed    Call MD for:  difficulty breathing, headache or visual disturbances   Complete by:  As directed    Call MD for:  extreme fatigue   Complete by:  As directed    Call MD for:  hives   Complete by:  As directed    Call MD for:  persistant dizziness or light-headedness   Complete by:  As directed    Call MD for:  persistant nausea and vomiting   Complete by:  As directed    Call MD for:  redness, tenderness, or signs of infection (pain, swelling, redness, odor or green/yellow discharge around incision site)   Complete by:  As directed    Call MD for:  severe uncontrolled pain   Complete by:  As directed    Call MD for:  temperature >100.4   Complete by:  As directed    Diet - low sodium heart healthy   Complete by:  As directed    Driving Restrictions   Complete by:  As directed    No driving 2 weeks   Increase activity slowly   Complete by:  As directed    Lifting restrictions   Complete by:  As directed    Nothing heavier than 8 lbs         Signed: Ocie Cornfield Deirdre Gryder 09/23/2017, 7:27 AM

## 2017-09-23 NOTE — Discharge Instructions (Signed)
Wound Care You may remove outer bandage in 2-3 days. You may shower today. Do not put any creams, lotions, or ointments on incision. Leave steri-strips on neck.  They will fall off by themselves. Activity Walk each and every day, increasing distance each day. No lifting greater than 5 lbs.  Avoid excessive neck motion. No driving for 2 weeks; may ride as a passenger locally.  Diet Resume your normal diet.  Return to Work Will be discussed at you follow up appointment. Call Your Doctor If Any of These Occur Redness, drainage, or swelling at the wound.  Temperature greater than 101 degrees. Severe pain not relieved by pain medication. Increased difficulty swallowing.  Incision starts to come apart. Follow Up Appt Call today for appointment in 2 weeks ([phone]) or for problems.  If you have any hardware placed in your spine, you will need an x-ray before your appointment.   Anterior Cervical Diskectomy and Fusion, Care After Refer to this sheet in the next few weeks. These instructions provide you with information about caring for yourself after your procedure. Your health care provider may also give you more specific instructions. Your treatment has been planned according to current medical practices, but problems sometimes occur. Call your health care provider if you have any problems or questions after your procedure. What can I expect after the procedure? After the procedure, it is common to have:  Neck pain.  Discomfort when swallowing.  Slight hoarseness.  Follow these instructions at home: If You Have a Neck Brace:  Wear it as told by your health care provider. Remove it only as told by your health care provider.  Keep the brace clean and dry. Incision care   Follow instructions from your health care provider about how to take care of the cut made during surgery (incision). Make sure you: ? Wash your hands with soap and water before you change your bandage (dressing).  If soap and water are not available, use hand sanitizer. ? Change your dressing as told by your health care provider. ? Leave stitches (sutures), skin glue, or adhesive strips in place. These skin closures may need to stay in place for 2 weeks or longer. If adhesive strip edges start to loosen and curl up, you may trim the loose edges. Do not remove adhesive strips completely unless your health care provider tells you to do that.  Check your incision every day for signs of infection. Watch for: ? Redness, swelling, or pain. ? Fluid or blood. ? Warmth. ? Pus or a bad smell. Managing pain, stiffness, and swelling  Take over-the-counter and prescription medicines only as told by your health care provider.  If directed, apply ice to the injured area. ? Put ice in a plastic bag. ? Place a towel between your skin and the bag. ? Leave the ice on for 20 minutes, 2-3 times per day. Activity  Return to your normal activities as told by your health care provider. Ask your health care provider what activities are safe for you.  Do exercises as told by your health care provider.  Do not lift anything that is heavier than 10 lb (4.5 kg). General instructions  Do not drive or operate heavy machinery while taking prescription pain medicines.  Do not use any tobacco products, including cigarettes, chewing tobacco, or e-cigarettes. Tobacco can delay healing. If you need help quitting, ask your health care provider.  Keep all follow-up visits and physical therapy appointments as told by your health care provider.  This is important. Contact a health care provider if:  You have a fever.  You have redness, swelling, or pain around your incision.  You have fluid or blood coming from your incision.  You have pus or a bad smell coming from your incision.  You have pain that is not controlled by your pain medicine.  You have increasing hoarseness or trouble swallowing. Get help right away  if:  You have severe pain.  You have sudden numbness or weakness in your arms.  You have warmth, tenderness, or swelling in your calf.  You have chest pain.  You have difficulty breathing. This information is not intended to replace advice given to you by your health care provider. Make sure you discuss any questions you have with your health care provider. Document Released: 07/12/2015 Document Revised: 11/21/2015 Document Reviewed: 05/30/2015 Elsevier Interactive Patient Education  [employer].

## 2017-09-23 NOTE — Progress Notes (Signed)
OT Cancellation Note  Patient Details Name: Paula Griffith MRN: [id number] DOB: 1946-05-13   Cancelled Treatment:    Reason Eval/Treat Not Completed: OT screened, no needs identified, will sign off. OT entered room and Pt was fully dressed, has no concerns about ADL, reviewed handout in room about cervical precautions. Pt verbalized understanding. OT to sign off. Thank you for the opportunity to serve this patient.   [place] 09/23/2017, 9:44 AM  Hulda Humphrey OTR/L [phone]

## 2017-09-23 NOTE — Telephone Encounter (Signed)
Pt was on TCM report admitted 09/22/17 and  taken to the operating room where the patient underwent acdf C3-4. Pt D/C 09/23/17, will be following up w/surgeon in 2 weeks.Marland KitchenJohny Chess

## 2017-09-23 NOTE — Progress Notes (Signed)
Pt doing well. Pt given D/C instructions with Rx's, verbal understanding was provided. Pt's incision is clean and dry with no sign of infection. Pt's IV was removed prior to D/C. Pt D/C'd home via wheelchair @ 1010 per MD order. Pt is stable @ D/C and has no other needs at this time. Holli Humbles, RN

## 2017-09-24 ENCOUNTER — Encounter (HOSPITAL_COMMUNITY): Payer: Self-pay | Admitting: Neurological Surgery

## 2017-10-18 ENCOUNTER — Other Ambulatory Visit: Payer: Self-pay | Admitting: Internal Medicine

## 2017-11-15 ENCOUNTER — Telehealth: Payer: Self-pay | Admitting: Internal Medicine

## 2017-11-15 DIAGNOSIS — M4316 Spondylolisthesis, lumbar region: Secondary | ICD-10-CM | POA: Diagnosis not present

## 2017-11-15 DIAGNOSIS — M542 Cervicalgia: Secondary | ICD-10-CM | POA: Diagnosis not present

## 2017-11-15 NOTE — Telephone Encounter (Signed)
Pt is coming in to see Dr. Alain Marion on 5/21 to discuss the cardiology referral for surgery clearance Paula Griffith at Dr. Ronnald Ramp office is aware

## 2017-11-15 NOTE — Telephone Encounter (Signed)
Copied from [place] [phone]. Topic: Quick Communication - See Telephone Encounter >> Nov 15, 2017 10:56 AM Bea Graff, NT wrote: CRM for notification. See Telephone encounter for: 11/15/17. Lorriane Shire with Dr. Sherley Bounds office calling to see if someone can follow-up with the pt to see a cardiologist. Pt has not scheduled with them yet and referral was placed. CB#: [phone] ext [phone]

## 2017-11-16 ENCOUNTER — Encounter: Payer: Self-pay | Admitting: Internal Medicine

## 2017-11-16 ENCOUNTER — Ambulatory Visit (INDEPENDENT_AMBULATORY_CARE_PROVIDER_SITE_OTHER): Payer: Medicare Other | Admitting: Internal Medicine

## 2017-11-16 DIAGNOSIS — J4531 Mild persistent asthma with (acute) exacerbation: Secondary | ICD-10-CM

## 2017-11-16 DIAGNOSIS — M4322 Fusion of spine, cervical region: Secondary | ICD-10-CM

## 2017-11-16 DIAGNOSIS — Z01818 Encounter for other preprocedural examination: Secondary | ICD-10-CM | POA: Diagnosis not present

## 2017-11-16 NOTE — Assessment & Plan Note (Signed)
Use Symbicort perioperatively

## 2017-11-16 NOTE — Progress Notes (Signed)
Subjective:  Patient ID: Paula Griffith, female    DOB: 1945-10-08  Age: 72 y.o. MRN: [id number]  CC: No chief complaint on file.   HPI Paula Griffith presents for IM consult Reason: pre-op LS spine fusion Req by Dr Ronnald Ramp Hx: 72 yo with HTN, asthma, OA , Myeloproliferative neoplasm-essential thrombocythemia, JAK2 mutation negative - stable. S/p recent C spine surgery - healed well   Past Medical History:  Diagnosis Date  . Allergic rhinitis   . Asthma   . Cervicalgia   . Colon polyps   . Complication of anesthesia   . Diverticulosis of colon   . Glucose intolerance (impaired glucose tolerance)   . HTN (hypertension)   . LBP (low back pain)   . Leukemia ([place])   . Osteoarthritis   . PONV (postoperative nausea and vomiting)   . PVD (peripheral vascular disease) ([place]) 2011   Right Carotid Dr Scot Dock  . 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 Thrombocytopathy [facility]) 2011   Dr Ralene Ok   Past Surgical History:  Procedure Laterality Date  . ABDOMINAL HYSTERECTOMY  1989  . ANTERIOR CERVICAL DECOMP/DISCECTOMY FUSION N/A 09/22/2017   Procedure: ANTERIOR CERVICAL DECOMPRESSION AND FUSION CERVICAL THREE-FOUR.;  Surgeon: Eustace Moore, MD;  Location: [place];  Service: Neurosurgery;  Laterality: N/A;  anterior  . BREAST EXCISIONAL BIOPSY Left   . BREAST SURGERY     Left    reports that she has never smoked. She has never used smokeless tobacco. She reports that she does not drink alcohol or use drugs. family history includes Arthritis in her mother; Breast cancer in her maternal grandmother; Coronary artery disease in her unknown relative; Diabetes in her sister; Early death (age of onset: 39) in her father; Lung cancer in her unknown relative; Pancreatic cancer in her mother. Allergies  Allergen Reactions  . Benazepril Cough    cough  . Codeine Nausea Only  . Tramadol Other (See Comments)    Dizzy, nauseated    Outpatient Medications Prior to Visit  Medication Sig Dispense Refill    . albuterol (PROAIR HFA) 108 (90 Base) MCG/ACT inhaler Inhale 2 puffs into the lungs every 4 (four) hours as needed. For shortness of breath 1 Inhaler 3  . amLODipine (NORVASC) 5 MG tablet TAKE 1 TABLET BY MOUTH EVERY DAY 90 tablet 2  . anagrelide (AGRYLIN) 0.5 MG capsule TAKE ONE CAPSULE TWICE A DAY MON/WEDS/FRI AND TAKE ONE CAPSULE EVERY DAY ALL OTHER DAYS 90 capsule 3  . Ascorbic Acid (VITAMIN C) 1000 MG tablet Take 1,000 mg by mouth daily.    Marland Kitchen aspirin 325 MG EC tablet Take 325 mg by mouth daily after breakfast.      . Biotin (BIOTIN MAXIMUM STRENGTH) 10 MG TABS Take 1 tablet by mouth daily.     . budesonide-formoterol (SYMBICORT) 80-4.5 MCG/ACT inhaler Inhale 2 puffs into the lungs 2 (two) times daily. 10.2 g 1  . Cholecalciferol 1000 UNITS tablet Take 1,000 Units by mouth daily.      Marland Kitchen gabapentin (NEURONTIN) 100 MG capsule Take 1 capsule (100 mg total) by mouth 3 (three) times daily. 270 capsule 1  . HYDROcodone-acetaminophen (NORCO/VICODIN) 5-325 MG tablet Take 1 tablet by mouth every 4 (four) hours as needed for moderate pain. 30 tablet 0  . ibuprofen (ADVIL,MOTRIN) 600 MG tablet TAKE 1 TABLET (600 MG TOTAL) BY MOUTH 2 (TWO) TIMES DAILY AS NEEDED. 180 tablet 1  . KLOR-CON 10 10 MEQ tablet TAKE 1 TABLET BY MOUTH  ONCE DAILY 90 tablet 3  . losartan-hydrochlorothiazide (HYZAAR) 100-25 MG tablet TAKE 1 TABLET BY MOUTH DAILY. 90 tablet 3  . methocarbamol (ROBAXIN) 500 MG tablet Take 1 tablet (500 mg total) by mouth every 6 (six) hours as needed for muscle spasms. 30 tablet 0  . Multiple Vitamin (MULTIVITAMIN) capsule Take 2 capsules by mouth daily.      No facility-administered medications prior to visit.     ROS Review of Systems  Constitutional: Negative for activity change, appetite change, chills, fatigue and unexpected weight change.  HENT: Negative for congestion, mouth sores and sinus pressure.   Eyes: Negative for visual disturbance.  Respiratory: Negative for cough and chest  tightness.   Gastrointestinal: Negative for abdominal pain and nausea.  Genitourinary: Negative for difficulty urinating, frequency and vaginal pain.  Musculoskeletal: Positive for back pain, gait problem and neck stiffness. Negative for neck pain.  Skin: Negative for pallor and rash.  Neurological: Positive for weakness. Negative for dizziness, tremors, numbness and headaches.  Psychiatric/Behavioral: Negative for confusion, sleep disturbance and suicidal ideas.   LLE is weak  Objective:  BP (!) 146/78 (BP Location: Left Arm, Patient Position: Sitting, Cuff Size: Large)   Pulse 68   Temp 98.5 F (36.9 C) (Oral)   Ht 5\' 2"  (1.575 m)   Wt 145 lb (65.8 kg)   SpO2 99%   BMI 26.52 kg/m   BP Readings from Last 3 Encounters:  11/16/17 (!) 146/78  09/23/17 (!) 147/70  09/21/17 (!) 152/72    Wt Readings from Last 3 Encounters:  11/16/17 145 lb (65.8 kg)  09/21/17 147 lb 9.6 oz (67 kg)  08/26/17 148 lb (67.1 kg)    Physical Exam  Constitutional: She appears well-developed. No distress.  HENT:  Head: Normocephalic.  Right Ear: External ear normal.  Left Ear: External ear normal.  Nose: Nose normal.  Mouth/Throat: Oropharynx is clear and moist.  Eyes: Pupils are equal, round, and reactive to light. Conjunctivae are normal. Right eye exhibits no discharge. Left eye exhibits no discharge.  Neck: Normal range of motion. Neck supple. No JVD present. No tracheal deviation present. No thyromegaly present.  Cardiovascular: Normal rate, regular rhythm and normal heart sounds.  Pulmonary/Chest: No stridor. No respiratory distress. She has no wheezes.  Abdominal: Soft. Bowel sounds are normal. She exhibits no distension and no mass. There is no tenderness. There is no rebound and no guarding.  Musculoskeletal: She exhibits no edema or tenderness.  Lymphadenopathy:    She has no cervical adenopathy.  Neurological: She displays normal reflexes. No cranial nerve deficit. She exhibits normal  muscle tone. Coordination normal.  Skin: No rash noted. No erythema.  Psychiatric: She has a normal mood and affect. Her behavior is normal. Judgment and thought content normal.    Lab Results  Component Value Date   WBC 5.8 09/21/2017   HGB 10.0 (L) 09/21/2017   HCT 29.6 (L) 09/21/2017   PLT 370 09/21/2017   GLUCOSE 118 (H) 09/21/2017   CHOL 142 08/26/2017   TRIG 100.0 08/26/2017   HDL 48.30 08/26/2017   LDLCALC 73 08/26/2017   ALT <6 05/27/2017   AST 15 05/27/2017   NA 138 09/21/2017   K 3.4 (L) 09/21/2017   CL 105 09/21/2017   CREATININE 0.70 09/21/2017   BUN 8 09/21/2017   CO2 24 09/21/2017   TSH 1.07 08/26/2017   INR 1.06 09/21/2017   HGBA1C 6.8 (H) 08/26/2017    Chest 2 View  Result Date: 09/21/2017 CLINICAL  DATA:  Preop for cervical spine surgery EXAM: CHEST - 2 VIEW COMPARISON:  Chest x-ray of 04/08/2012 FINDINGS: No active infiltrate or effusion is seen. Mediastinal and hilar contours are unremarkable. The heart is borderline enlarged. There are degenerative changes in the lower thoracic spine. IMPRESSION: No active lung disease.  Borderline cardiomegaly. Electronically Signed   By: Ivar Drape M.D.   On: 09/21/2017 16:34   Dg Cervical Spine 1 View  Result Date: 09/22/2017 CLINICAL DATA:  ACDF EXAM: DG CERVICAL SPINE - 1 VIEW; DG C-ARM 61-120 MIN COMPARISON:  Radiography 09/06/2017 FINDINGS: Single lateral fluoroscopic image shows C3-4 ACDF with ventral plate. In the lateral projection hardware and graft are in good position. No evidence of fracture. IMPRESSION: Fluoroscopy for C3-4 ACDF Electronically Signed   By: Monte Fantasia M.D.   On: 09/22/2017 13:13   Dg C-arm 1-60 Min  Result Date: 09/22/2017 CLINICAL DATA:  ACDF EXAM: DG CERVICAL SPINE - 1 VIEW; DG C-ARM 61-120 MIN COMPARISON:  Radiography 09/06/2017 FINDINGS: Single lateral fluoroscopic image shows C3-4 ACDF with ventral plate. In the lateral projection hardware and graft are in good position. No evidence  of fracture. IMPRESSION: Fluoroscopy for C3-4 ACDF Electronically Signed   By: Monte Fantasia M.D.   On: 09/22/2017 13:13    Assessment & Plan:   There are no diagnoses linked to this encounter. I am having Paula Griffith maintain her aspirin, Cholecalciferol, multivitamin, Biotin, albuterol, budesonide-formoterol, vitamin C, losartan-hydrochlorothiazide, KLOR-CON 10, gabapentin, amLODipine, anagrelide, methocarbamol, HYDROcodone-acetaminophen, and ibuprofen.  No orders of the defined types were placed in this encounter.    Follow-up: No follow-ups on file.  [name], MD

## 2017-11-16 NOTE — Assessment & Plan Note (Signed)
Healing well.

## 2017-11-16 NOTE — Assessment & Plan Note (Addendum)
The pt is clear for her lumbar spinal fusion surgery medically, assuming her pre-op EKG, CXR, labs are acceptable. Use Symbicort perioperatively Her last EKG was done in March 2019 - normal. No chest pain/SOB. The pt needs to be cleared by Dr Burr Medico too... Thank you!

## 2017-11-24 ENCOUNTER — Inpatient Hospital Stay: Payer: Medicare Other | Attending: Hematology

## 2017-11-24 DIAGNOSIS — D471 Chronic myeloproliferative disease: Secondary | ICD-10-CM | POA: Diagnosis present

## 2017-11-24 LAB — COMPREHENSIVE METABOLIC PANEL
ALBUMIN: 4.2 g/dL (ref 3.5–5.0)
ALT: 8 U/L (ref 0–55)
AST: 18 U/L (ref 5–34)
Alkaline Phosphatase: 58 U/L (ref 40–150)
Anion gap: 13 — ABNORMAL HIGH (ref 3–11)
BUN: 13 mg/dL (ref 7–26)
CHLORIDE: 99 mmol/L (ref 98–109)
CO2: 23 mmol/L (ref 22–29)
Calcium: 10 mg/dL (ref 8.4–10.4)
Creatinine, Ser: 0.95 mg/dL (ref 0.60–1.10)
GFR calc Af Amer: >60 mL/min (ref >60)
GFR calc non Af Amer: 58 mL/min — ABNORMAL LOW (ref >60)
GLUCOSE: 131 mg/dL (ref 70–140)
POTASSIUM: 3.6 mmol/L (ref 3.5–5.1)
SODIUM: 135 mmol/L — AB (ref 136–145)
Total Bilirubin: 0.5 mg/dL (ref 0.2–1.2)
Total Protein: 7.4 g/dL (ref 6.4–8.3)

## 2017-11-24 LAB — CBC WITH DIFFERENTIAL/PLATELET
Basophils Absolute: 0 10*3/uL (ref 0.0–0.1)
Basophils Relative: 1 %
EOS PCT: 1 %
Eosinophils Absolute: 0 10*3/uL (ref 0.0–0.5)
HCT: 33.1 % — ABNORMAL LOW (ref 34.8–46.6)
Hemoglobin: 11.2 g/dL — ABNORMAL LOW (ref 11.6–15.9)
LYMPHS ABS: 1.4 10*3/uL (ref 0.9–3.3)
LYMPHS PCT: 32 %
MCH: 30.2 pg (ref 25.1–34.0)
MCHC: 33.8 g/dL (ref 31.5–36.0)
MCV: 89.2 fL (ref 79.5–101.0)
MONOS PCT: 8 %
Monocytes Absolute: 0.4 10*3/uL (ref 0.1–0.9)
Neutro Abs: 2.5 10*3/uL (ref 1.5–6.5)
Neutrophils Relative %: 58 %
PLATELETS: 337 10*3/uL (ref 145–400)
RBC: 3.71 MIL/uL (ref 3.70–5.45)
RDW: 12.3 % (ref 11.2–14.5)
WBC: 4.3 10*3/uL (ref 3.9–10.3)

## 2017-11-26 ENCOUNTER — Telehealth: Payer: Self-pay

## 2017-11-26 NOTE — Telephone Encounter (Signed)
Per Dr. Burr Medico spoke to patient explained her blood work is good, no concerns.

## 2017-11-26 NOTE — Telephone Encounter (Signed)
-----   Message from Truitt Merle, MD sent at 11/25/2017 11:04 PM EDT ----- Please let pt know the lab results, no concerns. Thanks  Truitt Merle  11/25/2017

## 2017-12-01 ENCOUNTER — Telehealth: Payer: Self-pay | Admitting: Internal Medicine

## 2017-12-01 NOTE — Telephone Encounter (Signed)
Do you know anything about this? 

## 2017-12-01 NOTE — Telephone Encounter (Signed)
Copied from [place] [id number]. Topic: Referral - Status >> Dec 01, 2017 10:39 AM Rutherford Nail, NT wrote: Reason for CRM: Lorriane Shire at Dr Ronnald Ramp office calling to check the status of the cardiology referral. States they have not heard anything and that the surgery is pending the referral and surgical clearance. CB#: [phone] ext [phone]

## 2017-12-08 NOTE — Telephone Encounter (Signed)
Surgery clearance faxed, no referral needed

## 2017-12-14 ENCOUNTER — Other Ambulatory Visit: Payer: Self-pay | Admitting: Neurological Surgery

## 2017-12-28 NOTE — Pre-Procedure Instructions (Signed)
SHANIQWA HORSMAN  12/28/2017      [id number] [name], [place] [address] Phone: [phone] Fax: [phone]    Your procedure is scheduled on July 11  Report to [place] at [place].M.  Call this number if you have problems the morning of surgery:  [phone]   Remember:  Do not eat or drink after midnight.      Take these medicines the morning of surgery with A SIP OF WATER  albuterol (PROAIR HFA) amLODipine (NORVASC) gabapentin (NEURONTIN)   7 days prior to surgery STOP taking any Aspirin(unless otherwise instructed by your surgeon), Aleve, Naproxen, Ibuprofen, Motrin, Advil, Goody's, BC's, all herbal medications, fish oil, and all vitamins  Follow your doctors instructions regarding your Aspirin.  If no instructions were given by your doctor, then you will need to call the prescribing office office to get instructions.       Do not wear jewelry, make-up or nail polish.  Do not wear lotions, powders, or perfumes, or deodorant.  Do not shave 48 hours prior to surgery.   Do not bring valuables to the hospital.  [facility] is not responsible for any belongings or valuables.  Contacts, dentures or bridgework may not be worn into surgery.  Leave your suitcase in the car.  After surgery it may be brought to your room.  For patients admitted to the hospital, discharge time will be determined by your treatment team.  Patients discharged the day of surgery will not be allowed to drive home.    Special instructions:  [place]- Preparing For Surgery  Before surgery, you can play an important role. Because skin is not sterile, your skin needs to be as free of germs as possible. You can reduce the number of germs on your skin by washing with CHG (chlorahexidine gluconate) Soap before surgery.  CHG is an antiseptic cleaner which kills germs and bonds with the skin to continue killing  germs even after washing.    Oral Hygiene is also important to reduce your risk of infection.  Remember - BRUSH YOUR TEETH THE MORNING OF SURGERY WITH YOUR REGULAR TOOTHPASTE  Please do not use if you have an allergy to CHG or antibacterial soaps. If your skin becomes reddened/irritated stop using the CHG.  Do not shave (including legs and underarms) for at least 48 hours prior to first CHG shower. It is OK to shave your face.  Please follow these instructions carefully.   1. Shower the NIGHT BEFORE SURGERY and the MORNING OF SURGERY with CHG.   2. If you chose to wash your hair, wash your hair first as usual with your normal shampoo.  3. After you shampoo, rinse your hair and body thoroughly to remove the shampoo.  4. Use CHG as you would any other liquid soap. You can apply CHG directly to the skin and wash gently with a scrungie or a clean washcloth.   5. Apply the CHG Soap to your body ONLY FROM THE NECK DOWN.  Do not use on open wounds or open sores. Avoid contact with your eyes, ears, mouth and genitals (private parts). Wash Face and genitals (private parts)  with your normal soap.  6. Wash thoroughly, paying special attention to the area where your surgery will be performed.  7. Thoroughly rinse your body with warm water from the neck down.  8. DO NOT shower/wash with your normal soap  after using and rinsing off the CHG Soap.  9. Pat yourself dry with a CLEAN TOWEL.  10. Wear CLEAN PAJAMAS to bed the night before surgery, wear comfortable clothes the morning of surgery  11. Place CLEAN SHEETS on your bed the night of your first shower and DO NOT SLEEP WITH PETS.    Day of Surgery:  Do not apply any deodorants/lotions.  Please wear clean clothes to the hospital/surgery center.   Remember to brush your teeth WITH YOUR REGULAR TOOTHPASTE.    Please read over the following fact sheets that you were given.

## 2017-12-29 ENCOUNTER — Encounter (HOSPITAL_COMMUNITY)
Admission: RE | Admit: 2017-12-29 | Discharge: 2017-12-29 | Disposition: A | Discharge status: Home / Self Care | Payer: Medicare Other | Source: Ambulatory Visit | Attending: Neurological Surgery | Admitting: Neurological Surgery

## 2017-12-29 ENCOUNTER — Encounter (HOSPITAL_COMMUNITY): Payer: Self-pay

## 2017-12-29 ENCOUNTER — Other Ambulatory Visit: Payer: Self-pay

## 2017-12-29 DIAGNOSIS — Z01812 Encounter for preprocedural laboratory examination: Secondary | ICD-10-CM | POA: Insufficient documentation

## 2017-12-29 HISTORY — DX: Spondylolisthesis, lumbar region: M43.16

## 2017-12-29 LAB — BASIC METABOLIC PANEL
Anion gap: 12 (ref 5–15)
BUN: 11 mg/dL (ref 8–23)
CHLORIDE: 99 mmol/L (ref 98–111)
CO2: 25 mmol/L (ref 22–32)
Calcium: 9.7 mg/dL (ref 8.9–10.3)
Creatinine, Ser: 0.75 mg/dL (ref 0.44–1.00)
GFR calc Af Amer: >60 mL/min (ref >60)
GFR calc non Af Amer: >60 mL/min (ref >60)
Glucose, Bld: 173 mg/dL — ABNORMAL HIGH (ref 70–99)
Potassium: 3.2 mmol/L — ABNORMAL LOW (ref 3.5–5.1)
Sodium: 136 mmol/L (ref 135–145)

## 2017-12-29 LAB — PROTIME-INR
INR: 0.97
Prothrombin Time: 12.8 seconds (ref 11.4–15.2)

## 2017-12-29 LAB — CBC WITH DIFFERENTIAL/PLATELET
ABS IMMATURE GRANULOCYTES: 0 10*3/uL (ref 0.0–0.1)
BASOS PCT: 1 %
Basophils Absolute: 0 10*3/uL (ref 0.0–0.1)
EOS ABS: 0 10*3/uL (ref 0.0–0.7)
Eosinophils Relative: 0 %
HEMATOCRIT: 31.4 % — AB (ref 36.0–46.0)
Hemoglobin: 10.5 g/dL — ABNORMAL LOW (ref 12.0–15.0)
Immature Granulocytes: 0 %
LYMPHS ABS: 1.4 10*3/uL (ref 0.7–4.0)
Lymphocytes Relative: 26 %
MCH: 29.8 pg (ref 26.0–34.0)
MCHC: 33.4 g/dL (ref 30.0–36.0)
MCV: 89.2 fL (ref 78.0–100.0)
MONO ABS: 0.5 10*3/uL (ref 0.1–1.0)
MONOS PCT: 8 %
NEUTROS ABS: 3.5 10*3/uL (ref 1.7–7.7)
Neutrophils Relative %: 65 %
PLATELETS: 316 10*3/uL (ref 150–400)
RBC: 3.52 MIL/uL — ABNORMAL LOW (ref 3.87–5.11)
RDW: 12.5 % (ref 11.5–15.5)
WBC: 5.5 10*3/uL (ref 4.0–10.5)

## 2017-12-29 LAB — TYPE AND SCREEN
ABO/RH(D): O POS
Antibody Screen: NEGATIVE

## 2017-12-29 LAB — SURGICAL PCR SCREEN
MRSA, PCR: NEGATIVE
Staphylococcus aureus: POSITIVE — AB

## 2017-12-29 LAB — ABO/RH: ABO/RH(D): O POS

## 2017-12-29 NOTE — Progress Notes (Signed)
Pt denies SOB, chest pain, and being under the care of a cardiologist. Pt denies having a stress test and cardiac cath. Pt denies recent labs. Pt stated that she was instructed by surgeon to stop taking Aspirin 5 days prior to surgery. Anesthesia to review clearance note.

## 2017-12-29 NOTE — Pre-Procedure Instructions (Signed)
   DAE ANTONUCCI  12/29/2017     [id number] [name], [place] [address] Phone: [phone] Fax: [phone]   Your procedure is scheduled on Thursday, January 06, 2018  Report to [facility] Admitting at 9:15 A.M.  Call this number if you have problems the morning of surgery:  [phone]   Remember: Follow your surgeon's instructions regarding Aspirin  Do not eat or drink after midnight.  Take these medicines the morning of surgery with A SIP OF WATER : amLODipine (NORVASC), gabapentin (NEURONTIN) , if needed: albuterol (PROAIR HFA)  Inhaler if needed: for shortness of breath ( bring in with you on day of surgery). Stop taking vitamins, fish oil, Biotin and herbal medications. Do not take any NSAIDs ie: Ibuprofen, Advil, Naproxen  (Aleve), Motrin, BC and Goody Powder; stop Thursday December 30, 2017   Do not wear jewelry, make-up or nail polish.  Do not wear lotions, powders, or perfumes, or deodorant.  Do not shave 48 hours prior to surgery.    Do not bring valuables to the hospital.  [facility] is not responsible for any belongings or valuables.  Contacts, dentures or bridgework may not be worn into surgery.  Leave your suitcase in the car.  After surgery it may be brought to your room Patients discharged the day of surgery will not be allowed to drive home.  Special instructions: Shower the night before surgery and the morning of surgery with CHG. Please read over the following fact sheets that you were given. Pain Booklet, Coughing and Deep Breathing, MRSA Information and Surgical Site Infection Prevention

## 2017-12-31 NOTE — Progress Notes (Signed)
Anesthesia Chart Review:  Case:  [id number] Date/Time:  01/06/18 1100   Procedures:      PLIF L3-L4 - L4-L5, (N/A Back)     laminectomy L5-S1 - left, L2-L3 - bilateral (Left Back)   Anesthesia type:  General   Pre-op diagnosis:  Spondylolisthesis   Location:  [facility] OR ROOM 76 / [place] [facility]   Surgeon:  Eustace Moore, MD      DISCUSSION: Pt is a 72 yo female scheduled for above procedure. Pertinent medical history includes essential myeloproliferative neoplasm - thrombocythemia with thrombocytosis. Right retinal artery occlusion and subsequent retinal infarct on 05/22/2009 (vision has returned to near normal), ICA stenosis (1-39%, stable per last [place]), PVD, HTN, Asthma, recent ACDF 09/22/2017 has healed well per notes.  She has clearance from PCP and heme/onc. Anticipate she can proceed with surgery as scheduled barring any acte status change.  VS: BP (!) 154/63   Pulse 92   Temp 37.2 C   Resp 20   Wt 142 lb 4.8 oz (64.5 kg)   SpO2 100%   BMI 26.03 kg/m   PROVIDERS: [name], [name], MD   Deitra Mayo, MD is vascular surgeon following for ICA stenosis, pt last in office to see Vinnie Level Nickel FNP on 04/07/2017.  Truitt Merle, MD is Heme/Onc last seen 08/25/2017 and gave surgical clearance 11/25/2017 stating "Please let pt know the lab results, no concerns. Thanks Truitt Merle  11/25/2017".   [name], [name], MD is PCP last seen 11/16/2017 at which time he gave clearance stating "The pt is clear for her lumbar spinal fusion surgery medically, assuming her pre-op EKG, CXR, labs are acceptable. Use Symbicort perioperatively. Her last EKG was done in March 2019 - normal. No chest pain/SOB. The pt needs to be cleared by Dr Burr Medico too.Marland KitchenMarland KitchenThank you!"    LABS: Labs reviewed: Acceptable for surgery. Elevated blood glucose on nonfasting labs, no history of DM. (all labs ordered are listed, but only abnormal results are displayed)  Labs Reviewed  SURGICAL PCR SCREEN - Abnormal; Notable for the  following components:      Result Value   Staphylococcus aureus POSITIVE (*)    All other components within normal limits  BASIC METABOLIC PANEL - Abnormal; Notable for the following components:   Potassium 3.2 (*)    Glucose, Bld 173 (*)    All other components within normal limits  CBC WITH DIFFERENTIAL/PLATELET - Abnormal; Notable for the following components:   RBC 3.52 (*)    Hemoglobin 10.5 (*)    HCT 31.4 (*)    All other components within normal limits  PROTIME-INR  TYPE AND SCREEN  ABO/RH     IMAGES: 09/21/2017 CHEST - 2 VIEW  COMPARISON:  Chest x-ray of 04/08/2012  FINDINGS: No active infiltrate or effusion is seen. Mediastinal and hilar contours are unremarkable. The heart is borderline enlarged. There are degenerative changes in the lower thoracic spine.  IMPRESSION: No active lung disease.  Borderline cardiomegaly.   EKG: 09/21/2017: NSR  CV: Carotid Duplex (04/07/17): 1-39% bilateral ICA stenoses. Bilateral vertebral artery flow is antegrade.  Bilateral subclavian artery waveforms are normal.  No significant change compared to the exam on 04-03-15.   ECHO 2011: EF 55-60%  Past Medical History:  Diagnosis Date  . Allergic rhinitis   . Asthma   . Cervicalgia   . Colon polyps   . Complication of anesthesia   . Diverticulosis of colon   . Glucose intolerance (impaired glucose tolerance)   . HTN (  hypertension)   . LBP (low back pain)   . Leukemia ([place])   . Osteoarthritis   . PONV (postoperative nausea and vomiting)   . PVD (peripheral vascular disease) ([place]) 2011   Right Carotid Dr Scot Dock  . Retinal infarct 3614   embolic  . Spondylolisthesis of lumbar region   . Thrombocytopathy [facility]) 2011   Dr Ralene Ok    Past Surgical History:  Procedure Laterality Date  . ABDOMINAL HYSTERECTOMY  1989  . ANTERIOR CERVICAL DECOMP/DISCECTOMY FUSION N/A 09/22/2017   Procedure: ANTERIOR CERVICAL DECOMPRESSION AND FUSION CERVICAL THREE-FOUR.;  Surgeon:  Eustace Moore, MD;  Location: [place];  Service: Neurosurgery;  Laterality: N/A;  anterior  . BREAST EXCISIONAL BIOPSY Left   . BREAST SURGERY     Left    MEDICATIONS: . albuterol (PROAIR HFA) 108 (90 Base) MCG/ACT inhaler  . amLODipine (NORVASC) 5 MG tablet  . anagrelide (AGRYLIN) 0.5 MG capsule  . Ascorbic Acid (VITAMIN C) 1000 MG tablet  . aspirin 325 MG EC tablet  . Biotin 10000 MCG TABS  . Cholecalciferol (VITAMIN D3) 2000 units TABS  . gabapentin (NEURONTIN) 100 MG capsule  . HYDROcodone-acetaminophen (NORCO/VICODIN) 5-325 MG tablet  . ibuprofen (ADVIL,MOTRIN) 600 MG tablet  . KLOR-CON 10 10 MEQ tablet  . losartan-hydrochlorothiazide (HYZAAR) 100-25 MG tablet  . methocarbamol (ROBAXIN) 500 MG tablet  . Multiple Vitamins-Minerals (ADULT GUMMY PO)  . trolamine salicylate (ASPERCREME) 10 % cream   No current facility-administered medications for this encounter.     Wynonia Musty [facility] Short Stay [facility]/Anesthesiology Phone [phone] 01/03/2018 1:48 PM

## 2018-01-03 NOTE — Telephone Encounter (Signed)
vanessa from Dr Ronnald Ramp office called, she needs a letter stating that no referral to cardio is needed.  Please fax letter to [phone] attn : Lorriane Shire

## 2018-01-04 NOTE — Telephone Encounter (Signed)
Note faxed.

## 2018-01-06 ENCOUNTER — Inpatient Hospital Stay (HOSPITAL_COMMUNITY): Payer: Medicare Other | Admitting: Physician Assistant

## 2018-01-06 ENCOUNTER — Inpatient Hospital Stay (HOSPITAL_COMMUNITY): Payer: Medicare Other

## 2018-01-06 ENCOUNTER — Other Ambulatory Visit: Payer: Self-pay

## 2018-01-06 ENCOUNTER — Inpatient Hospital Stay (HOSPITAL_COMMUNITY): Payer: Medicare Other | Admitting: Anesthesiology

## 2018-01-06 ENCOUNTER — Encounter (HOSPITAL_COMMUNITY)
Admission: RE | Disposition: A | Discharge status: Home / Self Care | Payer: Self-pay | Source: Home / Self Care | Attending: Neurological Surgery

## 2018-01-06 ENCOUNTER — Inpatient Hospital Stay (HOSPITAL_COMMUNITY)
Admission: RE | Admit: 2018-01-06 | Discharge: 2018-01-08 | DRG: 455 | Disposition: A | Discharge status: Home / Self Care | Payer: Medicare Other | Attending: Neurological Surgery | Admitting: Neurological Surgery

## 2018-01-06 ENCOUNTER — Encounter (HOSPITAL_COMMUNITY): Payer: Self-pay | Admitting: Surgery

## 2018-01-06 DIAGNOSIS — M4316 Spondylolisthesis, lumbar region: Secondary | ICD-10-CM | POA: Diagnosis not present

## 2018-01-06 DIAGNOSIS — Z803 Family history of malignant neoplasm of breast: Secondary | ICD-10-CM

## 2018-01-06 DIAGNOSIS — Z791 Long term (current) use of non-steroidal anti-inflammatories (NSAID): Secondary | ICD-10-CM | POA: Diagnosis not present

## 2018-01-06 DIAGNOSIS — Z888 Allergy status to other drugs, medicaments and biological substances status: Secondary | ICD-10-CM

## 2018-01-06 DIAGNOSIS — Z419 Encounter for procedure for purposes other than remedying health state, unspecified: Secondary | ICD-10-CM

## 2018-01-06 DIAGNOSIS — Z885 Allergy status to narcotic agent status: Secondary | ICD-10-CM | POA: Diagnosis not present

## 2018-01-06 DIAGNOSIS — Z800 Family history of malignant neoplasm of digestive organs: Secondary | ICD-10-CM | POA: Diagnosis not present

## 2018-01-06 DIAGNOSIS — Z7982 Long term (current) use of aspirin: Secondary | ICD-10-CM | POA: Diagnosis not present

## 2018-01-06 DIAGNOSIS — R7302 Impaired glucose tolerance (oral): Secondary | ICD-10-CM | POA: Diagnosis present

## 2018-01-06 DIAGNOSIS — M5416 Radiculopathy, lumbar region: Secondary | ICD-10-CM | POA: Diagnosis present

## 2018-01-06 DIAGNOSIS — M48062 Spinal stenosis, lumbar region with neurogenic claudication: Principal | ICD-10-CM | POA: Diagnosis present

## 2018-01-06 DIAGNOSIS — M4310 Spondylolisthesis, site unspecified: Secondary | ICD-10-CM

## 2018-01-06 DIAGNOSIS — M48061 Spinal stenosis, lumbar region without neurogenic claudication: Secondary | ICD-10-CM | POA: Diagnosis not present

## 2018-01-06 DIAGNOSIS — I10 Essential (primary) hypertension: Secondary | ICD-10-CM | POA: Diagnosis present

## 2018-01-06 DIAGNOSIS — Z90710 Acquired absence of both cervix and uterus: Secondary | ICD-10-CM | POA: Diagnosis not present

## 2018-01-06 DIAGNOSIS — I739 Peripheral vascular disease, unspecified: Secondary | ICD-10-CM | POA: Diagnosis present

## 2018-01-06 DIAGNOSIS — M4807 Spinal stenosis, lumbosacral region: Secondary | ICD-10-CM | POA: Diagnosis not present

## 2018-01-06 DIAGNOSIS — Z8261 Family history of arthritis: Secondary | ICD-10-CM | POA: Diagnosis not present

## 2018-01-06 DIAGNOSIS — J45909 Unspecified asthma, uncomplicated: Secondary | ICD-10-CM | POA: Diagnosis not present

## 2018-01-06 DIAGNOSIS — Z856 Personal history of leukemia: Secondary | ICD-10-CM

## 2018-01-06 DIAGNOSIS — Z981 Arthrodesis status: Secondary | ICD-10-CM | POA: Diagnosis not present

## 2018-01-06 DIAGNOSIS — R0789 Other chest pain: Secondary | ICD-10-CM | POA: Diagnosis not present

## 2018-01-06 DIAGNOSIS — M4326 Fusion of spine, lumbar region: Secondary | ICD-10-CM | POA: Diagnosis not present

## 2018-01-06 HISTORY — PX: LUMBAR LAMINECTOMY/DECOMPRESSION MICRODISCECTOMY: SHX5026

## 2018-01-06 SURGERY — POSTERIOR LUMBAR FUSION 2 LEVEL
Anesthesia: General | Site: Back

## 2018-01-06 MED ORDER — ONDANSETRON HCL 4 MG/2ML IJ SOLN
INTRAMUSCULAR | Status: DC | PRN
  Administered 2018-01-06: 4 mg via INTRAVENOUS

## 2018-01-06 MED ORDER — FENTANYL CITRATE (PF) 250 MCG/5ML IJ SOLN
INTRAMUSCULAR | Status: AC
  Filled 2018-01-06: qty 5

## 2018-01-06 MED ORDER — PROPOFOL 10 MG/ML IV BOLUS
INTRAVENOUS | Status: AC
  Filled 2018-01-06: qty 20

## 2018-01-06 MED ORDER — HYDROCHLOROTHIAZIDE 25 MG PO TABS
25.0000 mg | ORAL_TABLET | Freq: Every day | ORAL | Status: DC
  Administered 2018-01-06 – 2018-01-08 (×2): 25 mg via ORAL
  Filled 2018-01-06 (×3): qty 1

## 2018-01-06 MED ORDER — OXYCODONE HCL 5 MG PO TABS
ORAL_TABLET | ORAL | Status: AC
  Filled 2018-01-06: qty 1

## 2018-01-06 MED ORDER — HEPARIN SODIUM (PORCINE) 10000 UNIT/ML IJ SOLN
INTRAMUSCULAR | Status: DC | PRN
  Administered 2018-01-06: 5 mL via SUBCUTANEOUS

## 2018-01-06 MED ORDER — BUPIVACAINE HCL (PF) 0.25 % IJ SOLN
INTRAMUSCULAR | Status: DC | PRN
  Administered 2018-01-06: 7 mL

## 2018-01-06 MED ORDER — CHLORHEXIDINE GLUCONATE CLOTH 2 % EX PADS: 6.0000 | MEDICATED_PAD | Freq: Once | CUTANEOUS | Status: DC

## 2018-01-06 MED ORDER — ALBUTEROL SULFATE (2.5 MG/3ML) 0.083% IN NEBU: 2.5000 mg | INHALATION_SOLUTION | RESPIRATORY_TRACT | Status: DC | PRN

## 2018-01-06 MED ORDER — SUGAMMADEX SODIUM 200 MG/2ML IV SOLN
INTRAVENOUS | Status: DC | PRN
  Administered 2018-01-06: 200 mg via INTRAVENOUS

## 2018-01-06 MED ORDER — MENTHOL 3 MG MT LOZG: 1.0000 | LOZENGE | OROMUCOSAL | Status: DC | PRN

## 2018-01-06 MED ORDER — LACTATED RINGERS IV SOLN
INTRAVENOUS | Status: DC | PRN
  Administered 2018-01-06 (×2): via INTRAVENOUS

## 2018-01-06 MED ORDER — CEFAZOLIN SODIUM-DEXTROSE 2-4 GM/100ML-% IV SOLN
INTRAVENOUS | Status: AC
  Filled 2018-01-06: qty 100

## 2018-01-06 MED ORDER — SODIUM CHLORIDE 0.9 % IV SOLN: 250.0000 mL | INTRAVENOUS | Status: DC

## 2018-01-06 MED ORDER — ROCURONIUM BROMIDE 10 MG/ML (PF) SYRINGE
PREFILLED_SYRINGE | INTRAVENOUS | Status: AC
  Filled 2018-01-06: qty 10

## 2018-01-06 MED ORDER — OXYCODONE HCL 5 MG/5ML PO SOLN: 5.0000 mg | Freq: Once | ORAL | Status: AC | PRN

## 2018-01-06 MED ORDER — POTASSIUM CHLORIDE CRYS ER 10 MEQ PO TBCR
10.0000 meq | EXTENDED_RELEASE_TABLET | Freq: Every day | ORAL | Status: DC
  Administered 2018-01-06 – 2018-01-08 (×3): 10 meq via ORAL
  Filled 2018-01-06 (×6): qty 1

## 2018-01-06 MED ORDER — SODIUM CHLORIDE 0.9 % IV SOLN
INTRAVENOUS | Status: DC | PRN
  Administered 2018-01-06: 07:00:00

## 2018-01-06 MED ORDER — THROMBIN 20000 UNITS EX SOLR
CUTANEOUS | Status: DC | PRN
  Administered 2018-01-06: 07:00:00 via TOPICAL

## 2018-01-06 MED ORDER — METHOCARBAMOL 500 MG PO TABS
500.0000 mg | ORAL_TABLET | Freq: Four times a day (QID) | ORAL | Status: DC | PRN
  Administered 2018-01-06: 500 mg via ORAL

## 2018-01-06 MED ORDER — SODIUM CHLORIDE 0.9% FLUSH
3.0000 mL | INTRAVENOUS | Status: DC | PRN
Start: 2018-01-06 — End: 2018-01-08

## 2018-01-06 MED ORDER — ASPIRIN EC 325 MG PO TBEC
325.0000 mg | DELAYED_RELEASE_TABLET | Freq: Every day | ORAL | Status: DC
  Administered 2018-01-07 – 2018-01-08 (×2): 325 mg via ORAL
  Filled 2018-01-06 (×3): qty 1

## 2018-01-06 MED ORDER — ALBUMIN HUMAN 5 % IV SOLN
INTRAVENOUS | Status: DC | PRN
  Administered 2018-01-06: 12:00:00 via INTRAVENOUS

## 2018-01-06 MED ORDER — DEXAMETHASONE SODIUM PHOSPHATE 10 MG/ML IJ SOLN
10.0000 mg | INTRAMUSCULAR | Status: AC
  Administered 2018-01-06: 10 mg via INTRAVENOUS

## 2018-01-06 MED ORDER — FENTANYL CITRATE (PF) 100 MCG/2ML IJ SOLN
INTRAMUSCULAR | Status: AC
  Filled 2018-01-06: qty 2

## 2018-01-06 MED ORDER — LOSARTAN POTASSIUM 50 MG PO TABS
100.0000 mg | ORAL_TABLET | Freq: Every day | ORAL | Status: DC
  Administered 2018-01-06 – 2018-01-08 (×2): 100 mg via ORAL
  Filled 2018-01-06 (×3): qty 2

## 2018-01-06 MED ORDER — AMLODIPINE BESYLATE 5 MG PO TABS
5.0000 mg | ORAL_TABLET | Freq: Every day | ORAL | Status: DC
  Administered 2018-01-06 – 2018-01-08 (×2): 5 mg via ORAL
  Filled 2018-01-06 (×3): qty 1

## 2018-01-06 MED ORDER — SUGAMMADEX SODIUM 200 MG/2ML IV SOLN
INTRAVENOUS | Status: AC
  Filled 2018-01-06: qty 2

## 2018-01-06 MED ORDER — ROCURONIUM BROMIDE 100 MG/10ML IV SOLN
INTRAVENOUS | Status: DC | PRN
  Administered 2018-01-06 (×2): 20 mg via INTRAVENOUS
  Administered 2018-01-06: 50 mg via INTRAVENOUS

## 2018-01-06 MED ORDER — PHENOL 1.4 % MT LIQD: 1.0000 | OROMUCOSAL | Status: DC | PRN

## 2018-01-06 MED ORDER — VANCOMYCIN HCL 1000 MG IV SOLR
INTRAVENOUS | Status: AC
  Filled 2018-01-06: qty 1000

## 2018-01-06 MED ORDER — PHENYLEPHRINE HCL 10 MG/ML IJ SOLN
INTRAVENOUS | Status: DC | PRN
  Administered 2018-01-06: 20 ug/min via INTRAVENOUS

## 2018-01-06 MED ORDER — ONDANSETRON HCL 4 MG/2ML IJ SOLN
4.0000 mg | Freq: Four times a day (QID) | INTRAMUSCULAR | Status: DC | PRN
  Administered 2018-01-06: 4 mg via INTRAVENOUS
  Filled 2018-01-06: qty 2

## 2018-01-06 MED ORDER — ONDANSETRON HCL 4 MG PO TABS
4.0000 mg | ORAL_TABLET | Freq: Four times a day (QID) | ORAL | Status: DC | PRN
  Administered 2018-01-07: 4 mg via ORAL
  Filled 2018-01-06: qty 1

## 2018-01-06 MED ORDER — DEXAMETHASONE SODIUM PHOSPHATE 10 MG/ML IJ SOLN
INTRAMUSCULAR | Status: AC
  Filled 2018-01-06: qty 1

## 2018-01-06 MED ORDER — POTASSIUM CHLORIDE IN NACL 20-0.9 MEQ/L-% IV SOLN
INTRAVENOUS | Status: DC
  Administered 2018-01-06: 16:00:00 via INTRAVENOUS
  Filled 2018-01-06: qty 1000

## 2018-01-06 MED ORDER — LOSARTAN POTASSIUM-HCTZ 100-25 MG PO TABS: 1.0000 | ORAL_TABLET | Freq: Every day | ORAL | Status: DC

## 2018-01-06 MED ORDER — CELECOXIB 200 MG PO CAPS
200.0000 mg | ORAL_CAPSULE | Freq: Two times a day (BID) | ORAL | Status: DC
  Administered 2018-01-06 – 2018-01-08 (×4): 200 mg via ORAL
  Filled 2018-01-06 (×4): qty 1

## 2018-01-06 MED ORDER — FENTANYL CITRATE (PF) 100 MCG/2ML IJ SOLN
INTRAMUSCULAR | Status: DC | PRN
  Administered 2018-01-06 (×2): 50 ug via INTRAVENOUS
  Administered 2018-01-06: 100 ug via INTRAVENOUS
  Administered 2018-01-06: 50 ug via INTRAVENOUS

## 2018-01-06 MED ORDER — ACETAMINOPHEN 325 MG PO TABS: 650.0000 mg | ORAL_TABLET | ORAL | Status: DC | PRN

## 2018-01-06 MED ORDER — FENTANYL CITRATE (PF) 100 MCG/2ML IJ SOLN
25.0000 ug | INTRAMUSCULAR | Status: DC | PRN
  Administered 2018-01-06: 25 ug via INTRAVENOUS
  Administered 2018-01-06 (×2): 50 ug via INTRAVENOUS
  Administered 2018-01-06: 25 ug via INTRAVENOUS

## 2018-01-06 MED ORDER — SODIUM CHLORIDE 0.9% FLUSH
3.0000 mL | Freq: Two times a day (BID) | INTRAVENOUS | Status: DC
  Administered 2018-01-06 – 2018-01-08 (×4): 3 mL via INTRAVENOUS

## 2018-01-06 MED ORDER — HEMOSTATIC AGENTS (NO CHARGE) OPTIME
TOPICAL | Status: DC | PRN
  Administered 2018-01-06: 1 via TOPICAL

## 2018-01-06 MED ORDER — OXYCODONE HCL 5 MG PO TABS
5.0000 mg | ORAL_TABLET | Freq: Once | ORAL | Status: AC | PRN
  Administered 2018-01-06: 5 mg via ORAL

## 2018-01-06 MED ORDER — OXYCODONE HCL 5 MG PO TABS
5.0000 mg | ORAL_TABLET | ORAL | Status: DC | PRN
  Administered 2018-01-07 – 2018-01-08 (×4): 5 mg via ORAL
  Filled 2018-01-06 (×4): qty 1

## 2018-01-06 MED ORDER — LIDOCAINE HCL (CARDIAC) PF 100 MG/5ML IV SOSY
PREFILLED_SYRINGE | INTRAVENOUS | Status: DC | PRN
  Administered 2018-01-06: 60 mg via INTRAVENOUS

## 2018-01-06 MED ORDER — 0.9 % SODIUM CHLORIDE (POUR BTL) OPTIME
TOPICAL | Status: DC | PRN
  Administered 2018-01-06: 1000 mL

## 2018-01-06 MED ORDER — METHOCARBAMOL 1000 MG/10ML IJ SOLN
500.0000 mg | Freq: Four times a day (QID) | INTRAVENOUS | Status: DC | PRN
  Filled 2018-01-06: qty 5

## 2018-01-06 MED ORDER — SENNA 8.6 MG PO TABS
1.0000 | ORAL_TABLET | Freq: Two times a day (BID) | ORAL | Status: DC
  Administered 2018-01-06 – 2018-01-08 (×4): 8.6 mg via ORAL
  Filled 2018-01-06 (×5): qty 1

## 2018-01-06 MED ORDER — CEFAZOLIN SODIUM-DEXTROSE 2-4 GM/100ML-% IV SOLN
2.0000 g | INTRAVENOUS | Status: AC
  Administered 2018-01-06: 2 g via INTRAVENOUS

## 2018-01-06 MED ORDER — GABAPENTIN 100 MG PO CAPS
100.0000 mg | ORAL_CAPSULE | Freq: Three times a day (TID) | ORAL | Status: DC
  Administered 2018-01-06 – 2018-01-08 (×6): 100 mg via ORAL
  Filled 2018-01-06 (×6): qty 1

## 2018-01-06 MED ORDER — PHENYLEPHRINE 40 MCG/ML (10ML) SYRINGE FOR IV PUSH (FOR BLOOD PRESSURE SUPPORT)
PREFILLED_SYRINGE | INTRAVENOUS | Status: AC
  Filled 2018-01-06: qty 10

## 2018-01-06 MED ORDER — HYDROMORPHONE HCL 1 MG/ML IJ SOLN
0.5000 mg | INTRAMUSCULAR | Status: DC | PRN
  Administered 2018-01-06: 0.5 mg via INTRAVENOUS
  Filled 2018-01-06: qty 1

## 2018-01-06 MED ORDER — BUPIVACAINE HCL (PF) 0.25 % IJ SOLN
INTRAMUSCULAR | Status: AC
  Filled 2018-01-06: qty 30

## 2018-01-06 MED ORDER — METHOCARBAMOL 500 MG PO TABS
ORAL_TABLET | ORAL | Status: AC
  Filled 2018-01-06: qty 1

## 2018-01-06 MED ORDER — HEPARIN SODIUM (PORCINE) 1000 UNIT/ML IJ SOLN
INTRAMUSCULAR | Status: AC
  Filled 2018-01-06: qty 1

## 2018-01-06 MED ORDER — ACETAMINOPHEN 650 MG RE SUPP: 650.0000 mg | RECTAL | Status: DC | PRN

## 2018-01-06 MED ORDER — SUCCINYLCHOLINE CHLORIDE 200 MG/10ML IV SOSY
PREFILLED_SYRINGE | INTRAVENOUS | Status: AC
  Filled 2018-01-06: qty 10

## 2018-01-06 MED ORDER — ONDANSETRON HCL 4 MG/2ML IJ SOLN
INTRAMUSCULAR | Status: AC
  Filled 2018-01-06: qty 2

## 2018-01-06 MED ORDER — PROPOFOL 10 MG/ML IV BOLUS
INTRAVENOUS | Status: DC | PRN
  Administered 2018-01-06: 30 mg via INTRAVENOUS
  Administered 2018-01-06: 20 mg via INTRAVENOUS
  Administered 2018-01-06: 70 mg via INTRAVENOUS

## 2018-01-06 MED ORDER — CEFAZOLIN SODIUM-DEXTROSE 2-4 GM/100ML-% IV SOLN
2.0000 g | Freq: Three times a day (TID) | INTRAVENOUS | Status: AC
  Administered 2018-01-06 – 2018-01-07 (×2): 2 g via INTRAVENOUS
  Filled 2018-01-06 (×2): qty 100

## 2018-01-06 SURGICAL SUPPLY — 72 items
BAG DECANTER FOR FLEXI CONT (MISCELLANEOUS) ×3 IMPLANT
BASKET BONE COLLECTION (BASKET) ×3 IMPLANT
BENZOIN TINCTURE PRP APPL 2/3 (GAUZE/BANDAGES/DRESSINGS) ×3 IMPLANT
BLADE CLIPPER SURG (BLADE) IMPLANT
BUR MATCHSTICK NEURO 3.0 LAGG (BURR) ×6 IMPLANT
CANISTER SUCT 3000ML PPV (MISCELLANEOUS) ×3 IMPLANT
CARTRIDGE OIL MAESTRO DRILL (MISCELLANEOUS) ×4 IMPLANT
CONT SPEC 4OZ CLIKSEAL STRL BL (MISCELLANEOUS) ×3 IMPLANT
COVER BACK TABLE 60X90IN (DRAPES) ×3 IMPLANT
DERMABOND ADVANCED (GAUZE/BANDAGES/DRESSINGS) ×1
DERMABOND ADVANCED .7 DNX12 (GAUZE/BANDAGES/DRESSINGS) ×2 IMPLANT
DIFFUSER DRILL AIR PNEUMATIC (MISCELLANEOUS) ×6 IMPLANT
DRAPE C-ARM 42X72 X-RAY (DRAPES) ×3 IMPLANT
DRAPE C-ARMOR (DRAPES) ×3 IMPLANT
DRAPE LAPAROTOMY 100X72X124 (DRAPES) ×3 IMPLANT
DRAPE MICROSCOPE LEICA (MISCELLANEOUS) IMPLANT
DRAPE POUCH INSTRU U-SHP 10X18 (DRAPES) ×6 IMPLANT
DRAPE SURG 17X23 STRL (DRAPES) ×6 IMPLANT
DRSG OPSITE POSTOP 4X6 (GAUZE/BANDAGES/DRESSINGS) ×3 IMPLANT
DURAPREP 26ML APPLICATOR (WOUND CARE) ×3 IMPLANT
ELECT REM PT RETURN 9FT ADLT (ELECTROSURGICAL) ×3
ELECTRODE REM PT RTRN 9FT ADLT (ELECTROSURGICAL) ×2 IMPLANT
EVACUATOR 1/8 PVC DRAIN (DRAIN) ×3 IMPLANT
FLOSEAL 5ML (HEMOSTASIS) ×3 IMPLANT
GAUZE SPONGE 4X4 16PLY XRAY LF (GAUZE/BANDAGES/DRESSINGS) IMPLANT
GLOVE BIO SURGEON STRL SZ 6.5 (GLOVE) ×9 IMPLANT
GLOVE BIO SURGEON STRL SZ7 (GLOVE) IMPLANT
GLOVE BIO SURGEON STRL SZ8 (GLOVE) ×9 IMPLANT
GLOVE BIOGEL PI IND STRL 7.0 (GLOVE) IMPLANT
GLOVE BIOGEL PI IND STRL 7.5 (GLOVE) ×12 IMPLANT
GLOVE BIOGEL PI INDICATOR 7.0 (GLOVE)
GLOVE BIOGEL PI INDICATOR 7.5 (GLOVE) ×6
GOWN STRL REUS W/ TWL LRG LVL3 (GOWN DISPOSABLE) ×4 IMPLANT
GOWN STRL REUS W/ TWL XL LVL3 (GOWN DISPOSABLE) ×8 IMPLANT
GOWN STRL REUS W/TWL 2XL LVL3 (GOWN DISPOSABLE) IMPLANT
GOWN STRL REUS W/TWL LRG LVL3 (GOWN DISPOSABLE) ×2
GOWN STRL REUS W/TWL XL LVL3 (GOWN DISPOSABLE) ×4
HEMOSTAT POWDER KIT SURGIFOAM (HEMOSTASIS) IMPLANT
KIT BASIN OR (CUSTOM PROCEDURE TRAY) ×6 IMPLANT
KIT BONE MRW ASP ANGEL CPRP (KITS) ×3 IMPLANT
KIT TURNOVER KIT B (KITS) ×3 IMPLANT
MATRIX STRIP NEOCORE 12C (Putty) ×2 IMPLANT
MILL MEDIUM DISP (BLADE) ×3 IMPLANT
NEEDLE HYPO 25X1 1.5 SAFETY (NEEDLE) ×3 IMPLANT
NEEDLE SPNL 20GX3.5 QUINCKE YW (NEEDLE) ×3 IMPLANT
NS IRRIG 1000ML POUR BTL (IV SOLUTION) ×3 IMPLANT
OIL CARTRIDGE MAESTRO DRILL (MISCELLANEOUS) ×6
PACK LAMINECTOMY NEURO (CUSTOM PROCEDURE TRAY) ×3 IMPLANT
PAD ARMBOARD 7.5X6 YLW CONV (MISCELLANEOUS) ×18 IMPLANT
PUTTY DBM ALLOSYNC PURE 10CC (Putty) ×3 IMPLANT
ROD PC 5.5X60 TI ARSENAL (Rod) ×6 IMPLANT
RUBBERBAND STERILE (MISCELLANEOUS) IMPLANT
SCREW CANC CBX 5.0X35 (Screw) ×3 IMPLANT
SCREW CBX 5.0X40MM (Screw) ×3 IMPLANT
SCREW CORT CBX 5.5X40 (Screw) ×12 IMPLANT
SCREW SET SPINAL ARSENAL 47127 (Screw) ×18 IMPLANT
SPACER IDENTITI PS 10X9X25 10D (Spacer) ×6 IMPLANT
SPACER IDENTITI PS 9X9X25 10D (Spacer) ×6 IMPLANT
SPONGE LAP 4X18 RFD (DISPOSABLE) IMPLANT
SPONGE SURGIFOAM ABS GEL 100 (HEMOSTASIS) ×3 IMPLANT
SPONGE SURGIFOAM ABS GEL SZ50 (HEMOSTASIS) IMPLANT
STRIP CLOSURE SKIN 1/2X4 (GAUZE/BANDAGES/DRESSINGS) ×3 IMPLANT
STRIP MATRIX NEOCORE 12CC (Putty) ×1 IMPLANT
SUT VIC AB 0 CT1 18XCR BRD8 (SUTURE) ×4 IMPLANT
SUT VIC AB 0 CT1 8-18 (SUTURE) ×2
SUT VIC AB 2-0 CP2 18 (SUTURE) ×6 IMPLANT
SUT VIC AB 3-0 SH 8-18 (SUTURE) ×9 IMPLANT
SYR CONTROL 10ML LL (SYRINGE) ×3 IMPLANT
TOWEL GREEN STERILE (TOWEL DISPOSABLE) ×6 IMPLANT
TOWEL GREEN STERILE FF (TOWEL DISPOSABLE) ×3 IMPLANT
TRAY FOLEY MTR SLVR 16FR STAT (SET/KITS/TRAYS/PACK) ×3 IMPLANT
WATER STERILE IRR 1000ML POUR (IV SOLUTION) ×3 IMPLANT

## 2018-01-06 NOTE — Anesthesia Preprocedure Evaluation (Signed)
Anesthesia Evaluation  Patient identified by MRN, date of birth, ID band Patient awake    Reviewed: Allergy & Precautions, NPO status , Patient's Chart, lab work & pertinent test results  History of Anesthesia Complications (+) PONV and history of anesthetic complications  Airway Mallampati: III  TM Distance: >3 FB Neck ROM: Full    Dental  (+) Edentulous Upper   Pulmonary neg shortness of breath, asthma , neg sleep apnea, neg recent URI,    breath sounds clear to auscultation       Cardiovascular hypertension, Pt. on medications + Peripheral Vascular Disease   Rhythm:Regular     Neuro/Psych  Neuromuscular disease negative psych ROS   GI/Hepatic negative GI ROS, Neg liver ROS,   Endo/Other  BG 173, h/o glucose intolerance, consider DM workup  Renal/GU negative Renal ROS     Musculoskeletal  (+) Arthritis ,   Abdominal   Peds  Hematology  (+) anemia ,   Anesthesia Other Findings   Reproductive/Obstetrics                             Anesthesia Physical Anesthesia Plan  ASA: III  Anesthesia Plan: General   Post-op Pain Management:    Induction: Intravenous  PONV Risk Score and Plan: 4 or greater and Ondansetron and Dexamethasone  Airway Management Planned: Oral ETT  Additional Equipment: None  Intra-op Plan:   Post-operative Plan: Extubation in OR  Informed Consent: I have reviewed the patients History and Physical, chart, labs and discussed the procedure including the risks, benefits and alternatives for the proposed anesthesia with the patient or authorized representative who has indicated his/her understanding and acceptance.   Dental advisory given  Plan Discussed with: CRNA and Surgeon  Anesthesia Plan Comments:         Anesthesia Quick Evaluation

## 2018-01-06 NOTE — OR Nursing (Signed)
ACD-A EXPIRES 11/21 10 ML USED WITH ARTHREX ANGEL BONE MARROW ASPIRATION KIT

## 2018-01-06 NOTE — Op Note (Signed)
01/06/2018  1:10 PM  PATIENT:  Paula Griffith  72 y.o. female  PRE-OPERATIVE DIAGNOSIS:  Left L5-S1 stenosis, severe spinal stenosis L4-5 with spondylolisthesis, moderate spinal stenosis L3-4, moderately severe spinal stenosis L2-3, back and leg pain  POST-OPERATIVE DIAGNOSIS:  same  PROCEDURE:   1. Decompressive lumbar laminectomy L5 S1 left, L2-3 L3-4 and L4-5 bilaterally requiring more work than would be required for a simple exposure of the disk for PLIF in order to adequately decompress the neural elements and address the spinal stenosis, note that the L2-3 and L5-S1 levels are separate from a decompression for PLIF level 2. Posterior lumbar interbody fusion L3-4 and L4-5 using porous titanium interbody cages packed with morcellized allograft and autograft 3. Posterior fixation L3-L5 inclusive using Alphatec cortical pedicle screws.  4. Intertransverse arthrodesis L3-L5 left using morcellized autograft and allograft. 5. Bone marrow aspirate from the right iliac crest through a separate fascial incision  SURGEON:  Sherley Bounds, MD  ASSISTANTS: Glenford Peers FNP  ANESTHESIA:  General  EBL: 700 ml  Total I/O In: 2000 [I.V.:2000] Out: 855 [Urine:155; Blood:700]  BLOOD ADMINISTERED:none  DRAINS: none   INDICATION FOR PROCEDURE: This patient presented with severe back and leg pain. Imaging revealed final stenosis L2-3 L3-4 L4-5 and L5-S1 on the left with spondylolisthesis at L3-4 and L4-5. The patient tried a reasonable attempt at conservative medical measures without relief. I recommended decompression and instrumented fusion to address the stenosis as well as the segmental  instability.  Patient understood the risks, benefits, and alternatives and potential outcomes and wished to proceed.  PROCEDURE DETAILS:  The patient was brought to the operating room. After induction of generalized endotracheal anesthesia the patient was rolled into the prone position on chest rolls and all  pressure points were padded. The patient's lumbar region was cleaned and then prepped with DuraPrep and draped in the usual sterile fashion. Anesthesia was injected and then a dorsal midline incision was made and carried down to the lumbosacral fascia. The fascia was opened and the paraspinous musculature was taken down in a subperiosteal fashion to expose L2-3 L3-4 L4-5 and L5-S1. A self-retaining retractor was placed. Intraoperative fluoroscopy confirmed my level, and I started with placement of the L3 cortical pedicle screws. The pedicle screw entry zones were identified utilizing surface landmarks and  AP and lateral fluoroscopy. I scored the cortex with the high-speed drill and then used the hand drill to drill an upward and outward direction into the pedicle. I then tapped line to line. I then placed a 5.0 x 40 mm cortical pedicle screw into the pedicles of L3 bilaterally. I then dissected in a suprafascial plane over the right iliac crest and exposed iliac crest through a separate fascial incision. I used a Jamshidi needle to extract 60 mL of bone marrow aspirate which was then spun down for later arthrodesis. I then closed the fascia. I then turned my attention to the decompression and complete lumbar laminectomies, hemi- facetectomies, and foraminotomies were performed at L3-4 and L4-5. I performed a left-sided L5-S1 hemilaminectomy, medial facetectomy and foraminotomy for decompression of the left S1 nerve root. The patient had significant spinal stenosis and this required more work than would be required for a simple exposure of the disc for posterior lumbar interbody fusion which would only require a limited laminotomy. Much more generous decompression and generous foraminotomy was undertaken in order to adequately decompress the neural elements and address the patient's leg pain. The yellow ligament was removed to expose the  underlying dura and nerve roots, and generous foraminotomies were performed to  adequately decompress the neural elements. Both the exiting and traversing nerve roots were decompressed on both sides until a coronary dilator passed easily along the nerve roots. I performed a sublaminar decompression at L2-3 by the removing a small part of the inferior part of the spinous process and then using a high-speed drill to drill the inferior part of the lamina and the medial part of facet and then undercut and removed the yellow ligament with the Kerrison punches. This decompressed L2-3. Once the decompression was complete, I turned my attention to the posterior lower lumbar interbody fusion. The epidural venous vasculature was coagulated and cut sharply. Disc space was incised and the initial discectomy was performed with pituitary rongeurs. The disc space was distracted with sequential distractors to a height of 9 mm at L3-4 and 10 mm at L4- L5. We then used a series of scrapers and shavers to prepare the endplates for fusion. The midline was prepared with Epstein curettes. Once the complete discectomy was finished, we packed an appropriate sized interbody cage with local autograft and morcellized allograft, gently retracted the nerve root, and tapped the cage into position at L3-4 and L4-5.  The midline between the cages was packed with morselized autograft and allograft. We then turned our attention to the placement of the lower pedicle screws. The pedicle screw entry zones were identified utilizing surface landmarks and fluoroscopy. I drilled into each pedicle utilizing the hand drill, and tapped each pedicle with the appropriate tap. We palpated with a ball probe to assure no break in the cortex. We then placed I 0.5 x 40 mm cortical pedicle screws into the pedicles bilaterally at L4 and L5 bilaterally. We then decorticated the transverse processes and laid a mixture of morcellized autograft and allograft out over these to perform intertransverse arthrodesis at L3-4 and L4-5 on the left. We then  placed lordotic rods into the multiaxial screw heads of the pedicle screws and locked these in position with the locking caps and anti-torque device. We then checked our construct with AP and lateral fluoroscopy. Irrigated with copious amounts of bacitracin-containing saline solution. Inspected the nerve roots once again to assure adequate decompression, lined to the dura with Gelfoam, placed powdered vancomycin into the wound, and closed the muscle and the fascia with 0 Vicryl. Closed the subcutaneous tissues with 2-0 Vicryl and subcuticular tissues with 3-0 Vicryl. The skin was closed with benzoin and Steri-Strips. Dressing was then applied, the patient was awakened from general anesthesia and transported to the recovery room in stable condition. At the end of the procedure all sponge, needle and instrument counts were correct.   PLAN OF CARE: admit to inpatient  PATIENT DISPOSITION:  PACU - hemodynamically stable.   Delay start of Pharmacological VTE agent (>24hrs) due to surgical blood loss or risk of bleeding:  yes

## 2018-01-06 NOTE — H&P (Signed)
Subjective: Patient is a 72 y.o. female admitted for PLIF. Onset of symptoms was several years ago, gradually worsening since that time.  The pain is rated severe, and is located at the across the lower back and radiates to legs. The pain is described as aching and occurs all day. The symptoms have been progressive. Symptoms are exacerbated by exercise. MRI or CT showed spondylolisthesis with stenosis   Past Medical History:  Diagnosis Date  . Allergic rhinitis   . Asthma   . Cervicalgia   . Colon polyps   . Complication of anesthesia   . Diverticulosis of colon   . Glucose intolerance (impaired glucose tolerance)   . HTN (hypertension)   . LBP (low back pain)   . Leukemia ([place])   . Osteoarthritis   . PONV (postoperative nausea and vomiting)   . PVD (peripheral vascular disease) ([place]) 2011   Right Carotid Dr Scot Dock  . 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 Spondylolisthesis of lumbar region   . Thrombocytopathy [facility]) 2011   Dr Ralene Ok    Past Surgical History:  Procedure Laterality Date  . ABDOMINAL HYSTERECTOMY  1989  . ANTERIOR CERVICAL DECOMP/DISCECTOMY FUSION N/A 09/22/2017   Procedure: ANTERIOR CERVICAL DECOMPRESSION AND FUSION CERVICAL THREE-FOUR.;  Surgeon: Eustace Moore, MD;  Location: [facility];  Service: Neurosurgery;  Laterality: N/A;  anterior  . BREAST EXCISIONAL BIOPSY Left   . BREAST SURGERY     Left    Prior to Admission medications   Medication Sig Start Date End Date Taking? Authorizing Provider  albuterol (PROAIR HFA) 108 (90 Base) MCG/ACT inhaler Inhale 2 puffs into the lungs every 4 (four) hours as needed. For shortness of breath 04/20/16  Yes [name], [name], MD  amLODipine (NORVASC) 5 MG tablet TAKE 1 TABLET BY MOUTH EVERY DAY 07/05/17  Yes [name], [name], MD  anagrelide (AGRYLIN) 0.5 MG capsule TAKE ONE CAPSULE TWICE A DAY MON/WEDS/FRI AND TAKE ONE CAPSULE EVERY DAY ALL OTHER DAYS Patient taking differently: Take 0.5 mg by mouth See admin instructions.  TAKE ONE CAPSULE TWICE A DAY MON/WEDS/FRI  TAKE ONE CAPSULE EVERY DAY IN THE MORNING ALL OTHER DAYS 08/25/17  Yes Truitt Merle, MD  Ascorbic Acid (VITAMIN C) 1000 MG tablet Take 1,000 mg by mouth daily.   Yes [provider]  aspirin 325 MG EC tablet Take 325 mg by mouth daily after breakfast.     Yes [provider]  Biotin 10000 MCG TABS Take 10,000 mcg by mouth daily.   Yes [provider]  Cholecalciferol (VITAMIN D3) 2000 units TABS Take 2,000 Units by mouth daily.   Yes [provider]  gabapentin (NEURONTIN) 100 MG capsule Take 1 capsule (100 mg total) by mouth 3 (three) times daily. 06/09/17  Yes [name], [name], MD  ibuprofen (ADVIL,MOTRIN) 600 MG tablet TAKE 1 TABLET (600 MG TOTAL) BY MOUTH 2 (TWO) TIMES DAILY AS NEEDED. Patient taking differently: Take 600 mg by mouth 2 (two) times daily.  10/20/17  Yes [name], [name], MD  KLOR-CON 10 10 MEQ tablet TAKE 1 TABLET BY MOUTH ONCE DAILY 03/15/17  Yes [name], [name], MD  losartan-hydrochlorothiazide (HYZAAR) 100-25 MG tablet TAKE 1 TABLET BY MOUTH DAILY. 03/15/17  Yes [name], [name], MD  Multiple Vitamins-Minerals (ADULT GUMMY PO) Take 2 tablets by mouth daily.   Yes [provider]  trolamine salicylate (ASPERCREME) 10 % cream Apply 1 application topically 2 (two) times daily as needed for muscle pain.  Yes [provider]  HYDROcodone-acetaminophen (NORCO/VICODIN) 5-325 MG tablet Take 1 tablet by mouth every 4 (four) hours as needed for moderate pain. Patient not taking: Reported on 12/23/2017 09/23/17 09/23/18  Meyran, Ocie Cornfield, NP  methocarbamol (ROBAXIN) 500 MG tablet Take 1 tablet (500 mg total) by mouth every 6 (six) hours as needed for muscle spasms. Patient not taking: Reported on 12/23/2017 09/23/17   Meyran, Ocie Cornfield, NP   Allergies  Allergen Reactions  . Benazepril Cough    cough  . Other     BAND-AID CAUSE SKIN TEARS/BRUISES  . Codeine Nausea  Only  . Tramadol Other (See Comments)    Dizzy, nauseated    Social History   Tobacco Use  . Smoking status: Never Smoker  . Smokeless tobacco: Never Used  Substance Use Topics  . Alcohol use: No    Alcohol/week: 0.0 oz    Family History  Problem Relation Age of Onset  . Pancreatic cancer Mother   . Arthritis Mother        RA  . Early death Father 17       MI  . Coronary artery disease Unknown        Female 1st degree relative Female <60  . Lung cancer Unknown   . Diabetes Sister   . Breast cancer Maternal Grandmother   . Colon cancer Neg Hx   . Stomach cancer Neg Hx      Review of Systems  Positive ROS: neg  All other systems have been reviewed and were otherwise negative with the exception of those mentioned in the HPI and as above.  Objective: Vital signs in last 24 hours: Temp:  [97.8 F (36.6 C)] 97.8 F (36.6 C) (07/11 0618) Pulse Rate:  [86] 86 (07/11 0618) Resp:  [20] 20 (07/11 0618) BP: (163)/(59) 163/59 (07/11 0618) SpO2:  [100 %] 100 % (07/11 0618) Weight:  [64.4 kg (142 lb)] 64.4 kg (142 lb) (07/11 0618)  General Appearance: Alert, cooperative, no distress, appears stated age Head: Normocephalic, without obvious abnormality, atraumatic Eyes: PERRL, conjunctiva/corneas clear, EOM's intact    Neck: Supple, symmetrical, trachea midline Back: Symmetric, no curvature, ROM normal, no CVA tenderness Lungs:  respirations unlabored Heart: Regular rate and rhythm Abdomen: Soft, non-tender Extremities: Extremities normal, atraumatic, no cyanosis or edema Pulses: 2+ and symmetric all extremities Skin: Skin color, texture, turgor normal, no rashes or lesions  NEUROLOGIC:   Mental status: Alert and oriented x4,  no aphasia, good attention span, fund of knowledge, and memory Motor Exam - grossly normal Sensory Exam - grossly normal Reflexes: trace Coordination - grossly normal Gait - grossly normal Balance - grossly normal Cranial Nerves: I: smell Not  tested  II: visual acuity  OS: nl    OD: nl  II: visual fields Full to confrontation  II: pupils Equal, round, reactive to light  III,VII: ptosis None  III,IV,VI: extraocular muscles  Full ROM  V: mastication Normal  V: facial light touch sensation  Normal  V,VII: corneal reflex  Present  VII: facial muscle function - upper  Normal  VII: facial muscle function - lower Normal  VIII: hearing Not tested  IX: soft palate elevation  Normal  IX,X: gag reflex Present  XI: trapezius strength  5/5  XI: sternocleidomastoid strength 5/5  XI: neck flexion strength  5/5  XII: tongue strength  Normal    Data Review Lab Results  Component Value Date   WBC 5.5 12/29/2017   HGB 10.5 (L) 12/29/2017  HCT 31.4 (L) 12/29/2017   MCV 89.2 12/29/2017   PLT 316 12/29/2017   Lab Results  Component Value Date   NA 136 12/29/2017   K 3.2 (L) 12/29/2017   CL 99 12/29/2017   CO2 25 12/29/2017   BUN 11 12/29/2017   CREATININE 0.75 12/29/2017   GLUCOSE 173 (H) 12/29/2017   Lab Results  Component Value Date   INR 0.97 12/29/2017    Assessment/Plan:  Estimated body mass index is 25.97 kg/m as calculated from the following:   Height as of this encounter: 5\' 2"  (1.575 m).   Weight as of this encounter: 64.4 kg (142 lb). Patient admitted for PLIF. Patient has failed a reasonable attempt at conservative therapy.  I explained the condition and procedure to the patient and answered any questions.  Patient wishes to proceed with procedure as planned. Understands risks/ benefits and typical outcomes of procedure.   Chanon Loney S 01/06/2018 7:02 AM

## 2018-01-06 NOTE — Transfer of Care (Signed)
Immediate Anesthesia Transfer of Care Note  Patient: Paula Griffith  Procedure(s) Performed: Posterior Lumbar Interbody Fusion  Lumbar three-lumbar four - Lumbar four- lumbar five (N/A Back) laminectomy Lumbar five -Sacral one - left, Lumbar two-Lumbar three - bilateral (Left Back)  Patient Location: PACU  Anesthesia Type:General  Level of Consciousness: awake, alert , oriented and patient cooperative  Airway & Oxygen Therapy: Patient Spontanous Breathing and Patient connected to nasal cannula oxygen  Post-op Assessment: Report given to RN and Post -op Vital signs reviewed and stable  Post vital signs: Reviewed and stable  Last Vitals:  Vitals Value Taken Time  BP 173/80 01/06/2018  1:21 PM  Temp    Pulse 84 01/06/2018  1:26 PM  Resp 15 01/06/2018  1:26 PM  SpO2 100 % 01/06/2018  1:26 PM  Vitals shown include unvalidated device data.  Last Pain:  Vitals:   01/06/18 0618  TempSrc: Oral         Complications: No apparent anesthesia complications

## 2018-01-06 NOTE — Anesthesia Procedure Notes (Addendum)
Procedure Name: Intubation Date/Time: 01/06/2018 7:46 AM Performed by: Shirlyn Goltz, CRNA Pre-anesthesia Checklist: Patient identified, Emergency Drugs available, Suction available, Patient being monitored and Timeout performed Patient Re-evaluated:Patient Re-evaluated prior to induction Oxygen Delivery Method: Circle system utilized Preoxygenation: Pre-oxygenation with 100% oxygen Induction Type: IV induction Ventilation: Oral airway inserted - appropriate to patient size and Mask ventilation without difficulty Laryngoscope Size: Miller and 2 Grade View: Grade I Tube type: Oral Tube size: 7.5 mm Number of attempts: 1 Airway Equipment and Method: Stylet Placement Confirmation: ETT inserted through vocal cords under direct vision,  CO2 detector and breath sounds checked- equal and bilateral Secured at: 22 cm Tube secured with: Tape Dental Injury: Teeth and Oropharynx as per pre-operative assessment  Comments: Inserted by Viann Fish, SRNA

## 2018-01-07 ENCOUNTER — Encounter (HOSPITAL_COMMUNITY): Payer: Self-pay | Admitting: Neurological Surgery

## 2018-01-07 ENCOUNTER — Other Ambulatory Visit: Payer: Self-pay

## 2018-01-07 MED ORDER — ANAGRELIDE HCL 0.5 MG PO CAPS
0.5000 mg | ORAL_CAPSULE | ORAL | Status: DC
  Administered 2018-01-07: 0.5 mg via ORAL
  Filled 2018-01-07: qty 1

## 2018-01-07 MED ORDER — ANAGRELIDE HCL 0.5 MG PO CAPS
0.5000 mg | ORAL_CAPSULE | ORAL | Status: DC
  Administered 2018-01-08: 0.5 mg via ORAL
  Filled 2018-01-07: qty 1

## 2018-01-07 NOTE — Evaluation (Signed)
Physical Therapy Evaluation Patient Details Name: Paula Griffith MRN: [id number] DOB: Dec 01, 1945 Today's Date: 01/07/2018   History of Present Illness  pt is a 71 y/o female with pmh: HTN, PVD, OA, admitted for pain due to stenosis and sponsylolithesis in lumbar region.  pt s/p decompression L2 to S1, PLIF L34 and L45, posterior fixation L3-L5, bone grafting.  Clinical Impression  Pt admitted with/for lumbar fusion surgery.  Pt at min guard to supervision level and has heard all of the education 1 time..  Pt currently limited functionally due to the problems listed below.  (see problems list.)  Pt will benefit from PT to maximize function and safety to be able to get home safely with available assist.     Follow Up Recommendations No PT follow up;Supervision/Assistance - 24 hour    Equipment Recommendations  None recommended by PT    Recommendations for Other Services       Precautions / Restrictions Precautions Precautions: Back Required Braces or Orthoses: Spinal Brace Spinal Brace: Applied in sitting position      Mobility  Bed Mobility Overal bed mobility: Needs Assistance Bed Mobility: Rolling;Sidelying to Sit;Sit to Sidelying Rolling: Supervision Sidelying to sit: Min guard     Sit to sidelying: Min guard General bed mobility comments: instructed in and reinforced bed mobility/logroll technique.  Transfers Overall transfer level: Needs assistance   Transfers: Sit to/from Stand Sit to Stand: Min guard         General transfer comment: reinforced hand placement.  Ambulation/Gait Ambulation/Gait assistance: Min guard Gait Distance (Feet): 180 Feet Assistive device: None Gait Pattern/deviations: Step-through pattern Gait velocity: moderate Gait velocity interpretation: <1.8 ft/sec, indicate of risk for recurrent falls General Gait Details: shorter, tentative steps.  pt able to speed up to cuing  Stairs            Wheelchair Mobility    Modified  Rankin (Stroke Patients Only)       Balance Overall balance assessment: No apparent balance deficits (not formally assessed)                                           Pertinent Vitals/Pain Pain Assessment: Faces Faces Pain Scale: Hurts a little bit Pain Descriptors / Indicators: Grimacing;Guarding Pain Intervention(s): Monitored during session    Home Living Family/patient expects to be discharged to:: Private residence Living Arrangements: Other relatives Available Help at Discharge: Family;Available 24 hours/day Type of Home: House Home Access: Stairs to enter Entrance Stairs-Rails: None Entrance Stairs-Number of Steps: 3 Home Layout: One level Home Equipment: Walker - 4 wheels;Walker - 2 wheels;Cane - single point Additional Comments: Daughter plans to stay with pt initially to provide 24/7 support    Prior Function Level of Independence: Independent         Comments: Indep with mobility. Drives. 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    Exercises     Assessment/Plan    PT Assessment Patient needs continued PT services  PT Problem List Decreased activity tolerance;Decreased mobility;Decreased knowledge of precautions;Pain       PT Treatment Interventions Gait training;Stair training;Functional mobility training;Therapeutic  activities;Patient/family education    PT Goals (Current goals can be found in the Care Plan section)  Acute Rehab PT Goals Patient Stated Goal: Independent PT Goal Formulation: With patient Time For Goal Achievement: 01/14/18 Potential to Achieve Goals: Good    Frequency Min 5X/week   Barriers to discharge        Co-evaluation               AM-PAC PT "6 Clicks" Daily Activity  Outcome Measure Difficulty turning over in bed (including adjusting bedclothes, sheets and blankets)?: A Little Difficulty moving from lying on back to sitting on the side of the bed? : A Little Difficulty sitting down on and standing up from a chair with arms (e.g., wheelchair, bedside commode, etc,.)?: A Little Help needed moving to and from a bed to chair (including a wheelchair)?: A Little Help needed walking in hospital room?: A Little Help needed climbing 3-5 steps with a railing? : A Little 6 Click Score: 18    End of Session Equipment Utilized During Treatment: Back brace Activity Tolerance: Patient tolerated treatment well Patient left: in chair;with call bell/phone within reach;with chair alarm set Nurse Communication: Mobility status PT Visit Diagnosis: Other abnormalities of gait and mobility (R26.89);Pain Pain - part of body: (back)    Time: 1010-1034 PT Time Calculation (min) (ACUTE ONLY): 24 min   Charges:   PT Evaluation $PT Eval Low Complexity: 1 Low PT Treatments $Gait Training: 8-22 mins   PT G Codes:        Jan 11, 2018  Donnella Sham, PT [phone]  (pager)  Paula Griffith 01-11-2018, 10:58 AM

## 2018-01-07 NOTE — Anesthesia Postprocedure Evaluation (Signed)
Anesthesia Post Note  Patient: Paula Griffith  Procedure(s) Performed: Posterior Lumbar Interbody Fusion  Lumbar three-lumbar four - Lumbar four- lumbar five (N/A Back) laminectomy Lumbar five -Sacral one - left, Lumbar two-Lumbar three - bilateral (Left Back)     Patient location during evaluation: PACU Anesthesia Type: General Level of consciousness: awake and alert Pain management: pain level controlled Vital Signs Assessment: post-procedure vital signs reviewed and stable Respiratory status: spontaneous breathing, nonlabored ventilation, respiratory function stable and patient connected to nasal cannula oxygen Cardiovascular status: blood pressure returned to baseline and stable Postop Assessment: no apparent nausea or vomiting Anesthetic complications: no    Last Vitals:  Vitals:   01/07/18 0809 01/07/18 1203  BP:  139/62  Pulse:  87  Resp:  20  Temp: 36.9 C 37 C  SpO2:  99%    Last Pain:  Vitals:   01/07/18 1203  TempSrc: Oral  PainSc:                  [name]

## 2018-01-07 NOTE — Progress Notes (Signed)
Subjective: Patient reports mild back pain. Denies any leg pain. Has been up to the bathroom once this morning and got a little light headed. Otherwise doing well.  Objective: Vital signs in last 24 hours: Temp:  [97.7 F (36.5 C)-99.3 F (37.4 C)] 98.7 F (37.1 C) (07/12 0400) Pulse Rate:  [64-91] 91 (07/11 2247) Resp:  [11-18] 18 (07/11 1537) BP: (123-173)/(57-80) 123/65 (07/12 0400) SpO2:  [99 %-100 %] 99 % (07/11 2247)  Intake/Output from previous day: 07/11 0701 - 07/12 0700 In: 3295 [P.O.:200; I.V.:2545; IV Piggyback:350] Out: 1937 [Urine:341; Blood:700] Intake/Output this shift: No intake/output data recorded.  Neurologic: Grossly normal  Lab Results: Lab Results  Component Value Date   WBC 5.5 12/29/2017   HGB 10.5 (L) 12/29/2017   HCT 31.4 (L) 12/29/2017   MCV 89.2 12/29/2017   PLT 316 12/29/2017   Lab Results  Component Value Date   INR 0.97 12/29/2017   BMET Lab Results  Component Value Date   NA 136 12/29/2017   K 3.2 (L) 12/29/2017   CL 99 12/29/2017   CO2 25 12/29/2017   GLUCOSE 173 (H) 12/29/2017   BUN 11 12/29/2017   CREATININE 0.75 12/29/2017   CALCIUM 9.7 12/29/2017    Studies/Results: Chest 2 View  Result Date: 01/06/2018 CLINICAL DATA:  Spondylolisthesis.  Preop lumbar surgery. EXAM: CHEST - 2 VIEW COMPARISON:  None. FINDINGS: The cardiomediastinal contours are normal. The lungs are clear. Pulmonary vasculature is normal. No consolidation, pleural effusion, or pneumothorax. No acute osseous abnormalities are seen. Degenerative change in the thoracic spine. IMPRESSION: No acute findings. Electronically Signed   By: Jeb Levering M.D.   On: 01/06/2018 06:19   Dg Lumbar Spine 2-3 Views  Result Date: 01/06/2018 CLINICAL DATA:  L4 through S1 fusion and decompressive laminectomies at L2-3. EXAM: Operative LUMBAR SPINE - 2-3 VIEW COMPARISON:  Lumbar spine x-rays 09/06/2017. That examination demonstrated 5 non-rib-bearing lumbar vertebrae.  FINDINGS: Three spot images from the C-arm fluoroscopic device are submitted for interpretation postoperatively, AP and LATERAL views of the lumbar spine. Posterolateral fusion with hardware from L4 through L1 with interbody fusion plugs at L4-5 and L5-S1. Anatomic alignment. No visible complicating features. The radiologic technologist documented 90 seconds of fluoroscopy time. IMPRESSION: Posterolateral fusion with hardware from L4 through L1 with interbody fusion plugs at L4-5 and L5-S1. No acute complicating features. Electronically Signed   By: Evangeline Dakin M.D.   On: 01/06/2018 13:38   Dg C-arm 1-60 Min  Result Date: 01/06/2018 CLINICAL DATA:  L4 through S1 fusion and decompressive laminectomies at L2-3. EXAM: Operative LUMBAR SPINE - 2-3 VIEW COMPARISON:  Lumbar spine x-rays 09/06/2017. That examination demonstrated 5 non-rib-bearing lumbar vertebrae. FINDINGS: Three spot images from the C-arm fluoroscopic device are submitted for interpretation postoperatively, AP and LATERAL views of the lumbar spine. Posterolateral fusion with hardware from L4 through L1 with interbody fusion plugs at L4-5 and L5-S1. Anatomic alignment. No visible complicating features. The radiologic technologist documented 90 seconds of fluoroscopy time. IMPRESSION: Posterolateral fusion with hardware from L4 through L1 with interbody fusion plugs at L4-5 and L5-S1. No acute complicating features. Electronically Signed   By: Evangeline Dakin M.D.   On: 01/06/2018 13:38   Dg C-arm 1-60 Min  Result Date: 01/06/2018 CLINICAL DATA:  L4 through S1 fusion and decompressive laminectomies at L2-3. EXAM: Operative LUMBAR SPINE - 2-3 VIEW COMPARISON:  Lumbar spine x-rays 09/06/2017. That examination demonstrated 5 non-rib-bearing lumbar vertebrae. FINDINGS: Three spot images from the C-arm fluoroscopic device are  submitted for interpretation postoperatively, AP and LATERAL views of the lumbar spine. Posterolateral fusion with hardware  from L4 through L1 with interbody fusion plugs at L4-5 and L5-S1. Anatomic alignment. No visible complicating features. The radiologic technologist documented 90 seconds of fluoroscopy time. IMPRESSION: Posterolateral fusion with hardware from L4 through L1 with interbody fusion plugs at L4-5 and L5-S1. No acute complicating features. Electronically Signed   By: Evangeline Dakin M.D.   On: 01/06/2018 13:38    Assessment/Plan: Postop day 1 from lumbar fusion. Doing well. Therapy today and possibly home tomorrow depending on how she does   LOS: 1 day    Paula Griffith 01/07/2018, 7:40 AM

## 2018-01-07 NOTE — Progress Notes (Signed)
Looks great on postoperative day 1. Minimal pain. Has already been out of bed once. Good strength. Mobilize today.

## 2018-01-07 NOTE — Evaluation (Signed)
Occupational Therapy Evaluation and Discharge Patient Details Name: Paula Griffith MRN: [id number] DOB: [birth year] Today's Date: 01/07/2018    History of Present Illness pt is a 72 y/o female with pmh: HTN, PVD, OA, admitted for pain due to stenosis and sponsylolithesis in lumbar region.  pt s/p decompression L2 to S1, PLIF L34 and L45, posterior fixation L3-L5, bone grafting.   Clinical Impression   This 72 yo female admitted and underwent above presents to acute OT with all OT education completed, we will D/C from acute OT.    Follow Up Recommendations  No OT follow up;Supervision - Intermittent    Equipment Recommendations  None recommended by OT       Precautions / Restrictions Precautions Precautions: Back Precaution Booklet Issued: Yes (comment) Required Braces or Orthoses: Spinal Brace Spinal Brace: Applied in sitting position Restrictions Weight Bearing Restrictions: No      Mobility Bed Mobility Overal bed mobility: Modified Independent Bed Mobility: Rolling;Sidelying to Sit;Sit to Sidelying Rolling: Modified independent (Device/Increase time) Sidelying to sit: Modified independent (Device/Increase time)     Sit to sidelying: Modified independent (Device/Increase time)    Transfers Overall transfer level: Modified independent                    Balance Overall balance assessment: No apparent balance deficits (not formally assessed)                                         ADL either performed or assessed with clinical judgement   ADL                                         General ADL Comments: Educated pt and family on limiting sitting, use of wet wipes for back peri care, using two cups for mouth care, side step into tub, placement of items that pt may need throughout the day, sequence of dressing     Vision Patient Visual Report: No change from baseline              Pertinent Vitals/Pain Pain  Assessment: 0-10 Pain Score: 2  Pain Location: incision Pain Descriptors / Indicators: Aching;Sore Pain Intervention(s): Limited activity within patient's tolerance;Monitored during session;Repositioned     Hand Dominance Right   Extremity/Trunk Assessment Upper Extremity Assessment Upper Extremity Assessment: Overall WFL for tasks assessed           Communication Communication Communication: No difficulties   Cognition Arousal/Alertness: Awake/alert Behavior During Therapy: WFL for tasks assessed/performed Overall Cognitive Status: Within Functional Limits for tasks assessed                                                Home Living Family/patient expects to be discharged to:: Private residence Living Arrangements: Other relatives Available Help at Discharge: Family;Available 24 hours/day Type of Home: House Home Access: Stairs to enter [employer] of Steps: 3 Entrance Stairs-Rails: None Home Layout: One level     Bathroom Shower/Tub: Tub/shower unit;Curtain   Bathroom Toilet: Handicapped height Bathroom Accessibility: Yes   Home Equipment: Environmental consultant - 4 wheels;Walker - 2 wheels;Cane - single point;Grab bars - tub/shower   Additional Comments:  Daughter plans to stay with pt initially to provide 24/7 support      Prior Functioning/Environment Level of Independence: Independent        Comments: Indep with mobility. Drives. 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        OT Problem List: Decreased range of motion;Pain         OT Goals(Current goals can be found in the care plan section) Acute Rehab OT Goals Patient Stated Goal: to go home and be as independent as possible  OT Frequency:                AM-PAC PT "6 Clicks" Daily Activity     Outcome Measure Help from another person eating meals?: None Help from another person taking care of personal grooming?: None Help from another person toileting, which includes using  toliet, bedpan, or urinal?: None Help from another person bathing (including washing, rinsing, drying)?: None Help from another person to put on and taking off regular upper body clothing?: None Help from another person to put on and taking off regular lower body clothing?: None 6 Click Score: 24   End of Session Equipment Utilized During Treatment: Back brace  Activity Tolerance: Patient tolerated treatment well Patient left: in chair;with call bell/phone within reach;with family/visitor present  OT Visit Diagnosis: Pain Pain - part of body: (back)                Time: 7035-0093 OT Time Calculation (min): 25 min Charges:  OT General Charges $OT Visit: 1 Visit OT Evaluation $OT Eval Moderate Complexity: 1 Mod OT Treatments $Self Care/Home Management : 8-22 mins Golden [place], OTR/L [phone] 01/07/2018

## 2018-01-08 MED ORDER — OXYCODONE HCL 5 MG PO TABS: 5.0000 mg | ORAL_TABLET | ORAL | 0 refills | Status: DC | PRN

## 2018-01-08 MED ORDER — METHOCARBAMOL 500 MG PO TABS: 500.0000 mg | ORAL_TABLET | Freq: Four times a day (QID) | ORAL | 3 refills | Status: DC | PRN

## 2018-01-08 NOTE — Care Management (Signed)
RW and 3n1 ordered from [facility].  DME will be delivered to room prior to d/c.

## 2018-01-08 NOTE — Discharge Summary (Signed)
Physician Discharge Summary  Patient ID: Paula Griffith MRN: [id number] DOB/AGE: 1945/11/25 72 y.o.  Admit date: 01/06/2018 Discharge date: 01/08/2018  Admission Diagnoses: Lumbar stenosis L2-3 L3-4 L4-5 L5-S1 spondylolisthesis L4-5  Discharge Diagnoses: Lumbar stenosis L2-3 L3-4 L4-5 L5-S1, spondylolisthesis L4-5 neurogenic claudication, lumbar radiculopathy Active Problems:   S/P lumbar spinal fusion   Discharged Condition: good  Hospital Course: Patient was admitted to undergo surgical decompression at L2-3 and L5-S1 in addition to the L3-4 and L4-5 with fixation from L3-L5.  She tolerated surgery well.  Consults: None  Significant Diagnostic Studies: None  Treatments: surgery: Laminotomies L2-3 L3-4 L4-5 and L5-S1 posterior lumbar interbody arthrodesis L3-4 and L4-5 with pedicle fixation L3-L5.  Discharge Exam: Blood pressure (!) 127/52, pulse 88, temperature 98.9 F (37.2 C), temperature source Oral, resp. rate 20, height 5\' 2"  (1.575 m), weight 64.4 kg (142 lb), SpO2 95 %. Incision is clean and dry motor function is intact  Disposition: Discharge disposition: 01-Home or Self Care       Discharge Instructions    Call MD for:  redness, tenderness, or signs of infection (pain, swelling, redness, odor or green/yellow discharge around incision site)   Complete by:  As directed    Call MD for:  severe uncontrolled pain   Complete by:  As directed    Call MD for:  temperature >100.4   Complete by:  As directed    Diet - low sodium heart healthy   Complete by:  As directed    Increase activity slowly   Complete by:  As directed      Allergies as of 01/08/2018      Reactions   Benazepril Cough   cough   Other    BAND-AID CAUSE SKIN TEARS/BRUISES   Codeine Nausea Only   Tramadol Other (See Comments)   Dizzy, nauseated      Medication List    TAKE these medications   ADULT GUMMY PO Take 2 tablets by mouth daily.   albuterol 108 (90 Base) MCG/ACT  inhaler Commonly known as:  PROAIR HFA Inhale 2 puffs into the lungs every 4 (four) hours as needed. For shortness of breath   amLODipine 5 MG tablet Commonly known as:  NORVASC TAKE 1 TABLET BY MOUTH EVERY DAY   anagrelide 0.5 MG capsule Commonly known as:  AGRYLIN TAKE ONE CAPSULE TWICE A DAY MON/WEDS/FRI AND TAKE ONE CAPSULE EVERY DAY ALL OTHER DAYS What changed:    how much to take  how to take this  when to take this  additional instructions   aspirin 325 MG EC tablet Take 325 mg by mouth daily after breakfast.   Biotin 10000 MCG Tabs Take 10,000 mcg by mouth daily.   gabapentin 100 MG capsule Commonly known as:  NEURONTIN Take 1 capsule (100 mg total) by mouth 3 (three) times daily.   HYDROcodone-acetaminophen 5-325 MG tablet Commonly known as:  NORCO/VICODIN Take 1 tablet by mouth every 4 (four) hours as needed for moderate pain.   ibuprofen 600 MG tablet Commonly known as:  ADVIL,MOTRIN TAKE 1 TABLET (600 MG TOTAL) BY MOUTH 2 (TWO) TIMES DAILY AS NEEDED. What changed:  when to take this   KLOR-CON 10 10 MEQ tablet Generic drug:  potassium chloride TAKE 1 TABLET BY MOUTH ONCE DAILY   losartan-hydrochlorothiazide 100-25 MG tablet Commonly known as:  HYZAAR TAKE 1 TABLET BY MOUTH DAILY.   methocarbamol 500 MG tablet Commonly known as:  ROBAXIN Take 1 tablet (500 mg total) by mouth  every 6 (six) hours as needed for muscle spasms. What changed:  Another medication with the same name was added. Make sure you understand how and when to take each.   methocarbamol 500 MG tablet Commonly known as:  ROBAXIN Take 1 tablet (500 mg total) by mouth every 6 (six) hours as needed for muscle spasms. What changed:  You were already taking a medication with the same name, and this prescription was added. Make sure you understand how and when to take each.   oxyCODONE 5 MG immediate release tablet Commonly known as:  Oxy IR/ROXICODONE Take 1 tablet (5 mg total) by mouth  every 3 (three) hours as needed for moderate pain ((score 4 to 6)).   trolamine salicylate 10 % cream Commonly known as:  ASPERCREME Apply 1 application topically 2 (two) times daily as needed for muscle pain.   vitamin C 1000 MG tablet Take 1,000 mg by mouth daily.   Vitamin D3 2000 units Tabs Take 2,000 Units by mouth daily.            Durable Medical Equipment  (From admission, onward)        Start     Ordered   01/06/18 1551  DME Walker rolling  Once    Question:  Patient needs a walker to treat with the following condition  Answer:  S/P lumbar fusion   01/06/18 1551   01/06/18 1551  DME 3 n 1  Once     01/06/18 1551       Signed: Kristeen Miss J 01/08/2018, 10:14 AM

## 2018-01-08 NOTE — Progress Notes (Signed)
Patient discharged in stable condition with all belongings and family at bedside. Patient verbalized understanding of all discharge instructions and importance of follow up visits.

## 2018-01-10 ENCOUNTER — Telehealth: Payer: Self-pay | Admitting: *Deleted

## 2018-01-10 NOTE — Telephone Encounter (Signed)
Patient was admitted 01/06/18 to undergo surgical decompression at L2-3 and L5-S1 in addition to the L3-4 and L4-5 with fixation from L3-L5.  She tolerated surgery well,and was D/c 01/08/18. Will f/u w/surgeon Dr. Ellene Route in 2 weeks.Marland KitchenJohny Chess

## 2018-02-16 NOTE — Progress Notes (Signed)
[facility] OFFICE PROGRESS NOTE  [name], [name], MD [address] [place] [address]  DIAGNOSIS: Essential thrombocythemia   MPN (myeloproliferative neoplasm)  PROBLEM LIST: 1. Essential thrombocythemia with thrombocytosis going back to 2000. Mutations for JAK2 V617F, Exon 12, and MPL515 were negative. The patient had a bone marrow on 11/14/2009 which showed slightly hypercellular marrow for the patient's age, along with abundant iron stores. There was minimal reticulin fibrosis. Trichrome stains failed to show any definite collagenous fibrosis. The patient is maintained on hydroxyurea and aspirin since mid June 2011.  2. Right retinal artery occlusion and subsequent retinal infarct on 05/22/2009. The patient's vision has returned to near normal.  3. Peripheral vascular disease involving the right carotid artery detected 08/05/2009.  4. Hypertension  5. Allergic rhinitis  6. History of asthma  7. Adenomatous colonic polyps.  8. Diverticulosis  Chief complaint: Follow-up  PREVIOUS AND CURRENT THERAPY:  1.Hydrea 500 mg  Every day except 1,000 mg on M and Thurs. Decreased to 500mg  daily on 06/10/2015, and further decreased to 500mg  daily except M andThursday due to anemia, stopped on 07/30/2015 2. Added anagrelide 0.5 mg twice daily on 06/10/2015, increased to 1mg  am 0.5mg  pm on 07/30/2015, and decreased to 0.5mg  daily on 09/03/2015, changed to anagrelide 0.5 mg twice daily on MWF, and 0.5mg  daily on the rest of week in 03/2016  3. Aspirin 325 mg daily.   INTERVAL HISTORY:  Paula Griffith 72 y.o. female who is here for follow-up. She is here alone. She recovered well after surgery and is doing well. She takes hydrocodone sometimes for pain but tries not to use a lot of pain medications. She uses a cane to walk. She complains of left ankle swelling and right toe discoloration after her surgery. She is compliant with her BP medication. She had to stop some of her  medications for surgery, but has restarted after surgery.     MEDICAL HISTORY: Past Medical History:  Diagnosis Date  . Allergic rhinitis   . Asthma   . Cervicalgia   . Colon polyps   . Complication of anesthesia   . Diverticulosis of colon   . Glucose intolerance (impaired glucose tolerance)   . HTN (hypertension)   . LBP (low back pain)   . Leukemia ([place])   . Osteoarthritis   . PONV (postoperative nausea and vomiting)   . PVD (peripheral vascular disease) ([place]) 2011   Right Carotid Dr Scot Dock  . Retinal infarct 4818   embolic  . Spondylolisthesis of lumbar region   . Thrombocytopathy [facility]) 2011   Dr Ralene Ok      ALLERGIES:  is allergic to benazepril; other; codeine; and tramadol.  MEDICATIONS: has a current medication list which includes the following prescription(s): albuterol, amlodipine, anagrelide, vitamin c, aspirin, biotin, budesonide-formoterol, cholecalciferol, gabapentin, ibuprofen, klor-con 10, losartan-hydrochlorothiazide, and multivitamin.  SURGICAL HISTORY:  Past Surgical History:  Procedure Laterality Date  . ABDOMINAL HYSTERECTOMY  1989  . ANTERIOR CERVICAL DECOMP/DISCECTOMY FUSION N/A 09/22/2017   Procedure: ANTERIOR CERVICAL DECOMPRESSION AND FUSION CERVICAL THREE-FOUR.;  Surgeon: Eustace Moore, MD;  Location: [facility];  Service: Neurosurgery;  Laterality: N/A;  anterior  . BACK SURGERY    . BREAST EXCISIONAL BIOPSY Left   . BREAST SURGERY     Left  . LUMBAR LAMINECTOMY/DECOMPRESSION MICRODISCECTOMY Left 01/06/2018   Procedure: laminectomy Lumbar five -Sacral one - left, Lumbar two-Lumbar three - bilateral;  Surgeon: Eustace Moore, MD;  Location: [facility];  Service: Neurosurgery;  Laterality: Left;    REVIEW OF SYSTEMS:   Constitutional: Denies fevers, chills or abnormal weight loss  Eyes: Denies blurriness of vision Ears, nose, mouth, throat, and face: Denies mucositis or sore throat Respiratory: Denies cough, dyspnea or wheezes Cardiovascular:  Denies palpitation, chest discomfort or lower extremity swelling Gastrointestinal:  Denies nausea, heartburn or change in bowel habits Skin: Denies abnormal skin rashes Lymphatics: Denies new lymphadenopathy or easy bruising Neurological:Denies numbness, tingling or new weaknesses Behavioral/Psych: Mood is stable, no new changes  MSK: (+) back pain, recent surgery done (+) bilateral lower limb edema All other systems were reviewed with the patient and are negative.  PHYSICAL EXAMINATION:  ECOG PERFORMANCE STATUS: 1  Blood pressure (!) 180/76, pulse 72, temperature 98.9 F (37.2 C), temperature source Oral, resp. rate 20, height 5\' 2"  (1.575 m), weight 145 lb 4.8 oz (65.9 kg), SpO2 100 %.  GENERAL:alert, no distress and comfortable; well developed and well nourished. SKIN: skin color, texture, turgor are normal, no rashes or significant lesions EYES: normal, Conjunctiva are pink and non-injected, sclera clear OROPHARYNX:no exudate, no erythema and lips, buccal mucosa, and tongue normal  NECK: supple, thyroid normal size, non-tender, without nodularity LYMPH:  no palpable lymphadenopathy in the cervical, axillary or supraclavicular LUNGS: clear to auscultation and percussion with normal breathing effort HEART: regular rate & rhythm and no murmurs and (+) trace lower extremity edema ABDOMEN:abdomen soft, non-tender and normal bowel sounds Musculoskeletal:no cyanosis of digits and no clubbing ; surgical scar in her low back has healed well  NEURO: alert & oriented x 3 with fluent speech, no focal motor/sensory deficits.    LABORATORY DATA: CBC Latest Ref Rng & Units 02/23/2018 12/29/2017 11/24/2017  WBC 3.9 - 10.3 K/uL 4.6 5.5 4.3  Hemoglobin 11.6 - 15.9 g/dL 10.0(L) 10.5(L) 11.2(L)  Hematocrit 34.8 - 46.6 % 31.2(L) 31.4(L) 33.1(L)  Platelets 145 - 400 K/uL 305 316 337    CMP Latest Ref Rng & Units 12/29/2017 11/24/2017 09/21/2017  Glucose 70 - 99 mg/dL 173(H) 131 118(H)  BUN 8 - 23 mg/dL  11 13 8   Creatinine 0.44 - 1.00 mg/dL 0.75 0.95 0.70  Sodium 135 - 145 mmol/L 136 135(L) 138  Potassium 3.5 - 5.1 mmol/L 3.2(L) 3.6 3.4(L)  Chloride 98 - 111 mmol/L 99 99 105  CO2 22 - 32 mmol/L 25 23 24   Calcium 8.9 - 10.3 mg/dL 9.7 10.0 9.1  Total Protein 6.4 - 8.3 g/dL - 7.4 -  Total Bilirubin 0.2 - 1.2 mg/dL - 0.5 -  Alkaline Phos 40 - 150 U/L - 58 -  AST 5 - 34 U/L - 18 -  ALT 0 - 55 U/L - 8 -      RADIOGRAPHIC STUDIES: No results found.  ASSESSMENT:   Paula Griffith 72 y.o. female with a history of Essential thrombocythemia    1. Myeloproliferative neoplasm-essential thrombocythemia, JAK2 mutation negative.  -We previously reviewed the natural history of ET, most patients do well well, major complication is thrombosis. -She was on Hydrea before, I stopped due to her anemia. -she was switched to anagrelide for disease control. I discussed potential side effects, especially cardiac toxicities (palpitation, heart failure, prolonged QT) and slightly increased cardiovascular disease and mortality comparing to Hydrea. She voiced understanding and agrees to continue. We will monitor her EKG periodically. -Anemia has since improved. -Her anagrelide dose has been titrated, she is currently on anagrelide 0.5 mg twice daily on MWF, and 0.5mg  daily on the rest of week., platelet counts has  been normal, 297K, will continue anagrelide at current dose  -Labs reviewed and Hgb is 10.0 and levels overall within normal limits. Her WBC is 4.6.  She is clinically stable.  -She is tolerating anagrelide well.  -F/u in 6 months  -Labs every 3 months   2. Arthritis and back pain  -She will continue follow-up with her PCP and pain management clinic. -Her pain is mild, and she has no limitation on her activities   3. Hypertension and other medical problems -She'll continue follow-up with her primary care physician  4. Anemia  -secondary to MPN and previous hydrea  -overall stable, slightly  worse after her back surgery  -continue monitoring   Plan -continue anagrelide 0.5 mg twice daily on MWF, and 0.5mg  daily on the rest of week. -Lab in 3 months  -Lab, and f/u in 6 months    All questions were answered. The patient knows to call the clinic with any problems, questions or concerns. We can certainly see the patient much sooner if necessary.  I spent 15 minutes counseling the patient face to face. The total time spent in the appointment was 20 minutes.  Dierdre Searles Dweik am acting as scribe for Dr. Truitt Merle.  I have reviewed the above documentation for accuracy and completeness, and I agree with the above.    Truitt Merle, MD 02/23/2018 4:21 PM

## 2018-02-22 DIAGNOSIS — I10 Essential (primary) hypertension: Secondary | ICD-10-CM | POA: Diagnosis not present

## 2018-02-22 DIAGNOSIS — M4316 Spondylolisthesis, lumbar region: Secondary | ICD-10-CM | POA: Diagnosis not present

## 2018-02-23 ENCOUNTER — Encounter: Payer: Self-pay | Admitting: Hematology

## 2018-02-23 ENCOUNTER — Inpatient Hospital Stay: Payer: Medicare Other | Attending: Hematology | Admitting: Hematology

## 2018-02-23 ENCOUNTER — Telehealth: Payer: Self-pay

## 2018-02-23 ENCOUNTER — Inpatient Hospital Stay: Payer: Medicare Other

## 2018-02-23 VITALS — BP 180/76 | HR 72 | Temp 98.9°F | Resp 20 | Ht 62.0 in | Wt 145.3 lb

## 2018-02-23 DIAGNOSIS — D473 Essential (hemorrhagic) thrombocythemia: Secondary | ICD-10-CM

## 2018-02-23 DIAGNOSIS — D471 Chronic myeloproliferative disease: Secondary | ICD-10-CM | POA: Diagnosis not present

## 2018-02-23 DIAGNOSIS — M1990 Unspecified osteoarthritis, unspecified site: Secondary | ICD-10-CM | POA: Diagnosis not present

## 2018-02-23 DIAGNOSIS — D649 Anemia, unspecified: Secondary | ICD-10-CM

## 2018-02-23 DIAGNOSIS — I10 Essential (primary) hypertension: Secondary | ICD-10-CM | POA: Diagnosis not present

## 2018-02-23 DIAGNOSIS — M549 Dorsalgia, unspecified: Secondary | ICD-10-CM | POA: Diagnosis not present

## 2018-02-23 LAB — CBC WITH DIFFERENTIAL/PLATELET
Basophils Absolute: 0 10*3/uL (ref 0.0–0.1)
Basophils Relative: 1 %
Eosinophils Absolute: 0.1 10*3/uL (ref 0.0–0.5)
Eosinophils Relative: 1 %
HEMATOCRIT: 31.2 % — AB (ref 34.8–46.6)
Hemoglobin: 10 g/dL — ABNORMAL LOW (ref 11.6–15.9)
LYMPHS PCT: 31 %
Lymphs Abs: 1.4 10*3/uL (ref 0.9–3.3)
MCH: 28.5 pg (ref 25.1–34.0)
MCHC: 31.9 g/dL (ref 31.5–36.0)
MCV: 89.1 fL (ref 79.5–101.0)
MONO ABS: 0.5 10*3/uL (ref 0.1–0.9)
MONOS PCT: 11 %
Neutro Abs: 2.6 10*3/uL (ref 1.5–6.5)
Neutrophils Relative %: 56 %
Platelets: 305 10*3/uL (ref 145–400)
RBC: 3.5 MIL/uL — ABNORMAL LOW (ref 3.70–5.45)
RDW: 13.8 % (ref 11.2–14.5)
WBC: 4.6 10*3/uL (ref 3.9–10.3)

## 2018-02-23 NOTE — Telephone Encounter (Signed)
Printed avs and calender of upcoming appointment per 8/28 los

## 2018-02-24 ENCOUNTER — Encounter: Payer: Medicare Other | Admitting: Internal Medicine

## 2018-03-01 ENCOUNTER — Encounter: Payer: Self-pay | Admitting: Internal Medicine

## 2018-03-01 ENCOUNTER — Ambulatory Visit (INDEPENDENT_AMBULATORY_CARE_PROVIDER_SITE_OTHER): Payer: Medicare Other | Admitting: Internal Medicine

## 2018-03-01 ENCOUNTER — Other Ambulatory Visit (INDEPENDENT_AMBULATORY_CARE_PROVIDER_SITE_OTHER): Payer: Medicare Other

## 2018-03-01 VITALS — BP 190/80 | HR 76 | Ht 62.0 in | Wt 141.0 lb

## 2018-03-01 DIAGNOSIS — G8929 Other chronic pain: Secondary | ICD-10-CM

## 2018-03-01 DIAGNOSIS — Z0001 Encounter for general adult medical examination with abnormal findings: Secondary | ICD-10-CM

## 2018-03-01 DIAGNOSIS — Z0000 Encounter for general adult medical examination without abnormal findings: Secondary | ICD-10-CM | POA: Diagnosis not present

## 2018-03-01 DIAGNOSIS — I10 Essential (primary) hypertension: Secondary | ICD-10-CM | POA: Diagnosis not present

## 2018-03-01 DIAGNOSIS — R609 Edema, unspecified: Secondary | ICD-10-CM

## 2018-03-01 DIAGNOSIS — E785 Hyperlipidemia, unspecified: Secondary | ICD-10-CM

## 2018-03-01 DIAGNOSIS — M25552 Pain in left hip: Secondary | ICD-10-CM

## 2018-03-01 DIAGNOSIS — Z23 Encounter for immunization: Secondary | ICD-10-CM

## 2018-03-01 LAB — LIPID PANEL
CHOL/HDL RATIO: 3
CHOLESTEROL: 158 mg/dL (ref 0–200)
HDL: 47.1 mg/dL (ref >39.00)
LDL CALC: 94 mg/dL (ref 0–99)
NonHDL: 110.44
TRIGLYCERIDES: 84 mg/dL (ref 0.0–149.0)
VLDL: 16.8 mg/dL (ref 0.0–40.0)

## 2018-03-01 LAB — URINALYSIS
Bilirubin Urine: NEGATIVE
Hgb urine dipstick: NEGATIVE
KETONES UR: NEGATIVE
LEUKOCYTES UA: NEGATIVE
Nitrite: NEGATIVE
URINE GLUCOSE: NEGATIVE
UROBILINOGEN UA: 0.2 (ref 0.0–1.0)
pH: 6 (ref 5.0–8.0)

## 2018-03-01 LAB — CBC WITH DIFFERENTIAL/PLATELET
BASOS ABS: 0.1 10*3/uL (ref 0.0–0.1)
BASOS PCT: 1.2 % (ref 0.0–3.0)
EOS ABS: 0 10*3/uL (ref 0.0–0.7)
Eosinophils Relative: 0.8 % (ref 0.0–5.0)
HEMATOCRIT: 32.1 % — AB (ref 36.0–46.0)
Hemoglobin: 10.5 g/dL — ABNORMAL LOW (ref 12.0–15.0)
LYMPHS ABS: 1.7 10*3/uL (ref 0.7–4.0)
Lymphocytes Relative: 31.8 % (ref 12.0–46.0)
MCHC: 32.6 g/dL (ref 30.0–36.0)
MCV: 87.8 fl (ref 78.0–100.0)
MONO ABS: 0.5 10*3/uL (ref 0.1–1.0)
Monocytes Relative: 9.4 % (ref 3.0–12.0)
NEUTROS ABS: 3 10*3/uL (ref 1.4–7.7)
NEUTROS PCT: 56.8 % (ref 43.0–77.0)
PLATELETS: 312 10*3/uL (ref 150.0–400.0)
RBC: 3.65 Mil/uL — ABNORMAL LOW (ref 3.87–5.11)
RDW: 14.1 % (ref 11.5–15.5)
WBC: 5.3 10*3/uL (ref 4.0–10.5)

## 2018-03-01 LAB — BASIC METABOLIC PANEL
BUN: 15 mg/dL (ref 6–23)
CHLORIDE: 104 meq/L (ref 96–112)
CO2: 26 meq/L (ref 19–32)
CREATININE: 0.85 mg/dL (ref 0.40–1.20)
Calcium: 9.8 mg/dL (ref 8.4–10.5)
GFR: 84.46 mL/min (ref >60.00)
Glucose, Bld: 110 mg/dL — ABNORMAL HIGH (ref 70–99)
POTASSIUM: 3.3 meq/L — AB (ref 3.5–5.1)
SODIUM: 142 meq/L (ref 135–145)

## 2018-03-01 LAB — HEPATIC FUNCTION PANEL
ALK PHOS: 74 U/L (ref 39–117)
ALT: 5 U/L (ref 0–35)
AST: 18 U/L (ref 0–37)
Albumin: 4.1 g/dL (ref 3.5–5.2)
Bilirubin, Direct: 0.1 mg/dL (ref 0.0–0.3)
TOTAL PROTEIN: 7.5 g/dL (ref 6.0–8.3)
Total Bilirubin: 0.4 mg/dL (ref 0.2–1.2)

## 2018-03-01 LAB — TSH: TSH: 1.29 u[IU]/mL (ref 0.35–4.50)

## 2018-03-01 MED ORDER — FUROSEMIDE 20 MG PO TABS: 20.0000 mg | ORAL_TABLET | Freq: Every day | ORAL | 3 refills | Status: DC | PRN

## 2018-03-01 MED ORDER — CARVEDILOL 12.5 MG PO TABS: 12.5000 mg | ORAL_TABLET | Freq: Two times a day (BID) | ORAL | 11 refills | Status: DC

## 2018-03-01 NOTE — Assessment & Plan Note (Signed)
Worse Added Coreg Lasix prn

## 2018-03-01 NOTE — Progress Notes (Signed)
Subjective:  Patient ID: Paula Griffith, female    DOB: 1945/12/06  Age: 72 y.o. MRN: [id number]  CC: No chief complaint on file.   HPI ZENAYA ULATOWSKI presents for a well exam Recovering well w/recent back fusion. Was upset w/traffic [place] BP is better at home 168/80 C/o L hip pain  Outpatient Medications Prior to Visit  Medication Sig Dispense Refill  . albuterol (PROAIR HFA) 108 (90 Base) MCG/ACT inhaler Inhale 2 puffs into the lungs every 4 (four) hours as needed. For shortness of breath 1 Inhaler 3  . amLODipine (NORVASC) 5 MG tablet TAKE 1 TABLET BY MOUTH EVERY DAY 90 tablet 2  . anagrelide (AGRYLIN) 0.5 MG capsule TAKE ONE CAPSULE TWICE A DAY MON/WEDS/FRI AND TAKE ONE CAPSULE EVERY DAY ALL OTHER DAYS (Patient taking differently: Take 0.5 mg by mouth See admin instructions. TAKE ONE CAPSULE TWICE A DAY MON/WEDS/FRI  TAKE ONE CAPSULE EVERY DAY IN THE MORNING ALL OTHER DAYS) 90 capsule 3  . Ascorbic Acid (VITAMIN C) 1000 MG tablet Take 1,000 mg by mouth daily.    Marland Kitchen aspirin 325 MG EC tablet Take 325 mg by mouth daily after breakfast.      . Biotin 10000 MCG TABS Take 10,000 mcg by mouth daily.    . Cholecalciferol (VITAMIN D3) 2000 units TABS Take 2,000 Units by mouth daily.    Marland Kitchen gabapentin (NEURONTIN) 100 MG capsule Take 1 capsule (100 mg total) by mouth 3 (three) times daily. 270 capsule 1  . HYDROcodone-acetaminophen (NORCO/VICODIN) 5-325 MG tablet Take 1 tablet by mouth every 4 (four) hours as needed for moderate pain. 30 tablet 0  . ibuprofen (ADVIL,MOTRIN) 600 MG tablet TAKE 1 TABLET (600 MG TOTAL) BY MOUTH 2 (TWO) TIMES DAILY AS NEEDED. (Patient taking differently: Take 600 mg by mouth 2 (two) times daily. ) 180 tablet 1  . KLOR-CON 10 10 MEQ tablet TAKE 1 TABLET BY MOUTH ONCE DAILY 90 tablet 3  . losartan-hydrochlorothiazide (HYZAAR) 100-25 MG tablet TAKE 1 TABLET BY MOUTH DAILY. 90 tablet 3  . methocarbamol (ROBAXIN) 500 MG tablet Take 1 tablet (500 mg total) by mouth every 6  (six) hours as needed for muscle spasms. 30 tablet 0  . methocarbamol (ROBAXIN) 500 MG tablet Take 1 tablet (500 mg total) by mouth every 6 (six) hours as needed for muscle spasms. 40 tablet 3  . Multiple Vitamins-Minerals (ADULT GUMMY PO) Take 2 tablets by mouth daily.    Marland Kitchen oxyCODONE (OXY IR/ROXICODONE) 5 MG immediate release tablet Take 1 tablet (5 mg total) by mouth every 3 (three) hours as needed for moderate pain ((score 4 to 6)). 40 tablet 0  . trolamine salicylate (ASPERCREME) 10 % cream Apply 1 application topically 2 (two) times daily as needed for muscle pain.     No facility-administered medications prior to visit.     ROS: Review of Systems  Constitutional: Negative for activity change, appetite change, chills, fatigue and unexpected weight change.  HENT: Negative for congestion, mouth sores and sinus pressure.   Eyes: Negative for visual disturbance.  Respiratory: Negative for cough and chest tightness.   Gastrointestinal: Negative for abdominal pain and nausea.  Genitourinary: Negative for difficulty urinating, frequency and vaginal pain.  Musculoskeletal: Positive for gait problem. Negative for back pain.  Skin: Negative for pallor and rash.  Neurological: Negative for dizziness, tremors, weakness, numbness and headaches.  Psychiatric/Behavioral: Negative for confusion, sleep disturbance and suicidal ideas.    Objective:  BP (!) 208/80 (BP Location:  Left Arm, Patient Position: Sitting, Cuff Size: Normal)   Pulse 76   Ht 5\' 2"  (1.575 m)   Wt 141 lb (64 kg)   SpO2 99%   BMI 25.79 kg/m   BP Readings from Last 3 Encounters:  03/01/18 (!) 208/80  02/23/18 (!) 180/76  01/08/18 (!) 127/52    Wt Readings from Last 3 Encounters:  03/01/18 141 lb (64 kg)  02/23/18 145 lb 4.8 oz (65.9 kg)  01/06/18 142 lb (64.4 kg)    Physical Exam  Constitutional: She appears well-developed. No distress.  HENT:  Head: Normocephalic.  Right Ear: External ear normal.  Left Ear:  External ear normal.  Nose: Nose normal.  Mouth/Throat: Oropharynx is clear and moist.  Eyes: Pupils are equal, round, and reactive to light. Conjunctivae are normal. Right eye exhibits no discharge. Left eye exhibits no discharge.  Neck: Normal range of motion. Neck supple. No JVD present. No tracheal deviation present. No thyromegaly present.  Cardiovascular: Normal rate, regular rhythm and normal heart sounds.  Pulmonary/Chest: No stridor. No respiratory distress. She has no wheezes.  Abdominal: Soft. Bowel sounds are normal. She exhibits no distension and no mass. There is no tenderness. There is no rebound and no guarding.  Musculoskeletal: She exhibits tenderness. She exhibits no edema.  Lymphadenopathy:    She has no cervical adenopathy.  Neurological: She displays normal reflexes. No cranial nerve deficit. She exhibits normal muscle tone. Coordination abnormal.  Skin: No rash noted. No erythema.  Psychiatric: She has a normal mood and affect. Her behavior is normal. Judgment and thought content normal.  Cane Wound is healed Ankles w/trace edema L troch major is tender  Lab Results  Component Value Date   WBC 4.6 02/23/2018   HGB 10.0 (L) 02/23/2018   HCT 31.2 (L) 02/23/2018   PLT 305 02/23/2018   GLUCOSE 173 (H) 12/29/2017   CHOL 142 08/26/2017   TRIG 100.0 08/26/2017   HDL 48.30 08/26/2017   LDLCALC 73 08/26/2017   ALT 8 11/24/2017   AST 18 11/24/2017   NA 136 12/29/2017   K 3.2 (L) 12/29/2017   CL 99 12/29/2017   CREATININE 0.75 12/29/2017   BUN 11 12/29/2017   CO2 25 12/29/2017   TSH 1.07 08/26/2017   INR 0.97 12/29/2017   HGBA1C 6.8 (H) 08/26/2017    No results found.  Assessment & Plan:   There are no diagnoses linked to this encounter.   No orders of the defined types were placed in this encounter.    Follow-up: No follow-ups on file.  [name], MD

## 2018-03-01 NOTE — Assessment & Plan Note (Signed)
Worse post-op Furosemide prn

## 2018-03-01 NOTE — Assessment & Plan Note (Signed)
Ice  Massage

## 2018-03-01 NOTE — Patient Instructions (Addendum)
Trochanteric Bursitis Trochanteric bursitis is a condition that causes hip pain. Trochanteric bursitis happens when fluid-filled sacs (bursae) in the hip get irritated. Normally these sacs absorb shock and help strong bands of tissue (tendons) in your hip glide smoothly over each other and over your hip bones. What are the causes? This condition results from increased friction between the hip bones and the tendons that go over them. This condition can happen if you:  Have weak hips.  Use your hip muscles too much (overuse).  Get hit in the hip.  What increases the risk? This condition is more likely to develop in:  Women.  Adults who are middle-aged or older.  People with arthritis or a spinal condition.  People with weak buttocks muscles (gluteal muscles).  People who have one leg that is shorter than the other.  People who participate in certain kinds of athletic activities, such as: ? Running sports, especially long-distance running. ? Contact sports, like football or martial arts. ? Sports in which falls may occur, like skiing.  What are the signs or symptoms? The main symptom of this condition is pain and tenderness over the point of your hip. The pain may be:  Sharp and intense.  Dull and achy.  Felt on the outside of your thigh.  It may increase when you:  Lie on your side.  Walk or run.  Go up on stairs.  Sit.  Stand up after sitting.  Stand for long periods of time.  How is this diagnosed? This condition may be diagnosed based on:  Your symptoms.  Your medical history.  A physical exam.  Imaging tests, such as: ? X-rays to check your bones. ? An MRI or ultrasound to check your tendons and muscles.  During your physical exam, your health care provider will check the movement and strength of your hip. He or she may press on the point of your hip to check for pain. How is this treated? This condition may be treated by:  Resting.  Reducing  your activity.  Avoiding activities that cause pain.  Using crutches, a cane, or a walker to decrease the strain on your hip.  Taking medicine to help with swelling.  Having medicine injected into the bursae to help with swelling.  Using ice, heat, and massage therapy for pain relief.  Physical therapy exercises for strength and flexibility.  Surgery (rare).  Follow these instructions at home: Activity  Rest.  Avoid activities that cause pain.  Return to your normal activities as told by your health care provider. Ask your health care provider what activities are safe for you. Managing pain, stiffness, and swelling  Take over-the-counter and prescription medicines only as told by your health care provider.  If directed, apply heat to the injured area as told by your health care provider. ? Place a towel between your skin and the heat source. ? Leave the heat on for 20-30 minutes. ? Remove the heat if your skin turns bright red. This is especially important if you are unable to feel pain, heat, or cold. You may have a greater risk of getting burned.  If directed, apply ice to the injured area: ? Put ice in a plastic bag. ? Place a towel between your skin and the bag. ? Leave the ice on for 20 minutes, 2-3 times a day. General instructions  If the affected leg is one that you use for driving, ask your health care provider when it is safe to drive.  Use crutches, a cane, or a walker as told by your health care provider.  If one of your legs is shorter than the other, get fitted for a shoe insert.  Lose weight if you are overweight. How is this prevented?  Wear supportive footwear that is appropriate for your sport.  If you have hip pain, start any new exercise or sport slowly.  Maintain physical fitness, including: ? Strength. ? Flexibility. Contact a health care provider if:  Your pain does not improve with 2-4 weeks. Get help right away if:  You develop  severe pain.  You have a fever.  You develop increased redness over your hip.  You have a change in your bowel function or bladder function.  You cannot control the muscles in your feet. This information is not intended to replace advice given to you by your health care provider. Make sure you discuss any questions you have with your health care provider. Document Released: 07/23/2004 Document Revised: 02/19/2016 Document Reviewed: 05/31/2015 Elsevier Interactive Patient Education  2018 [place] Maintenance for Postmenopausal Women Menopause is a normal process in which your reproductive ability comes to an end. This process happens gradually over a span of months to years, usually between the ages of 67 and 28. Menopause is complete when you have missed 12 consecutive menstrual periods. It is important to talk with your health care provider about some of the most common conditions that affect postmenopausal women, such as heart disease, cancer, and bone loss (osteoporosis). Adopting a healthy lifestyle and getting preventive care can help to promote your health and wellness. Those actions can also lower your chances of developing some of these common conditions. What should I know about menopause? During menopause, you may experience a number of symptoms, such as:  Moderate-to-severe hot flashes.  Night sweats.  Decrease in sex drive.  Mood swings.  Headaches.  Tiredness.  Irritability.  Memory problems.  Insomnia.  Choosing to treat or not to treat menopausal changes is an individual decision that you make with your health care provider. What should I know about hormone replacement therapy and supplements? Hormone therapy products are effective for treating symptoms that are associated with menopause, such as hot flashes and night sweats. Hormone replacement carries certain risks, especially as you become older. If you are thinking about using estrogen or  estrogen with progestin treatments, discuss the benefits and risks with your health care provider. What should I know about heart disease and stroke? Heart disease, heart attack, and stroke become more likely as you age. This may be due, in part, to the hormonal changes that your body experiences during menopause. These can affect how your body processes dietary fats, triglycerides, and cholesterol. Heart attack and stroke are both medical emergencies. There are many things that you can do to help prevent heart disease and stroke:  Have your blood pressure checked at least every 1-2 years. High blood pressure causes heart disease and increases the risk of stroke.  If you are 3-59 years old, ask your health care provider if you should take aspirin to prevent a heart attack or a stroke.  Do not use any tobacco products, including cigarettes, chewing tobacco, or electronic cigarettes. If you need help quitting, ask your health care provider.  It is important to eat a healthy diet and maintain a healthy weight. ? Be sure to include plenty of vegetables, fruits, low-fat dairy products, and lean protein. ? Avoid eating foods that are high  in solid fats, added sugars, or salt (sodium).  Get regular exercise. This is one of the most important things that you can do for your health. ? Try to exercise for at least 150 minutes each week. The type of exercise that you do should increase your heart rate and make you sweat. This is known as moderate-intensity exercise. ? Try to do strengthening exercises at least twice each week. Do these in addition to the moderate-intensity exercise.  Know your numbers.Ask your health care provider to check your cholesterol and your blood glucose. Continue to have your blood tested as directed by your health care provider.  What should I know about cancer screening? There are several types of cancer. Take the following steps to reduce your risk and to catch any cancer  development as early as possible. Breast Cancer  Practice breast self-awareness. ? This means understanding how your breasts normally appear and feel. ? It also means doing regular breast self-exams. Let your health care provider know about any changes, no matter how small.  If you are 24 or older, have a clinician do a breast exam (clinical breast exam or CBE) every year. Depending on your age, family history, and medical history, it may be recommended that you also have a yearly breast X-ray (mammogram).  If you have a family history of breast cancer, talk with your health care provider about genetic screening.  If you are at high risk for breast cancer, talk with your health care provider about having an MRI and a mammogram every year.  Breast cancer (BRCA) gene test is recommended for women who have family members with BRCA-related cancers. Results of the assessment will determine the need for genetic counseling and BRCA1 and for BRCA2 testing. BRCA-related cancers include these types: ? Breast. This occurs in males or females. ? Ovarian. ? Tubal. This may also be called fallopian tube cancer. ? Cancer of the abdominal or pelvic lining (peritoneal cancer). ? Prostate. ? Pancreatic.  Cervical, Uterine, and Ovarian Cancer Your health care provider may recommend that you be screened regularly for cancer of the pelvic organs. These include your ovaries, uterus, and vagina. This screening involves a pelvic exam, which includes checking for microscopic changes to the surface of your cervix (Pap test).  For women ages 21-65, health care providers may recommend a pelvic exam and a Pap test every three years. For women ages 60-65, they may recommend the Pap test and pelvic exam, combined with testing for human papilloma virus (HPV), every five years. Some types of HPV increase your risk of cervical cancer. Testing for HPV may also be done on women of any age who have unclear Pap test  results.  Other health care providers may not recommend any screening for nonpregnant women who are considered low risk for pelvic cancer and have no symptoms. Ask your health care provider if a screening pelvic exam is right for you.  If you have had past treatment for cervical cancer or a condition that could lead to cancer, you need Pap tests and screening for cancer for at least 20 years after your treatment. If Pap tests have been discontinued for you, your risk factors (such as having a new sexual partner) need to be reassessed to determine if you should start having screenings again. Some women have medical problems that increase the chance of getting cervical cancer. In these cases, your health care provider may recommend that you have screening and Pap tests more often.  If you  have a family history of uterine cancer or ovarian cancer, talk with your health care provider about genetic screening.  If you have vaginal bleeding after reaching menopause, tell your health care provider.  There are currently no reliable tests available to screen for ovarian cancer.  Lung Cancer Lung cancer screening is recommended for adults 62-27 years old who are at high risk for lung cancer because of a history of smoking. A yearly low-dose CT scan of the lungs is recommended if you:  Currently smoke.  Have a history of at least 30 pack-years of smoking and you currently smoke or have quit within the past 15 years. A pack-year is smoking an average of one pack of cigarettes per day for one year.  Yearly screening should:  Continue until it has been 15 years since you quit.  Stop if you develop a health problem that would prevent you from having lung cancer treatment.  Colorectal Cancer  This type of cancer can be detected and can often be prevented.  Routine colorectal cancer screening usually begins at age 26 and continues through age 34.  If you have risk factors for colon cancer, your health  care provider may recommend that you be screened at an earlier age.  If you have a family history of colorectal cancer, talk with your health care provider about genetic screening.  Your health care provider may also recommend using home test kits to check for hidden blood in your stool.  A small camera at the end of a tube can be used to examine your colon directly (sigmoidoscopy or colonoscopy). This is done to check for the earliest forms of colorectal cancer.  Direct examination of the colon should be repeated every 5-10 years until age 64. However, if early forms of precancerous polyps or small growths are found or if you have a family history or genetic risk for colorectal cancer, you may need to be screened more often.  Skin Cancer  Check your skin from head to toe regularly.  Monitor any moles. Be sure to tell your health care provider: ? About any new moles or changes in moles, especially if there is a change in a mole's shape or color. ? If you have a mole that is larger than the size of a pencil eraser.  If any of your family members has a history of skin cancer, especially at a young age, talk with your health care provider about genetic screening.  Always use sunscreen. Apply sunscreen liberally and repeatedly throughout the day.  Whenever you are outside, protect yourself by wearing long sleeves, pants, a wide-brimmed hat, and sunglasses.  What should I know about osteoporosis? Osteoporosis is a condition in which bone destruction happens more quickly than new bone creation. After menopause, you may be at an increased risk for osteoporosis. To help prevent osteoporosis or the bone fractures that can happen because of osteoporosis, the following is recommended:  If you are 7-89 years old, get at least 1,000 mg of calcium and at least 600 mg of vitamin D per day.  If you are older than age 23 but younger than age 53, get at least 1,200 mg of calcium and at least 600 mg of  vitamin D per day.  If you are older than age 107, get at least 1,200 mg of calcium and at least 800 mg of vitamin D per day.  Smoking and excessive alcohol intake increase the risk of osteoporosis. Eat foods that are rich in calcium  and vitamin D, and do weight-bearing exercises several times each week as directed by your health care provider. What should I know about how menopause affects my mental health? Depression may occur at any age, but it is more common as you become older. Common symptoms of depression include:  Low or sad mood.  Changes in sleep patterns.  Changes in appetite or eating patterns.  Feeling an overall lack of motivation or enjoyment of activities that you previously enjoyed.  Frequent crying spells.  Talk with your health care provider if you think that you are experiencing depression. What should I know about immunizations? It is important that you get and maintain your immunizations. These include:  Tetanus, diphtheria, and pertussis (Tdap) booster vaccine.  Influenza every year before the flu season begins.  Pneumonia vaccine.  Shingles vaccine.  Your health care provider may also recommend other immunizations. This information is not intended to replace advice given to you by your health care provider. Make sure you discuss any questions you have with your health care provider. Document Released: 08/07/2005 Document Revised: 01/03/2016 Document Reviewed: 03/19/2015 Elsevier Interactive Patient Education  2018 [employer].

## 2018-03-01 NOTE — Assessment & Plan Note (Signed)
Here for medicare wellness/physical  Diet: heart healthy  Physical activity: not sedentary  Depression/mood screen: negative  Hearing: intact to whispered voice  Visual acuity: grossly normal w/glasses, performs annual eye exam  ADLs: capable  Fall risk: low to none - cane Home safety: good   Cognitive evaluation: intact to orientation, naming, recall and repetition  EOL planning: adv directives, full code/ I agree  I have personally reviewed and have noted  1. The patient's medical, surgical and social history  2. Their use of alcohol, tobacco or illicit drugs  3. Their current medications and supplements  4. The patient's functional ability including ADL's, fall risks, home safety risks and hearing or visual impairment.  5. Diet and physical activities  6. Evidence for depression or mood disorders 7. The roster of all physicians providing medical care to patient - is listed in the Snapshot section of the chart and reviewed today.    Today patient counseled on age appropriate routine health concerns for screening and prevention, each reviewed and up to date or declined. Immunizations reviewed and up to date or declined. Labs ordered and reviewed. Risk factors for depression reviewed and negative. Hearing function and visual acuity are intact. ADLs screened and addressed as needed. Functional ability and level of safety reviewed and appropriate. Education, counseling and referrals performed based on assessed risks today. Patient provided with a copy of personalized plan for preventive services.    Pt declined shingrix Dr Henrene Pastor: colon due in 2024

## 2018-03-05 ENCOUNTER — Other Ambulatory Visit: Payer: Self-pay | Admitting: Internal Medicine

## 2018-03-17 ENCOUNTER — Other Ambulatory Visit: Payer: Self-pay | Admitting: Internal Medicine

## 2018-03-24 ENCOUNTER — Other Ambulatory Visit: Payer: Self-pay | Admitting: Internal Medicine

## 2018-03-27 ENCOUNTER — Other Ambulatory Visit: Payer: Self-pay | Admitting: Internal Medicine

## 2018-03-31 ENCOUNTER — Other Ambulatory Visit: Payer: Self-pay | Admitting: Internal Medicine

## 2018-03-31 DIAGNOSIS — Z1231 Encounter for screening mammogram for malignant neoplasm of breast: Secondary | ICD-10-CM

## 2018-04-05 ENCOUNTER — Encounter: Payer: Self-pay | Admitting: Internal Medicine

## 2018-04-05 ENCOUNTER — Ambulatory Visit (INDEPENDENT_AMBULATORY_CARE_PROVIDER_SITE_OTHER): Payer: Medicare Other | Admitting: Internal Medicine

## 2018-04-05 DIAGNOSIS — I10 Essential (primary) hypertension: Secondary | ICD-10-CM

## 2018-04-05 MED ORDER — GABAPENTIN 100 MG PO CAPS: 100.0000 mg | ORAL_CAPSULE | Freq: Three times a day (TID) | ORAL | 3 refills | Status: DC

## 2018-04-05 MED ORDER — CARVEDILOL 25 MG PO TABS: 25.0000 mg | ORAL_TABLET | Freq: Two times a day (BID) | ORAL | 3 refills | Status: DC

## 2018-04-05 NOTE — Patient Instructions (Signed)
Compression socks Elevate legs Stop Amlodipine

## 2018-04-05 NOTE — Assessment & Plan Note (Signed)
Losartan HCT, Amlodipine 

## 2018-04-05 NOTE — Progress Notes (Signed)
Subjective:  Patient ID: Paula Griffith, female    DOB: 10-09-1945  Age: 72 y.o. MRN: [id number]  CC: No chief complaint on file.   HPI Paula Griffith presents for R earache and dizziness F/u LBP - better post-op: pain is 1/10 C/o feet swelling C/o a knot on L foot F/u HTN    Outpatient Medications Prior to Visit  Medication Sig Dispense Refill  . albuterol (PROAIR HFA) 108 (90 Base) MCG/ACT inhaler Inhale 2 puffs into the lungs every 4 (four) hours as needed. For shortness of breath 1 Inhaler 3  . amLODipine (NORVASC) 5 MG tablet TAKE 1 TABLET BY MOUTH EVERY DAY 90 tablet 1  . anagrelide (AGRYLIN) 0.5 MG capsule TAKE ONE CAPSULE TWICE A DAY MON/WEDS/FRI AND TAKE ONE CAPSULE EVERY DAY ALL OTHER DAYS (Patient taking differently: Take 0.5 mg by mouth See admin instructions. TAKE ONE CAPSULE TWICE A DAY MON/WEDS/FRI  TAKE ONE CAPSULE EVERY DAY IN THE MORNING ALL OTHER DAYS) 90 capsule 3  . Ascorbic Acid (VITAMIN C) 1000 MG tablet Take 1,000 mg by mouth daily.    Marland Kitchen aspirin 325 MG EC tablet Take 325 mg by mouth daily after breakfast.      . Biotin 10000 MCG TABS Take 10,000 mcg by mouth daily.    . carvedilol (COREG) 12.5 MG tablet Take 1 tablet (12.5 mg total) by mouth 2 (two) times daily with a meal. 60 tablet 11  . Cholecalciferol (VITAMIN D3) 2000 units TABS Take 2,000 Units by mouth daily.    . furosemide (LASIX) 20 MG tablet Take 1-2 tablets (20-40 mg total) by mouth daily as needed. 30 tablet 3  . gabapentin (NEURONTIN) 100 MG capsule TAKE 1 CAPSULE BY MOUTH THREE TIMES A DAY 270 capsule 1  . HYDROcodone-acetaminophen (NORCO/VICODIN) 5-325 MG tablet Take 1 tablet by mouth every 4 (four) hours as needed for moderate pain. 30 tablet 0  . ibuprofen (ADVIL,MOTRIN) 600 MG tablet TAKE 1 TABLET (600 MG TOTAL) BY MOUTH 2 (TWO) TIMES DAILY AS NEEDED. (Patient taking differently: Take 600 mg by mouth 2 (two) times daily. ) 180 tablet 1  . losartan-hydrochlorothiazide (HYZAAR) 100-25 MG tablet  TAKE 1 TABLET BY MOUTH DAILY. 90 tablet 3  . methocarbamol (ROBAXIN) 500 MG tablet Take 1 tablet (500 mg total) by mouth every 6 (six) hours as needed for muscle spasms. 30 tablet 0  . methocarbamol (ROBAXIN) 500 MG tablet Take 1 tablet (500 mg total) by mouth every 6 (six) hours as needed for muscle spasms. 40 tablet 3  . Multiple Vitamins-Minerals (ADULT GUMMY PO) Take 2 tablets by mouth daily.    Marland Kitchen oxyCODONE (OXY IR/ROXICODONE) 5 MG immediate release tablet Take 1 tablet (5 mg total) by mouth every 3 (three) hours as needed for moderate pain ((score 4 to 6)). 40 tablet 0  . potassium chloride (K-DUR) 10 MEQ tablet TAKE 1 TABLET BY MOUTH ONCE DAILY 90 tablet 3  . trolamine salicylate (ASPERCREME) 10 % cream Apply 1 application topically 2 (two) times daily as needed for muscle pain.     No facility-administered medications prior to visit.     ROS: Review of Systems  Constitutional: Negative for activity change, appetite change, chills, fatigue and unexpected weight change.  HENT: Negative for congestion, mouth sores and sinus pressure.   Eyes: Negative for visual disturbance.  Respiratory: Negative for cough and chest tightness.   Cardiovascular: Positive for leg swelling.  Gastrointestinal: Negative for abdominal pain and nausea.  Genitourinary: Negative for  difficulty urinating, frequency and vaginal pain.  Musculoskeletal: Positive for arthralgias. Negative for back pain and gait problem.  Skin: Negative for pallor and rash.  Neurological: Negative for dizziness, tremors, weakness, numbness and headaches.  Psychiatric/Behavioral: Negative for confusion and sleep disturbance.    Objective:  BP (!) 178/80 (BP Location: Left Arm, Patient Position: Sitting, Cuff Size: Normal)   Pulse 69   Temp 98.6 F (37 C) (Oral)   Ht 5\' 2"  (1.575 m)   Wt 147 lb (66.7 kg)   SpO2 99%   BMI 26.89 kg/m   BP Readings from Last 3 Encounters:  04/05/18 (!) 178/80  03/01/18 (!) 190/80  02/23/18  (!) 180/76    Wt Readings from Last 3 Encounters:  04/05/18 147 lb (66.7 kg)  03/01/18 141 lb (64 kg)  02/23/18 145 lb 4.8 oz (65.9 kg)    Physical Exam  Constitutional: She appears well-developed. No distress.  HENT:  Head: Normocephalic.  Right Ear: External ear normal.  Left Ear: External ear normal.  Nose: Nose normal.  Mouth/Throat: Oropharynx is clear and moist.  Eyes: Pupils are equal, round, and reactive to light. Conjunctivae are normal. Right eye exhibits no discharge. Left eye exhibits no discharge.  Neck: Normal range of motion. Neck supple. No JVD present. No tracheal deviation present. No thyromegaly present.  Cardiovascular: Normal rate, regular rhythm and normal heart sounds.  Pulmonary/Chest: No stridor. No respiratory distress. She has no wheezes.  Abdominal: Soft. Bowel sounds are normal. She exhibits no distension and no mass. There is no tenderness. There is no rebound and no guarding.  Musculoskeletal: She exhibits edema and tenderness.  Lymphadenopathy:    She has no cervical adenopathy.  Neurological: She displays normal reflexes. No cranial nerve deficit. She exhibits normal muscle tone. Coordination normal.  Skin: No rash noted. No erythema.  Psychiatric: She has a normal mood and affect. Her behavior is normal. Judgment and thought content normal.  B feet edema trace to 1+ No mass LS w/a brace  Lab Results  Component Value Date   WBC 5.3 03/01/2018   HGB 10.5 (L) 03/01/2018   HCT 32.1 (L) 03/01/2018   PLT 312.0 03/01/2018   GLUCOSE 110 (H) 03/01/2018   CHOL 158 03/01/2018   TRIG 84.0 03/01/2018   HDL 47.10 03/01/2018   LDLCALC 94 03/01/2018   ALT 5 03/01/2018   AST 18 03/01/2018   NA 142 03/01/2018   K 3.3 (L) 03/01/2018   CL 104 03/01/2018   CREATININE 0.85 03/01/2018   BUN 15 03/01/2018   CO2 26 03/01/2018   TSH 1.29 03/01/2018   INR 0.97 12/29/2017   HGBA1C 6.8 (H) 08/26/2017    No results found.  Assessment & Plan:   There are  no diagnoses linked to this encounter.   No orders of the defined types were placed in this encounter.    Follow-up: No follow-ups on file.  [name], MD

## 2018-04-07 ENCOUNTER — Ambulatory Visit
Admission: RE | Admit: 2018-04-07 | Discharge: 2018-04-07 | Disposition: A | Discharge status: Home / Self Care | Payer: Medicare Other | Source: Ambulatory Visit | Attending: Internal Medicine | Admitting: Internal Medicine

## 2018-04-07 DIAGNOSIS — Z1231 Encounter for screening mammogram for malignant neoplasm of breast: Secondary | ICD-10-CM

## 2018-04-10 ENCOUNTER — Other Ambulatory Visit: Payer: Self-pay | Admitting: Internal Medicine

## 2018-05-09 ENCOUNTER — Other Ambulatory Visit: Payer: Self-pay | Admitting: Internal Medicine

## 2018-05-09 ENCOUNTER — Telehealth: Payer: Self-pay

## 2018-05-09 NOTE — Telephone Encounter (Signed)
Please let her know that she can change back to [facility], she stopped it in 2017 due to mild anemia, OK to switch back. If she does not want to, then change to anagrelide 1mg  daily except 2 days a week. Thanks    Truitt Merle MD

## 2018-05-09 NOTE — Telephone Encounter (Signed)
Patient is unable to find anagrelide anywhere.  [facility] and they only have 1 mg tablets no .5 mg tablets.  Patient currently takes 1 tablet twice daily on Monday/Wednesday/Friday and 1 tablet on other days.  Patient would like to know if there is a medication other than Anagrelide?

## 2018-05-10 ENCOUNTER — Telehealth: Payer: Self-pay

## 2018-05-10 ENCOUNTER — Other Ambulatory Visit: Payer: Self-pay

## 2018-05-10 DIAGNOSIS — D471 Chronic myeloproliferative disease: Secondary | ICD-10-CM

## 2018-05-10 MED ORDER — ANAGRELIDE HCL 1 MG PO CAPS
1.0000 mg | ORAL_CAPSULE | Freq: Every day | ORAL | 2 refills | Status: DC
Start: 2018-05-10 — End: 2018-08-24

## 2018-05-10 NOTE — Telephone Encounter (Signed)
Spoke with patient per Dr. Burr Medico gave patient option to switch back to [facility] on take Anagrelide 1 mg daily except 2 days a week.  Patient prefers to take the 1 mg Anagrelide.  Requests a script be sent into [facility].  Script sent.

## 2018-05-13 ENCOUNTER — Other Ambulatory Visit: Payer: Self-pay | Admitting: Hematology

## 2018-05-23 ENCOUNTER — Encounter: Payer: Self-pay | Admitting: Internal Medicine

## 2018-05-23 ENCOUNTER — Other Ambulatory Visit: Payer: Self-pay | Admitting: Internal Medicine

## 2018-05-23 ENCOUNTER — Ambulatory Visit (INDEPENDENT_AMBULATORY_CARE_PROVIDER_SITE_OTHER): Payer: Medicare Other | Admitting: Internal Medicine

## 2018-05-23 DIAGNOSIS — R609 Edema, unspecified: Secondary | ICD-10-CM | POA: Diagnosis not present

## 2018-05-23 DIAGNOSIS — I10 Essential (primary) hypertension: Secondary | ICD-10-CM | POA: Diagnosis not present

## 2018-05-23 DIAGNOSIS — R739 Hyperglycemia, unspecified: Secondary | ICD-10-CM | POA: Diagnosis not present

## 2018-05-23 MED ORDER — ALBUTEROL SULFATE HFA 108 (90 BASE) MCG/ACT IN AERS: 2.0000 | INHALATION_SPRAY | RESPIRATORY_TRACT | 3 refills | Status: DC | PRN

## 2018-05-23 MED ORDER — METHOCARBAMOL 500 MG PO TABS: 500.0000 mg | ORAL_TABLET | Freq: Four times a day (QID) | ORAL | 3 refills | Status: DC | PRN

## 2018-05-23 MED ORDER — MOMETASONE FURO-FORMOTEROL FUM 100-5 MCG/ACT IN AERO: 2.0000 | INHALATION_SPRAY | Freq: Every day | RESPIRATORY_TRACT | 11 refills | Status: DC | PRN

## 2018-05-23 NOTE — Patient Instructions (Signed)

## 2018-05-23 NOTE — Assessment & Plan Note (Signed)
Labs

## 2018-05-23 NOTE — Assessment & Plan Note (Signed)
resolved 

## 2018-05-23 NOTE — Progress Notes (Signed)
Subjective:  Patient ID: Paula Griffith, female    DOB: Apr 20, 1946  Age: 72 y.o. MRN: [id number]  CC: No chief complaint on file.   HPI DAANA PETRASEK presents for HTN, LBP, asthma f/u. BP is OK at home  Outpatient Medications Prior to Visit  Medication Sig Dispense Refill  . anagrelide (AGRYLIN) 0.5 MG capsule TAKE ONE CAPSULE TWICE A DAY MON/WEDS/FRI AND TAKE ONE CAPSULE EVERY DAY ALL OTHER DAYS 120 capsule 0  . anagrelide (AGRYLIN) 1 MG capsule Take 1 capsule (1 mg total) by mouth daily. Take 1 capsule (1 mg total) by mouth daily except 2 days a week do not take. 30 capsule 2  . Ascorbic Acid (VITAMIN C) 1000 MG tablet Take 1,000 mg by mouth daily.    Marland Kitchen aspirin 325 MG EC tablet Take 325 mg by mouth daily after breakfast.      . Biotin 10000 MCG TABS Take 10,000 mcg by mouth daily.    . carvedilol (COREG) 25 MG tablet Take 1 tablet (25 mg total) by mouth 2 (two) times daily. 180 tablet 3  . Cholecalciferol (VITAMIN D3) 2000 units TABS Take 2,000 Units by mouth daily.    . furosemide (LASIX) 20 MG tablet TAKE 1-2 TABLETS (20-40 MG TOTAL) BY MOUTH DAILY AS NEEDED. 30 tablet 3  . gabapentin (NEURONTIN) 100 MG capsule Take 1 capsule (100 mg total) by mouth 3 (three) times daily. 270 capsule 3  . ibuprofen (ADVIL,MOTRIN) 600 MG tablet TAKE 1 TABLET (600 MG TOTAL) BY MOUTH 2 (TWO) TIMES DAILY AS NEEDED. 180 tablet 0  . losartan-hydrochlorothiazide (HYZAAR) 100-25 MG tablet TAKE 1 TABLET BY MOUTH DAILY. 90 tablet 3  . Multiple Vitamins-Minerals (ADULT GUMMY PO) Take 2 tablets by mouth daily.    . potassium chloride (K-DUR) 10 MEQ tablet TAKE 1 TABLET BY MOUTH ONCE DAILY 90 tablet 3  . trolamine salicylate (ASPERCREME) 10 % cream Apply 1 application topically 2 (two) times daily as needed for muscle pain.    Marland Kitchen albuterol (PROAIR HFA) 108 (90 Base) MCG/ACT inhaler Inhale 2 puffs into the lungs every 4 (four) hours as needed. For shortness of breath 1 Inhaler 3  . methocarbamol (ROBAXIN) 500 MG  tablet Take 1 tablet (500 mg total) by mouth every 6 (six) hours as needed for muscle spasms. 40 tablet 3   No facility-administered medications prior to visit.     ROS: Review of Systems  Constitutional: Negative for activity change, appetite change, chills, fatigue and unexpected weight change.  HENT: Negative for congestion, mouth sores and sinus pressure.   Eyes: Negative for visual disturbance.  Respiratory: Negative for cough and chest tightness.   Gastrointestinal: Negative for abdominal pain and nausea.  Genitourinary: Negative for difficulty urinating, frequency and vaginal pain.  Musculoskeletal: Positive for arthralgias and back pain. Negative for gait problem.  Skin: Negative for pallor and rash.  Neurological: Negative for dizziness, tremors, weakness, numbness and headaches.  Psychiatric/Behavioral: Negative for confusion, sleep disturbance and suicidal ideas.    Objective:  BP (!) 164/76 (BP Location: Left Arm, Patient Position: Sitting, Cuff Size: Normal)   Pulse 67   Temp 98.1 F (36.7 C) (Oral)   Ht 5\' 2"  (1.575 m)   Wt 144 lb (65.3 kg)   SpO2 98%   BMI 26.34 kg/m   BP Readings from Last 3 Encounters:  05/23/18 (!) 164/76  04/05/18 (!) 178/80  03/01/18 (!) 190/80    Wt Readings from Last 3 Encounters:  05/23/18 144 lb (  65.3 kg)  04/05/18 147 lb (66.7 kg)  03/01/18 141 lb (64 kg)    Physical Exam  Constitutional: She appears well-developed. No distress.  HENT:  Head: Normocephalic.  Right Ear: External ear normal.  Left Ear: External ear normal.  Nose: Nose normal.  Mouth/Throat: Oropharynx is clear and moist.  Eyes: Pupils are equal, round, and reactive to light. Conjunctivae are normal. Right eye exhibits no discharge. Left eye exhibits no discharge.  Neck: Normal range of motion. Neck supple. No JVD present. No tracheal deviation present. No thyromegaly present.  Cardiovascular: Normal rate, regular rhythm and normal heart sounds.    Pulmonary/Chest: No stridor. No respiratory distress. She has no wheezes.  Abdominal: Soft. Bowel sounds are normal. She exhibits no distension and no mass. There is no tenderness. There is no rebound and no guarding.  Musculoskeletal: She exhibits tenderness. She exhibits no edema.  Lymphadenopathy:    She has no cervical adenopathy.  Neurological: She displays normal reflexes. No cranial nerve deficit. She exhibits normal muscle tone. Coordination normal.  Skin: No rash noted. No erythema.  Psychiatric: She has a normal mood and affect. Her behavior is normal. Judgment and thought content normal.    Lab Results  Component Value Date   WBC 5.3 03/01/2018   HGB 10.5 (L) 03/01/2018   HCT 32.1 (L) 03/01/2018   PLT 312.0 03/01/2018   GLUCOSE 110 (H) 03/01/2018   CHOL 158 03/01/2018   TRIG 84.0 03/01/2018   HDL 47.10 03/01/2018   LDLCALC 94 03/01/2018   ALT 5 03/01/2018   AST 18 03/01/2018   NA 142 03/01/2018   K 3.3 (L) 03/01/2018   CL 104 03/01/2018   CREATININE 0.85 03/01/2018   BUN 15 03/01/2018   CO2 26 03/01/2018   TSH 1.29 03/01/2018   INR 0.97 12/29/2017   HGBA1C 6.8 (H) 08/26/2017    Mm 3d Screen Breast Bilateral  Result Date: 04/08/2018 CLINICAL DATA:  Screening. EXAM: DIGITAL SCREENING BILATERAL MAMMOGRAM WITH TOMO AND CAD COMPARISON:  Previous exam(s). ACR Breast Density Category b: There are scattered areas of fibroglandular density. FINDINGS: There are no findings suspicious for malignancy. Images were processed with CAD. IMPRESSION: No mammographic evidence of malignancy. A result letter of this screening mammogram will be mailed directly to the patient. RECOMMENDATION: Screening mammogram in one year. (Code:SM-B-01Y) BI-RADS CATEGORY  1: Negative. Electronically Signed   By: Lajean Manes M.D.   On: 04/08/2018 11:49    Assessment & Plan:   There are no diagnoses linked to this encounter.   Meds ordered this encounter  Medications  . albuterol (PROAIR HFA)  108 (90 Base) MCG/ACT inhaler    Sig: Inhale 2 puffs into the lungs every 4 (four) hours as needed. For shortness of breath    Dispense:  1 Inhaler    Refill:  3  . methocarbamol (ROBAXIN) 500 MG tablet    Sig: Take 1 tablet (500 mg total) by mouth every 6 (six) hours as needed for muscle spasms.    Dispense:  40 tablet    Refill:  3  . mometasone-formoterol (DULERA) 100-5 MCG/ACT AERO    Sig: Inhale 2 puffs into the lungs daily as needed for wheezing or shortness of breath.    Dispense:  1 Inhaler    Refill:  11     Follow-up: No follow-ups on file.  [name], MD

## 2018-05-23 NOTE — Assessment & Plan Note (Addendum)
Losartan HCT, Amlodipine cardiac CT scan for calcium scoring offered BP is OK at home

## 2018-05-24 DIAGNOSIS — I10 Essential (primary) hypertension: Secondary | ICD-10-CM | POA: Diagnosis not present

## 2018-05-24 DIAGNOSIS — M4316 Spondylolisthesis, lumbar region: Secondary | ICD-10-CM | POA: Diagnosis not present

## 2018-05-25 ENCOUNTER — Inpatient Hospital Stay: Payer: Medicare Other | Attending: Hematology

## 2018-05-25 DIAGNOSIS — D471 Chronic myeloproliferative disease: Secondary | ICD-10-CM

## 2018-05-25 LAB — CBC WITH DIFFERENTIAL/PLATELET
Abs Immature Granulocytes: 0.01 10*3/uL (ref 0.00–0.07)
BASOS ABS: 0.1 10*3/uL (ref 0.0–0.1)
Basophils Relative: 1 %
EOS PCT: 2 %
Eosinophils Absolute: 0.1 10*3/uL (ref 0.0–0.5)
HCT: 29.6 % — ABNORMAL LOW (ref 36.0–46.0)
HEMOGLOBIN: 10.2 g/dL — AB (ref 12.0–15.0)
IMMATURE GRANULOCYTES: 0 %
LYMPHS PCT: 32 %
Lymphs Abs: 1.6 10*3/uL (ref 0.7–4.0)
MCH: 29.5 pg (ref 26.0–34.0)
MCHC: 34.5 g/dL (ref 30.0–36.0)
MCV: 85.5 fL (ref 80.0–100.0)
Monocytes Absolute: 0.6 10*3/uL (ref 0.1–1.0)
Monocytes Relative: 12 %
NEUTROS ABS: 2.6 10*3/uL (ref 1.7–7.7)
NEUTROS PCT: 53 %
NRBC: 0 % (ref 0.0–0.2)
Platelets: 460 10*3/uL — ABNORMAL HIGH (ref 150–400)
RBC: 3.46 MIL/uL — AB (ref 3.87–5.11)
RDW: 13.2 % (ref 11.5–15.5)
WBC: 4.8 10*3/uL (ref 4.0–10.5)

## 2018-05-25 LAB — CMP (CANCER CENTER ONLY)
ALT: <6 U/L (ref 0–44)
ANION GAP: 12 (ref 5–15)
AST: 19 U/L (ref 15–41)
Albumin: 4 g/dL (ref 3.5–5.0)
Alkaline Phosphatase: 67 U/L (ref 38–126)
BUN: 10 mg/dL (ref 8–23)
CO2: 28 mmol/L (ref 22–32)
CREATININE: 0.91 mg/dL (ref 0.44–1.00)
Calcium: 9.5 mg/dL (ref 8.9–10.3)
Chloride: 92 mmol/L — ABNORMAL LOW (ref 98–111)
Glucose, Bld: 111 mg/dL — ABNORMAL HIGH (ref 70–99)
POTASSIUM: 3.7 mmol/L (ref 3.5–5.1)
SODIUM: 132 mmol/L — AB (ref 135–145)
Total Bilirubin: 0.5 mg/dL (ref 0.3–1.2)
Total Protein: 7.2 g/dL (ref 6.5–8.1)

## 2018-05-27 ENCOUNTER — Telehealth: Payer: Self-pay

## 2018-05-27 NOTE — Telephone Encounter (Signed)
Spoke with patient per Dr. Burr Medico regarding lab results.  Informed patient platelets are mildly elevated, we will continue to monitor this as scheduled.  Patient verbalized an understanding.

## 2018-05-27 NOTE — Telephone Encounter (Signed)
-----   Message from Truitt Merle, MD sent at 05/27/2018 12:36 AM EST ----- Let pt know her lab results, plt mildly elevated, continue monitoring as scheduled, thanks   Truitt Merle  05/27/2018

## 2018-07-07 ENCOUNTER — Other Ambulatory Visit: Payer: Self-pay | Admitting: Internal Medicine

## 2018-08-22 NOTE — Progress Notes (Signed)
[place]   [phone] [phone]   Clinic Follow up Note   Patient Care Team: [name], [name], MD as PCP - General Angelia Mould, MD (Vascular Surgery) Ara Kussmaul, MD (Ophthalmology) Nahser, Wonda Cheng, MD as Consulting Physician (Cardiology) Truitt Merle, MD as Consulting Physician (Hematology) Eustace Moore, MD as Consulting Physician (Neurosurgery)  Date of Service:  08/24/2018  CHIEF COMPLAINT: F/u of MPN   PREVIOUS AND CURRENT THERAPY:  1.Hydrea 500 mg  Every day except 1,000 mg on M and Thurs. Decreased to 500mg  daily on 06/10/2015, and further decreased to 500mg  daily except M and Thursday due to anemia, stopped on 07/30/2015 2. Added anagrelide 0.5 mg twice daily on 06/10/2015, increased to 1mg  am 0.5mg  pm on 07/30/2015, and decreased to 0.5mg  daily on 09/03/2015, changed to anagrelide 0.5 mg twice daily on MWF, and 0.5mg  daily on the rest of week in 03/2016. Currently on 1mg  anagrelide daily for 5 days of the week.   3. Aspirin 325 mg daily.   INTERVAL HISTORY:  Paula Griffith is here for a follow up. She was last sen by me 6 months ago. She presets to the clinic today by herself. She notes she is doing well. She notes she denies any new changes.  She is on anagrelide 1mg  5 days a week. She thinks she may need a refill next week.   For her back surgery and neck surgery she was given oxycodone but did not like the way it made her feel. She notes she still has moderate back pain. She will use heating pad and ibuprofen for this. She denies chest discomfort but she has been referred to cardiologist by her orthopedic surgeon for more follow up.  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    REVIEW OF SYSTEMS:   Constitutional: Denies fevers, chills or abnormal weight loss Eyes: Denies blurriness of vision Ears, nose, mouth, throat, and face: Denies mucositis or sore throat Respiratory: Denies cough,  dyspnea or wheezes Cardiovascular: Denies palpitation, chest discomfort or lower extremity swelling Gastrointestinal:  Denies nausea, heartburn or change in bowel habits Skin: Denies abnormal skin rashes MSK: (+) Moderate back pain  Lymphatics: Denies new lymphadenopathy or easy bruising Neurological:Denies numbness, tingling or new weaknesses Behavioral/Psych: Mood is stable, no new changes  All other systems were reviewed with the patient and are negative.  MEDICAL HISTORY:  Past Medical History:  Diagnosis Date  . Allergic rhinitis   . Asthma   . Cervicalgia   . Colon polyps   . Complication of anesthesia   . Diverticulosis of colon   . Glucose intolerance (impaired glucose tolerance)   . HTN (hypertension)   . LBP (low back pain)   . Leukemia ([place])   . Osteoarthritis   . PONV (postoperative nausea and vomiting)   . PVD (peripheral vascular disease) ([place]) 2011   Right Carotid Dr Scot Dock  . 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 Spondylolisthesis of lumbar region   . Thrombocytopathy [facility]) 2011   Dr Ralene Ok    SURGICAL HISTORY: Past Surgical History:  Procedure Laterality Date  . ABDOMINAL HYSTERECTOMY  1989  . ANTERIOR CERVICAL DECOMP/DISCECTOMY FUSION N/A 09/22/2017   Procedure: ANTERIOR CERVICAL DECOMPRESSION AND FUSION CERVICAL THREE-FOUR.;  Surgeon: Eustace Moore, MD;  Location: [place];  Service: Neurosurgery;  Laterality: N/A;  anterior  . BACK SURGERY    . BREAST EXCISIONAL BIOPSY Left   . BREAST SURGERY  Left  . LUMBAR LAMINECTOMY/DECOMPRESSION MICRODISCECTOMY Left 01/06/2018   Procedure: laminectomy Lumbar five -Sacral one - left, Lumbar two-Lumbar three - bilateral;  Surgeon: Eustace Moore, MD;  Location: [place];  Service: Neurosurgery;  Laterality: Left;    I have reviewed the social history and family history with the patient and they are unchanged from previous note.  ALLERGIES:  is allergic to benazepril; amlodipine; other; codeine; and  tramadol.  MEDICATIONS:  Current Outpatient Medications  Medication Sig Dispense Refill  . albuterol (PROAIR HFA) 108 (90 Base) MCG/ACT inhaler Inhale 2 puffs into the lungs every 4 (four) hours as needed. For shortness of breath 1 Inhaler 3  . anagrelide (AGRYLIN) 1 MG capsule Take 1 capsule (1 mg total) by mouth daily. Take 1 capsule (1 mg total) by mouth daily except 2 days a week do not take. 30 capsule 2  . Ascorbic Acid (VITAMIN C) 1000 MG tablet Take 1,000 mg by mouth daily.    Marland Kitchen aspirin 325 MG EC tablet Take 325 mg by mouth daily after breakfast.      . Biotin 10000 MCG TABS Take 10,000 mcg by mouth daily.    . carvedilol (COREG) 25 MG tablet Take 1 tablet (25 mg total) by mouth 2 (two) times daily. 180 tablet 3  . Cholecalciferol (VITAMIN D3) 2000 units TABS Take 2,000 Units by mouth daily.    . Fluticasone-Salmeterol (ADVAIR) 250-50 MCG/DOSE AEPB Please specify directions, refills and quantity 1 each 5  . furosemide (LASIX) 20 MG tablet TAKE 1-2 TABLETS (20-40 MG TOTAL) BY MOUTH DAILY AS NEEDED. 30 tablet 3  . gabapentin (NEURONTIN) 100 MG capsule Take 1 capsule (100 mg total) by mouth 3 (three) times daily. 270 capsule 3  . ibuprofen (ADVIL,MOTRIN) 600 MG tablet TAKE 1 TABLET (600 MG TOTAL) BY MOUTH 2 (TWO) TIMES DAILY AS NEEDED. 180 tablet 0  . losartan-hydrochlorothiazide (HYZAAR) 100-25 MG tablet TAKE 1 TABLET BY MOUTH DAILY. 90 tablet 3  . methocarbamol (ROBAXIN) 500 MG tablet Take 1 tablet (500 mg total) by mouth every 6 (six) hours as needed for muscle spasms. 40 tablet 3  . Multiple Vitamins-Minerals (ADULT GUMMY PO) Take 2 tablets by mouth daily.    . potassium chloride (K-DUR) 10 MEQ tablet TAKE 1 TABLET BY MOUTH ONCE DAILY 90 tablet 3  . trolamine salicylate (ASPERCREME) 10 % cream Apply 1 application topically 2 (two) times daily as needed for muscle pain.     No current facility-administered medications for this visit.     PHYSICAL EXAMINATION: ECOG PERFORMANCE  STATUS: 0 - Asymptomatic  Vitals:   08/24/18 1259  BP: (!) 145/66  Pulse: (!) 57  Resp: 17  Temp: 98.2 F (36.8 C)  SpO2: 100%   Filed Weights   08/24/18 1259  Weight: 145 lb 8 oz (66 kg)    GENERAL:alert, no distress and comfortable SKIN: skin color, texture, turgor are normal, no rashes or significant lesions EYES: normal, Conjunctiva are pink and non-injected, sclera clear OROPHARYNX:no exudate, no erythema and lips, buccal mucosa, and tongue normal  NECK: supple, thyroid normal size, non-tender, without nodularity LYMPH:  no palpable lymphadenopathy in the cervical, axillary or inguinal LUNGS: clear to auscultation and percussion with normal breathing effort HEART: regular rate & rhythm and no murmurs and no lower extremity edema ABDOMEN:abdomen soft, non-tender and normal bowel sounds Musculoskeletal:no cyanosis of digits and no clubbing  NEURO: alert & oriented x 3 with fluent speech, no focal motor/sensory deficits  LABORATORY DATA:  I  have reviewed the data as listed CBC Latest Ref Rng & Units 08/24/2018 05/25/2018 03/01/2018  WBC 4.0 - 10.5 K/uL 4.6 4.8 5.3  Hemoglobin 12.0 - 15.0 g/dL 9.5(L) 10.2(L) 10.5(L)  Hematocrit 36.0 - 46.0 % 27.2(L) 29.6(L) 32.1(L)  Platelets 150 - 400 K/uL 303 460(H) 312.0     CMP Latest Ref Rng & Units 08/24/2018 05/25/2018 03/01/2018  Glucose 70 - 99 mg/dL 121(H) 111(H) 110(H)  BUN 8 - 23 mg/dL 10 10 15   Creatinine 0.44 - 1.00 mg/dL 0.99 0.91 0.85  Sodium 135 - 145 mmol/L 132(L) 132(L) 142  Potassium 3.5 - 5.1 mmol/L 3.6 3.7 3.3(L)  Chloride 98 - 111 mmol/L 95(L) 92(L) 104  CO2 22 - 32 mmol/L 27 28 26   Calcium 8.9 - 10.3 mg/dL 9.3 9.5 9.8  Total Protein 6.5 - 8.1 g/dL 7.1 7.2 7.5  Total Bilirubin 0.3 - 1.2 mg/dL 0.4 0.5 0.4  Alkaline Phos 38 - 126 U/L 57 67 74  AST 15 - 41 U/L 16 19 18   ALT 0 - 44 U/L 6 <6 5      RADIOGRAPHIC STUDIES: I have personally reviewed the radiological images as listed and agreed with the findings in  the report. No results found.   ASSESSMENT & PLAN:  ELEANOR GATLIFF is a 73 y.o. female with   1. Myeloproliferative neoplasm-essential thrombocythemia, JAK2 mutation negative.  -We previously reviewed the natural history of ET, most patients do well, major complication is thrombosis. -She was on Hydrea before starting in 05/2015, I stopped in 06/2015 due to her anemia. -she was switched to anagrelide for disease control in 05/2015.  -Her anagrelide dose has been titrated as needed. We will monitor her EKG periodically. -Anemia has since improved. -Labs reviewed, hemoglobin 9.5, platelet 303K, will continue current dose anagrelide. She is clinically doing well.  -continue anagrelide 1 mg daily for 5 days a week. -f/u in 6 months, with lab in 3 months   2. Arthritis and back pain   -She will continue follow-up with her PCP and pain management clinic. -Back pain is stable and moderate, she has no limitation on her activities   3. Hypertension and other medical problems -She'll continue follow-up with her primary care physician  4. Anemia  -secondary to MPN and previous hydrea  -continue monitoring, Hg 9.5 today, slightly worse  Plan -Lab reviewed, continue anagrelide 1 mg daily for 5 days a week -Lab in 3 months, lab and follow-up in 6 months   No problem-specific Assessment & Plan notes found for this encounter.   No orders of the defined types were placed in this encounter.  All questions were answered. The patient knows to call the clinic with any problems, questions or concerns. No barriers to learning was detected. I spent 10 minutes counseling the patient face to face. The total time spent in the appointment was 15 minutes and more than 50% was on counseling and review of test results     Truitt Merle, MD 08/24/2018   I, Joslyn Devon, am acting as scribe for Truitt Merle, MD.   I have reviewed the above documentation for accuracy and completeness, and I agree with the  above.

## 2018-08-24 ENCOUNTER — Inpatient Hospital Stay (HOSPITAL_BASED_OUTPATIENT_CLINIC_OR_DEPARTMENT_OTHER): Payer: Medicare Other | Admitting: Hematology

## 2018-08-24 ENCOUNTER — Telehealth: Payer: Self-pay | Admitting: Hematology

## 2018-08-24 ENCOUNTER — Encounter: Payer: Self-pay | Admitting: Hematology

## 2018-08-24 ENCOUNTER — Inpatient Hospital Stay: Payer: Medicare Other | Attending: Hematology

## 2018-08-24 VITALS — BP 145/66 | HR 57 | Temp 98.2°F | Resp 17 | Ht 62.0 in | Wt 145.5 lb

## 2018-08-24 DIAGNOSIS — Z79899 Other long term (current) drug therapy: Secondary | ICD-10-CM | POA: Insufficient documentation

## 2018-08-24 DIAGNOSIS — D649 Anemia, unspecified: Secondary | ICD-10-CM

## 2018-08-24 DIAGNOSIS — Z7982 Long term (current) use of aspirin: Secondary | ICD-10-CM | POA: Insufficient documentation

## 2018-08-24 DIAGNOSIS — I10 Essential (primary) hypertension: Secondary | ICD-10-CM

## 2018-08-24 DIAGNOSIS — D473 Essential (hemorrhagic) thrombocythemia: Secondary | ICD-10-CM

## 2018-08-24 DIAGNOSIS — D471 Chronic myeloproliferative disease: Secondary | ICD-10-CM

## 2018-08-24 DIAGNOSIS — Z791 Long term (current) use of non-steroidal anti-inflammatories (NSAID): Secondary | ICD-10-CM | POA: Insufficient documentation

## 2018-08-24 LAB — CBC WITH DIFFERENTIAL/PLATELET
Abs Immature Granulocytes: 0 10*3/uL (ref 0.00–0.07)
BASOS ABS: 0 10*3/uL (ref 0.0–0.1)
Basophils Relative: 1 %
EOS ABS: 0.1 10*3/uL (ref 0.0–0.5)
EOS PCT: 1 %
HCT: 27.2 % — ABNORMAL LOW (ref 36.0–46.0)
Hemoglobin: 9.5 g/dL — ABNORMAL LOW (ref 12.0–15.0)
Immature Granulocytes: 0 %
Lymphocytes Relative: 28 %
Lymphs Abs: 1.3 10*3/uL (ref 0.7–4.0)
MCH: 31.7 pg (ref 26.0–34.0)
MCHC: 34.9 g/dL (ref 30.0–36.0)
MCV: 90.7 fL (ref 80.0–100.0)
MONO ABS: 0.5 10*3/uL (ref 0.1–1.0)
Monocytes Relative: 11 %
NRBC: 0 % (ref 0.0–0.2)
Neutro Abs: 2.7 10*3/uL (ref 1.7–7.7)
Neutrophils Relative %: 59 %
Platelets: 303 10*3/uL (ref 150–400)
RBC: 3 MIL/uL — AB (ref 3.87–5.11)
RDW: 11.5 % (ref 11.5–15.5)
WBC: 4.6 10*3/uL (ref 4.0–10.5)

## 2018-08-24 LAB — CMP (CANCER CENTER ONLY)
ALK PHOS: 57 U/L (ref 38–126)
ALT: 6 U/L (ref 0–44)
AST: 16 U/L (ref 15–41)
Albumin: 4 g/dL (ref 3.5–5.0)
Anion gap: 10 (ref 5–15)
BILIRUBIN TOTAL: 0.4 mg/dL (ref 0.3–1.2)
BUN: 10 mg/dL (ref 8–23)
CALCIUM: 9.3 mg/dL (ref 8.9–10.3)
CHLORIDE: 95 mmol/L — AB (ref 98–111)
CO2: 27 mmol/L (ref 22–32)
CREATININE: 0.99 mg/dL (ref 0.44–1.00)
GFR, Estimated: 57 mL/min — ABNORMAL LOW (ref >60)
Glucose, Bld: 121 mg/dL — ABNORMAL HIGH (ref 70–99)
Potassium: 3.6 mmol/L (ref 3.5–5.1)
Sodium: 132 mmol/L — ABNORMAL LOW (ref 135–145)
TOTAL PROTEIN: 7.1 g/dL (ref 6.5–8.1)

## 2018-08-24 MED ORDER — ANAGRELIDE HCL 1 MG PO CAPS: 1.0000 mg | ORAL_CAPSULE | Freq: Every day | ORAL | 2 refills | Status: DC

## 2018-08-24 NOTE — Telephone Encounter (Signed)
Scheduled appt per 02/26 los. ° °Printed calendar and avs. °

## 2018-08-26 ENCOUNTER — Telehealth: Payer: Self-pay

## 2018-08-26 NOTE — Telephone Encounter (Signed)
Spoke with patient regarding recent lab results, per Dr. Burr Medico anemia a little worse, otherwise labs were within normal limits, instructed she can try take an OTC iron supplement once daily, watch for constipation, patient verbalized an understanding. Watch for abnormal bleeding, black tarry stools.

## 2018-09-02 ENCOUNTER — Other Ambulatory Visit: Payer: Self-pay | Admitting: Internal Medicine

## 2018-09-09 ENCOUNTER — Other Ambulatory Visit: Payer: Self-pay | Admitting: Internal Medicine

## 2018-09-19 ENCOUNTER — Telehealth: Payer: Self-pay | Admitting: Internal Medicine

## 2018-09-19 ENCOUNTER — Other Ambulatory Visit: Payer: Self-pay | Admitting: Internal Medicine

## 2018-09-19 MED ORDER — IBUPROFEN 600 MG PO TABS: 600.0000 mg | ORAL_TABLET | Freq: Two times a day (BID) | ORAL | 0 refills | Status: DC | PRN

## 2018-09-19 NOTE — Telephone Encounter (Signed)
Copied from [place] [id number]. Topic: Quick Communication - Rx Refill/Question >> Sep 19, 2018  8:21 AM Burchel, Abbi R wrote: Medication: ibuprofen (ADVIL,MOTRIN) 600 MG tablet   Preferred Pharmacy:[id number] [name], [place] - [address] [phone] (Phone) [phone] (Fax)   Pt was advised that RX refills may take up to 3 business days. We ask that you follow-up with your pharmacy.

## 2018-09-19 NOTE — Telephone Encounter (Signed)
RX sent

## 2018-09-21 ENCOUNTER — Ambulatory Visit: Payer: Medicare Other | Admitting: Internal Medicine

## 2018-10-06 ENCOUNTER — Other Ambulatory Visit: Payer: Self-pay | Admitting: Internal Medicine

## 2018-10-06 ENCOUNTER — Other Ambulatory Visit: Payer: Self-pay | Admitting: Hematology

## 2018-10-06 DIAGNOSIS — D471 Chronic myeloproliferative disease: Secondary | ICD-10-CM

## 2018-10-11 ENCOUNTER — Telehealth: Payer: Self-pay | Admitting: Internal Medicine

## 2018-10-13 NOTE — Telephone Encounter (Signed)
Error

## 2018-10-31 ENCOUNTER — Telehealth: Payer: Self-pay

## 2018-10-31 NOTE — Telephone Encounter (Signed)
I called her back. She has been having leg and hand edema for the past one month, on fluid pill by her PCP and was referred to cardiology. She has been on Anagrelide since 2017, I am not sure if her edema is related to Anagrelide. I recommend her to f/u with cardiology, and OK to hold Anagrelide if edema gets worse. She does not want to hold anagrelide for now, and knows to call if needed. If her symptoms persists, I will see her when she returns for lab next time. She agrees with the plan and will call me if needed.    Truitt Merle MD  10/31/2018

## 2018-10-31 NOTE — Telephone Encounter (Signed)
Patient calls stating received a call from her pharmacy stating that the Anagrelide is what is making her feet and legs swell.  They instructed her to call our office to inquire about getting another type of medication in its place.  [phone]  Message routed to Dr. Burr Medico and Cira Rue NP

## 2018-11-22 ENCOUNTER — Other Ambulatory Visit: Payer: Self-pay

## 2018-11-22 ENCOUNTER — Inpatient Hospital Stay: Payer: Medicare Other | Attending: Hematology

## 2018-11-22 DIAGNOSIS — D471 Chronic myeloproliferative disease: Secondary | ICD-10-CM

## 2018-11-22 DIAGNOSIS — D473 Essential (hemorrhagic) thrombocythemia: Secondary | ICD-10-CM | POA: Diagnosis not present

## 2018-11-22 LAB — CMP (CANCER CENTER ONLY)
ALT: <6 U/L (ref 0–44)
AST: 17 U/L (ref 15–41)
Albumin: 4.1 g/dL (ref 3.5–5.0)
Alkaline Phosphatase: 52 U/L (ref 38–126)
Anion gap: 10 (ref 5–15)
BUN: 21 mg/dL (ref 8–23)
CO2: 25 mmol/L (ref 22–32)
Calcium: 9.5 mg/dL (ref 8.9–10.3)
Chloride: 99 mmol/L (ref 98–111)
Creatinine: 1.05 mg/dL — ABNORMAL HIGH (ref 0.44–1.00)
GFR, Est AFR Am: >60 mL/min (ref >60)
GFR, Estimated: 53 mL/min — ABNORMAL LOW (ref >60)
Glucose, Bld: 118 mg/dL — ABNORMAL HIGH (ref 70–99)
Potassium: 4.2 mmol/L (ref 3.5–5.1)
Sodium: 134 mmol/L — ABNORMAL LOW (ref 135–145)
Total Bilirubin: 0.4 mg/dL (ref 0.3–1.2)
Total Protein: 7.3 g/dL (ref 6.5–8.1)

## 2018-11-22 LAB — CBC WITH DIFFERENTIAL/PLATELET
Abs Immature Granulocytes: 0.01 10*3/uL (ref 0.00–0.07)
Basophils Absolute: 0.1 10*3/uL (ref 0.0–0.1)
Basophils Relative: 1 %
Eosinophils Absolute: 0.1 10*3/uL (ref 0.0–0.5)
Eosinophils Relative: 2 %
HCT: 28.8 % — ABNORMAL LOW (ref 36.0–46.0)
Hemoglobin: 9.8 g/dL — ABNORMAL LOW (ref 12.0–15.0)
Immature Granulocytes: 0 %
Lymphocytes Relative: 24 %
Lymphs Abs: 1.3 10*3/uL (ref 0.7–4.0)
MCH: 31.8 pg (ref 26.0–34.0)
MCHC: 34 g/dL (ref 30.0–36.0)
MCV: 93.5 fL (ref 80.0–100.0)
Monocytes Absolute: 0.5 10*3/uL (ref 0.1–1.0)
Monocytes Relative: 9 %
Neutro Abs: 3.3 10*3/uL (ref 1.7–7.7)
Neutrophils Relative %: 64 %
Platelets: 359 10*3/uL (ref 150–400)
RBC: 3.08 MIL/uL — ABNORMAL LOW (ref 3.87–5.11)
RDW: 11.4 % — ABNORMAL LOW (ref 11.5–15.5)
WBC: 5.2 10*3/uL (ref 4.0–10.5)
nRBC: 0 % (ref 0.0–0.2)

## 2018-11-30 ENCOUNTER — Ambulatory Visit (INDEPENDENT_AMBULATORY_CARE_PROVIDER_SITE_OTHER): Payer: Medicare Other | Admitting: Internal Medicine

## 2018-11-30 ENCOUNTER — Encounter: Payer: Self-pay | Admitting: Internal Medicine

## 2018-11-30 ENCOUNTER — Other Ambulatory Visit: Payer: Self-pay

## 2018-11-30 VITALS — BP 130/60 | HR 70 | Temp 98.8°F | Ht 62.0 in | Wt 145.0 lb

## 2018-11-30 DIAGNOSIS — J4531 Mild persistent asthma with (acute) exacerbation: Secondary | ICD-10-CM | POA: Diagnosis not present

## 2018-11-30 DIAGNOSIS — R269 Unspecified abnormalities of gait and mobility: Secondary | ICD-10-CM | POA: Diagnosis not present

## 2018-11-30 DIAGNOSIS — R739 Hyperglycemia, unspecified: Secondary | ICD-10-CM

## 2018-11-30 DIAGNOSIS — I10 Essential (primary) hypertension: Secondary | ICD-10-CM

## 2018-11-30 DIAGNOSIS — D471 Chronic myeloproliferative disease: Secondary | ICD-10-CM

## 2018-11-30 NOTE — Assessment & Plan Note (Signed)
Cane

## 2018-11-30 NOTE — Assessment & Plan Note (Signed)
Symbicort 

## 2018-11-30 NOTE — Assessment & Plan Note (Signed)
Agrylin 2020

## 2018-11-30 NOTE — Assessment & Plan Note (Signed)
Losartan HCT, Amlodipine

## 2018-11-30 NOTE — Progress Notes (Signed)
Subjective:  Patient ID: Paula Griffith, female    DOB: 05/23/1946  Age: 73 y.o. MRN: [id number]  CC: No chief complaint on file.   HPI Paula Griffith presents for hypertension, asthma, arthritis follow-up  Outpatient Medications Prior to Visit  Medication Sig Dispense Refill  . albuterol (PROAIR HFA) 108 (90 Base) MCG/ACT inhaler Inhale 2 puffs into the lungs every 4 (four) hours as needed. For shortness of breath 1 Inhaler 3  . amLODipine (NORVASC) 5 MG tablet TAKE 1 TABLET BY MOUTH EVERY DAY 90 tablet 3  . anagrelide (AGRYLIN) 1 MG capsule TAKE 1 CAPSULE BY MOUTH EVERY DAY , EXCEPT 2 DAYS A WEEK DO NOT TAKE 90 capsule 0  . Ascorbic Acid (VITAMIN C) 1000 MG tablet Take 1,000 mg by mouth daily.    Marland Kitchen aspirin 325 MG EC tablet Take 325 mg by mouth daily after breakfast.      . Biotin 10000 MCG TABS Take 10,000 mcg by mouth daily.    . carvedilol (COREG) 25 MG tablet Take 1 tablet (25 mg total) by mouth 2 (two) times daily. 180 tablet 3  . Cholecalciferol (VITAMIN D3) 2000 units TABS Take 2,000 Units by mouth daily.    . Fluticasone-Salmeterol (ADVAIR) 250-50 MCG/DOSE AEPB Please specify directions, refills and quantity 1 each 5  . furosemide (LASIX) 20 MG tablet TAKE 1-2 TABLETS (20-40 MG TOTAL) BY MOUTH DAILY AS NEEDED. 30 tablet 5  . gabapentin (NEURONTIN) 100 MG capsule Take 1 capsule (100 mg total) by mouth 3 (three) times daily. 270 capsule 3  . ibuprofen (ADVIL,MOTRIN) 600 MG tablet Take 1 tablet (600 mg total) by mouth 2 (two) times daily as needed. 180 tablet 0  . losartan-hydrochlorothiazide (HYZAAR) 100-25 MG tablet TAKE 1 TABLET BY MOUTH DAILY. 90 tablet 3  . methocarbamol (ROBAXIN) 500 MG tablet TAKE 1 TABLET (500 MG TOTAL) BY MOUTH EVERY 6 (SIX) HOURS AS NEEDED FOR MUSCLE SPASMS. 40 tablet 3  . Multiple Vitamins-Minerals (ADULT GUMMY PO) Take 2 tablets by mouth daily.    . potassium chloride (K-DUR) 10 MEQ tablet TAKE 1 TABLET BY MOUTH ONCE DAILY 90 tablet 3  . trolamine  salicylate (ASPERCREME) 10 % cream Apply 1 application topically 2 (two) times daily as needed for muscle pain.     No facility-administered medications prior to visit.     ROS: Review of Systems  Constitutional: Positive for fatigue. Negative for activity change, appetite change, chills and unexpected weight change.  HENT: Negative for congestion, mouth sores and sinus pressure.   Eyes: Negative for visual disturbance.  Respiratory: Negative for cough and chest tightness.   Gastrointestinal: Negative for abdominal pain and nausea.  Genitourinary: Negative for difficulty urinating, frequency and vaginal pain.  Musculoskeletal: Positive for arthralgias and gait problem. Negative for back pain.  Skin: Negative for pallor and rash.  Neurological: Negative for dizziness, tremors, weakness, numbness and headaches.  Psychiatric/Behavioral: Negative for confusion and sleep disturbance.    Objective:  BP 130/60 (BP Location: Left Arm, Patient Position: Sitting, Cuff Size: Normal)   Pulse 70   Temp 98.8 F (37.1 C) (Oral)   Ht 5\' 2"  (1.575 m)   Wt 145 lb 0.6 oz (65.8 kg)   SpO2 98%   BMI 26.53 kg/m   BP Readings from Last 3 Encounters:  11/30/18 130/60  08/24/18 (!) 145/66  05/23/18 (!) 164/76    Wt Readings from Last 3 Encounters:  11/30/18 145 lb 0.6 oz (65.8 kg)  08/24/18 145 lb  8 oz (66 kg)  05/23/18 144 lb (65.3 kg)    Physical Exam Constitutional:      General: She is not in acute distress.    Appearance: She is well-developed.  HENT:     Head: Normocephalic.     Right Ear: External ear normal.     Left Ear: External ear normal.     Nose: Nose normal.  Eyes:     General:        Right eye: No discharge.        Left eye: No discharge.     Conjunctiva/sclera: Conjunctivae normal.     Pupils: Pupils are equal, round, and reactive to light.  Neck:     Musculoskeletal: Normal range of motion and neck supple.     Thyroid: No thyromegaly.     Vascular: No JVD.      Trachea: No tracheal deviation.  Cardiovascular:     Rate and Rhythm: Normal rate and regular rhythm.     Heart sounds: Normal heart sounds.  Pulmonary:     Effort: No respiratory distress.     Breath sounds: No stridor. No wheezing.  Abdominal:     General: Bowel sounds are normal. There is no distension.     Palpations: Abdomen is soft. There is no mass.     Tenderness: There is no abdominal tenderness. There is no guarding or rebound.  Musculoskeletal:        General: Tenderness present.  Lymphadenopathy:     Cervical: No cervical adenopathy.  Skin:    Findings: No erythema or rash.  Neurological:     Cranial Nerves: No cranial nerve deficit.     Motor: No abnormal muscle tone.     Coordination: Coordination abnormal.     Deep Tendon Reflexes: Reflexes normal.  Psychiatric:        Behavior: Behavior normal.        Thought Content: Thought content normal.        Judgment: Judgment normal.    [place]ane   Lab Results  Component Value Date   WBC 5.2 11/22/2018   HGB 9.8 (L) 11/22/2018   HCT 28.8 (L) 11/22/2018   PLT 359 11/22/2018   GLUCOSE 118 (H) 11/22/2018   CHOL 158 03/01/2018   TRIG 84.0 03/01/2018   HDL 47.10 03/01/2018   LDLCALC 94 03/01/2018   ALT <6 11/22/2018   AST 17 11/22/2018   NA 134 (L) 11/22/2018   K 4.2 11/22/2018   CL 99 11/22/2018   CREATININE 1.05 (H) 11/22/2018   BUN 21 11/22/2018   CO2 25 11/22/2018   TSH 1.29 03/01/2018   INR 0.97 12/29/2017   HGBA1C 6.8 (H) 08/26/2017    Mm 3d Screen Breast Bilateral  Result Date: 04/08/2018 CLINICAL DATA:  Screening. EXAM: DIGITAL SCREENING BILATERAL MAMMOGRAM WITH TOMO AND CAD COMPARISON:  Previous exam(s). ACR Breast Density Category b: There are scattered areas of fibroglandular density. FINDINGS: There are no findings suspicious for malignancy. Images were processed with CAD. IMPRESSION: No mammographic evidence of malignancy. A result letter of this screening mammogram will be mailed directly to the  patient. RECOMMENDATION: Screening mammogram in one year. (Code:[id number]-B-01Y) BI-RADS CATEGORY  1: Negative. Electronically Signed   By: Lajean Manes M.D.   On: 04/08/2018 11:49    Assessment & Plan:   Diagnoses and all orders for this visit:  Essential hypertension  Mild persistent asthma with acute exacerbation  Gait disorder  Hyperglycemia  MPN (myeloproliferative neoplasm) ([place])  No orders of the defined types were placed in this encounter.    Follow-up: Return in about 4 months (around 04/01/2019) for a follow-up visit.  [name], MD

## 2018-11-30 NOTE — Assessment & Plan Note (Signed)
Labs

## 2018-12-27 ENCOUNTER — Other Ambulatory Visit: Payer: Self-pay | Admitting: Internal Medicine

## 2019-01-11 ENCOUNTER — Other Ambulatory Visit: Payer: Self-pay | Admitting: Hematology

## 2019-01-11 DIAGNOSIS — D471 Chronic myeloproliferative disease: Secondary | ICD-10-CM

## 2019-02-03 ENCOUNTER — Other Ambulatory Visit: Payer: Self-pay | Admitting: Hematology

## 2019-02-03 DIAGNOSIS — D471 Chronic myeloproliferative disease: Secondary | ICD-10-CM

## 2019-02-17 NOTE — Progress Notes (Signed)
[place]   [phone] [phone]   Clinic Follow up Note   Patient Care Team: [name], [name], MD as PCP - General Angelia Mould, MD (Vascular Surgery) Ara Kussmaul, MD (Ophthalmology) Nahser, Wonda Cheng, MD as Consulting Physician (Cardiology) Truitt Merle, MD as Consulting Physician (Hematology) Eustace Moore, MD as Consulting Physician (Neurosurgery)  Date of Service:  02/22/2019  CHIEF COMPLAINT: F/u of MPN  PREVIOUS AND CURRENT THERAPY:  1.Hydrea 500 mg Every day except 1,000 mg on M and Thurs. Decreased to 500mg  daily on 06/10/2015, and further decreased to 500mg  daily except M and Thursday due to anemia, stopped on 07/30/2015 2. Added anagrelide 0.5 mg twice daily on 06/10/2015, increased to 1mg  am 0.5mg  pm on 07/30/2015, and decreased to 0.5mg  daily on 09/03/2015, changed to anagrelide 0.5 mg twice daily on MWF, and 0.5mg  daily on the rest of week in 03/2016. Currently on 1mg  anagrelide daily for 5 days of the week.  3. Aspirin 325 mg daily.    INTERVAL HISTORY:  Paula Griffith is here for a follow up MPN. She was last seen by me 6 months ago. She presents to the clinic alone. She notes she has a alert button in case of emergencies or falls. She notes she is doing well. She had fallen 1 month ago and hurt her left hand. She uses wrist brace. She and her hand are overall doing better. She takes anagrelide 1mg  daily except Tuesday and Thursday. She notes she has reduced her meat intake and has increased vegetables. She has lost 3 pounds from this. She does have occasional leg swelling so she was given Lasix to take as needed. She notes at her PCP office she had lab incident.    REVIEW OF SYSTEMS:   Constitutional: Denies fevers, chills (+) Purposeful weight loss Eyes: Denies blurriness of vision Ears, nose, mouth, throat, and face: Denies mucositis or sore throat Respiratory: Denies cough, dyspnea or wheezes Cardiovascular:  Denies palpitation, chest discomfort or lower extremity swelling Gastrointestinal:  Denies nausea, heartburn or change in bowel habits Skin: Denies abnormal skin rashes Lymphatics: Denies new lymphadenopathy or easy bruising Neurological:Denies numbness, tingling or new weaknesses Behavioral/Psych: Mood is stable, no new changes  All other systems were reviewed with the patient and are negative.  MEDICAL HISTORY:  Past Medical History:  Diagnosis Date  . Allergic rhinitis   . Asthma   . Cervicalgia   . Colon polyps   . Complication of anesthesia   . Diverticulosis of colon   . Glucose intolerance (impaired glucose tolerance)   . HTN (hypertension)   . LBP (low back pain)   . Leukemia ([place])   . Osteoarthritis   . PONV (postoperative nausea and vomiting)   . PVD (peripheral vascular disease) ([place]) 2011   Right Carotid Dr Scot Dock  . 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 Spondylolisthesis of lumbar region   . Thrombocytopathy [facility]) 2011   Dr Ralene Ok    SURGICAL HISTORY: Past Surgical History:  Procedure Laterality Date  . ABDOMINAL HYSTERECTOMY  1989  . ANTERIOR CERVICAL DECOMP/DISCECTOMY FUSION N/A 09/22/2017   Procedure: ANTERIOR CERVICAL DECOMPRESSION AND FUSION CERVICAL THREE-FOUR.;  Surgeon: Eustace Moore, MD;  Location: [place];  Service: Neurosurgery;  Laterality: N/A;  anterior  . BACK SURGERY    . BREAST EXCISIONAL BIOPSY Left   . BREAST SURGERY     Left  . LUMBAR LAMINECTOMY/DECOMPRESSION MICRODISCECTOMY Left 01/06/2018   Procedure: laminectomy Lumbar five -Sacral  one - left, Lumbar two-Lumbar three - bilateral;  Surgeon: Eustace Moore, MD;  Location: [place];  Service: Neurosurgery;  Laterality: Left;    I have reviewed the social history and family history with the patient and they are unchanged from previous note.  ALLERGIES:  is allergic to benazepril; amlodipine; other; codeine; and tramadol.  MEDICATIONS:  Current Outpatient Medications  Medication Sig  Dispense Refill  . albuterol (PROAIR HFA) 108 (90 Base) MCG/ACT inhaler Inhale 2 puffs into the lungs every 4 (four) hours as needed. For shortness of breath 1 Inhaler 3  . amLODipine (NORVASC) 5 MG tablet TAKE 1 TABLET BY MOUTH EVERY DAY 90 tablet 3  . anagrelide (AGRYLIN) 1 MG capsule TAKE 1 CAPSULE (1 MG TOTAL) BY MOUTH DAILY EXCEPT 2 DAYS A WEEK DO NOT TAKE. 66 capsule 5  . Ascorbic Acid (VITAMIN C) 1000 MG tablet Take 1,000 mg by mouth daily.    Marland Kitchen aspirin 325 MG EC tablet Take 325 mg by mouth daily after breakfast.      . Biotin 10000 MCG TABS Take 10,000 mcg by mouth daily.    . carvedilol (COREG) 25 MG tablet Take 1 tablet (25 mg total) by mouth 2 (two) times daily. 180 tablet 3  . Cholecalciferol (VITAMIN D3) 2000 units TABS Take 2,000 Units by mouth daily.    . Fluticasone-Salmeterol (ADVAIR) 250-50 MCG/DOSE AEPB Please specify directions, refills and quantity 1 each 5  . furosemide (LASIX) 20 MG tablet TAKE 1-2 TABLETS (20-40 MG TOTAL) BY MOUTH DAILY AS NEEDED. 30 tablet 5  . gabapentin (NEURONTIN) 100 MG capsule Take 1 capsule (100 mg total) by mouth 3 (three) times daily. 270 capsule 3  . ibuprofen (ADVIL) 600 MG tablet TAKE 1 TABLET (600 MG TOTAL) BY MOUTH 2 (TWO) TIMES DAILY AS NEEDED. 180 tablet 0  . losartan-hydrochlorothiazide (HYZAAR) 100-25 MG tablet TAKE 1 TABLET BY MOUTH DAILY. 90 tablet 3  . methocarbamol (ROBAXIN) 500 MG tablet TAKE 1 TABLET (500 MG TOTAL) BY MOUTH EVERY 6 (SIX) HOURS AS NEEDED FOR MUSCLE SPASMS. 40 tablet 3  . Multiple Vitamins-Minerals (ADULT GUMMY PO) Take 2 tablets by mouth daily.    . potassium chloride (K-DUR) 10 MEQ tablet TAKE 1 TABLET BY MOUTH ONCE DAILY 90 tablet 3  . trolamine salicylate (ASPERCREME) 10 % cream Apply 1 application topically 2 (two) times daily as needed for muscle pain.     No current facility-administered medications for this visit.     PHYSICAL EXAMINATION: ECOG PERFORMANCE STATUS: 1 - Symptomatic but completely ambulatory   Vitals:   02/22/19 0922  BP: (!) 147/56  Pulse: 65  Resp: 18  Temp: 98.5 F (36.9 C)  SpO2: 100%   Filed Weights   02/22/19 0922  Weight: 143 lb 4.8 oz (65 kg)    GENERAL:alert, no distress and comfortable SKIN: skin color, texture, turgor are normal, no rashes or significant lesions EYES: normal, Conjunctiva are pink and non-injected, sclera clear  NECK: supple, thyroid normal size, non-tender, without nodularity LYMPH:  no palpable lymphadenopathy in the cervical, axillary  LUNGS: clear to auscultation and percussion with normal breathing effort HEART: regular rate & rhythm and no murmurs and no lower extremity edema ABDOMEN:abdomen soft, non-tender and normal bowel sounds Musculoskeletal:no cyanosis of digits and no clubbing  NEURO: alert & oriented x 3 with fluent speech, no focal motor/sensory deficits  LABORATORY DATA:  I have reviewed the data as listed CBC Latest Ref Rng & Units 02/22/2019 11/22/2018 08/24/2018  WBC  4.0 - 10.5 K/uL 5.6 5.2 4.6  Hemoglobin 12.0 - 15.0 g/dL 10.3(L) 9.8(L) 9.5(L)  Hematocrit 36.0 - 46.0 % 30.1(L) 28.8(L) 27.2(L)  Platelets 150 - 400 K/uL 398 359 303     CMP Latest Ref Rng & Units 02/22/2019 11/22/2018 08/24/2018  Glucose 70 - 99 mg/dL 146(H) 118(H) 121(H)  BUN 8 - 23 mg/dL 12 21 10   Creatinine 0.44 - 1.00 mg/dL 1.08(H) 1.05(H) 0.99  Sodium 135 - 145 mmol/L 136 134(L) 132(L)  Potassium 3.5 - 5.1 mmol/L 3.7 4.2 3.6  Chloride 98 - 111 mmol/L 99 99 95(L)  CO2 22 - 32 mmol/L 26 25 27   Calcium 8.9 - 10.3 mg/dL 9.3 9.5 9.3  Total Protein 6.5 - 8.1 g/dL 7.2 7.3 7.1  Total Bilirubin 0.3 - 1.2 mg/dL 0.5 0.4 0.4  Alkaline Phos 38 - 126 U/L 58 52 57  AST 15 - 41 U/L 18 17 16   ALT 0 - 44 U/L 6 <6 6      RADIOGRAPHIC STUDIES: I have personally reviewed the radiological images as listed and agreed with the findings in the report. No results found.   ASSESSMENT & PLAN:  Paula Griffith is a 73 y.o. female with   1. Myeloproliferative  neoplasm-essential thrombocythemia, JAK2 mutation negative.  -We previously reviewed the natural history of ET, most patients do well, major complication is thrombosis. -She was on Hydrea before starting in 05/2015, I stopped in 06/2015 due to her anemia. -she was switched to anagrelide for disease control in 05/2015.  -Her anagrelide dose has been titrated as needed. We will monitor her EKG periodically. She is currently on Anagrelide 1mg  daily except Tuesdays/thursdays.  -Anemia has since improved. -She is clinically doing well and stable. Labs reviewed, CBC and CMP WNL except Hg 10.3, BG 146, Cr 1.08.  -She is on Lasix only as needed for leg swelling -continue anagrelide 1 mg daily for 5 days a week. -f/u in 6 months, with lab in 3 months    2. Arthritis and back pain, Fall   -She will continue follow-up with her PCP and pain management clinic. -Back pain is stable and moderate, she has no limitation on her activities  -She recently fell in 12/2018. She did hurt her left hand but with brace it has improved. She has an alert necklace she can use in case of emergency or Fall.   3. Hypertension and other medical problems -She'll continue follow-up with her primary care physician  4. Anemia  -secondary to MPN and previous hydrea  -continue monitoring, Hg 10.3 today (02/22/19)  Plan -Lab reviewed, continue anagrelide 1 mg daily for 5 days a week -I refilled anagrelide today  -Lab in 3 months, lab and follow-up in 6 months   No problem-specific Assessment & Plan notes found for this encounter.   No orders of the defined types were placed in this encounter.  All questions were answered. The patient knows to call the clinic with any problems, questions or concerns. No barriers to learning was detected. I spent 15 minutes counseling the patient face to face. The total time spent in the appointment was 20 minutes and more than 50% was on counseling and review of test results     Truitt Merle, MD 02/22/2019   I, Joslyn Devon, am acting as scribe for Truitt Merle, MD.   I have reviewed the above documentation for accuracy and completeness, and I agree with the above.

## 2019-02-22 ENCOUNTER — Inpatient Hospital Stay (HOSPITAL_BASED_OUTPATIENT_CLINIC_OR_DEPARTMENT_OTHER): Payer: Medicare Other | Admitting: Hematology

## 2019-02-22 ENCOUNTER — Encounter: Payer: Self-pay | Admitting: Hematology

## 2019-02-22 ENCOUNTER — Telehealth: Payer: Self-pay | Admitting: Hematology

## 2019-02-22 ENCOUNTER — Other Ambulatory Visit: Payer: Self-pay

## 2019-02-22 ENCOUNTER — Inpatient Hospital Stay: Payer: Medicare Other | Attending: Hematology

## 2019-02-22 VITALS — BP 147/56 | HR 65 | Temp 98.5°F | Resp 18 | Ht 62.0 in | Wt 143.3 lb

## 2019-02-22 DIAGNOSIS — I10 Essential (primary) hypertension: Secondary | ICD-10-CM | POA: Diagnosis not present

## 2019-02-22 DIAGNOSIS — D471 Chronic myeloproliferative disease: Secondary | ICD-10-CM

## 2019-02-22 DIAGNOSIS — Z791 Long term (current) use of non-steroidal anti-inflammatories (NSAID): Secondary | ICD-10-CM | POA: Diagnosis not present

## 2019-02-22 DIAGNOSIS — Z7982 Long term (current) use of aspirin: Secondary | ICD-10-CM | POA: Diagnosis not present

## 2019-02-22 DIAGNOSIS — D649 Anemia, unspecified: Secondary | ICD-10-CM | POA: Diagnosis not present

## 2019-02-22 DIAGNOSIS — Z79899 Other long term (current) drug therapy: Secondary | ICD-10-CM | POA: Diagnosis not present

## 2019-02-22 DIAGNOSIS — J45909 Unspecified asthma, uncomplicated: Secondary | ICD-10-CM | POA: Diagnosis not present

## 2019-02-22 DIAGNOSIS — D473 Essential (hemorrhagic) thrombocythemia: Secondary | ICD-10-CM | POA: Diagnosis not present

## 2019-02-22 DIAGNOSIS — Z7951 Long term (current) use of inhaled steroids: Secondary | ICD-10-CM | POA: Insufficient documentation

## 2019-02-22 LAB — CMP (CANCER CENTER ONLY)
ALT: 6 U/L (ref 0–44)
AST: 18 U/L (ref 15–41)
Albumin: 4 g/dL (ref 3.5–5.0)
Alkaline Phosphatase: 58 U/L (ref 38–126)
Anion gap: 11 (ref 5–15)
BUN: 12 mg/dL (ref 8–23)
CO2: 26 mmol/L (ref 22–32)
Calcium: 9.3 mg/dL (ref 8.9–10.3)
Chloride: 99 mmol/L (ref 98–111)
Creatinine: 1.08 mg/dL — ABNORMAL HIGH (ref 0.44–1.00)
GFR, Est AFR Am: 59 mL/min — ABNORMAL LOW (ref >60)
GFR, Estimated: 51 mL/min — ABNORMAL LOW (ref >60)
Glucose, Bld: 146 mg/dL — ABNORMAL HIGH (ref 70–99)
Potassium: 3.7 mmol/L (ref 3.5–5.1)
Sodium: 136 mmol/L (ref 135–145)
Total Bilirubin: 0.5 mg/dL (ref 0.3–1.2)
Total Protein: 7.2 g/dL (ref 6.5–8.1)

## 2019-02-22 LAB — CBC WITH DIFFERENTIAL/PLATELET
Abs Immature Granulocytes: 0.01 K/uL (ref 0.00–0.07)
Basophils Absolute: 0.1 K/uL (ref 0.0–0.1)
Basophils Relative: 1 %
Eosinophils Absolute: 0.1 K/uL (ref 0.0–0.5)
Eosinophils Relative: 1 %
HCT: 30.1 % — ABNORMAL LOW (ref 36.0–46.0)
Hemoglobin: 10.3 g/dL — ABNORMAL LOW (ref 12.0–15.0)
Immature Granulocytes: 0 %
Lymphocytes Relative: 26 %
Lymphs Abs: 1.4 K/uL (ref 0.7–4.0)
MCH: 31.4 pg (ref 26.0–34.0)
MCHC: 34.2 g/dL (ref 30.0–36.0)
MCV: 91.8 fL (ref 80.0–100.0)
Monocytes Absolute: 0.5 K/uL (ref 0.1–1.0)
Monocytes Relative: 10 %
Neutro Abs: 3.5 K/uL (ref 1.7–7.7)
Neutrophils Relative %: 62 %
Platelets: 398 K/uL (ref 150–400)
RBC: 3.28 MIL/uL — ABNORMAL LOW (ref 3.87–5.11)
RDW: 11.4 % — ABNORMAL LOW (ref 11.5–15.5)
WBC: 5.6 K/uL (ref 4.0–10.5)
nRBC: 0 % (ref 0.0–0.2)

## 2019-02-22 MED ORDER — ANAGRELIDE HCL 1 MG PO CAPS: ORAL_CAPSULE | ORAL | 5 refills | Status: DC

## 2019-02-22 NOTE — Telephone Encounter (Signed)
Scheduled appt per 8/26 los. ° °Printed calendar and avs. °

## 2019-03-20 ENCOUNTER — Other Ambulatory Visit: Payer: Self-pay | Admitting: Internal Medicine

## 2019-03-23 ENCOUNTER — Other Ambulatory Visit: Payer: Self-pay | Admitting: Internal Medicine

## 2019-03-27 ENCOUNTER — Other Ambulatory Visit: Payer: Self-pay | Admitting: Internal Medicine

## 2019-03-27 DIAGNOSIS — Z1231 Encounter for screening mammogram for malignant neoplasm of breast: Secondary | ICD-10-CM

## 2019-04-03 ENCOUNTER — Ambulatory Visit (INDEPENDENT_AMBULATORY_CARE_PROVIDER_SITE_OTHER): Payer: Medicare Other | Admitting: Internal Medicine

## 2019-04-03 ENCOUNTER — Other Ambulatory Visit: Payer: Self-pay

## 2019-04-03 ENCOUNTER — Encounter: Payer: Self-pay | Admitting: Internal Medicine

## 2019-04-03 VITALS — BP 136/72 | HR 71 | Temp 98.6°F | Ht 62.0 in | Wt 142.0 lb

## 2019-04-03 DIAGNOSIS — I10 Essential (primary) hypertension: Secondary | ICD-10-CM

## 2019-04-03 DIAGNOSIS — R269 Unspecified abnormalities of gait and mobility: Secondary | ICD-10-CM | POA: Diagnosis not present

## 2019-04-03 DIAGNOSIS — Z23 Encounter for immunization: Secondary | ICD-10-CM | POA: Diagnosis not present

## 2019-04-03 DIAGNOSIS — R739 Hyperglycemia, unspecified: Secondary | ICD-10-CM | POA: Diagnosis not present

## 2019-04-03 DIAGNOSIS — D471 Chronic myeloproliferative disease: Secondary | ICD-10-CM | POA: Diagnosis not present

## 2019-04-03 MED ORDER — LOSARTAN POTASSIUM-HCTZ 100-25 MG PO TABS: 1.0000 | ORAL_TABLET | Freq: Every day | ORAL | 3 refills | Status: DC

## 2019-04-03 MED ORDER — IBUPROFEN 600 MG PO TABS: 600.0000 mg | ORAL_TABLET | Freq: Two times a day (BID) | ORAL | 1 refills | Status: DC | PRN

## 2019-04-03 NOTE — Assessment & Plan Note (Signed)
A1c

## 2019-04-03 NOTE — Progress Notes (Signed)
Subjective:  Patient ID: Paula Griffith, female    DOB: Nov 10, 1945  Age: 73 y.o. MRN: [id number]  CC: No chief complaint on file.   HPI NAKEITA ULMAN presents for HTN, OA, elev glucose  Outpatient Medications Prior to Visit  Medication Sig Dispense Refill  . albuterol (PROAIR HFA) 108 (90 Base) MCG/ACT inhaler Inhale 2 puffs into the lungs every 4 (four) hours as needed. For shortness of breath 1 Inhaler 3  . amLODipine (NORVASC) 5 MG tablet TAKE 1 TABLET BY MOUTH EVERY DAY 90 tablet 3  . anagrelide (AGRYLIN) 1 MG capsule TAKE 1 CAPSULE (1 MG TOTAL) BY MOUTH DAILY EXCEPT 2 DAYS A WEEK DO NOT TAKE. 66 capsule 5  . Ascorbic Acid (VITAMIN C) 1000 MG tablet Take 1,000 mg by mouth daily.    Marland Kitchen aspirin 325 MG EC tablet Take 325 mg by mouth daily after breakfast.      . Biotin 10000 MCG TABS Take 10,000 mcg by mouth daily.    . carvedilol (COREG) 25 MG tablet Take 1 tablet (25 mg total) by mouth 2 (two) times daily. 180 tablet 3  . Cholecalciferol (VITAMIN D3) 2000 units TABS Take 2,000 Units by mouth daily.    . Fluticasone-Salmeterol (ADVAIR) 250-50 MCG/DOSE AEPB Please specify directions, refills and quantity 1 each 5  . furosemide (LASIX) 20 MG tablet TAKE 1-2 TABLETS (20-40 MG TOTAL) BY MOUTH DAILY AS NEEDED. 30 tablet 5  . gabapentin (NEURONTIN) 100 MG capsule TAKE 1 CAPSULE BY MOUTH THREE TIMES A DAY 270 capsule 3  . ibuprofen (ADVIL) 600 MG tablet TAKE 1 TABLET (600 MG TOTAL) BY MOUTH 2 (TWO) TIMES DAILY AS NEEDED. 180 tablet 0  . losartan-hydrochlorothiazide (HYZAAR) 100-25 MG tablet TAKE 1 TABLET BY MOUTH DAILY. 90 tablet 3  . methocarbamol (ROBAXIN) 500 MG tablet TAKE 1 TABLET (500 MG TOTAL) BY MOUTH EVERY 6 (SIX) HOURS AS NEEDED FOR MUSCLE SPASMS. 40 tablet 3  . Multiple Vitamins-Minerals (ADULT GUMMY PO) Take 2 tablets by mouth daily.    . potassium chloride (K-DUR) 10 MEQ tablet TAKE 1 TABLET BY MOUTH ONCE DAILY 90 tablet 3  . trolamine salicylate (ASPERCREME) 10 % cream Apply 1  application topically 2 (two) times daily as needed for muscle pain.     No facility-administered medications prior to visit.     ROS: Review of Systems  Constitutional: Negative for activity change, appetite change, chills and unexpected weight change.  HENT: Negative for congestion, mouth sores and sinus pressure.   Eyes: Negative for visual disturbance.  Respiratory: Negative for cough and chest tightness.   Gastrointestinal: Negative for abdominal pain and nausea.  Genitourinary: Negative for difficulty urinating, frequency and vaginal pain.  Musculoskeletal: Positive for arthralgias, back pain and gait problem.  Skin: Negative for pallor and rash.  Neurological: Negative for dizziness, tremors, weakness, numbness and headaches.  Psychiatric/Behavioral: Negative for confusion, sleep disturbance and suicidal ideas.    Objective:  BP 136/72 (BP Location: Left Arm, Patient Position: Sitting, Cuff Size: Normal)   Pulse 71   Temp 98.6 F (37 C) (Oral)   Ht 5\' 2"  (1.575 m)   Wt 142 lb (64.4 kg)   SpO2 98%   BMI 25.97 kg/m   BP Readings from Last 3 Encounters:  04/03/19 136/72  02/22/19 (!) 147/56  11/30/18 130/60    Wt Readings from Last 3 Encounters:  04/03/19 142 lb (64.4 kg)  02/22/19 143 lb 4.8 oz (65 kg)  11/30/18 145 lb 0.6 oz (  65.8 kg)    Physical Exam Constitutional:      General: She is not in acute distress.    Appearance: She is well-developed.  HENT:     Head: Normocephalic.     Right Ear: External ear normal.     Left Ear: External ear normal.     Nose: Nose normal.  Eyes:     General:        Right eye: No discharge.        Left eye: No discharge.     Conjunctiva/sclera: Conjunctivae normal.     Pupils: Pupils are equal, round, and reactive to light.  Neck:     Musculoskeletal: Normal range of motion and neck supple.     Thyroid: No thyromegaly.     Vascular: No JVD.     Trachea: No tracheal deviation.  Cardiovascular:     Rate and Rhythm:  Normal rate and regular rhythm.     Heart sounds: Normal heart sounds.  Pulmonary:     Effort: No respiratory distress.     Breath sounds: No stridor. No wheezing.  Abdominal:     General: Bowel sounds are normal. There is no distension.     Palpations: Abdomen is soft. There is no mass.     Tenderness: There is no abdominal tenderness. There is no guarding or rebound.  Musculoskeletal:        General: No tenderness.  Lymphadenopathy:     Cervical: No cervical adenopathy.  Skin:    Findings: No erythema or rash.  Neurological:     Cranial Nerves: No cranial nerve deficit.     Motor: No abnormal muscle tone.     Coordination: Coordination normal.     Deep Tendon Reflexes: Reflexes normal.  Psychiatric:        Behavior: Behavior normal.        Thought Content: Thought content normal.        Judgment: Judgment normal.   cane  Lab Results  Component Value Date   WBC 5.6 02/22/2019   HGB 10.3 (L) 02/22/2019   HCT 30.1 (L) 02/22/2019   PLT 398 02/22/2019   GLUCOSE 146 (H) 02/22/2019   CHOL 158 03/01/2018   TRIG 84.0 03/01/2018   HDL 47.10 03/01/2018   LDLCALC 94 03/01/2018   ALT 6 02/22/2019   AST 18 02/22/2019   NA 136 02/22/2019   K 3.7 02/22/2019   CL 99 02/22/2019   CREATININE 1.08 (H) 02/22/2019   BUN 12 02/22/2019   CO2 26 02/22/2019   TSH 1.29 03/01/2018   INR 0.97 12/29/2017   HGBA1C 6.8 (H) 08/26/2017    Mm 3d Screen Breast Bilateral  Result Date: 04/08/2018 CLINICAL DATA:  Screening. EXAM: DIGITAL SCREENING BILATERAL MAMMOGRAM WITH TOMO AND CAD COMPARISON:  Previous exam(s). ACR Breast Density Category b: There are scattered areas of fibroglandular density. FINDINGS: There are no findings suspicious for malignancy. Images were processed with CAD. IMPRESSION: No mammographic evidence of malignancy. A result letter of this screening mammogram will be mailed directly to the patient. RECOMMENDATION: Screening mammogram in one year. (Code:SM-B-01Y) BI-RADS  CATEGORY  1: Negative. Electronically Signed   By: Lajean Manes M.D.   On: 04/08/2018 11:49    Assessment & Plan:   There are no diagnoses linked to this encounter.   No orders of the defined types were placed in this encounter.    Follow-up: No follow-ups on file.  [name], MD

## 2019-04-03 NOTE — Assessment & Plan Note (Signed)
stable °

## 2019-04-03 NOTE — Assessment & Plan Note (Signed)
Cane

## 2019-04-03 NOTE — Assessment & Plan Note (Signed)
Chronic  Losartan HCT, Amlodipine

## 2019-04-04 NOTE — Addendum Note (Signed)
Addended by: Karren Cobble on: 04/04/2019 09:30 AM   Modules accepted: Orders

## 2019-04-24 ENCOUNTER — Other Ambulatory Visit: Payer: Self-pay | Admitting: Internal Medicine

## 2019-05-12 ENCOUNTER — Ambulatory Visit
Admission: RE | Admit: 2019-05-12 | Discharge: 2019-05-12 | Disposition: A | Discharge status: Home / Self Care | Payer: Medicare Other | Source: Ambulatory Visit | Attending: Internal Medicine | Admitting: Internal Medicine

## 2019-05-12 ENCOUNTER — Other Ambulatory Visit: Payer: Self-pay

## 2019-05-12 DIAGNOSIS — Z1231 Encounter for screening mammogram for malignant neoplasm of breast: Secondary | ICD-10-CM

## 2019-05-22 ENCOUNTER — Other Ambulatory Visit: Payer: Self-pay | Admitting: Internal Medicine

## 2019-05-22 NOTE — Telephone Encounter (Signed)
Which dose Carvedilol is correct?  25 mg 1 bid  or 12.5 mg 1 bid?   25 mg was last Rx'd by you.

## 2019-05-23 ENCOUNTER — Other Ambulatory Visit: Payer: Self-pay

## 2019-05-23 ENCOUNTER — Inpatient Hospital Stay: Payer: Medicare Other | Attending: Hematology

## 2019-05-23 DIAGNOSIS — D471 Chronic myeloproliferative disease: Secondary | ICD-10-CM

## 2019-05-23 DIAGNOSIS — D473 Essential (hemorrhagic) thrombocythemia: Secondary | ICD-10-CM | POA: Diagnosis not present

## 2019-05-23 LAB — CBC WITH DIFFERENTIAL/PLATELET
Abs Immature Granulocytes: 0.01 10*3/uL (ref 0.00–0.07)
Basophils Absolute: 0 10*3/uL (ref 0.0–0.1)
Basophils Relative: 1 %
Eosinophils Absolute: 0.1 10*3/uL (ref 0.0–0.5)
Eosinophils Relative: 1 %
HCT: 29.1 % — ABNORMAL LOW (ref 36.0–46.0)
Hemoglobin: 10.2 g/dL — ABNORMAL LOW (ref 12.0–15.0)
Immature Granulocytes: 0 %
Lymphocytes Relative: 25 %
Lymphs Abs: 1.4 10*3/uL (ref 0.7–4.0)
MCH: 31.8 pg (ref 26.0–34.0)
MCHC: 35.1 g/dL (ref 30.0–36.0)
MCV: 90.7 fL (ref 80.0–100.0)
Monocytes Absolute: 0.5 10*3/uL (ref 0.1–1.0)
Monocytes Relative: 9 %
Neutro Abs: 3.5 10*3/uL (ref 1.7–7.7)
Neutrophils Relative %: 64 %
Platelets: 406 10*3/uL — ABNORMAL HIGH (ref 150–400)
RBC: 3.21 MIL/uL — ABNORMAL LOW (ref 3.87–5.11)
RDW: 11.2 % — ABNORMAL LOW (ref 11.5–15.5)
WBC: 5.5 10*3/uL (ref 4.0–10.5)
nRBC: 0 % (ref 0.0–0.2)

## 2019-05-23 LAB — CMP (CANCER CENTER ONLY)
ALT: 7 U/L (ref 0–44)
AST: 18 U/L (ref 15–41)
Albumin: 4 g/dL (ref 3.5–5.0)
Alkaline Phosphatase: 59 U/L (ref 38–126)
Anion gap: 11 (ref 5–15)
BUN: 16 mg/dL (ref 8–23)
CO2: 23 mmol/L (ref 22–32)
Calcium: 9.5 mg/dL (ref 8.9–10.3)
Chloride: 99 mmol/L (ref 98–111)
Creatinine: 0.88 mg/dL (ref 0.44–1.00)
GFR, Est AFR Am: >60 mL/min (ref >60)
GFR, Estimated: >60 mL/min (ref >60)
Glucose, Bld: 108 mg/dL — ABNORMAL HIGH (ref 70–99)
Potassium: 4.1 mmol/L (ref 3.5–5.1)
Sodium: 133 mmol/L — ABNORMAL LOW (ref 135–145)
Total Bilirubin: 0.4 mg/dL (ref 0.3–1.2)
Total Protein: 7.3 g/dL (ref 6.5–8.1)

## 2019-05-24 ENCOUNTER — Encounter: Payer: Self-pay | Admitting: Hematology

## 2019-06-24 ENCOUNTER — Other Ambulatory Visit: Payer: Self-pay | Admitting: Internal Medicine

## 2019-07-06 ENCOUNTER — Telehealth: Payer: Self-pay | Admitting: Internal Medicine

## 2019-07-06 NOTE — Telephone Encounter (Signed)
Pt stated the pharmacy told her rx for valsartan-hydrochlorothiazide (DIOVAN-HCT) 160-25 MG tablet  Did not have instructions or dosage. Please clarify.  [id number] [name], [place] Phone:  [phone]  Fax:  [phone]

## 2019-07-19 NOTE — Telephone Encounter (Signed)
Patient is calling back very upset because pharmacy told her rx for valsartan-hydrochlorothiazide (DIOVAN-HCT) 160-25 MG tablet  Did not have instructions or dosage. She stated she has called several times and needs to know if she still needs this medication. Would like a call back ASAP

## 2019-07-19 NOTE — Telephone Encounter (Signed)
Please clarify instructions so I can send in RX

## 2019-07-20 MED ORDER — VALSARTAN-HYDROCHLOROTHIAZIDE 160-25 MG PO TABS: 1.0000 | ORAL_TABLET | Freq: Every day | ORAL | 3 refills | Status: DC

## 2019-07-20 NOTE — Telephone Encounter (Signed)
It is strange. Sorry! Done

## 2019-08-07 ENCOUNTER — Ambulatory Visit (INDEPENDENT_AMBULATORY_CARE_PROVIDER_SITE_OTHER): Payer: Medicare Other | Admitting: Internal Medicine

## 2019-08-07 ENCOUNTER — Other Ambulatory Visit: Payer: Self-pay

## 2019-08-07 ENCOUNTER — Encounter: Payer: Self-pay | Admitting: Internal Medicine

## 2019-08-07 DIAGNOSIS — J4531 Mild persistent asthma with (acute) exacerbation: Secondary | ICD-10-CM

## 2019-08-07 DIAGNOSIS — R269 Unspecified abnormalities of gait and mobility: Secondary | ICD-10-CM

## 2019-08-07 DIAGNOSIS — J301 Allergic rhinitis due to pollen: Secondary | ICD-10-CM

## 2019-08-07 DIAGNOSIS — I739 Peripheral vascular disease, unspecified: Secondary | ICD-10-CM

## 2019-08-07 DIAGNOSIS — I10 Essential (primary) hypertension: Secondary | ICD-10-CM

## 2019-08-07 MED ORDER — POTASSIUM CHLORIDE ER 10 MEQ PO TBCR: 10.0000 meq | EXTENDED_RELEASE_TABLET | Freq: Every day | ORAL | 3 refills | Status: DC

## 2019-08-07 MED ORDER — ALBUTEROL SULFATE HFA 108 (90 BASE) MCG/ACT IN AERS: 2.0000 | INHALATION_SPRAY | RESPIRATORY_TRACT | 5 refills | Status: DC | PRN

## 2019-08-07 MED ORDER — CARVEDILOL 12.5 MG PO TABS: 12.5000 mg | ORAL_TABLET | Freq: Two times a day (BID) | ORAL | 3 refills | Status: DC

## 2019-08-07 MED ORDER — FLUTICASONE-SALMETEROL 250-50 MCG/DOSE IN AEPB: INHALATION_SPRAY | RESPIRATORY_TRACT | 11 refills | Status: DC

## 2019-08-07 NOTE — Progress Notes (Signed)
Subjective:  Patient ID: ETHERINE REDRICK, female    DOB: [birth year]  Age: 74 y.o. MRN: [id number]  CC: No chief complaint on file.   HPI AKYLAH WOJAHN presents for HTN, LBP f/u  Outpatient Medications Prior to Visit  Medication Sig Dispense Refill  . albuterol (PROAIR HFA) 108 (90 Base) MCG/ACT inhaler Inhale 2 puffs into the lungs every 4 (four) hours as needed. For shortness of breath 1 Inhaler 3  . amLODipine (NORVASC) 5 MG tablet TAKE 1 TABLET BY MOUTH EVERY DAY 90 tablet 3  . anagrelide (AGRYLIN) 1 MG capsule TAKE 1 CAPSULE (1 MG TOTAL) BY MOUTH DAILY EXCEPT 2 DAYS A WEEK DO NOT TAKE. 66 capsule 5  . Ascorbic Acid (VITAMIN C) 1000 MG tablet Take 1,000 mg by mouth daily.    Marland Kitchen aspirin 325 MG EC tablet Take 325 mg by mouth daily after breakfast.      . Biotin 10000 MCG TABS Take 10,000 mcg by mouth daily.    . carvedilol (COREG) 12.5 MG tablet TAKE 1 TABLET (12.5 MG TOTAL) BY MOUTH 2 (TWO) TIMES DAILY WITH A MEAL. 180 tablet 3  . Cholecalciferol (VITAMIN D3) 2000 units TABS Take 2,000 Units by mouth daily.    . Fluticasone-Salmeterol (ADVAIR) 250-50 MCG/DOSE AEPB Please specify directions, refills and quantity 1 each 5  . furosemide (LASIX) 20 MG tablet TAKE 1-2 TABLETS (20-40 MG TOTAL) BY MOUTH DAILY AS NEEDED. 180 tablet 1  . gabapentin (NEURONTIN) 100 MG capsule TAKE 1 CAPSULE BY MOUTH THREE TIMES A DAY 270 capsule 3  . ibuprofen (ADVIL) 600 MG tablet Take 1 tablet (600 mg total) by mouth 2 (two) times daily as needed. 180 tablet 1  . methocarbamol (ROBAXIN) 500 MG tablet TAKE 1 TABLET (500 MG TOTAL) BY MOUTH EVERY 6 (SIX) HOURS AS NEEDED FOR MUSCLE SPASMS. 40 tablet 3  . Multiple Vitamins-Minerals (ADULT GUMMY PO) Take 2 tablets by mouth daily.    . potassium chloride (KLOR-CON) 10 MEQ tablet TAKE 1 TABLET BY MOUTH ONCE DAILY 90 tablet 3  . trolamine salicylate (ASPERCREME) 10 % cream Apply 1 application topically 2 (two) times daily as needed for muscle pain.    .  valsartan-hydrochlorothiazide (DIOVAN-HCT) 160-25 MG tablet Take 1 tablet by mouth daily. 90 tablet 3  . carvedilol (COREG) 25 MG tablet Take 1 tablet (25 mg total) by mouth 2 (two) times daily. 180 tablet 3   No facility-administered medications prior to visit.    ROS: Review of Systems  Constitutional: Negative for activity change, appetite change, chills, fatigue and unexpected weight change.  HENT: Negative for congestion, mouth sores and sinus pressure.   Eyes: Negative for visual disturbance.  Respiratory: Negative for cough and chest tightness.   Gastrointestinal: Negative for abdominal pain and nausea.  Genitourinary: Negative for difficulty urinating, frequency and vaginal pain.  Musculoskeletal: Negative for back pain and gait problem.  Skin: Negative for pallor and rash.  Neurological: Negative for dizziness, tremors, weakness, numbness and headaches.  Psychiatric/Behavioral: Negative for confusion and sleep disturbance.    Objective:  BP 134/78 (BP Location: Left Arm, Patient Position: Sitting, Cuff Size: Normal)   Pulse (!) 54   Temp 99 F (37.2 C) (Oral)   Ht 5\' 2"  (1.575 m)   Wt 141 lb (64 kg)   SpO2 99%   BMI 25.79 kg/m   BP Readings from Last 3 Encounters:  08/07/19 134/78  04/03/19 136/72  02/22/19 (!) 147/56    Wt Readings from Last  3 Encounters:  08/07/19 141 lb (64 kg)  04/03/19 142 lb (64.4 kg)  02/22/19 143 lb 4.8 oz (65 kg)    Physical Exam Constitutional:      General: She is not in acute distress.    Appearance: She is well-developed.  HENT:     Head: Normocephalic.     Right Ear: External ear normal.     Left Ear: External ear normal.     Nose: Nose normal.  Eyes:     General:        Right eye: No discharge.        Left eye: No discharge.     Conjunctiva/sclera: Conjunctivae normal.     Pupils: Pupils are equal, round, and reactive to light.  Neck:     Thyroid: No thyromegaly.     Vascular: No JVD.     Trachea: No tracheal  deviation.  Cardiovascular:     Rate and Rhythm: Normal rate and regular rhythm.     Heart sounds: Normal heart sounds.  Pulmonary:     Effort: No respiratory distress.     Breath sounds: No stridor. No wheezing.  Abdominal:     General: Bowel sounds are normal. There is no distension.     Palpations: Abdomen is soft. There is no mass.     Tenderness: There is no abdominal tenderness. There is no guarding or rebound.  Musculoskeletal:        General: Tenderness present.     Cervical back: Normal range of motion and neck supple.  Lymphadenopathy:     Cervical: No cervical adenopathy.  Skin:    Findings: No erythema or rash.  Neurological:     Cranial Nerves: No cranial nerve deficit.     Motor: No abnormal muscle tone.     Coordination: Coordination normal.     Gait: Gait abnormal.     Deep Tendon Reflexes: Reflexes normal.  Psychiatric:        Behavior: Behavior normal.        Thought Content: Thought content normal.        Judgment: Judgment normal.     Lab Results  Component Value Date   WBC 5.5 05/23/2019   HGB 10.2 (L) 05/23/2019   HCT 29.1 (L) 05/23/2019   PLT 406 (H) 05/23/2019   GLUCOSE 108 (H) 05/23/2019   CHOL 158 03/01/2018   TRIG 84.0 03/01/2018   HDL 47.10 03/01/2018   LDLCALC 94 03/01/2018   ALT 7 05/23/2019   AST 18 05/23/2019   NA 133 (L) 05/23/2019   K 4.1 05/23/2019   CL 99 05/23/2019   CREATININE 0.88 05/23/2019   BUN 16 05/23/2019   CO2 23 05/23/2019   TSH 1.29 03/01/2018   INR 0.97 12/29/2017   HGBA1C 6.8 (H) 08/26/2017    MM 3D SCREEN BREAST BILATERAL  Result Date: 05/14/2019 CLINICAL DATA:  Screening. EXAM: DIGITAL SCREENING BILATERAL MAMMOGRAM WITH TOMO AND CAD COMPARISON:  Previous exam(s). ACR Breast Density Category b: There are scattered areas of fibroglandular density. FINDINGS: There are no findings suspicious for malignancy. Images were processed with CAD. IMPRESSION: No mammographic evidence of malignancy. A result letter of  this screening mammogram will be mailed directly to the patient. RECOMMENDATION: Screening mammogram in one year. (Code:SM-B-01Y) BI-RADS CATEGORY  1: Negative. Electronically Signed   By: Kristopher Oppenheim M.D.   On: 05/14/2019 14:31    Assessment & Plan:   There are no diagnoses linked to this encounter.   No orders  of the defined types were placed in this encounter.    Follow-up: No follow-ups on file.  [name], MD

## 2019-08-08 ENCOUNTER — Encounter: Payer: Self-pay | Admitting: Internal Medicine

## 2019-08-08 NOTE — Assessment & Plan Note (Addendum)
Restart valsartan HCT.  Continue carvedilol

## 2019-08-08 NOTE — Assessment & Plan Note (Signed)
Claritin as needed

## 2019-08-08 NOTE — Assessment & Plan Note (Signed)
Continue with Symbicort 

## 2019-08-08 NOTE — Assessment & Plan Note (Signed)
Status post lumbar fusion

## 2019-08-08 NOTE — Assessment & Plan Note (Signed)
Continue with aspirin. 

## 2019-08-17 ENCOUNTER — Ambulatory Visit: Payer: Medicare Other

## 2019-08-21 ENCOUNTER — Ambulatory Visit: Payer: Medicare Other | Attending: Family

## 2019-08-21 DIAGNOSIS — Z23 Encounter for immunization: Secondary | ICD-10-CM | POA: Insufficient documentation

## 2019-08-21 NOTE — Progress Notes (Signed)
   Covid-[facility] Vaccination [facility]  Name:  Paula Griffith    MRN: [id number] DOB: 1946-03-16  08/21/2019  Paula Griffith was observed post Covid-19 immunization for 15 minutes without incidence. She was provided with Vaccine Information Sheet and instruction to access the V-Safe system.   Paula Griffith was instructed to call 911 with any severe reactions post vaccine: Marland Kitchen Difficulty breathing  . Swelling of your face and throat  . A fast heartbeat  . A bad rash all over your body  . Dizziness and weakness    Immunizations Administered    Name Date Dose VIS Date Route   Moderna COVID-19 Vaccine 08/21/2019 11:32 AM 0.5 mL 05/30/2019 Intramuscular   Manufacturer: Moderna   Lot: [id number]   [id number]

## 2019-08-23 NOTE — Progress Notes (Signed)
[name]   [phone]) [phone] [phone]   Clinic Follow up Note   Patient Care Team: [name], [name], MD as PCP - General Angelia Mould, MD (Vascular Surgery) Ara Kussmaul, MD (Ophthalmology) Nahser, Wonda Cheng, MD as Consulting Physician (Cardiology) Truitt Merle, MD as Consulting Physician (Hematology) Eustace Moore, MD as Consulting Physician (Neurosurgery)  Date of Service:  08/25/2019  CHIEF COMPLAINT: F/u of MPN   PREVIOUS AND CURRENT THERAPY:  1.Hydrea 500 mg Every day except 1,000 mg on M and Thurs. Decreased to 500mg  daily on 06/10/2015, and further decreased to 500mg  daily except M and Thursday due to anemia, stopped on 07/30/2015 2. Added anagrelide 0.5 mg twice daily on 06/10/2015, increased to 1mg  am 0.5mg  pm on 07/30/2015, and decreased to 0.5mg  daily on 09/03/2015, changed to anagrelide 0.5 mg twice daily on MWF, and 0.5mg  daily on the rest of week in 03/2016. Currently on 1mg  anagrelide daily for 5 days of the week. 3. Aspirin 325 mg daily.  INTERVAL HISTORY:  Paula Griffith is here for a follow up of MPN. She was last seen by me 6 months ago. She presents to the clinic alone. She notes she is doing well. She notes she received her first [place] vaccination. She saw Dr. Alain Marion 2 weeks ago. He altered one of her HTN medications. She notes elevated BP today due to waiting for a while in the lobby. She notes she does check her BP at home. She notes she has mainly been living by herself. She notes occasional numbness of her left LE.     REVIEW OF SYSTEMS:   Constitutional: Denies fevers, chills or abnormal weight loss Eyes: Denies blurriness of vision Ears, nose, mouth, throat, and face: Denies mucositis or sore throat Respiratory: Denies cough, dyspnea or wheezes Cardiovascular: Denies palpitation, chest discomfort or lower extremity swelling Gastrointestinal:  Denies nausea, heartburn or change in bowel habits Skin:  Denies abnormal skin rashes Lymphatics: Denies new lymphadenopathy or easy bruising Neurological:Denies numbness, tingling or new weaknesses (+) Occasional numbness of left LE Behavioral/Psych: Mood is stable, no new changes  All other systems were reviewed with the patient and are negative.  MEDICAL HISTORY:  Past Medical History:  Diagnosis Date  . Allergic rhinitis   . Asthma   . Cervicalgia   . Colon polyps   . Complication of anesthesia   . Diverticulosis of colon   . Glucose intolerance (impaired glucose tolerance)   . HTN (hypertension)   . LBP (low back pain)   . Leukemia ([place])   . Osteoarthritis   . PONV (postoperative nausea and vomiting)   . PVD (peripheral vascular disease) ([place]) 2011   Right Carotid Dr Scot Dock  . 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 Spondylolisthesis of lumbar region   . Thrombocytopathy [facility]) 2011   Dr Ralene Ok    SURGICAL HISTORY: Past Surgical History:  Procedure Laterality Date  . ABDOMINAL HYSTERECTOMY  1989  . ANTERIOR CERVICAL DECOMP/DISCECTOMY FUSION N/A 09/22/2017   Procedure: ANTERIOR CERVICAL DECOMPRESSION AND FUSION CERVICAL THREE-FOUR.;  Surgeon: Eustace Moore, MD;  Location: [place];  Service: Neurosurgery;  Laterality: N/A;  anterior  . BACK SURGERY    . BREAST EXCISIONAL BIOPSY Left   . BREAST SURGERY     Left  . LUMBAR LAMINECTOMY/DECOMPRESSION MICRODISCECTOMY Left 01/06/2018   Procedure: laminectomy Lumbar five -Sacral one - left, Lumbar two-Lumbar three - bilateral;  Surgeon: Eustace Moore, MD;  Location: [place];  Service: Neurosurgery;  Laterality: Left;    I have reviewed the social history and family history with the patient and they are unchanged from previous note.  ALLERGIES:  is allergic to benazepril; amlodipine; other; codeine; and tramadol.  MEDICATIONS:  Current Outpatient Medications  Medication Sig Dispense Refill  . albuterol (PROAIR HFA) 108 (90 Base) MCG/ACT inhaler Inhale 2 puffs into the lungs every 4  (four) hours as needed. For shortness of breath 18 g 5  . anagrelide (AGRYLIN) 1 MG capsule TAKE 1 CAPSULE (1 MG TOTAL) BY MOUTH DAILY EXCEPT 2 DAYS A WEEK DO NOT TAKE. 25 capsule 5  . Ascorbic Acid (VITAMIN C) 1000 MG tablet Take 1,000 mg by mouth daily.    Marland Kitchen aspirin 325 MG EC tablet Take 325 mg by mouth daily after breakfast.      . Biotin 10000 MCG TABS Take 10,000 mcg by mouth daily.    . carvedilol (COREG) 12.5 MG tablet Take 1 tablet (12.5 mg total) by mouth 2 (two) times daily with a meal. 180 tablet 3  . Cholecalciferol (VITAMIN D3) 2000 units TABS Take 2,000 Units by mouth daily.    . Fluticasone-Salmeterol (ADVAIR) 250-50 MCG/DOSE AEPB Please specify directions, refills and quantity 1 each 11  . furosemide (LASIX) 20 MG tablet TAKE 1-2 TABLETS (20-40 MG TOTAL) BY MOUTH DAILY AS NEEDED. 180 tablet 1  . gabapentin (NEURONTIN) 100 MG capsule TAKE 1 CAPSULE BY MOUTH THREE TIMES A DAY 270 capsule 3  . ibuprofen (ADVIL) 600 MG tablet Take 1 tablet (600 mg total) by mouth 2 (two) times daily as needed. 180 tablet 1  . methocarbamol (ROBAXIN) 500 MG tablet TAKE 1 TABLET (500 MG TOTAL) BY MOUTH EVERY 6 (SIX) HOURS AS NEEDED FOR MUSCLE SPASMS. 40 tablet 3  . Multiple Vitamins-Minerals (ADULT GUMMY PO) Take 2 tablets by mouth daily.    . potassium chloride (KLOR-CON) 10 MEQ tablet Take 1 tablet (10 mEq total) by mouth daily. 90 tablet 3  . trolamine salicylate (ASPERCREME) 10 % cream Apply 1 application topically 2 (two) times daily as needed for muscle pain.    . valsartan-hydrochlorothiazide (DIOVAN-HCT) 160-25 MG tablet Take 1 tablet by mouth daily. 90 tablet 3   No current facility-administered medications for this visit.    PHYSICAL EXAMINATION: ECOG PERFORMANCE STATUS: 0 - Asymptomatic  Vitals:   08/25/19 0932  BP: (!) 172/65  Pulse: 71  Resp: 18  Temp: 98.1 F (36.7 C)  SpO2: 100%   Filed Weights   08/25/19 0932  Weight: 138 lb 3.2 oz (62.7 kg)    GENERAL:alert, no distress  and comfortable SKIN: skin color, texture, turgor are normal, no rashes or significant lesions EYES: normal, Conjunctiva are pink and non-injected, sclera clear  NECK: supple, thyroid normal size, non-tender, without nodularity LYMPH:  no palpable lymphadenopathy in the cervical, axillary  LUNGS: clear to auscultation and percussion with normal breathing effort HEART: regular rate & rhythm and no murmurs and no lower extremity edema ABDOMEN:abdomen soft, non-tender and normal bowel sounds Musculoskeletal:no cyanosis of digits and no clubbing  NEURO: alert & oriented x 3 with fluent speech, no focal motor/sensory deficits  LABORATORY DATA:  I have reviewed the data as listed CBC Latest Ref Rng & Units 08/25/2019 05/23/2019 02/22/2019  WBC 4.0 - 10.5 K/uL 5.9 5.5 5.6  Hemoglobin 12.0 - 15.0 g/dL 10.5(L) 10.2(L) 10.3(L)  Hematocrit 36.0 - 46.0 % 29.8(L) 29.1(L) 30.1(L)  Platelets 150 - 400 K/uL 323 406(H) 398     CMP Latest Ref  Rng & Units 08/25/2019 05/23/2019 02/22/2019  Glucose 70 - 99 mg/dL 122(H) 108(H) 146(H)  BUN 8 - 23 mg/dL 13 16 12   Creatinine 0.44 - 1.00 mg/dL 0.84 0.88 1.08(H)  Sodium 135 - 145 mmol/L 133(L) 133(L) 136  Potassium 3.5 - 5.1 mmol/L 3.9 4.1 3.7  Chloride 98 - 111 mmol/L 97(L) 99 99  CO2 22 - 32 mmol/L 26 23 26   Calcium 8.9 - 10.3 mg/dL 9.4 9.5 9.3  Total Protein 6.5 - 8.1 g/dL 7.5 7.3 7.2  Total Bilirubin 0.3 - 1.2 mg/dL 0.4 0.4 0.5  Alkaline Phos 38 - 126 U/L 52 59 58  AST 15 - 41 U/L 16 18 18   ALT 0 - 44 U/L 6 7 6       RADIOGRAPHIC STUDIES: I have personally reviewed the radiological images as listed and agreed with the findings in the report. No results found.   ASSESSMENT & PLAN:  Paula Griffith is a 74 y.o. female with   1. Myeloproliferative neoplasm-essential thrombocythemia, JAK2 mutation negative.  -We previously reviewed the natural history of ET, most patients do well, major complication is thrombosis. -She was on Hydrea beforestarting  in 05/2015, I stoppedin 1/2017due to her anemia. -she was switched to anagrelide for disease controlin 05/2015. -Her anagrelide dose has been titratedas needed.We will monitor her EKG periodically. She is currently on Anagrelide 1mg  daily except Tuesdays/thursdays.  -Anagrelide continues to control her plt which is normal now. Hg mostly stable and mild anemia. She is clinically doing well. Physical exam normal.  -Will continue Anagrelide 1 mg daily for 5 days a week. -f/u in6 months, with lab in 3 months    2. Arthritis and back pain, Fall   -She will continue follow-up with her PCP and pain management clinic. -Back pain is stable and moderate,she has no limitation on her activities  -She fell in 12/2018. She did hurt her left hand but with brace it has improved. She has an alert necklace she can use in case of emergency or Fall.  -No recent falls.   3. Hypertension and other medical problems -She'll continue follow-up with her primary care physician  4. Anemia  -secondary to MPN and previous hydrea  -Mild and stable. Continue to monitor.  -I encouraged her to take multivitamin with iron.   Plan -Lab reviewed, continue anagrelide 1 mg daily for 5 days a week, I refilled anagrelide today  -Lab in 3 months, lab and follow-up in 6 months   No problem-specific Assessment & Plan notes found for this encounter.   No orders of the defined types were placed in this encounter.  All questions were answered. The patient knows to call the clinic with any problems, questions or concerns. No barriers to learning was detected. The total time spent in the appointment was 20 minutes.     Truitt Merle, MD 08/25/2019   I, Joslyn Devon, am acting as scribe for Truitt Merle, MD.   I have reviewed the above documentation for accuracy and completeness, and I agree with the above.

## 2019-08-25 ENCOUNTER — Inpatient Hospital Stay: Payer: Medicare Other

## 2019-08-25 ENCOUNTER — Other Ambulatory Visit: Payer: Self-pay

## 2019-08-25 ENCOUNTER — Encounter: Payer: Self-pay | Admitting: Hematology

## 2019-08-25 ENCOUNTER — Inpatient Hospital Stay: Payer: Medicare Other | Attending: Hematology | Admitting: Hematology

## 2019-08-25 VITALS — BP 172/65 | HR 71 | Temp 98.1°F | Resp 18 | Ht 62.0 in | Wt 138.2 lb

## 2019-08-25 DIAGNOSIS — Z90710 Acquired absence of both cervix and uterus: Secondary | ICD-10-CM | POA: Insufficient documentation

## 2019-08-25 DIAGNOSIS — Z79899 Other long term (current) drug therapy: Secondary | ICD-10-CM | POA: Diagnosis not present

## 2019-08-25 DIAGNOSIS — D473 Essential (hemorrhagic) thrombocythemia: Secondary | ICD-10-CM | POA: Insufficient documentation

## 2019-08-25 DIAGNOSIS — Z9181 History of falling: Secondary | ICD-10-CM | POA: Diagnosis not present

## 2019-08-25 DIAGNOSIS — Z7982 Long term (current) use of aspirin: Secondary | ICD-10-CM | POA: Insufficient documentation

## 2019-08-25 DIAGNOSIS — M1990 Unspecified osteoarthritis, unspecified site: Secondary | ICD-10-CM | POA: Insufficient documentation

## 2019-08-25 DIAGNOSIS — I10 Essential (primary) hypertension: Secondary | ICD-10-CM | POA: Diagnosis not present

## 2019-08-25 DIAGNOSIS — D649 Anemia, unspecified: Secondary | ICD-10-CM | POA: Insufficient documentation

## 2019-08-25 DIAGNOSIS — D471 Chronic myeloproliferative disease: Secondary | ICD-10-CM | POA: Diagnosis not present

## 2019-08-25 LAB — CBC WITH DIFFERENTIAL/PLATELET
Abs Immature Granulocytes: 0.01 10*3/uL (ref 0.00–0.07)
Basophils Absolute: 0.1 10*3/uL (ref 0.0–0.1)
Basophils Relative: 1 %
Eosinophils Absolute: 0.1 10*3/uL (ref 0.0–0.5)
Eosinophils Relative: 1 %
HCT: 29.8 % — ABNORMAL LOW (ref 36.0–46.0)
Hemoglobin: 10.5 g/dL — ABNORMAL LOW (ref 12.0–15.0)
Immature Granulocytes: 0 %
Lymphocytes Relative: 21 %
Lymphs Abs: 1.2 10*3/uL (ref 0.7–4.0)
MCH: 31.5 pg (ref 26.0–34.0)
MCHC: 35.2 g/dL (ref 30.0–36.0)
MCV: 89.5 fL (ref 80.0–100.0)
Monocytes Absolute: 0.5 10*3/uL (ref 0.1–1.0)
Monocytes Relative: 8 %
Neutro Abs: 4.1 10*3/uL (ref 1.7–7.7)
Neutrophils Relative %: 69 %
Platelets: 323 10*3/uL (ref 150–400)
RBC: 3.33 MIL/uL — ABNORMAL LOW (ref 3.87–5.11)
RDW: 10.9 % — ABNORMAL LOW (ref 11.5–15.5)
WBC: 5.9 10*3/uL (ref 4.0–10.5)
nRBC: 0 % (ref 0.0–0.2)

## 2019-08-25 LAB — CMP (CANCER CENTER ONLY)
ALT: <6 U/L (ref 0–44)
AST: 16 U/L (ref 15–41)
Albumin: 4.1 g/dL (ref 3.5–5.0)
Alkaline Phosphatase: 52 U/L (ref 38–126)
Anion gap: 10 (ref 5–15)
BUN: 13 mg/dL (ref 8–23)
CO2: 26 mmol/L (ref 22–32)
Calcium: 9.4 mg/dL (ref 8.9–10.3)
Chloride: 97 mmol/L — ABNORMAL LOW (ref 98–111)
Creatinine: 0.84 mg/dL (ref 0.44–1.00)
GFR, Est AFR Am: >60 mL/min (ref >60)
GFR, Estimated: >60 mL/min (ref >60)
Glucose, Bld: 122 mg/dL — ABNORMAL HIGH (ref 70–99)
Potassium: 3.9 mmol/L (ref 3.5–5.1)
Sodium: 133 mmol/L — ABNORMAL LOW (ref 135–145)
Total Bilirubin: 0.4 mg/dL (ref 0.3–1.2)
Total Protein: 7.5 g/dL (ref 6.5–8.1)

## 2019-08-25 MED ORDER — ANAGRELIDE HCL 1 MG PO CAPS: ORAL_CAPSULE | ORAL | 5 refills | Status: DC

## 2019-08-28 ENCOUNTER — Telehealth: Payer: Self-pay | Admitting: Hematology

## 2019-08-28 NOTE — Telephone Encounter (Signed)
Appts were already scheduled per 2/26 los.

## 2019-09-19 ENCOUNTER — Ambulatory Visit: Payer: Medicare Other | Attending: Family

## 2019-09-19 DIAGNOSIS — Z23 Encounter for immunization: Secondary | ICD-10-CM

## 2019-09-19 NOTE — Progress Notes (Signed)
   Covid-19 Vaccination Clinic  Name:  Paula Griffith    MRN: [id number] DOB: [birth year]  09/19/2019  [name] was observed post Covid-19 immunization for 30 minutes based on pre-vaccination screening without incident. She was provided with Vaccine Information Sheet and instruction to access the V-Safe system.   [name] was instructed to call 911 with any severe reactions post vaccine: Marland Kitchen Difficulty breathing  . Swelling of face and throat  . A fast heartbeat  . A bad rash all over body  . Dizziness and weakness   Immunizations Administered    Name Date Dose VIS Date Route   Moderna COVID-19 Vaccine 09/19/2019  2:32 PM 0.5 mL 05/30/2019 Intramuscular   Manufacturer: Moderna   LotHQ:[id number]   [place]:[id number]

## 2019-10-09 ENCOUNTER — Other Ambulatory Visit: Payer: Self-pay | Admitting: Internal Medicine

## 2019-10-10 ENCOUNTER — Other Ambulatory Visit: Payer: Self-pay | Admitting: Internal Medicine

## 2019-10-23 ENCOUNTER — Other Ambulatory Visit: Payer: Self-pay

## 2019-10-23 ENCOUNTER — Encounter: Payer: Self-pay | Admitting: Internal Medicine

## 2019-10-23 ENCOUNTER — Ambulatory Visit (INDEPENDENT_AMBULATORY_CARE_PROVIDER_SITE_OTHER): Payer: Medicare Other | Admitting: Internal Medicine

## 2019-10-23 VITALS — BP 160/58 | HR 70 | Temp 98.1°F | Ht 62.0 in | Wt 140.0 lb

## 2019-10-23 DIAGNOSIS — H9190 Unspecified hearing loss, unspecified ear: Secondary | ICD-10-CM | POA: Insufficient documentation

## 2019-10-23 DIAGNOSIS — H9192 Unspecified hearing loss, left ear: Secondary | ICD-10-CM

## 2019-10-23 DIAGNOSIS — J301 Allergic rhinitis due to pollen: Secondary | ICD-10-CM

## 2019-10-23 DIAGNOSIS — H9312 Tinnitus, left ear: Secondary | ICD-10-CM
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
## 2019-10-24 ENCOUNTER — Encounter: Payer: Self-pay | Admitting: Internal Medicine

## 2019-10-24 NOTE — Assessment & Plan Note (Signed)
Prn antihistamines

## 2019-11-22 ENCOUNTER — Inpatient Hospital Stay: Payer: Medicare Other | Attending: Hematology

## 2019-11-22 ENCOUNTER — Other Ambulatory Visit: Payer: Self-pay

## 2019-11-22 DIAGNOSIS — D471 Chronic myeloproliferative disease: Secondary | ICD-10-CM

## 2019-11-22 LAB — CBC WITH DIFFERENTIAL/PLATELET
Abs Immature Granulocytes: 0.01 10*3/uL (ref 0.00–0.07)
Basophils Absolute: 0.1 10*3/uL (ref 0.0–0.1)
Basophils Relative: 1 %
Eosinophils Absolute: 0.1 10*3/uL (ref 0.0–0.5)
Eosinophils Relative: 1 %
HCT: 27.4 % — ABNORMAL LOW (ref 36.0–46.0)
Hemoglobin: 9.3 g/dL — ABNORMAL LOW (ref 12.0–15.0)
Immature Granulocytes: 0 %
Lymphocytes Relative: 22 %
Lymphs Abs: 1.3 10*3/uL (ref 0.7–4.0)
MCH: 31.3 pg (ref 26.0–34.0)
MCHC: 33.9 g/dL (ref 30.0–36.0)
MCV: 92.3 fL (ref 80.0–100.0)
Monocytes Absolute: 0.5 10*3/uL (ref 0.1–1.0)
Monocytes Relative: 9 %
Neutro Abs: 3.9 10*3/uL (ref 1.7–7.7)
Neutrophils Relative %: 67 %
Platelets: 513 10*3/uL — ABNORMAL HIGH (ref 150–400)
RBC: 2.97 MIL/uL — ABNORMAL LOW (ref 3.87–5.11)
RDW: 12 % (ref 11.5–15.5)
WBC: 5.8 10*3/uL (ref 4.0–10.5)
nRBC: 0 % (ref 0.0–0.2)

## 2019-11-22 LAB — CMP (CANCER CENTER ONLY)
ALT: <6 U/L (ref 0–44)
AST: 14 U/L — ABNORMAL LOW (ref 15–41)
Albumin: 3.9 g/dL (ref 3.5–5.0)
Alkaline Phosphatase: 49 U/L (ref 38–126)
Anion gap: 12 (ref 5–15)
BUN: 25 mg/dL — ABNORMAL HIGH (ref 8–23)
CO2: 24 mmol/L (ref 22–32)
Calcium: 9.6 mg/dL (ref 8.9–10.3)
Chloride: 102 mmol/L (ref 98–111)
Creatinine: 1.39 mg/dL — ABNORMAL HIGH (ref 0.44–1.00)
GFR, Est AFR Am: 43 mL/min — ABNORMAL LOW (ref >60)
GFR, Estimated: 37 mL/min — ABNORMAL LOW (ref >60)
Glucose, Bld: 135 mg/dL — ABNORMAL HIGH (ref 70–99)
Potassium: 3.5 mmol/L (ref 3.5–5.1)
Sodium: 138 mmol/L (ref 135–145)
Total Bilirubin: 0.4 mg/dL (ref 0.3–1.2)
Total Protein: 7.2 g/dL (ref 6.5–8.1)

## 2019-11-28 ENCOUNTER — Telehealth: Payer: Self-pay

## 2019-11-28 NOTE — Telephone Encounter (Signed)
I reviewed Dr. Lewayne Bunting comments regarding lab results with Paula Griffith.  I told her our schedulers will be calling her with hr appt date and time.  She verbalized understanding.

## 2019-11-28 NOTE — Telephone Encounter (Signed)
-----   Message from Truitt Merle, MD sent at 11/28/2019  8:51 AM EDT ----- Please let pt know her lab results, Cr increased, encourage her to drink fluids adequately, and avoid any NSAIDs. Plt also slightly high. Will continue current dose anagrelide, but move her lab lab and OV to 4 weeks from now thanks   [employer]  11/28/2019

## 2019-11-29 ENCOUNTER — Telehealth: Payer: Self-pay | Admitting: Hematology

## 2019-11-29 NOTE — Telephone Encounter (Signed)
Scheduled per 6/1 sch message. Pt aware of appts.

## 2019-12-14 NOTE — Progress Notes (Signed)
[place]   [phone] [phone]   Clinic Follow up Note   Patient Care Team: [name], [name], MD as PCP - General Angelia Mould, MD (Vascular Surgery) Ara Kussmaul, MD (Ophthalmology) Nahser, Wonda Cheng, MD as Consulting Physician (Cardiology) Truitt Merle, MD as Consulting Physician (Hematology) Eustace Moore, MD as Consulting Physician (Neurosurgery)  Date of Service:  12/26/2019  CHIEF COMPLAINT: F/u of MPN   CURRENT THERAPY:  1. Hydrea 500 mg Every day except 1,000 mg on M and Thurs. Decreased to 500mg  daily on 06/10/2015, and further decreased to 500mg  daily except M and Thursday due to anemia, stopped on 07/30/2015 2. Added anagrelide 0.5 mg twice daily on 06/10/2015, increased to 1mg  am 0.5mg  pm on 07/30/2015, and decreased to 0.5mg  daily on 09/03/2015, changed to anagrelide 0.5 mg twice daily on MWF, and 0.5mg  daily on the rest of week in 03/2016. Currently on 1mg  anagrelide daily for 5 days of the week. 3. Aspirin 325 mg daily.  INTERVAL HISTORY:  Paula Griffith is here for a follow up of MPN. She presents to the clinic alone. She notes she is doing well. She notes she is still busy at her church. She notes she did miss a dose or two due to delay in getting her Anagrelide refill. She is currently taking is 5 days a week. She is tolerating Anagrelide well. She notes SOB which she attributes to her asthma. She keeps inhaler with her. She notes some SOB last night and this morning.    REVIEW OF SYSTEMS:   Constitutional: Denies fevers, chills or abnormal weight loss Eyes: Denies blurriness of vision Ears, nose, mouth, throat, and face: Denies mucositis or sore throat Respiratory: Denies cough or wheezes (+) Mild SOB  Cardiovascular: Denies palpitation, chest discomfort or lower extremity swelling Gastrointestinal:  Denies nausea, heartburn or change in bowel habits Skin: Denies abnormal skin rashes Lymphatics: Denies new  lymphadenopathy or easy bruising Neurological:Denies numbness, tingling or new weaknesses Behavioral/Psych: Mood is stable, no new changes  All other systems were reviewed with the patient and are negative.  MEDICAL HISTORY:  Past Medical History:  Diagnosis Date  . Allergic rhinitis   . Asthma   . Cervicalgia   . Colon polyps   . Complication of anesthesia   . Diverticulosis of colon   . Glucose intolerance (impaired glucose tolerance)   . HTN (hypertension)   . LBP (low back pain)   . Leukemia ([place])   . Osteoarthritis   . PONV (postoperative nausea and vomiting)   . PVD (peripheral vascular disease) ([place]) 2011   Right Carotid Dr Scot Dock  . 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 Spondylolisthesis of lumbar region   . Thrombocytopathy [facility]) 2011   Dr Ralene Ok    SURGICAL HISTORY: Past Surgical History:  Procedure Laterality Date  . ABDOMINAL HYSTERECTOMY  1989  . ANTERIOR CERVICAL DECOMP/DISCECTOMY FUSION N/A 09/22/2017   Procedure: ANTERIOR CERVICAL DECOMPRESSION AND FUSION CERVICAL THREE-FOUR.;  Surgeon: Eustace Moore, MD;  Location: [place];  Service: Neurosurgery;  Laterality: N/A;  anterior  . BACK SURGERY    . BREAST EXCISIONAL BIOPSY Left   . BREAST SURGERY     Left  . LUMBAR LAMINECTOMY/DECOMPRESSION MICRODISCECTOMY Left 01/06/2018   Procedure: laminectomy Lumbar five -Sacral one - left, Lumbar two-Lumbar three - bilateral;  Surgeon: Eustace Moore, MD;  Location: [place];  Service: Neurosurgery;  Laterality: Left;    I have reviewed the social history and  family history with the patient and they are unchanged from previous note.  ALLERGIES:  is allergic to benazepril, amlodipine, other, codeine, and tramadol.  MEDICATIONS:  Current Outpatient Medications  Medication Sig Dispense Refill  . albuterol (PROAIR HFA) 108 (90 Base) MCG/ACT inhaler Inhale 2 puffs into the lungs every 4 (four) hours as needed. For shortness of breath 18 g 5  . anagrelide (AGRYLIN) 1 MG  capsule TAKE 1 CAPSULE (1 MG TOTAL) BY MOUTH DAILY EXCEPT 2 DAYS A WEEK DO NOT TAKE. 66 capsule 1  . Ascorbic Acid (VITAMIN C) 1000 MG tablet Take 1,000 mg by mouth daily.    Marland Kitchen aspirin 325 MG EC tablet Take 325 mg by mouth daily after breakfast.      . Biotin 10000 MCG TABS Take 10,000 mcg by mouth daily.    . carvedilol (COREG) 12.5 MG tablet Take 1 tablet (12.5 mg total) by mouth 2 (two) times daily with a meal. 180 tablet 3  . Cholecalciferol (VITAMIN D3) 2000 units TABS Take 2,000 Units by mouth daily.    . Fluticasone-Salmeterol (ADVAIR) 250-50 MCG/DOSE AEPB Please specify directions, refills and quantity 1 each 11  . furosemide (LASIX) 20 MG tablet TAKE 1-2 TABLETS (20-40 MG TOTAL) BY MOUTH DAILY AS NEEDED. 180 tablet 1  . gabapentin (NEURONTIN) 100 MG capsule TAKE 1 CAPSULE BY MOUTH THREE TIMES A DAY 270 capsule 3  . ibuprofen (ADVIL) 600 MG tablet Take 1 tablet (600 mg total) by mouth 2 (two) times daily as needed. 180 tablet 1  . methocarbamol (ROBAXIN) 500 MG tablet TAKE 1 TABLET (500 MG TOTAL) BY MOUTH EVERY 6 (SIX) HOURS AS NEEDED FOR MUSCLE SPASMS. 40 tablet 3  . Multiple Vitamins-Minerals (ADULT GUMMY PO) Take 2 tablets by mouth daily.    . potassium chloride (KLOR-CON) 10 MEQ tablet Take 1 tablet (10 mEq total) by mouth daily. 90 tablet 3  . trolamine salicylate (ASPERCREME) 10 % cream Apply 1 application topically 2 (two) times daily as needed for muscle pain.    . valsartan-hydrochlorothiazide (DIOVAN-HCT) 160-25 MG tablet Take 1 tablet by mouth daily. 90 tablet 3   No current facility-administered medications for this visit.    PHYSICAL EXAMINATION: ECOG PERFORMANCE STATUS: 0 - Asymptomatic  Vitals:   12/26/19 0916  BP: (!) 162/74  Pulse: 67  Resp: 20  Temp: 97.9 F (36.6 C)  SpO2: 100%   Filed Weights   12/26/19 0916  Weight: 136 lb 3.2 oz (61.8 kg)    Due to COVID19 we will limit examination to appearance. Patient had no complaints.  GENERAL:alert, no distress  and comfortable SKIN: skin color normal, no rashes or significant lesions EYES: normal, Conjunctiva are pink and non-injected, sclera clear  NEURO: alert & oriented x 3 with fluent speech   LABORATORY DATA:  I have reviewed the data as listed CBC Latest Ref Rng & Units 12/26/2019 11/22/2019 08/25/2019  WBC 4.0 - 10.5 K/uL 5.3 5.8 5.9  Hemoglobin 12.0 - 15.0 g/dL 9.7(L) 9.3(L) 10.5(L)  Hematocrit 36 - 46 % 28.4(L) 27.4(L) 29.8(L)  Platelets 150 - 400 K/uL 297 513(H) 323     CMP Latest Ref Rng & Units 12/26/2019 11/22/2019 08/25/2019  Glucose 70 - 99 mg/dL 122(H) 135(H) 122(H)  BUN 8 - 23 mg/dL 15 25(H) 13  Creatinine 0.44 - 1.00 mg/dL 0.88 1.39(H) 0.84  Sodium 135 - 145 mmol/L 134(L) 138 133(L)  Potassium 3.5 - 5.1 mmol/L 4.2 3.5 3.9  Chloride 98 - 111 mmol/L 101 102 97(L)  CO2 22 - 32 mmol/L 22 24 26   Calcium 8.9 - 10.3 mg/dL 9.4 9.6 9.4  Total Protein 6.5 - 8.1 g/dL 6.8 7.2 7.5  Total Bilirubin 0.3 - 1.2 mg/dL 0.4 0.4 0.4  Alkaline Phos 38 - 126 U/L 67 49 52  AST 15 - 41 U/L 19 14(L) 16  ALT 0 - 44 U/L 12 <6 <6      RADIOGRAPHIC STUDIES: I have personally reviewed the radiological images as listed and agreed with the findings in the report. No results found.   ASSESSMENT & PLAN:  Paula Griffith is a 74 y.o. female with   1. Myeloproliferative neoplasm-essential thrombocythemia, JAK2 mutation negative.  -We previously reviewed the natural history of ET, most patients do well, major complication is thrombosis. -She was on Hydrea beforestarting in 05/2015, I stoppedin 1/2017due to her anemia. -She was switched to anagrelide for disease controlin 05/2015. -Her anagrelide dose has been titratedas needed.We will monitor her EKG periodically.She is currently on Anagrelide 1mg  daily except Tuesdays/thursdays. -Her 11/22/19 labs showed plt increased 513K. Repeated labs today show normal plt at 297K. She notes she did miss some doses in May due to delay in getting her refill. I  encouraged her to watch for her refills.  -She will continue Anagrelide 1 mg daily for 5 days a week. She is tolerating well.  -f/u in6 months, with lab in 3 months   2. Arthritis and back pain, Fall -She will continue follow-up with her PCP and pain management clinic. -Back pain is stable and moderate,she has no limitation on her activities -She fell in 12/2018. She did hurt her left hand but with brace it has improved. She has an alert necklace she can use in case of emergency or Fall. -No recent falls.   3. Hypertension and other medical problems -She'll continue follow-up with her primary care physician -She has had mild dyspnea due to asthma last night and this morning. She carries her inhaler with her.  -Cr was increased at 1.39 on 11/22/19. I reviewed her medication list. Her Cr is normal again today (12/26/19).  -She has had Afib in recent years, currently resolved. I discussed Anagrelide have side effects on her heart function. I will repeat Echo, she is agreeable.    4. Anemia  -secondary to MPN and previous hydrea  -Mild and stable. Continue to monitor.  -I encouraged her to take multivitamin with iron.   Plan -Lab reviewed, plt normal, continue anagrelide 1 mg daily for 5 days a week. -Echo at [facility] in 1-2 weeks.  -Lab in 3 and 6 months, f/u in 6 months    No problem-specific Assessment & Plan notes found for this encounter.   Orders Placed This Encounter  Procedures  . ECHOCARDIOGRAM COMPLETE    Standing Status:   Future    Standing Expiration Date:   12/25/2020    Order Specific Question:   Where should this test be performed    Answer:   [place]    Order Specific Question:   Perflutren DEFINITY (image enhancing agent) should be administered unless hypersensitivity or allergy exist    Answer:   Administer Perflutren    Order Specific Question:   Is a special reader required? (athlete or structural heart)    Answer:   No    Order Specific Question:   Does  this study need to be read by the Structural team/Level 3 readers?    Answer:   No    Order Specific  Question:   Reason for exam-Echo    Answer:   Chemotherapy evaluation  v87.41 / v58.11   All questions were answered. The patient knows to call the clinic with any problems, questions or concerns. No barriers to learning was detected. The total time spent in the appointment was 25 minutes.     Truitt Merle, MD 12/26/2019   I, Joslyn Devon, am acting as scribe for Truitt Merle, MD.   I have reviewed the above documentation for accuracy and completeness, and I agree with the above.

## 2019-12-15 ENCOUNTER — Encounter: Payer: Self-pay | Admitting: Internal Medicine

## 2019-12-19 ENCOUNTER — Other Ambulatory Visit: Payer: Self-pay | Admitting: Internal Medicine

## 2019-12-19 ENCOUNTER — Other Ambulatory Visit: Payer: Self-pay | Admitting: Hematology

## 2019-12-19 DIAGNOSIS — D471 Chronic myeloproliferative disease: Secondary | ICD-10-CM

## 2019-12-26 ENCOUNTER — Inpatient Hospital Stay: Payer: Medicare Other

## 2019-12-26 ENCOUNTER — Encounter: Payer: Self-pay | Admitting: Hematology

## 2019-12-26 ENCOUNTER — Telehealth: Payer: Self-pay | Admitting: Hematology

## 2019-12-26 ENCOUNTER — Inpatient Hospital Stay: Payer: Medicare Other | Attending: Hematology | Admitting: Hematology

## 2019-12-26 ENCOUNTER — Other Ambulatory Visit: Payer: Self-pay

## 2019-12-26 VITALS — BP 162/74 | HR 67 | Temp 97.9°F | Resp 20 | Ht 62.0 in | Wt 136.2 lb

## 2019-12-26 DIAGNOSIS — Z7951 Long term (current) use of inhaled steroids: Secondary | ICD-10-CM | POA: Insufficient documentation

## 2019-12-26 DIAGNOSIS — Z791 Long term (current) use of non-steroidal anti-inflammatories (NSAID): Secondary | ICD-10-CM | POA: Insufficient documentation

## 2019-12-26 DIAGNOSIS — Z7982 Long term (current) use of aspirin: Secondary | ICD-10-CM | POA: Insufficient documentation

## 2019-12-26 DIAGNOSIS — D649 Anemia, unspecified: Secondary | ICD-10-CM | POA: Diagnosis not present

## 2019-12-26 DIAGNOSIS — D471 Chronic myeloproliferative disease: Secondary | ICD-10-CM

## 2019-12-26 DIAGNOSIS — D473 Essential (hemorrhagic) thrombocythemia: Secondary | ICD-10-CM | POA: Diagnosis not present

## 2019-12-26 DIAGNOSIS — I10 Essential (primary) hypertension: Secondary | ICD-10-CM | POA: Insufficient documentation

## 2019-12-26 DIAGNOSIS — Z79899 Other long term (current) drug therapy: Secondary | ICD-10-CM | POA: Diagnosis not present

## 2019-12-26 DIAGNOSIS — J45909 Unspecified asthma, uncomplicated: Secondary | ICD-10-CM | POA: Diagnosis not present

## 2019-12-26 LAB — CMP (CANCER CENTER ONLY)
ALT: 12 U/L (ref 0–44)
AST: 19 U/L (ref 15–41)
Albumin: 3.8 g/dL (ref 3.5–5.0)
Alkaline Phosphatase: 67 U/L (ref 38–126)
Anion gap: 11 (ref 5–15)
BUN: 15 mg/dL (ref 8–23)
CO2: 22 mmol/L (ref 22–32)
Calcium: 9.4 mg/dL (ref 8.9–10.3)
Chloride: 101 mmol/L (ref 98–111)
Creatinine: 0.88 mg/dL (ref 0.44–1.00)
GFR, Est AFR Am: >60 mL/min (ref >60)
GFR, Estimated: >60 mL/min (ref >60)
Glucose, Bld: 122 mg/dL — ABNORMAL HIGH (ref 70–99)
Potassium: 4.2 mmol/L (ref 3.5–5.1)
Sodium: 134 mmol/L — ABNORMAL LOW (ref 135–145)
Total Bilirubin: 0.4 mg/dL (ref 0.3–1.2)
Total Protein: 6.8 g/dL (ref 6.5–8.1)

## 2019-12-26 LAB — CBC WITH DIFFERENTIAL/PLATELET
Abs Immature Granulocytes: 0.01 10*3/uL (ref 0.00–0.07)
Basophils Absolute: 0 10*3/uL (ref 0.0–0.1)
Basophils Relative: 1 %
Eosinophils Absolute: 0 10*3/uL (ref 0.0–0.5)
Eosinophils Relative: 1 %
HCT: 28.4 % — ABNORMAL LOW (ref 36.0–46.0)
Hemoglobin: 9.7 g/dL — ABNORMAL LOW (ref 12.0–15.0)
Immature Granulocytes: 0 %
Lymphocytes Relative: 21 %
Lymphs Abs: 1.1 10*3/uL (ref 0.7–4.0)
MCH: 32.1 pg (ref 26.0–34.0)
MCHC: 34.2 g/dL (ref 30.0–36.0)
MCV: 94 fL (ref 80.0–100.0)
Monocytes Absolute: 0.4 10*3/uL (ref 0.1–1.0)
Monocytes Relative: 8 %
Neutro Abs: 3.7 10*3/uL (ref 1.7–7.7)
Neutrophils Relative %: 69 %
Platelets: 297 10*3/uL (ref 150–400)
RBC: 3.02 MIL/uL — ABNORMAL LOW (ref 3.87–5.11)
RDW: 11.7 % (ref 11.5–15.5)
WBC: 5.3 10*3/uL (ref 4.0–10.5)
nRBC: 0 % (ref 0.0–0.2)

## 2019-12-26 NOTE — Telephone Encounter (Signed)
Scheduled per 6/29 los. Printed avs and calendar for pt.  

## 2019-12-29 ENCOUNTER — Encounter: Payer: Self-pay | Admitting: Hematology

## 2020-01-09 ENCOUNTER — Telehealth: Payer: Self-pay

## 2020-01-09 ENCOUNTER — Other Ambulatory Visit: Payer: Self-pay

## 2020-01-09 ENCOUNTER — Ambulatory Visit (HOSPITAL_COMMUNITY)
Admission: RE | Admit: 2020-01-09 | Discharge: 2020-01-09 | Disposition: A | Discharge status: Home / Self Care | Payer: Medicare Other | Source: Ambulatory Visit | Attending: Hematology | Admitting: Hematology

## 2020-01-09 DIAGNOSIS — D471 Chronic myeloproliferative disease: Secondary | ICD-10-CM

## 2020-01-09 DIAGNOSIS — I10 Essential (primary) hypertension: Secondary | ICD-10-CM | POA: Insufficient documentation

## 2020-01-09 NOTE — Telephone Encounter (Signed)
Called patient and made her aware of Dr. Ernestina Penna message below. Patient verbalized understanding.

## 2020-01-09 NOTE — Progress Notes (Signed)
  Echocardiogram 2D Echocardiogram has been performed.  Paula Griffith [name] 01/09/2020, 10:58 AM

## 2020-01-09 NOTE — Telephone Encounter (Signed)
-----   Message from Truitt Merle, MD sent at 01/09/2020 11:25 AM EDT ----- Please let pt know her echo result, overall good, she has mild left ventricular hypertrophy and grade 1 diastolic dysfunction, which is related to her hypertension, overall no concerns. Thanks   Truitt Merle  01/09/2020

## 2020-02-01 IMAGING — MG DIGITAL SCREENING BILAT W/ TOMO W/ CAD
6 of 10 series · 6 of 30 positions shown · non-contrast
Comparison: Previous exam(s).

CLINICAL DATA: Screening.

EXAM:
DIGITAL SCREENING BILATERAL MAMMOGRAM WITH TOMO AND CAD

[R MLO synth-2D]
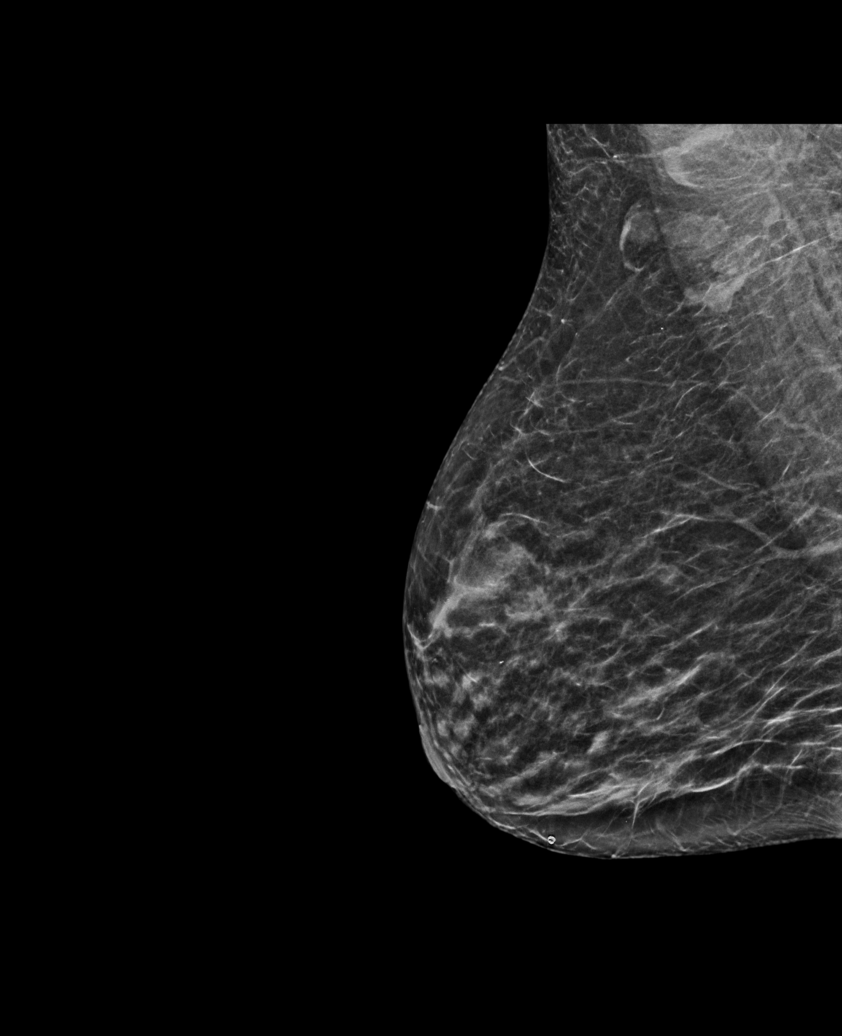

[R CC synth-2D (1 of 2)]
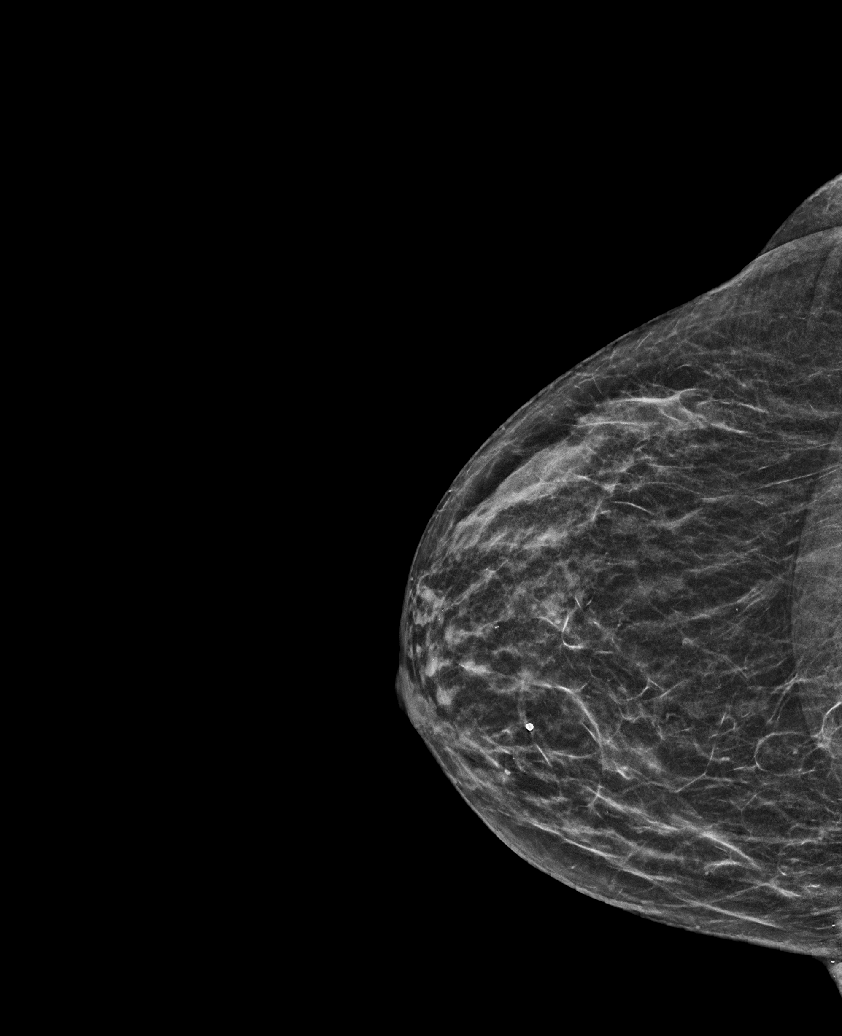

[R CC synth-2D (2 of 2)]
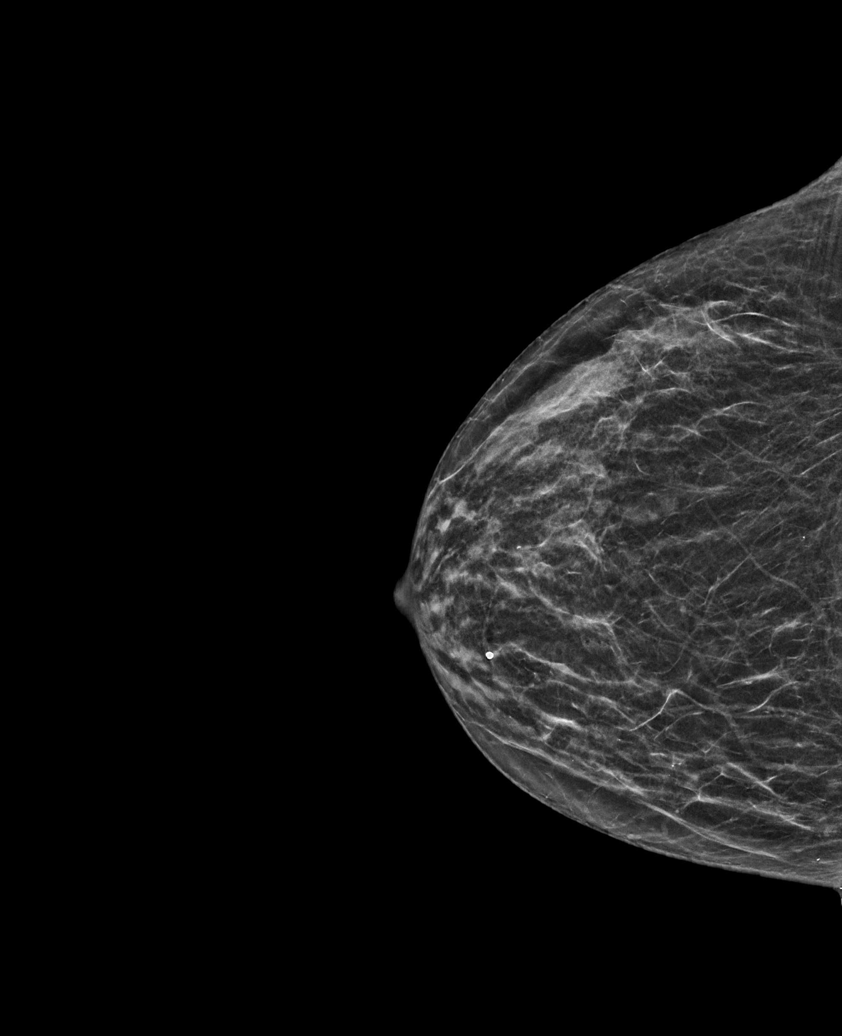

[L CC synth-2D]
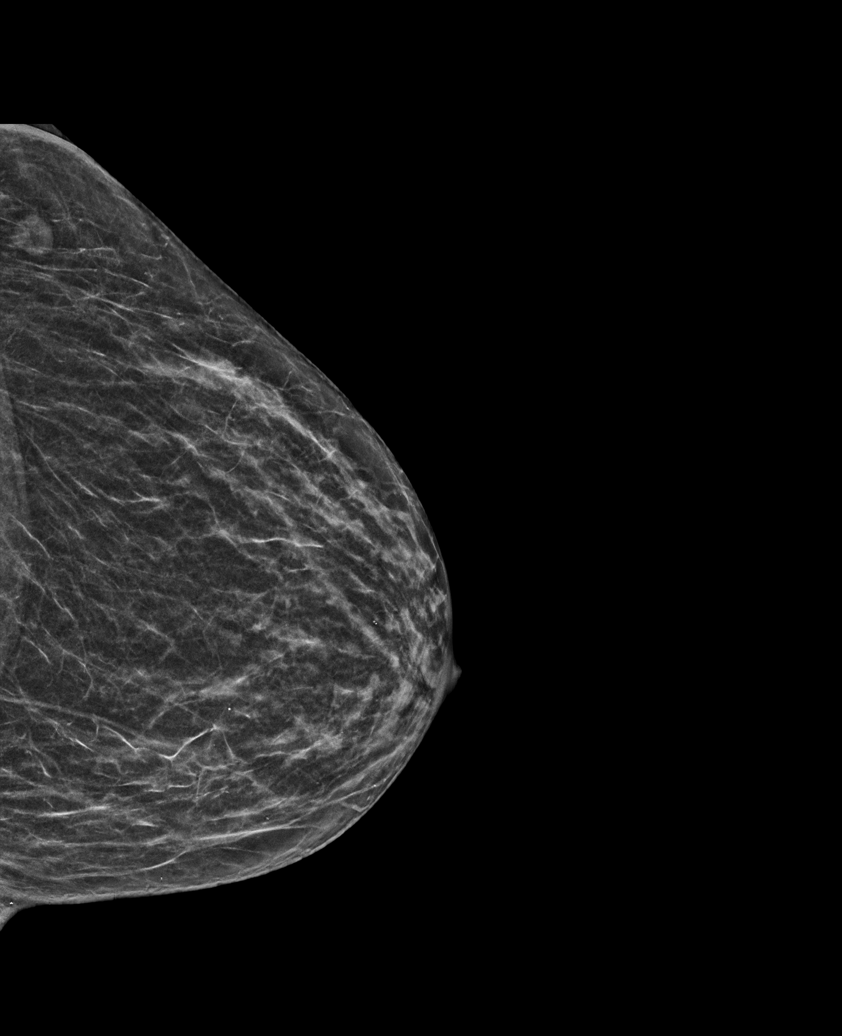

[L MLO synth-2D]
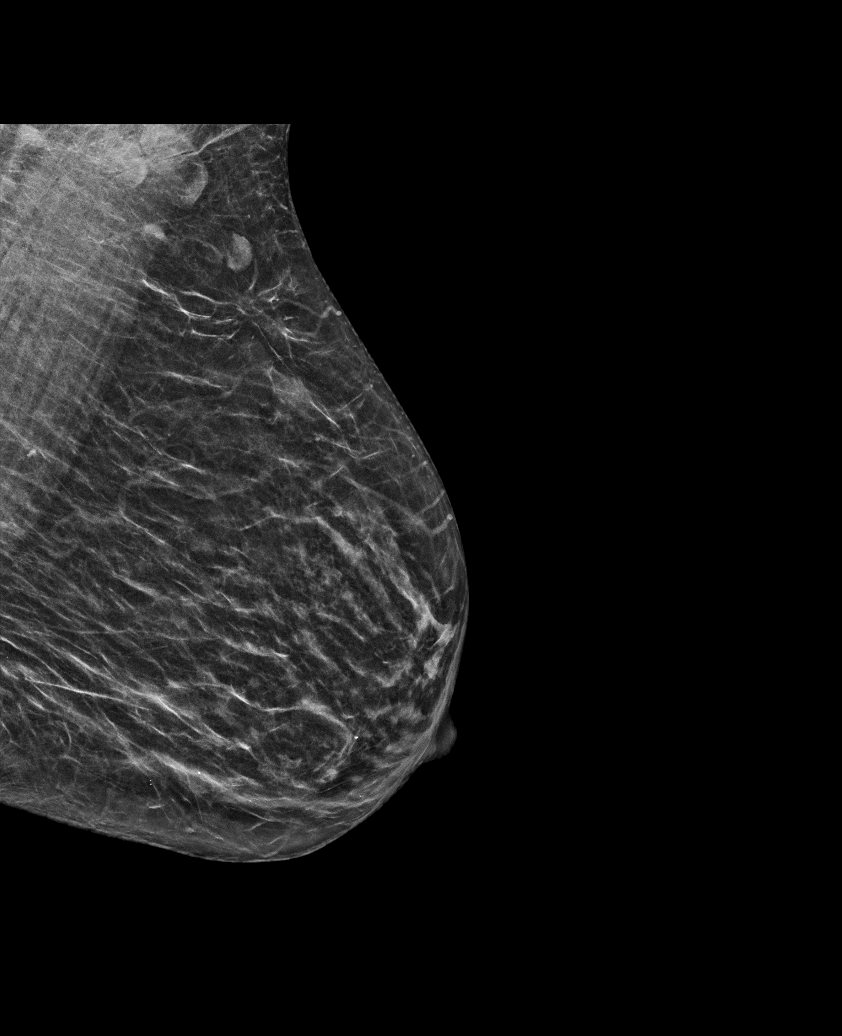

[R CC tomo · tomo slice 23/46.0]
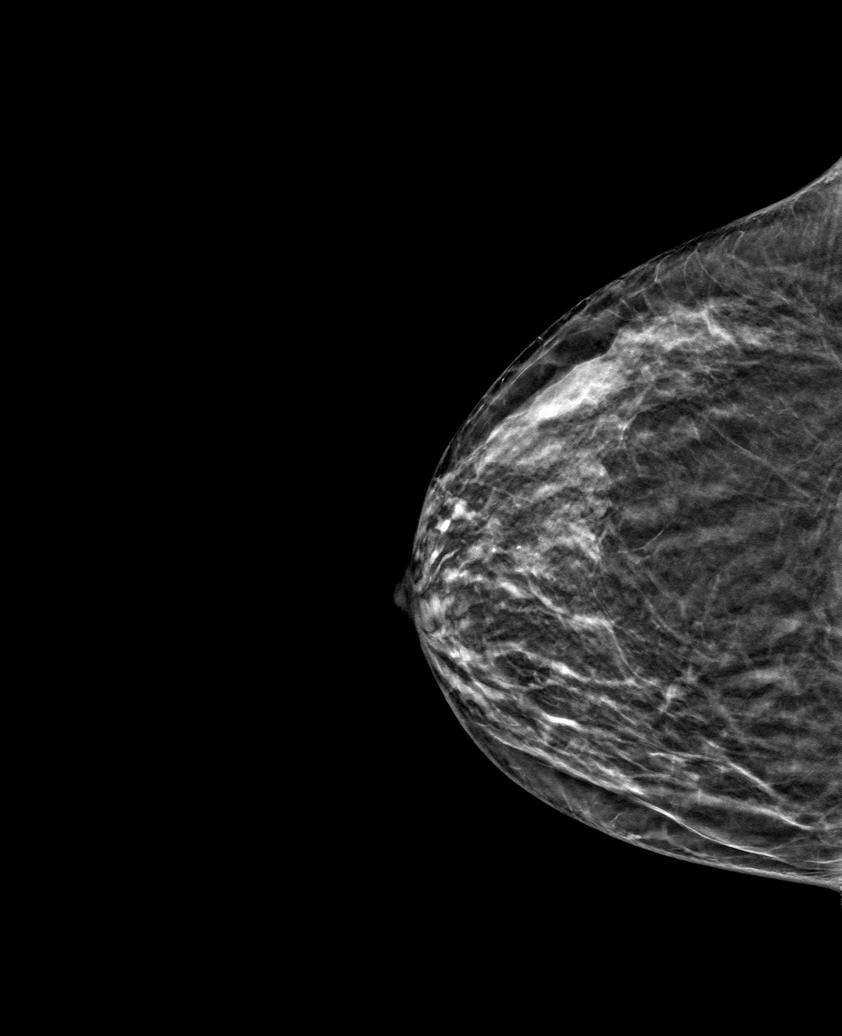

[6 of 30 positions shown; findings below may reference images not displayed]

ACR Breast Density Category b: There are scattered areas of
fibroglandular density.
FINDINGS: There are no findings suspicious for malignancy. Images were
processed with CAD.
IMPRESSION: No mammographic evidence of malignancy. A result letter of this
screening mammogram will be mailed directly to the patient.

RECOMMENDATION:
Screening mammogram in one year. (Code:[id number])

BI-RADS CATEGORY  1: Negative.

## 2020-02-22 ENCOUNTER — Telehealth: Payer: Self-pay | Admitting: Hematology

## 2020-02-22 ENCOUNTER — Ambulatory Visit: Payer: Medicare Other | Admitting: Hematology

## 2020-02-22 ENCOUNTER — Other Ambulatory Visit: Payer: Medicare Other

## 2020-02-22 NOTE — Telephone Encounter (Signed)
R/s appts on 12/29 to later that afternoon. YF in [facility]. Unable to reach pt and no voicemail set up. Mailing pt appt calendar.

## 2020-03-22 ENCOUNTER — Other Ambulatory Visit: Payer: Self-pay | Admitting: Internal Medicine

## 2020-03-27 ENCOUNTER — Other Ambulatory Visit: Payer: Self-pay

## 2020-03-27 ENCOUNTER — Inpatient Hospital Stay: Payer: Medicare Other | Attending: Hematology

## 2020-03-27 DIAGNOSIS — D471 Chronic myeloproliferative disease: Secondary | ICD-10-CM | POA: Diagnosis not present

## 2020-03-27 LAB — CMP (CANCER CENTER ONLY)
ALT: 10 U/L (ref 0–44)
AST: 21 U/L (ref 15–41)
Albumin: 3.9 g/dL (ref 3.5–5.0)
Alkaline Phosphatase: 65 U/L (ref 38–126)
Anion gap: 9 (ref 5–15)
BUN: 11 mg/dL (ref 8–23)
CO2: 25 mmol/L (ref 22–32)
Calcium: 9.3 mg/dL (ref 8.9–10.3)
Chloride: 101 mmol/L (ref 98–111)
Creatinine: 0.88 mg/dL (ref 0.44–1.00)
GFR, Est AFR Am: >60 mL/min (ref >60)
GFR, Estimated: >60 mL/min (ref >60)
Glucose, Bld: 152 mg/dL — ABNORMAL HIGH (ref 70–99)
Potassium: 3.7 mmol/L (ref 3.5–5.1)
Sodium: 135 mmol/L (ref 135–145)
Total Bilirubin: 0.5 mg/dL (ref 0.3–1.2)
Total Protein: 7.3 g/dL (ref 6.5–8.1)

## 2020-03-27 LAB — CBC WITH DIFFERENTIAL/PLATELET
Abs Immature Granulocytes: 0.01 10*3/uL (ref 0.00–0.07)
Basophils Absolute: 0 10*3/uL (ref 0.0–0.1)
Basophils Relative: 1 %
Eosinophils Absolute: 0 10*3/uL (ref 0.0–0.5)
Eosinophils Relative: 1 %
HCT: 31.4 % — ABNORMAL LOW (ref 36.0–46.0)
Hemoglobin: 10.6 g/dL — ABNORMAL LOW (ref 12.0–15.0)
Immature Granulocytes: 0 %
Lymphocytes Relative: 26 %
Lymphs Abs: 1.5 10*3/uL (ref 0.7–4.0)
MCH: 31.4 pg (ref 26.0–34.0)
MCHC: 33.8 g/dL (ref 30.0–36.0)
MCV: 92.9 fL (ref 80.0–100.0)
Monocytes Absolute: 0.5 10*3/uL (ref 0.1–1.0)
Monocytes Relative: 9 %
Neutro Abs: 3.7 10*3/uL (ref 1.7–7.7)
Neutrophils Relative %: 63 %
Platelets: 330 10*3/uL (ref 150–400)
RBC: 3.38 MIL/uL — ABNORMAL LOW (ref 3.87–5.11)
RDW: 11.8 % (ref 11.5–15.5)
WBC: 5.8 10*3/uL (ref 4.0–10.5)
nRBC: 0 % (ref 0.0–0.2)

## 2020-04-12 ENCOUNTER — Other Ambulatory Visit: Payer: Self-pay | Admitting: Internal Medicine

## 2020-04-12 DIAGNOSIS — Z1231 Encounter for screening mammogram for malignant neoplasm of breast: Secondary | ICD-10-CM

## 2020-05-03 ENCOUNTER — Telehealth: Payer: Self-pay | Admitting: Hematology

## 2020-05-03 NOTE — Telephone Encounter (Signed)
Rescheduled appts on 12/29 to 1/3. Provider on PAL. Pt is aware of appt times and date.

## 2020-05-13 ENCOUNTER — Ambulatory Visit: Payer: Medicare Other

## 2020-06-19 ENCOUNTER — Ambulatory Visit
Admission: RE | Admit: 2020-06-19 | Discharge: 2020-06-19 | Disposition: A | Discharge status: Home / Self Care | Payer: Medicare Other | Source: Ambulatory Visit | Attending: Internal Medicine | Admitting: Internal Medicine

## 2020-06-19 ENCOUNTER — Other Ambulatory Visit: Payer: Self-pay

## 2020-06-19 DIAGNOSIS — Z1231 Encounter for screening mammogram for malignant neoplasm of breast: Secondary | ICD-10-CM

## 2020-06-25 ENCOUNTER — Other Ambulatory Visit: Payer: Self-pay | Admitting: Internal Medicine

## 2020-06-25 DIAGNOSIS — R928 Other abnormal and inconclusive findings on diagnostic imaging of breast: Secondary | ICD-10-CM

## 2020-06-26 ENCOUNTER — Other Ambulatory Visit: Payer: Medicare Other

## 2020-06-26 ENCOUNTER — Ambulatory Visit: Payer: Medicare Other | Admitting: Hematology

## 2020-06-30 ENCOUNTER — Other Ambulatory Visit: Payer: Self-pay | Admitting: Hematology

## 2020-06-30 ENCOUNTER — Other Ambulatory Visit: Payer: Self-pay | Admitting: Internal Medicine

## 2020-06-30 DIAGNOSIS — D471 Chronic myeloproliferative disease: Secondary | ICD-10-CM

## 2020-07-01 ENCOUNTER — Inpatient Hospital Stay: Payer: Medicare Other | Admitting: Hematology

## 2020-07-01 ENCOUNTER — Inpatient Hospital Stay: Payer: Medicare Other | Attending: Hematology

## 2020-07-01 DIAGNOSIS — I10 Essential (primary) hypertension: Secondary | ICD-10-CM | POA: Insufficient documentation

## 2020-07-01 DIAGNOSIS — M1990 Unspecified osteoarthritis, unspecified site: Secondary | ICD-10-CM | POA: Insufficient documentation

## 2020-07-01 DIAGNOSIS — M549 Dorsalgia, unspecified: Secondary | ICD-10-CM | POA: Insufficient documentation

## 2020-07-01 DIAGNOSIS — D471 Chronic myeloproliferative disease: Secondary | ICD-10-CM

## 2020-07-01 DIAGNOSIS — D75839 Thrombocytosis, unspecified: Secondary | ICD-10-CM | POA: Insufficient documentation

## 2020-07-01 DIAGNOSIS — D649 Anemia, unspecified: Secondary | ICD-10-CM | POA: Insufficient documentation

## 2020-07-01 DIAGNOSIS — Z7982 Long term (current) use of aspirin: Secondary | ICD-10-CM | POA: Insufficient documentation

## 2020-07-01 DIAGNOSIS — J45909 Unspecified asthma, uncomplicated: Secondary | ICD-10-CM | POA: Insufficient documentation

## 2020-07-01 DIAGNOSIS — Z90710 Acquired absence of both cervix and uterus: Secondary | ICD-10-CM | POA: Insufficient documentation

## 2020-07-01 DIAGNOSIS — Z79899 Other long term (current) drug therapy: Secondary | ICD-10-CM | POA: Insufficient documentation

## 2020-07-01 NOTE — Progress Notes (Incomplete)
[facility]   Telephone:(336) [phone] Fax:(336) [phone]   Clinic Follow up Note   Patient Care Team: [name], Georgina Quint, MD as PCP - General Chuck Hint, MD (Vascular Surgery) Nadyne Coombes, MD (Ophthalmology) Nahser, Deloris Ping, MD as Consulting Physician (Cardiology) Malachy Mood, MD as Consulting Physician (Hematology) Tia Alert, MD as Consulting Physician (Neurosurgery)  Date of Service:  07/01/2020  CHIEF COMPLAINT: F/u of MPN   CURRENT THERAPY:  1. Hydrea 500 mg Every day except 1,000 mg on M and Thurs. Decreased to 500mg  daily on 06/10/2015, and further decreased to 500mg  daily except M and Thursday due to anemia, stopped on 07/30/2015 2. Added anagrelide 0.5 mg twice daily on 06/10/2015, increased to 1mg  am 0.5mg  pm on 07/30/2015, and decreased to 0.5mg  daily on 09/03/2015, changed to anagrelide 0.5 mg twice daily on MWF, and 0.5mg  daily on the rest of week in 03/2016. Currently on 1mg  anagrelide daily for 5 days of the week. 3. Aspirin 325 mg daily.   INTERVAL HISTORY: *** Paula Griffith is here for a follow up of MPN. She was last seen by me 6 months ago. She presents to the clinic alone.    REVIEW OF SYSTEMS:  *** Constitutional: Denies fevers, chills or abnormal weight loss Eyes: Denies blurriness of vision Ears, nose, mouth, throat, and face: Denies mucositis or sore throat Respiratory: Denies cough, dyspnea or wheezes Cardiovascular: Denies palpitation, chest discomfort or lower extremity swelling Gastrointestinal:  Denies nausea, heartburn or change in bowel habits Skin: Denies abnormal skin rashes Lymphatics: Denies new lymphadenopathy or easy bruising Neurological:Denies numbness, tingling or new weaknesses Behavioral/Psych: Mood is stable, no new changes  All other systems were reviewed with the patient and are negative.  MEDICAL HISTORY:  Past Medical History:  Diagnosis Date  . Allergic rhinitis   . Asthma   .  Cervicalgia   . Colon polyps   . Complication of anesthesia   . Diverticulosis of colon   . Glucose intolerance (impaired glucose tolerance)   . HTN (hypertension)   . LBP (low back pain)   . Leukemia ([facility])   . Osteoarthritis   . PONV (postoperative nausea and vomiting)   . PVD (peripheral vascular disease) ([facility]) 2011   Right Carotid Dr 04/2016  . 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  . Spondylolisthesis of lumbar region   . Thrombocytopathy [facility]) 2011   Dr Edilia Bo    SURGICAL HISTORY: Past Surgical History:  Procedure Laterality Date  . ABDOMINAL HYSTERECTOMY  1989  . ANTERIOR CERVICAL DECOMP/DISCECTOMY FUSION N/A 09/22/2017   Procedure: ANTERIOR CERVICAL DECOMPRESSION AND FUSION CERVICAL THREE-FOUR.;  Surgeon: [facility], MD;  Location: [facility] OR;  Service: Neurosurgery;  Laterality: N/A;  anterior  . BACK SURGERY    . BREAST EXCISIONAL BIOPSY Left   . BREAST SURGERY     Left  . LUMBAR LAMINECTOMY/DECOMPRESSION MICRODISCECTOMY Left 01/06/2018   Procedure: laminectomy Lumbar five -Sacral one - left, Lumbar two-Lumbar three - bilateral;  Surgeon: 09/24/2017, MD;  Location[facility] OR;  Service: Neurosurgery;  Laterality: Left;    I have reviewed the social history and family history with the patient and they are unchanged from previous note.  ALLERGIES:  is allergic to benazepril, amlodipine, other, codeine, and tramadol.  MEDICATIONS:  Current Outpatient Medications  Medication Sig Dispense Refill  . albuterol (PROAIR HFA) 108 (90 Base) MCG/ACT inhaler Inhale 2 puffs into the lungs every 4 (four) hours as needed. For shortness of breath 18  g 5  . anagrelide (AGRYLIN) 1 MG capsule TAKE 1 CAPSULE (1 MG TOTAL) BY MOUTH DAILY EXCEPT 2 DAYS A WEEK DO NOT TAKE. 66 capsule 1  . Ascorbic Acid (VITAMIN C) 1000 MG tablet Take 1,000 mg by mouth daily.    Marland Kitchen aspirin 325 MG EC tablet Take 325 mg by mouth daily after breakfast.      . Biotin 10000 MCG TABS Take 10,000 mcg by mouth daily.    .  carvedilol (COREG) 12.5 MG tablet TAKE 1 TABLET (12.5 MG TOTAL) BY MOUTH 2 (TWO) TIMES DAILY WITH A MEAL. 180 tablet 3  . Cholecalciferol (VITAMIN D3) 2000 units TABS Take 2,000 Units by mouth daily.    . Fluticasone-Salmeterol (ADVAIR) 250-50 MCG/DOSE AEPB Please specify directions, refills and quantity 1 each 11  . furosemide (LASIX) 20 MG tablet TAKE 1-2 TABLETS (20-40 MG TOTAL) BY MOUTH DAILY AS NEEDED. 180 tablet 1  . gabapentin (NEURONTIN) 100 MG capsule TAKE 1 CAPSULE BY MOUTH THREE TIMES A DAY 270 capsule 1  . ibuprofen (ADVIL) 600 MG tablet Take 1 tablet (600 mg total) by mouth 2 (two) times daily as needed. 180 tablet 1  . methocarbamol (ROBAXIN) 500 MG tablet TAKE 1 TABLET (500 MG TOTAL) BY MOUTH EVERY 6 (SIX) HOURS AS NEEDED FOR MUSCLE SPASMS. 40 tablet 3  . Multiple Vitamins-Minerals (ADULT GUMMY PO) Take 2 tablets by mouth daily.    . potassium chloride (KLOR-CON) 10 MEQ tablet Take 1 tablet (10 mEq total) by mouth daily. 90 tablet 3  . trolamine salicylate (ASPERCREME) 10 % cream Apply 1 application topically 2 (two) times daily as needed for muscle pain.    . valsartan-hydrochlorothiazide (DIOVAN-HCT) 160-25 MG tablet TAKE 1 TABLET BY MOUTH EVERY DAY 90 tablet 3   No current facility-administered medications for this visit.    PHYSICAL EXAMINATION: ECOG PERFORMANCE STATUS: {CHL ONC ECOG [phone]}  There were no vitals filed for this visit. There were no vitals filed for this visit. *** GENERAL:alert, no distress and comfortable SKIN: skin color, texture, turgor are normal, no rashes or significant lesions EYES: normal, Conjunctiva are pink and non-injected, sclera clear {OROPHARYNX:no exudate, no erythema and lips, buccal mucosa, and tongue normal}  NECK: supple, thyroid normal size, non-tender, without nodularity LYMPH:  no palpable lymphadenopathy in the cervical, axillary {or inguinal} LUNGS: clear to auscultation and percussion with normal breathing effort HEART:  regular rate & rhythm and no murmurs and no lower extremity edema ABDOMEN:abdomen soft, non-tender and normal bowel sounds Musculoskeletal:no cyanosis of digits and no clubbing  NEURO: alert & oriented x 3 with fluent speech, no focal motor/sensory deficits  LABORATORY DATA:  I have reviewed the data as listed CBC Latest Ref Rng & Units 03/27/2020 12/26/2019 11/22/2019  WBC 4.0 - 10.5 K/uL 5.8 5.3 5.8  Hemoglobin 12.0 - 15.0 g/dL 10.6(L) 9.7(L) 9.3(L)  Hematocrit 36.0 - 46.0 % 31.4(L) 28.4(L) 27.4(L)  Platelets 150 - 400 K/uL 330 297 513(H)     CMP Latest Ref Rng & Units 03/27/2020 12/26/2019 11/22/2019  Glucose 70 - 99 mg/dL 152(H) 122(H) 135(H)  BUN 8 - 23 mg/dL 11 15 25(H)  Creatinine 0.44 - 1.00 mg/dL 0.88 0.88 1.39(H)  Sodium 135 - 145 mmol/L 135 134(L) 138  Potassium 3.5 - 5.1 mmol/L 3.7 4.2 3.5  Chloride 98 - 111 mmol/L 101 101 102  CO2 22 - 32 mmol/L 25 22 24   Calcium 8.9 - 10.3 mg/dL 9.3 9.4 9.6  Total Protein 6.5 - 8.1 g/dL 7.3  6.8 7.2  Total Bilirubin 0.3 - 1.2 mg/dL 0.5 0.4 0.4  Alkaline Phos 38 - 126 U/L 65 67 49  AST 15 - 41 U/L 21 19 14(L)  ALT 0 - 44 U/L 10 12 <6      RADIOGRAPHIC STUDIES: I have personally reviewed the radiological images as listed and agreed with the findings in the report. No results found.   ASSESSMENT & PLAN:  KARTINA SWEETMAN is a 75 y.o. female with   1. Myeloproliferative neoplasm-essential thrombocythemia, JAK2 mutation negative.  -We previously reviewed the natural history of ET, most patients do well, major complication is thrombosis. -She was on Hydrea beforestarting in 05/2015, I stoppedin 1/2017due to her anemia. -She was switched to anagrelide for disease controlin 05/2015. -Her anagrelide dose has been titratedas needed.We will monitor her EKG periodically.She is currently on Anagrelide 1mg  daily except Tuesdays/thursdays. -Her 11/22/19 labs showed plt increased 513K. Repeated labs today show normal plt at 297K. She notes  she did miss some doses in May due to delay in getting her refill. I encouraged her to watch for her refills.  -She will continue Anagrelide1 mg daily for 5 days a week. She is tolerating well.  -f/u in6 months, with lab in 3 months   2. Arthritis and back pain, Fall -She will continue follow-up with her PCP and pain management clinic. -Back pain is stable and moderate,she has no limitation on her activities -She fell in 12/2018. She did hurt her left hand but with brace it has improved. She has an alert necklace she can use in case of emergency or Fall. -No recent falls.  3. Hypertension and other medical problems -She'll continue follow-up with her primary care physician -She has had mild dyspnea due to asthma last night and this morning. She carries her inhaler with her.  -Cr was increased at 1.39 on 11/22/19. I reviewed her medication list. Her Cr is normal again today (12/26/19).  -She has had Afib in recent years, currently resolved. I discussed Anagrelide have side effects on her heart function. I will repeat Echo, she is agreeable.    4. Anemia  -secondary to MPN and previous hydrea -Mild and stable. Continue to monitor.  -I encouraged her to take multivitamin with iron.  Plan -Lab reviewed, plt normal, continue anagrelide 1 mg daily for 5 days a week. -Echo at [facility] in 1-2 weeks.  -Lab in 3 and 6 months, f/u in 6 months    No problem-specific Assessment & Plan notes found for this encounter.   No orders of the defined types were placed in this encounter.  All questions were answered. The patient knows to call the clinic with any problems, questions or concerns. No barriers to learning was detected. The total time spent in the appointment was {CHL ONC TIME VISIT - ZX:[id number].     Paula Griffith 07/01/2020   Oneal Deputy, am acting as scribe for Truitt Merle, MD.   {Add scribe attestation statement}

## 2020-07-03 ENCOUNTER — Telehealth: Payer: Self-pay | Admitting: Hematology

## 2020-07-03 NOTE — Telephone Encounter (Signed)
Rescheduled appointment per 1/4 schedule message. Patient is aware of changes. 

## 2020-07-05 NOTE — Progress Notes (Signed)
[facility]   [phone] [phone]   Clinic Follow up Note   Patient Care Team: [name], [name], MD as PCP - General Angelia Mould, MD (Vascular Surgery) Ara Kussmaul, MD (Ophthalmology) Nahser, Wonda Cheng, MD as Consulting Physician (Cardiology) Truitt Merle, MD as Consulting Physician (Hematology) Eustace Moore, MD as Consulting Physician (Neurosurgery)  Date of Service:  07/08/2020  CHIEF COMPLAINT: F/u of MPN  CURRENT THERAPY:  1. Hydrea 500 mg Every day except 1,000 mg on M and Thurs. Decreased to 500mg  daily on 06/10/2015, and further decreased to 500mg  daily except M and Thursday due to anemia, stopped on 07/30/2015 2. Added anagrelide 0.5 mg twice daily on 06/10/2015, increased to 1mg  am 0.5mg  pm on 07/30/2015, and decreased to 0.5mg  daily on 09/03/2015, changed to anagrelide 0.5 mg twice daily on MWF, and 0.5mg  daily on the rest of week in 03/2016. Currently on 1mg  anagrelide daily for 5 days of the week. 3. Aspirin 325 mg daily.  INTERVAL HISTORY:  Paula Griffith is here for a follow up of MPN. She was last seen by me 7 months ago. She presents to the clinic alone. She notes she is doing well. She notes she did not do her usual morning routine given appointment. She notes she has been stressed from family issues and the likely reason of elevated BP today. She has not followed up with PCP recently. She notes bing SOB waking up hill to office today. She has inhaler. I reviewed medication list with her. She is still on anagrelide, but does not remember the dose. She does take it 5 days a week.   She notes she has received her COVID booster.    REVIEW OF SYSTEMS:   Constitutional: Denies fevers, chills or abnormal weight loss Eyes: Denies blurriness of vision Ears, nose, mouth, throat, and face: Denies mucositis or sore throat Respiratory: Denies cough, dyspnea or wheezes Cardiovascular: Denies palpitation, chest discomfort or  lower extremity swelling Gastrointestinal:  Denies nausea, heartburn or change in bowel habits Skin: Denies abnormal skin rashes Lymphatics: Denies new lymphadenopathy or easy bruising Neurological:Denies numbness, tingling or new weaknesses Behavioral/Psych: Mood is stable, no new changes  All other systems were reviewed with the patient and are negative.  MEDICAL HISTORY:  Past Medical History:  Diagnosis Date  . Allergic rhinitis   . Asthma   . Cervicalgia   . Colon polyps   . Complication of anesthesia   . Diverticulosis of colon   . Glucose intolerance (impaired glucose tolerance)   . HTN (hypertension)   . LBP (low back pain)   . Leukemia ([place])   . Osteoarthritis   . PONV (postoperative nausea and vomiting)   . PVD (peripheral vascular disease) ([place]) 2011   Right Carotid Dr Scot Dock  . 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 Spondylolisthesis of lumbar region   . Thrombocytopathy [facility]) 2011   Dr Ralene Ok    SURGICAL HISTORY: Past Surgical History:  Procedure Laterality Date  . ABDOMINAL HYSTERECTOMY  1989  . ANTERIOR CERVICAL DECOMP/DISCECTOMY FUSION N/A 09/22/2017   Procedure: ANTERIOR CERVICAL DECOMPRESSION AND FUSION CERVICAL THREE-FOUR.;  Surgeon: Eustace Moore, MD;  Location: [facility];  Service: Neurosurgery;  Laterality: N/A;  anterior  . BACK SURGERY    . BREAST EXCISIONAL BIOPSY Left   . BREAST SURGERY     Left  . LUMBAR LAMINECTOMY/DECOMPRESSION MICRODISCECTOMY Left 01/06/2018   Procedure: laminectomy Lumbar five -Sacral one - left, Lumbar two-Lumbar three -  bilateral;  Surgeon: Eustace Moore, MD;  Location: [facility];  Service: Neurosurgery;  Laterality: Left;    I have reviewed the social history and family history with the patient and they are unchanged from previous note.  ALLERGIES:  is allergic to benazepril, amlodipine, other, codeine, and tramadol.  MEDICATIONS:  Current Outpatient Medications  Medication Sig Dispense Refill  . albuterol (PROAIR HFA) 108  (90 Base) MCG/ACT inhaler Inhale 2 puffs into the lungs every 4 (four) hours as needed. For shortness of breath 18 g 5  . anagrelide (AGRYLIN) 1 MG capsule TAKE 1 CAPSULE (1 MG TOTAL) BY MOUTH DAILY EXCEPT 2 DAYS A WEEK DO NOT TAKE. 66 capsule 1  . Ascorbic Acid (VITAMIN C) 1000 MG tablet Take 1,000 mg by mouth daily.    Marland Kitchen aspirin 325 MG EC tablet Take 325 mg by mouth daily after breakfast.      . carvedilol (COREG) 12.5 MG tablet TAKE 1 TABLET (12.5 MG TOTAL) BY MOUTH 2 (TWO) TIMES DAILY WITH A MEAL. 180 tablet 3  . Cholecalciferol (VITAMIN D3) 2000 units TABS Take 2,000 Units by mouth daily.    . Fluticasone-Salmeterol (ADVAIR) 250-50 MCG/DOSE AEPB Please specify directions, refills and quantity 1 each 11  . furosemide (LASIX) 20 MG tablet TAKE 1-2 TABLETS (20-40 MG TOTAL) BY MOUTH DAILY AS NEEDED. 180 tablet 1  . gabapentin (NEURONTIN) 100 MG capsule TAKE 1 CAPSULE BY MOUTH THREE TIMES A DAY 270 capsule 1  . Multiple Vitamins-Minerals (ADULT GUMMY PO) Take 2 tablets by mouth daily.    . potassium chloride (KLOR-CON) 10 MEQ tablet Take 1 tablet (10 mEq total) by mouth daily. 90 tablet 3  . trolamine salicylate (ASPERCREME) 10 % cream Apply 1 application topically 2 (two) times daily as needed for muscle pain.    . valsartan-hydrochlorothiazide (DIOVAN-HCT) 160-25 MG tablet TAKE 1 TABLET BY MOUTH EVERY DAY 90 tablet 3   No current facility-administered medications for this visit.    PHYSICAL EXAMINATION: ECOG PERFORMANCE STATUS: 0 - Asymptomatic  Vitals:   07/08/20 1114  BP: (!) 170/68  Pulse: 66  Resp: 15  Temp: 98 F (36.7 C)  SpO2: 100%   Filed Weights   07/08/20 1114  Weight: 136 lb 8 oz (61.9 kg)    Due to COVID19 we will limit examination to appearance. Patient had no complaints.  GENERAL:alert, no distress and comfortable SKIN: skin color normal, no rashes or significant lesions EYES: normal, Conjunctiva are pink and non-injected, sclera clear  NEURO: alert & oriented x  3 with fluent speech   LABORATORY DATA:  I have reviewed the data as listed CBC Latest Ref Rng & Units 07/08/2020 03/27/2020 12/26/2019  WBC 4.0 - 10.5 K/uL 6.1 5.8 5.3  Hemoglobin 12.0 - 15.0 g/dL 9.6(L) 10.6(L) 9.7(L)  Hematocrit 36.0 - 46.0 % 27.8(L) 31.4(L) 28.4(L)  Platelets 150 - 400 K/uL 341 330 297     CMP Latest Ref Rng & Units 07/08/2020 03/27/2020 12/26/2019  Glucose 70 - 99 mg/dL 129(H) 152(H) 122(H)  BUN 8 - 23 mg/dL 15 11 15   Creatinine 0.44 - 1.00 mg/dL 0.96 0.88 0.88  Sodium 135 - 145 mmol/L 131(L) 135 134(L)  Potassium 3.5 - 5.1 mmol/L 4.0 3.7 4.2  Chloride 98 - 111 mmol/L 97(L) 101 101  CO2 22 - 32 mmol/L 25 25 22   Calcium 8.9 - 10.3 mg/dL 9.6 9.3 9.4  Total Protein 6.5 - 8.1 g/dL 7.3 7.3 6.8  Total Bilirubin 0.3 - 1.2 mg/dL 0.5 0.5  0.4  Alkaline Phos 38 - 126 U/L 61 65 67  AST 15 - 41 U/L 16 21 19   ALT 0 - 44 U/L 6 10 12       RADIOGRAPHIC STUDIES: I have personally reviewed the radiological images as listed and agreed with the findings in the report. No results found.   ASSESSMENT & PLAN:  Paula Griffith is a 75 y.o. female with   1. Myeloproliferative neoplasm-essential thrombocythemia, JAK2 mutation negative.  -We previously reviewed the natural history of ET, most patients do well, major complication is thrombosis. -She was on Hydrea beforestarting in 05/2015, I stoppedin 1/2017due to her anemia. -She was switched to anagrelide for disease controlin 05/2015. -Her anagrelide dose has been titratedas needed.We will monitor her EKG periodically.She is currently on Anagrelide 1mg  daily except Tuesdays/thursdays. -Labs reviewed, platelets normal, Hg 9.6 but given fluctuations, its stable. ET is overall controlled. She continues Anagrelide 5 days a week but doe snot remember if this is 0.5mg  or 1mg  pill. I advised she check at home and confirm with me. She is agreeable.  -Will continue Anagrelide 1mg  once daily 5 days a week  -Continue labs every 3  months and F/u in 6 months   2. Arthritis and back pain, Fall -She will continue follow-up with her PCP and pain management clinic. -Back pain is stable and moderate,she has no limitation on her activities -Last fall in 12/2018, no recent falls. She has an alert necklace she can use in case of emergency or Fall.  3. Hypertension and other medical problems -She'll continue follow-up with her primary care physician -She has had mild dyspnea due to asthma last night and this morning. She carries her inhaler with her.  -She has had Afib in recent years, currently resolved. I discussed Anagrelide have side effects on her heart function. Will monitor with [facility].   4. Anemia  -secondary to MPN and previous hydrea -Fluctuating, but in stable range. Continue to monitor.   Plan -Lab reviewed, plt normal, continue anagrelide 1 mg daily for 5 days a week. -Lab in 3 and 6 months -f/u in 6 months    No problem-specific Assessment & Plan notes found for this encounter.   No orders of the defined types were placed in this encounter.  All questions were answered. The patient knows to call the clinic with any problems, questions or concerns. No barriers to learning was detected. The total time spent in the appointment was 20 minutes.     Truitt Merle, MD 07/08/2020   I, Joslyn Devon, am acting as scribe for Truitt Merle, MD.   I have reviewed the above documentation for accuracy and completeness, and I agree with the above.

## 2020-07-08 ENCOUNTER — Encounter: Payer: Self-pay | Admitting: Hematology

## 2020-07-08 ENCOUNTER — Other Ambulatory Visit: Payer: Self-pay

## 2020-07-08 ENCOUNTER — Inpatient Hospital Stay: Payer: Medicare Other

## 2020-07-08 ENCOUNTER — Inpatient Hospital Stay (HOSPITAL_BASED_OUTPATIENT_CLINIC_OR_DEPARTMENT_OTHER): Payer: Medicare Other | Admitting: Hematology

## 2020-07-08 ENCOUNTER — Telehealth: Payer: Self-pay | Admitting: Hematology

## 2020-07-08 VITALS — BP 170/68 | HR 66 | Temp 98.0°F | Resp 15 | Wt 136.5 lb

## 2020-07-08 DIAGNOSIS — J45909 Unspecified asthma, uncomplicated: Secondary | ICD-10-CM | POA: Diagnosis not present

## 2020-07-08 DIAGNOSIS — D471 Chronic myeloproliferative disease: Secondary | ICD-10-CM

## 2020-07-08 DIAGNOSIS — Z90710 Acquired absence of both cervix and uterus: Secondary | ICD-10-CM | POA: Diagnosis not present

## 2020-07-08 DIAGNOSIS — I10 Essential (primary) hypertension: Secondary | ICD-10-CM | POA: Diagnosis not present

## 2020-07-08 DIAGNOSIS — M1990 Unspecified osteoarthritis, unspecified site: Secondary | ICD-10-CM | POA: Diagnosis not present

## 2020-07-08 DIAGNOSIS — Z7982 Long term (current) use of aspirin: Secondary | ICD-10-CM | POA: Diagnosis not present

## 2020-07-08 DIAGNOSIS — M549 Dorsalgia, unspecified: Secondary | ICD-10-CM | POA: Diagnosis not present

## 2020-07-08 DIAGNOSIS — D75839 Thrombocytosis, unspecified: Secondary | ICD-10-CM | POA: Diagnosis not present

## 2020-07-08 DIAGNOSIS — Z79899 Other long term (current) drug therapy: Secondary | ICD-10-CM | POA: Diagnosis not present

## 2020-07-08 DIAGNOSIS — D649 Anemia, unspecified: Secondary | ICD-10-CM | POA: Diagnosis not present

## 2020-07-08 LAB — CBC WITH DIFFERENTIAL/PLATELET
Abs Immature Granulocytes: 0.01 10*3/uL (ref 0.00–0.07)
Basophils Absolute: 0 10*3/uL (ref 0.0–0.1)
Basophils Relative: 1 %
Eosinophils Absolute: 0.1 10*3/uL (ref 0.0–0.5)
Eosinophils Relative: 1 %
HCT: 27.8 % — ABNORMAL LOW (ref 36.0–46.0)
Hemoglobin: 9.6 g/dL — ABNORMAL LOW (ref 12.0–15.0)
Immature Granulocytes: 0 %
Lymphocytes Relative: 30 %
Lymphs Abs: 1.8 10*3/uL (ref 0.7–4.0)
MCH: 32.5 pg (ref 26.0–34.0)
MCHC: 34.5 g/dL (ref 30.0–36.0)
MCV: 94.2 fL (ref 80.0–100.0)
Monocytes Absolute: 0.5 10*3/uL (ref 0.1–1.0)
Monocytes Relative: 9 %
Neutro Abs: 3.7 10*3/uL (ref 1.7–7.7)
Neutrophils Relative %: 59 %
Platelets: 341 10*3/uL (ref 150–400)
RBC: 2.95 MIL/uL — ABNORMAL LOW (ref 3.87–5.11)
RDW: 11.8 % (ref 11.5–15.5)
WBC: 6.1 10*3/uL (ref 4.0–10.5)
nRBC: 0 % (ref 0.0–0.2)

## 2020-07-08 LAB — CMP (CANCER CENTER ONLY)
ALT: <6 U/L (ref 0–44)
AST: 16 U/L (ref 15–41)
Albumin: 4 g/dL (ref 3.5–5.0)
Alkaline Phosphatase: 61 U/L (ref 38–126)
Anion gap: 9 (ref 5–15)
BUN: 15 mg/dL (ref 8–23)
CO2: 25 mmol/L (ref 22–32)
Calcium: 9.6 mg/dL (ref 8.9–10.3)
Chloride: 97 mmol/L — ABNORMAL LOW (ref 98–111)
Creatinine: 0.96 mg/dL (ref 0.44–1.00)
GFR, Estimated: >60 mL/min (ref >60)
Glucose, Bld: 129 mg/dL — ABNORMAL HIGH (ref 70–99)
Potassium: 4 mmol/L (ref 3.5–5.1)
Sodium: 131 mmol/L — ABNORMAL LOW (ref 135–145)
Total Bilirubin: 0.5 mg/dL (ref 0.3–1.2)
Total Protein: 7.3 g/dL (ref 6.5–8.1)

## 2020-07-08 NOTE — Telephone Encounter (Signed)
Scheduled appointments per 1/10 los. Spoke to patient who is aware of appointments dates and times.  

## 2020-07-11 ENCOUNTER — Ambulatory Visit
Admission: RE | Admit: 2020-07-11 | Discharge: 2020-07-11 | Disposition: A | Discharge status: Home / Self Care | Payer: BC Managed Care – PPO | Source: Ambulatory Visit | Attending: Internal Medicine | Admitting: Internal Medicine

## 2020-07-11 ENCOUNTER — Other Ambulatory Visit: Payer: Self-pay

## 2020-07-11 DIAGNOSIS — R928 Other abnormal and inconclusive findings on diagnostic imaging of breast: Secondary | ICD-10-CM

## 2020-08-13 ENCOUNTER — Other Ambulatory Visit: Payer: Self-pay

## 2020-08-14 ENCOUNTER — Ambulatory Visit (INDEPENDENT_AMBULATORY_CARE_PROVIDER_SITE_OTHER): Payer: Medicare Other | Admitting: Internal Medicine

## 2020-08-14 ENCOUNTER — Other Ambulatory Visit: Payer: Self-pay

## 2020-08-14 ENCOUNTER — Encounter: Payer: Self-pay | Admitting: Internal Medicine

## 2020-08-14 VITALS — BP 210/82 | HR 64 | Temp 98.4°F | Ht 62.0 in | Wt 137.8 lb

## 2020-08-14 DIAGNOSIS — D649 Anemia, unspecified: Secondary | ICD-10-CM | POA: Diagnosis not present

## 2020-08-14 DIAGNOSIS — F439 Reaction to severe stress, unspecified: Secondary | ICD-10-CM

## 2020-08-14 DIAGNOSIS — Z23 Encounter for immunization: Secondary | ICD-10-CM | POA: Diagnosis not present

## 2020-08-14 DIAGNOSIS — I10 Essential (primary) hypertension: Secondary | ICD-10-CM

## 2020-08-14 DIAGNOSIS — R739 Hyperglycemia, unspecified: Secondary | ICD-10-CM

## 2020-08-14 MED ORDER — ESCITALOPRAM OXALATE 5 MG PO TABS: 5.0000 mg | ORAL_TABLET | Freq: Every day | ORAL | 5 refills | Status: DC

## 2020-08-14 MED ORDER — VALSARTAN-HYDROCHLOROTHIAZIDE 320-25 MG PO TABS: 1.0000 | ORAL_TABLET | Freq: Every day | ORAL | 3 refills | Status: DC

## 2020-08-14 MED ORDER — CLONIDINE HCL 0.1 MG PO TABS: 0.1000 mg | ORAL_TABLET | Freq: Two times a day (BID) | ORAL | 3 refills | Status: DC

## 2020-08-14 NOTE — Assessment & Plan Note (Addendum)
A lot of stress w/her bipolar dtr 50  and autistic GS 9 who live w/her Start Lexapro

## 2020-08-14 NOTE — Assessment & Plan Note (Signed)
Check CBC 

## 2020-08-14 NOTE — Progress Notes (Signed)
Subjective:  Patient ID: Paula Griffith, female    DOB: 1946-05-05  Age: 75 y.o. MRN: [id number]  CC: Hypertension   HPI CHALONDA SCHLATTER presents for elevated BP C/o a lot of stress w/her bipolar dtr 29  and autistic GS 9 who live w/her.  Glory has to confined herself to her room due to constant conflict and argument. F/u elevated glucose GFR >60 Cecille Rubin has been on a good NAS diet all the time   Outpatient Medications Prior to Visit  Medication Sig Dispense Refill  . albuterol (PROAIR HFA) 108 (90 Base) MCG/ACT inhaler Inhale 2 puffs into the lungs every 4 (four) hours as needed. For shortness of breath 18 g 5  . anagrelide (AGRYLIN) 1 MG capsule TAKE 1 CAPSULE (1 MG TOTAL) BY MOUTH DAILY EXCEPT 2 DAYS A WEEK DO NOT TAKE. 66 capsule 1  . Ascorbic Acid (VITAMIN C) 1000 MG tablet Take 1,000 mg by mouth daily.    Marland Kitchen aspirin 325 MG EC tablet Take 325 mg by mouth daily after breakfast.    . carvedilol (COREG) 12.5 MG tablet TAKE 1 TABLET (12.5 MG TOTAL) BY MOUTH 2 (TWO) TIMES DAILY WITH A MEAL. 180 tablet 3  . Cholecalciferol (VITAMIN D3) 2000 units TABS Take 2,000 Units by mouth daily.    . Fluticasone-Salmeterol (ADVAIR) 250-50 MCG/DOSE AEPB Please specify directions, refills and quantity 1 each 11  . furosemide (LASIX) 20 MG tablet TAKE 1-2 TABLETS (20-40 MG TOTAL) BY MOUTH DAILY AS NEEDED. 180 tablet 1  . gabapentin (NEURONTIN) 100 MG capsule TAKE 1 CAPSULE BY MOUTH THREE TIMES A DAY 270 capsule 1  . Multiple Vitamins-Minerals (ADULT GUMMY PO) Take 2 tablets by mouth daily.    . potassium chloride (KLOR-CON) 10 MEQ tablet Take 1 tablet (10 mEq total) by mouth daily. 90 tablet 3  . trolamine salicylate (ASPERCREME) 10 % cream Apply 1 application topically 2 (two) times daily as needed for muscle pain.    . valsartan-hydrochlorothiazide (DIOVAN-HCT) 160-25 MG tablet TAKE 1 TABLET BY MOUTH EVERY DAY 90 tablet 3   No facility-administered medications prior to visit.    ROS: Review of  Systems  Constitutional: Negative for activity change, appetite change, chills, fatigue and unexpected weight change.  HENT: Negative for congestion, mouth sores and sinus pressure.   Eyes: Negative for visual disturbance.  Respiratory: Negative for cough and chest tightness.   Gastrointestinal: Negative for abdominal pain and nausea.  Genitourinary: Negative for difficulty urinating, frequency and vaginal pain.  Musculoskeletal: Positive for arthralgias. Negative for back pain and gait problem.  Skin: Negative for pallor and rash.  Neurological: Negative for dizziness, tremors, weakness, numbness and headaches.  Psychiatric/Behavioral: Negative for confusion, sleep disturbance and suicidal ideas. The patient is nervous/anxious.     Objective:  BP (!) 210/82 (BP Location: Left Arm)   Pulse 64   Temp 98.4 F (36.9 C) (Oral)   Ht 5\' 2"  (1.575 m)   Wt 137 lb 12.8 oz (62.5 kg)   SpO2 98%   BMI 25.20 kg/m   BP Readings from Last 3 Encounters:  08/14/20 (!) 210/82  07/08/20 (!) 170/68  12/26/19 (!) 162/74    Wt Readings from Last 3 Encounters:  08/14/20 137 lb 12.8 oz (62.5 kg)  07/08/20 136 lb 8 oz (61.9 kg)  12/26/19 136 lb 3.2 oz (61.8 kg)    Physical Exam Constitutional:      General: She is not in acute distress.    Appearance: She is well-developed.  HENT:     Head: Normocephalic.     Right Ear: External ear normal.     Left Ear: External ear normal.     Nose: Nose normal.     Mouth/Throat:     Mouth: Oropharynx is clear and moist.  Eyes:     General:        Right eye: No discharge.        Left eye: No discharge.     Conjunctiva/sclera: Conjunctivae normal.     Pupils: Pupils are equal, round, and reactive to light.  Neck:     Thyroid: No thyromegaly.     Vascular: No JVD.     Trachea: No tracheal deviation.  Cardiovascular:     Rate and Rhythm: Normal rate and regular rhythm.     Heart sounds: Normal heart sounds.  Pulmonary:     Effort: No respiratory  distress.     Breath sounds: No stridor. No wheezing.  Abdominal:     General: Bowel sounds are normal. There is no distension.     Palpations: Abdomen is soft. There is no mass.     Tenderness: There is no abdominal tenderness. There is no guarding or rebound.  Musculoskeletal:        General: No tenderness or edema.     Cervical back: Normal range of motion and neck supple.  Lymphadenopathy:     Cervical: No cervical adenopathy.  Skin:    Findings: No erythema or rash.  Neurological:     Mental Status: She is oriented to person, place, and time.     Cranial Nerves: No cranial nerve deficit.     Motor: No abnormal muscle tone.     Coordination: Coordination normal.     Deep Tendon Reflexes: Reflexes normal.  Psychiatric:        Mood and Affect: Mood and affect normal.        Behavior: Behavior normal.        Thought Content: Thought content normal.        Judgment: Judgment normal.     Lab Results  Component Value Date   WBC 6.1 07/08/2020   HGB 9.6 (L) 07/08/2020   HCT 27.8 (L) 07/08/2020   PLT 341 07/08/2020   GLUCOSE 129 (H) 07/08/2020   CHOL 158 03/01/2018   TRIG 84.0 03/01/2018   HDL 47.10 03/01/2018   LDLCALC 94 03/01/2018   ALT <6 07/08/2020   AST 16 07/08/2020   NA 131 (L) 07/08/2020   K 4.0 07/08/2020   CL 97 (L) 07/08/2020   CREATININE 0.96 07/08/2020   BUN 15 07/08/2020   CO2 25 07/08/2020   TSH 1.29 03/01/2018   INR 0.97 12/29/2017   HGBA1C 6.8 (H) 08/26/2017    US BREAST LTD UNI RIGHT INC AXILLA  Result Date: 07/11/2020 CLINICAL DATA:  The patient was called back for a possible mass in the right periareolar region. EXAM: ULTRASOUND OF THE RIGHT BREAST COMPARISON:  Previous exam(s). FINDINGS: On physical exam, no suspicious lumps are identified. Targeted ultrasound is performed, showing ductal ectasia in the periareolar region inferiorly and medially accounting for the mammographic finding. IMPRESSION: Ductal ectasia.  No evidence of malignancy.  RECOMMENDATION: Annual screening mammography. I have discussed the findings and recommendations with the patient. If applicable, a reminder letter will be sent to the patient regarding the next appointment. BI-RADS CATEGORY  2: Benign. Electronically Signed   By: Dorise Bullion III M.D   On: 07/11/2020 13:25    Assessment &  Plan:    Follow-up: No follow-ups on file.  [name], MD

## 2020-08-14 NOTE — Assessment & Plan Note (Signed)
Check A1c. 

## 2020-08-14 NOTE — Assessment & Plan Note (Addendum)
Worse due to stress Increase Diovan HCT to 320/25 daily Add Clonidine twice daily.  She can use more clonidine for blood pressure greater than 233 systolic.  We discussed the risk of stroke with uncontrolled blood pressure.  Will obtain c-Met, A1c, CBC before her next appointment in a week or two. Treat stress

## 2020-08-21 ENCOUNTER — Other Ambulatory Visit: Payer: Self-pay | Admitting: Internal Medicine

## 2020-08-27 ENCOUNTER — Encounter: Payer: Self-pay | Admitting: Internal Medicine

## 2020-08-27 ENCOUNTER — Other Ambulatory Visit: Payer: Self-pay

## 2020-08-27 ENCOUNTER — Ambulatory Visit (INDEPENDENT_AMBULATORY_CARE_PROVIDER_SITE_OTHER): Payer: Medicare Other | Admitting: Internal Medicine

## 2020-08-27 DIAGNOSIS — F439 Reaction to severe stress, unspecified: Secondary | ICD-10-CM | POA: Diagnosis not present

## 2020-08-27 DIAGNOSIS — R609 Edema, unspecified: Secondary | ICD-10-CM

## 2020-08-27 DIAGNOSIS — I10 Essential (primary) hypertension: Secondary | ICD-10-CM | POA: Diagnosis not present

## 2020-08-27 MED ORDER — CARVEDILOL 25 MG PO TABS: 25.0000 mg | ORAL_TABLET | Freq: Two times a day (BID) | ORAL | 11 refills | Status: DC

## 2020-08-27 NOTE — Patient Instructions (Signed)
You can try Lion's [place]ane Mushroom capsules for memory, focus, neuropathy ( [employer])    

## 2020-08-27 NOTE — Assessment & Plan Note (Signed)
Resolved Furposemide prn

## 2020-08-27 NOTE — Assessment & Plan Note (Signed)
Better: Diovan HCT  320/25 Clonidine twice daily. Continue Coreg - increase to 25 mg bid, Norvasc. RTC 2 mo

## 2020-08-27 NOTE — Progress Notes (Signed)
Subjective:  Patient ID: Paula Griffith, female    DOB: 10/05/1945  Age: 75 y.o. MRN: [id number]  CC: Follow-up (2 week f/u)   HPI Paula Griffith presents for HTN, stress, edema BP is better, SBP >170  Outpatient Medications Prior to Visit  Medication Sig Dispense Refill  . albuterol (PROAIR HFA) 108 (90 Base) MCG/ACT inhaler Inhale 2 puffs into the lungs every 4 (four) hours as needed. For shortness of breath 18 g 5  . amLODipine (NORVASC) 5 MG tablet TAKE 1 TABLET BY MOUTH EVERY DAY 90 tablet 3  . anagrelide (AGRYLIN) 1 MG capsule TAKE 1 CAPSULE (1 MG TOTAL) BY MOUTH DAILY EXCEPT 2 DAYS A WEEK DO NOT TAKE. 66 capsule 1  . Ascorbic Acid (VITAMIN C) 1000 MG tablet Take 1,000 mg by mouth daily.    Marland Kitchen aspirin 325 MG EC tablet Take 325 mg by mouth daily after breakfast.    . carvedilol (COREG) 12.5 MG tablet TAKE 1 TABLET (12.5 MG TOTAL) BY MOUTH 2 (TWO) TIMES DAILY WITH A MEAL. 180 tablet 3  . Cholecalciferol (VITAMIN D3) 2000 units TABS Take 2,000 Units by mouth daily.    . cloNIDine (CATAPRES) 0.1 MG tablet Take 1 tablet (0.1 mg total) by mouth 2 (two) times daily. 90 tablet 3  . escitalopram (LEXAPRO) 5 MG tablet Take 1 tablet (5 mg total) by mouth daily. 30 tablet 5  . Fluticasone-Salmeterol (ADVAIR) 250-50 MCG/DOSE AEPB Please specify directions, refills and quantity 1 each 11  . furosemide (LASIX) 20 MG tablet TAKE 1-2 TABLETS (20-40 MG TOTAL) BY MOUTH DAILY AS NEEDED. 180 tablet 1  . gabapentin (NEURONTIN) 100 MG capsule TAKE 1 CAPSULE BY MOUTH THREE TIMES A DAY 270 capsule 1  . Multiple Vitamins-Minerals (ADULT GUMMY PO) Take 2 tablets by mouth daily.    . potassium chloride (KLOR-CON) 10 MEQ tablet Take 1 tablet (10 mEq total) by mouth daily. 90 tablet 3  . trolamine salicylate (ASPERCREME) 10 % cream Apply 1 application topically 2 (two) times daily as needed for muscle pain.    . valsartan-hydrochlorothiazide (DIOVAN HCT) 320-25 MG tablet Take 1 tablet by mouth daily. 90 tablet 3    No facility-administered medications prior to visit.    ROS: Review of Systems  Constitutional: Negative for activity change, appetite change, chills, fatigue and unexpected weight change.  HENT: Negative for congestion, mouth sores and sinus pressure.   Eyes: Negative for visual disturbance.  Respiratory: Negative for cough and chest tightness.   Gastrointestinal: Negative for abdominal pain and nausea.  Genitourinary: Negative for difficulty urinating, frequency and vaginal pain.  Musculoskeletal: Negative for back pain and gait problem.  Skin: Negative for pallor and rash.  Neurological: Negative for dizziness, tremors, weakness, numbness and headaches.  Psychiatric/Behavioral: Positive for decreased concentration. Negative for confusion, sleep disturbance and suicidal ideas.    Objective:  BP (!) 172/70 (BP Location: Left Arm)   Pulse 60   Temp 99 F (37.2 C) (Oral)   SpO2 98%   BP Readings from Last 3 Encounters:  08/27/20 (!) 172/70  08/14/20 (!) 210/82  07/08/20 (!) 170/68    Wt Readings from Last 3 Encounters:  08/14/20 137 lb 12.8 oz (62.5 kg)  07/08/20 136 lb 8 oz (61.9 kg)  12/26/19 136 lb 3.2 oz (61.8 kg)    Physical Exam Constitutional:      General: She is not in acute distress.    Appearance: She is well-developed.  HENT:     Head:  Normocephalic.     Right Ear: External ear normal.     Left Ear: External ear normal.     Nose: Nose normal.     Mouth/Throat:     Mouth: Oropharynx is clear and moist.  Eyes:     General:        Right eye: No discharge.        Left eye: No discharge.     Conjunctiva/sclera: Conjunctivae normal.     Pupils: Pupils are equal, round, and reactive to light.  Neck:     Thyroid: No thyromegaly.     Vascular: No JVD.     Trachea: No tracheal deviation.  Cardiovascular:     Rate and Rhythm: Normal rate and regular rhythm.     Heart sounds: Normal heart sounds.  Pulmonary:     Effort: No respiratory distress.      Breath sounds: No stridor. No wheezing.  Abdominal:     General: Bowel sounds are normal. There is no distension.     Palpations: Abdomen is soft. There is no mass.     Tenderness: There is no abdominal tenderness. There is no guarding or rebound.  Musculoskeletal:        General: No tenderness or edema.     Cervical back: Normal range of motion and neck supple.  Lymphadenopathy:     Cervical: No cervical adenopathy.  Skin:    Findings: No erythema or rash.  Neurological:     Mental Status: She is oriented to person, place, and time.     Cranial Nerves: No cranial nerve deficit.     Motor: No abnormal muscle tone.     Coordination: Coordination normal.     Deep Tendon Reflexes: Reflexes normal.  Psychiatric:        Mood and Affect: Mood and affect normal.        Behavior: Behavior normal.        Thought Content: Thought content normal.        Judgment: Judgment normal.   leaning forward to walk  Lab Results  Component Value Date   WBC 6.1 07/08/2020   HGB 9.6 (L) 07/08/2020   HCT 27.8 (L) 07/08/2020   PLT 341 07/08/2020   GLUCOSE 129 (H) 07/08/2020   CHOL 158 03/01/2018   TRIG 84.0 03/01/2018   HDL 47.10 03/01/2018   LDLCALC 94 03/01/2018   ALT <6 07/08/2020   AST 16 07/08/2020   NA 131 (L) 07/08/2020   K 4.0 07/08/2020   CL 97 (L) 07/08/2020   CREATININE 0.96 07/08/2020   BUN 15 07/08/2020   CO2 25 07/08/2020   TSH 1.29 03/01/2018   INR 0.97 12/29/2017   HGBA1C 6.8 (H) 08/26/2017    US BREAST LTD UNI RIGHT INC AXILLA  Result Date: 07/11/2020 CLINICAL DATA:  The patient was called back for a possible mass in the right periareolar region. EXAM: ULTRASOUND OF THE RIGHT BREAST COMPARISON:  Previous exam(s). FINDINGS: On physical exam, no suspicious lumps are identified. Targeted ultrasound is performed, showing ductal ectasia in the periareolar region inferiorly and medially accounting for the mammographic finding. IMPRESSION: Ductal ectasia.  No evidence of  malignancy. RECOMMENDATION: Annual screening mammography. I have discussed the findings and recommendations with the patient. If applicable, a reminder letter will be sent to the patient regarding the next appointment. BI-RADS CATEGORY  2: Benign. Electronically Signed   By: Dorise Bullion III M.D   On: 07/11/2020 13:25    Assessment & Plan:  There are no diagnoses linked to this encounter.   No orders of the defined types were placed in this encounter.    Follow-up: No follow-ups on file.  [name], MD

## 2020-08-27 NOTE — Assessment & Plan Note (Addendum)
Cont Lexapro Try [facility]

## 2020-09-06 ENCOUNTER — Other Ambulatory Visit: Payer: Self-pay | Admitting: Internal Medicine

## 2020-10-03 ENCOUNTER — Telehealth: Payer: Self-pay | Admitting: Internal Medicine

## 2020-10-03 NOTE — Telephone Encounter (Signed)
Sister is not on DPR can not talk w/her concerning pt.Can either talk w/patient or daughters....Johny Chess

## 2020-10-03 NOTE — Telephone Encounter (Signed)
Hassan Rowan, the sister, has called the office saying the family is concerned about the patients mental health. Just wanted to talk to nurse.  Hassan [phone]

## 2020-10-06 ENCOUNTER — Other Ambulatory Visit: Payer: Self-pay | Admitting: Internal Medicine

## 2020-10-07 NOTE — Telephone Encounter (Signed)
1.Medication Requested: gabapentin (NEURONTIN) 100 MG capsule    2. Pharmacy (Name, Street, [place]): [id number] - [place], [place]  3. On Med List: yes   4. Last Visit with PCP: 08-27-20  5. Next visit date with PCP: 10-31-20   Agent: Please be advised that RX refills may take up to 3 business days. We ask that you follow-up with your pharmacy.

## 2020-10-10 ENCOUNTER — Other Ambulatory Visit: Payer: Self-pay

## 2020-10-10 ENCOUNTER — Inpatient Hospital Stay: Payer: Medicare Other | Attending: Hematology

## 2020-10-10 DIAGNOSIS — D471 Chronic myeloproliferative disease: Secondary | ICD-10-CM | POA: Insufficient documentation

## 2020-10-10 LAB — CMP (CANCER CENTER ONLY)
ALT: <6 U/L (ref 0–44)
AST: 16 U/L (ref 15–41)
Albumin: 4 g/dL (ref 3.5–5.0)
Alkaline Phosphatase: 68 U/L (ref 38–126)
Anion gap: 13 (ref 5–15)
BUN: 36 mg/dL — ABNORMAL HIGH (ref 8–23)
CO2: 22 mmol/L (ref 22–32)
Calcium: 8.9 mg/dL (ref 8.9–10.3)
Chloride: 104 mmol/L (ref 98–111)
Creatinine: 1.84 mg/dL — ABNORMAL HIGH (ref 0.44–1.00)
GFR, Estimated: 28 mL/min — ABNORMAL LOW (ref >60)
Glucose, Bld: 139 mg/dL — ABNORMAL HIGH (ref 70–99)
Potassium: 3.8 mmol/L (ref 3.5–5.1)
Sodium: 139 mmol/L (ref 135–145)
Total Bilirubin: 0.4 mg/dL (ref 0.3–1.2)
Total Protein: 7.1 g/dL (ref 6.5–8.1)

## 2020-10-10 LAB — CBC WITH DIFFERENTIAL/PLATELET
Abs Immature Granulocytes: 0.01 10*3/uL (ref 0.00–0.07)
Basophils Absolute: 0 10*3/uL (ref 0.0–0.1)
Basophils Relative: 1 %
Eosinophils Absolute: 0.1 10*3/uL (ref 0.0–0.5)
Eosinophils Relative: 1 %
HCT: 26.4 % — ABNORMAL LOW (ref 36.0–46.0)
Hemoglobin: 9 g/dL — ABNORMAL LOW (ref 12.0–15.0)
Immature Granulocytes: 0 %
Lymphocytes Relative: 24 %
Lymphs Abs: 1.4 10*3/uL (ref 0.7–4.0)
MCH: 31.9 pg (ref 26.0–34.0)
MCHC: 34.1 g/dL (ref 30.0–36.0)
MCV: 93.6 fL (ref 80.0–100.0)
Monocytes Absolute: 0.5 10*3/uL (ref 0.1–1.0)
Monocytes Relative: 9 %
Neutro Abs: 3.8 10*3/uL (ref 1.7–7.7)
Neutrophils Relative %: 65 %
Platelets: 214 10*3/uL (ref 150–400)
RBC: 2.82 MIL/uL — ABNORMAL LOW (ref 3.87–5.11)
RDW: 11.1 % — ABNORMAL LOW (ref 11.5–15.5)
WBC: 5.8 10*3/uL (ref 4.0–10.5)
nRBC: 0 % (ref 0.0–0.2)

## 2020-10-17 ENCOUNTER — Other Ambulatory Visit: Payer: Self-pay | Admitting: Internal Medicine

## 2020-10-18 ENCOUNTER — Telehealth: Payer: Self-pay | Admitting: Internal Medicine

## 2020-10-18 MED ORDER — VALSARTAN-HYDROCHLOROTHIAZIDE 320-25 MG PO TABS: 1.0000 | ORAL_TABLET | Freq: Every day | ORAL | 1 refills | Status: DC

## 2020-10-18 NOTE — Telephone Encounter (Signed)
Reviewed chart pt is up-to-date sent refills to [facility].../lmb  

## 2020-10-18 NOTE — Telephone Encounter (Signed)
valsartan-hydrochlorothiazide (DIOVAN HCT) 320-25 MG tablet [id number] [name], [place] - [address] Phone:  [phone]  Fax:  [phone]     Last seen- 03.01.22 Next-05.05.22

## 2020-10-28 ENCOUNTER — Telehealth: Payer: Self-pay

## 2020-10-28 NOTE — Telephone Encounter (Signed)
-----   Message from Truitt Merle, MD sent at 10/27/2020 10:47 AM EDT ----- Please let pt know her lab results, plt is normal, anemia stable, her Cr has increase significantly since 06/2020. Please let f/u with PCP, fax report to her PCP, thanks   Truitt Merle  10/27/2020

## 2020-10-28 NOTE — Telephone Encounter (Signed)
Called pt to review most recent labs 10/27/20 no answer message left for return call

## 2020-10-30 ENCOUNTER — Other Ambulatory Visit: Payer: Self-pay

## 2020-10-31 ENCOUNTER — Encounter: Payer: Self-pay | Admitting: Internal Medicine

## 2020-10-31 ENCOUNTER — Ambulatory Visit (INDEPENDENT_AMBULATORY_CARE_PROVIDER_SITE_OTHER): Payer: Medicare Other | Admitting: Internal Medicine

## 2020-10-31 DIAGNOSIS — R739 Hyperglycemia, unspecified: Secondary | ICD-10-CM | POA: Diagnosis not present

## 2020-10-31 DIAGNOSIS — I10 Essential (primary) hypertension: Secondary | ICD-10-CM

## 2020-10-31 DIAGNOSIS — R269 Unspecified abnormalities of gait and mobility: Secondary | ICD-10-CM | POA: Diagnosis not present

## 2020-10-31 DIAGNOSIS — Z981 Arthrodesis status: Secondary | ICD-10-CM | POA: Diagnosis not present

## 2020-10-31 NOTE — Assessment & Plan Note (Signed)
Using a cane 

## 2020-10-31 NOTE — Assessment & Plan Note (Signed)
SBP 137-140 at home

## 2020-10-31 NOTE — Progress Notes (Signed)
Subjective:  Patient ID: Paula Griffith, female    DOB: Aug 27, 1945  Age: 75 y.o. MRN: [id number]  CC: Follow-up (2 month f/u)   HPI DAVON FOLTA presents for HTN, anxiety, LBP, asthma SBP 137-140 at home  Outpatient Medications Prior to Visit  Medication Sig Dispense Refill  . albuterol (PROAIR HFA) 108 (90 Base) MCG/ACT inhaler Inhale 2 puffs into the lungs every 4 (four) hours as needed. For shortness of breath 18 g 5  . amLODipine (NORVASC) 5 MG tablet TAKE 1 TABLET BY MOUTH EVERY DAY 90 tablet 3  . anagrelide (AGRYLIN) 1 MG capsule TAKE 1 CAPSULE (1 MG TOTAL) BY MOUTH DAILY EXCEPT 2 DAYS A WEEK DO NOT TAKE. 66 capsule 1  . Ascorbic Acid (VITAMIN C) 1000 MG tablet Take 1,000 mg by mouth daily.    Marland Kitchen aspirin 325 MG EC tablet Take 325 mg by mouth daily after breakfast.    . carvedilol (COREG) 25 MG tablet Take 1 tablet (25 mg total) by mouth 2 (two) times daily. 60 tablet 11  . Cholecalciferol (VITAMIN D3) 2000 units TABS Take 2,000 Units by mouth daily.    . cloNIDine (CATAPRES) 0.1 MG tablet Take 1 tablet (0.1 mg total) by mouth 2 (two) times daily. 90 tablet 3  . escitalopram (LEXAPRO) 5 MG tablet TAKE 1 TABLET (5 MG TOTAL) BY MOUTH DAILY. 90 tablet 1  . Fluticasone-Salmeterol (ADVAIR) 250-50 MCG/DOSE AEPB Please specify directions, refills and quantity 1 each 11  . furosemide (LASIX) 20 MG tablet TAKE 1-2 TABLETS (20-40 MG TOTAL) BY MOUTH DAILY AS NEEDED. 180 tablet 1  . gabapentin (NEURONTIN) 100 MG capsule TAKE 1 CAPSULE BY MOUTH THREE TIMES A DAY 270 capsule 1  . Multiple Vitamins-Minerals (ADULT GUMMY PO) Take 2 tablets by mouth daily.    . potassium chloride (KLOR-CON) 10 MEQ tablet TAKE 1 TABLET BY MOUTH EVERY DAY 90 tablet 3  . trolamine salicylate (ASPERCREME) 10 % cream Apply 1 application topically 2 (two) times daily as needed for muscle pain.    . valsartan-hydrochlorothiazide (DIOVAN HCT) 320-25 MG tablet Take 1 tablet by mouth daily. 90 tablet 1   No  facility-administered medications prior to visit.    ROS: Review of Systems  Constitutional: Negative for activity change, appetite change, chills, fatigue and unexpected weight change.  HENT: Negative for congestion, mouth sores and sinus pressure.   Eyes: Negative for visual disturbance.  Respiratory: Negative for cough and chest tightness.   Gastrointestinal: Negative for abdominal pain and nausea.  Genitourinary: Negative for difficulty urinating, frequency and vaginal pain.  Musculoskeletal: Positive for arthralgias. Negative for back pain and gait problem.  Skin: Negative for pallor and rash.  Neurological: Negative for dizziness, tremors, weakness, numbness and headaches.  Psychiatric/Behavioral: Negative for confusion and sleep disturbance. The patient is not nervous/anxious.     Objective:  BP (!) 168/68 (BP Location: Left Arm)   Pulse 70   Temp 98.6 F (37 C) (Oral)   Wt 134 lb 1.9 oz (60.8 kg)   SpO2 99%   BMI 24.53 kg/m   BP Readings from Last 3 Encounters:  10/31/20 (!) 168/68  08/27/20 (!) 172/70  08/14/20 (!) 210/82    Wt Readings from Last 3 Encounters:  10/31/20 134 lb 1.9 oz (60.8 kg)  08/14/20 137 lb 12.8 oz (62.5 kg)  07/08/20 136 lb 8 oz (61.9 kg)    Physical Exam Constitutional:      General: She is not in acute distress.  Appearance: She is well-developed.  HENT:     Head: Normocephalic.     Right Ear: External ear normal.     Left Ear: External ear normal.     Nose: Nose normal.  Eyes:     General:        Right eye: No discharge.        Left eye: No discharge.     Conjunctiva/sclera: Conjunctivae normal.     Pupils: Pupils are equal, round, and reactive to light.  Neck:     Thyroid: No thyromegaly.     Vascular: No JVD.     Trachea: No tracheal deviation.  Cardiovascular:     Rate and Rhythm: Normal rate and regular rhythm.     Heart sounds: Normal heart sounds.  Pulmonary:     Effort: No respiratory distress.     Breath sounds:  No stridor. No wheezing.  Abdominal:     General: Bowel sounds are normal. There is no distension.     Palpations: Abdomen is soft. There is no mass.     Tenderness: There is no abdominal tenderness. There is no guarding or rebound.  Musculoskeletal:        General: No tenderness.     Cervical back: Normal range of motion and neck supple.  Lymphadenopathy:     Cervical: No cervical adenopathy.  Skin:    Findings: No erythema or rash.  Neurological:     Mental Status: She is oriented to person, place, and time.     Cranial Nerves: No cranial nerve deficit.     Motor: No abnormal muscle tone.     Coordination: Coordination normal.     Deep Tendon Reflexes: Reflexes normal.  Psychiatric:        Behavior: Behavior normal.        Thought Content: Thought content normal.        Judgment: Judgment normal.   antalgic gait OA def joints  Lab Results  Component Value Date   WBC 5.8 10/10/2020   HGB 9.0 (L) 10/10/2020   HCT 26.4 (L) 10/10/2020   PLT 214 10/10/2020   GLUCOSE 139 (H) 10/10/2020   CHOL 158 03/01/2018   TRIG 84.0 03/01/2018   HDL 47.10 03/01/2018   LDLCALC 94 03/01/2018   ALT <6 10/10/2020   AST 16 10/10/2020   NA 139 10/10/2020   K 3.8 10/10/2020   CL 104 10/10/2020   CREATININE 1.84 (H) 10/10/2020   BUN 36 (H) 10/10/2020   CO2 22 10/10/2020   TSH 1.29 03/01/2018   INR 0.97 12/29/2017   HGBA1C 6.8 (H) 08/26/2017    US BREAST LTD UNI RIGHT INC AXILLA  Result Date: 07/11/2020 CLINICAL DATA:  The patient was called back for a possible mass in the right periareolar region. EXAM: ULTRASOUND OF THE RIGHT BREAST COMPARISON:  Previous exam(s). FINDINGS: On physical exam, no suspicious lumps are identified. Targeted ultrasound is performed, showing ductal ectasia in the periareolar region inferiorly and medially accounting for the mammographic finding. IMPRESSION: Ductal ectasia.  No evidence of malignancy. RECOMMENDATION: Annual screening mammography. I have discussed  the findings and recommendations with the patient. If applicable, a reminder letter will be sent to the patient regarding the next appointment. BI-RADS CATEGORY  2: Benign. Electronically Signed   By: Dorise Bullion III M.D   On: 07/11/2020 13:25    Assessment & Plan:   There are no diagnoses linked to this encounter.   No orders of the defined types were placed  in this encounter.    Follow-up: No follow-ups on file.  [name], MD

## 2020-10-31 NOTE — Assessment & Plan Note (Signed)
Pt is disabled from work

## 2020-10-31 NOTE — Assessment & Plan Note (Signed)
Check A1c. 

## 2020-11-04 ENCOUNTER — Telehealth: Payer: Self-pay | Admitting: *Deleted

## 2020-11-04 NOTE — Telephone Encounter (Signed)
MD completed disability form. Called pt inform her form is ready for pick-yp. Place in cabinet up front for p/u.Marland KitchenJohny Chess

## 2020-11-22 ENCOUNTER — Other Ambulatory Visit: Payer: Self-pay | Admitting: Internal Medicine

## 2021-01-09 ENCOUNTER — Inpatient Hospital Stay: Payer: Medicare Other | Attending: Hematology

## 2021-01-09 ENCOUNTER — Other Ambulatory Visit: Payer: Self-pay

## 2021-01-09 ENCOUNTER — Inpatient Hospital Stay: Payer: Medicare Other | Admitting: Hematology

## 2021-01-09 ENCOUNTER — Encounter: Payer: Self-pay | Admitting: Hematology

## 2021-01-09 DIAGNOSIS — D649 Anemia, unspecified: Secondary | ICD-10-CM | POA: Diagnosis not present

## 2021-01-09 DIAGNOSIS — Z9181 History of falling: Secondary | ICD-10-CM | POA: Insufficient documentation

## 2021-01-09 DIAGNOSIS — M545 Low back pain, unspecified: Secondary | ICD-10-CM | POA: Diagnosis not present

## 2021-01-09 DIAGNOSIS — Z90710 Acquired absence of both cervix and uterus: Secondary | ICD-10-CM | POA: Insufficient documentation

## 2021-01-09 DIAGNOSIS — J45909 Unspecified asthma, uncomplicated: Secondary | ICD-10-CM | POA: Insufficient documentation

## 2021-01-09 DIAGNOSIS — Z856 Personal history of leukemia: Secondary | ICD-10-CM | POA: Diagnosis not present

## 2021-01-09 DIAGNOSIS — D471 Chronic myeloproliferative disease: Secondary | ICD-10-CM | POA: Diagnosis not present

## 2021-01-09 DIAGNOSIS — D75839 Thrombocytosis, unspecified: Secondary | ICD-10-CM | POA: Insufficient documentation

## 2021-01-09 DIAGNOSIS — M1990 Unspecified osteoarthritis, unspecified site: Secondary | ICD-10-CM | POA: Diagnosis not present

## 2021-01-09 DIAGNOSIS — I10 Essential (primary) hypertension: Secondary | ICD-10-CM | POA: Diagnosis not present

## 2021-01-09 DIAGNOSIS — Z79899 Other long term (current) drug therapy: Secondary | ICD-10-CM | POA: Diagnosis not present

## 2021-01-09 LAB — CMP (CANCER CENTER ONLY)
ALT: <6 U/L (ref 0–44)
AST: 18 U/L (ref 15–41)
Albumin: 3.9 g/dL (ref 3.5–5.0)
Alkaline Phosphatase: 66 U/L (ref 38–126)
Anion gap: 11 (ref 5–15)
BUN: 17 mg/dL (ref 8–23)
CO2: 25 mmol/L (ref 22–32)
Calcium: 9.4 mg/dL (ref 8.9–10.3)
Chloride: 98 mmol/L (ref 98–111)
Creatinine: 0.92 mg/dL (ref 0.44–1.00)
GFR, Estimated: >60 mL/min (ref >60)
Glucose, Bld: 99 mg/dL (ref 70–99)
Potassium: 4 mmol/L (ref 3.5–5.1)
Sodium: 134 mmol/L — ABNORMAL LOW (ref 135–145)
Total Bilirubin: 0.4 mg/dL (ref 0.3–1.2)
Total Protein: 7.2 g/dL (ref 6.5–8.1)

## 2021-01-09 LAB — CBC WITH DIFFERENTIAL/PLATELET
Abs Immature Granulocytes: 0.01 10*3/uL (ref 0.00–0.07)
Basophils Absolute: 0 10*3/uL (ref 0.0–0.1)
Basophils Relative: 1 %
Eosinophils Absolute: 0.1 10*3/uL (ref 0.0–0.5)
Eosinophils Relative: 1 %
HCT: 28.6 % — ABNORMAL LOW (ref 36.0–46.0)
Hemoglobin: 10.1 g/dL — ABNORMAL LOW (ref 12.0–15.0)
Immature Granulocytes: 0 %
Lymphocytes Relative: 31 %
Lymphs Abs: 1.6 10*3/uL (ref 0.7–4.0)
MCH: 32 pg (ref 26.0–34.0)
MCHC: 35.3 g/dL (ref 30.0–36.0)
MCV: 90.5 fL (ref 80.0–100.0)
Monocytes Absolute: 0.6 10*3/uL (ref 0.1–1.0)
Monocytes Relative: 11 %
Neutro Abs: 2.9 10*3/uL (ref 1.7–7.7)
Neutrophils Relative %: 56 %
Platelets: 310 10*3/uL (ref 150–400)
RBC: 3.16 MIL/uL — ABNORMAL LOW (ref 3.87–5.11)
RDW: 11.1 % — ABNORMAL LOW (ref 11.5–15.5)
WBC: 5.2 10*3/uL (ref 4.0–10.5)
nRBC: 0 % (ref 0.0–0.2)

## 2021-01-09 MED ORDER — ANAGRELIDE HCL 1 MG PO CAPS: ORAL_CAPSULE | ORAL | 1 refills | Status: DC

## 2021-01-09 NOTE — Progress Notes (Signed)
[facility] [facility]   [phone] [phone]   Clinic Follow up Note   Patient Care Team: [name], [name], MD as PCP - General Angelia Mould, MD (Vascular Surgery) Ara Kussmaul, MD (Ophthalmology) Nahser, Wonda Cheng, MD as Consulting Physician (Cardiology) Truitt Merle, MD as Consulting Physician (Hematology) Eustace Moore, MD as Consulting Physician (Neurosurgery)  Date of Service:  01/09/2021  CHIEF COMPLAINT: f/u of MPN  CURRENT THERAPY:  1. Hydrea 500 mg  Every day except 1,000 mg on M and Thurs. Decreased to 500mg  daily on 06/10/2015, and further decreased to 500mg  daily except M and Thursday due to anemia, stopped on 07/30/2015 2. Added anagrelide 0.5 mg twice daily on 06/10/2015, increased to 1mg  am 0.5mg  pm on 07/30/2015, and decreased to 0.5mg  daily on 09/03/2015, changed to anagrelide 0.5 mg twice daily on MWF, and 0.5mg  daily on the rest of week in 03/2016. Currently on 1mg  anagrelide daily for 5 days of the week.   3. Aspirin 325 mg daily.  INTERVAL HISTORY:  Paula Griffith is here for a follow up of 07/08/20. She was last seen by me on 07/08/20. She presents to the clinic alone. She denies any new issues from the anagrelide. She notes the parking lot was packed, and she had to park close to [employer].  She notes living with her daughter (she is bipolar) and grandson (he has autism). She tries to help them and offered to pay for a house, but her daughter refused this offer. Her daughter previously worked in Publishing rights manager. She is currently not working because she doesn't have anyone to watch her son Marland Kitchenhe can be a handful"). 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   All other systems were reviewed with the patient and are negative.  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   Spondylolisthesis of lumbar region    Thrombocytopathy [facility]) 2011   Dr Ralene Ok    SURGICAL HISTORY: Past Surgical History:  Procedure Laterality Date   ABDOMINAL HYSTERECTOMY  1989   ANTERIOR CERVICAL DECOMP/DISCECTOMY FUSION N/A 09/22/2017   Procedure: ANTERIOR CERVICAL DECOMPRESSION AND FUSION CERVICAL THREE-FOUR.;  Surgeon: Eustace Moore, MD;  Location: [place];  Service: Neurosurgery;  Laterality: N/A;  anterior   BACK SURGERY     BREAST EXCISIONAL BIOPSY Left    BREAST SURGERY     Left   LUMBAR LAMINECTOMY/DECOMPRESSION MICRODISCECTOMY Left 01/06/2018   Procedure: laminectomy Lumbar five -Sacral one - left, Lumbar two-Lumbar three - bilateral;  Surgeon: Eustace Moore, MD;  Location: [place];  Service: Neurosurgery;  Laterality: Left;    I have reviewed the social history and family history with the patient and they are unchanged from previous note.  ALLERGIES:  is allergic to benazepril, amlodipine, other, codeine, and tramadol.  MEDICATIONS:  Current Outpatient Medications  Medication Sig Dispense Refill   albuterol (PROAIR HFA) 108 (90 Base) MCG/ACT inhaler Inhale  2 puffs into the lungs every 4 (four) hours as needed. For shortness of breath 18 g 5   amLODipine (NORVASC) 5 MG tablet TAKE 1 TABLET BY MOUTH EVERY DAY 90 tablet 3   anagrelide (AGRYLIN) 1 MG capsule TAKE 1 CAPSULE (1 MG TOTAL) BY MOUTH DAILY EXCEPT 2 DAYS A WEEK (TUESDAYS AND THURSDAYS) DO NOT TAKE. 66 capsule 1   Ascorbic Acid (VITAMIN C) 1000 MG tablet Take 1,000 mg by mouth daily.     aspirin 325 MG EC tablet Take 325 mg by mouth daily after breakfast.     carvedilol (COREG) 25 MG tablet Take 1  tablet (25 mg total) by mouth 2 (two) times daily. 60 tablet 11   Cholecalciferol (VITAMIN D3) 2000 units TABS Take 2,000 Units by mouth daily.     cloNIDine (CATAPRES) 0.1 MG tablet TAKE 1 TABLET BY MOUTH 2 TIMES DAILY. 180 tablet 1   escitalopram (LEXAPRO) 5 MG tablet TAKE 1 TABLET (5 MG TOTAL) BY MOUTH DAILY. 90 tablet 1   Fluticasone-Salmeterol (ADVAIR) 250-50 MCG/DOSE AEPB Please specify directions, refills and quantity 1 each 11   furosemide (LASIX) 20 MG tablet TAKE 1-2 TABLETS (20-40 MG TOTAL) BY MOUTH DAILY AS NEEDED. 180 tablet 1   gabapentin (NEURONTIN) 100 MG capsule TAKE 1 CAPSULE BY MOUTH THREE TIMES A DAY 270 capsule 1   Multiple Vitamins-Minerals (ADULT GUMMY PO) Take 2 tablets by mouth daily.     potassium chloride (KLOR-CON) 10 MEQ tablet TAKE 1 TABLET BY MOUTH EVERY DAY 90 tablet 3   trolamine salicylate (ASPERCREME) 10 % cream Apply 1 application topically 2 (two) times daily as needed for muscle pain.     valsartan-hydrochlorothiazide (DIOVAN HCT) 320-25 MG tablet Take 1 tablet by mouth daily. 90 tablet 1   No current facility-administered medications for this visit.    PHYSICAL EXAMINATION: ECOG PERFORMANCE STATUS: 1 - Symptomatic but completely ambulatory  Vitals:   01/09/21 1127  BP: (!) 158/66  Pulse: 66  Resp: 18  Temp: 99.2 F (37.3 C)  SpO2: 100%   Filed Weights   01/09/21 1127  Weight: 132 lb 12.8 oz (60.2 kg)    GENERAL:alert, no distress and comfortable SKIN: skin color, texture, turgor are normal, no rashes or significant lesions EYES: normal, Conjunctiva are pink and non-injected, sclera clear  NECK: supple, thyroid normal size, non-tender, without nodularity LYMPH:  no palpable lymphadenopathy in the cervical, axillary  LUNGS: clear to auscultation and percussion with normal breathing effort HEART: regular rate & rhythm and no murmurs and no lower extremity edema ABDOMEN:abdomen soft, non-tender and normal bowel sounds Musculoskeletal:no  cyanosis of digits and no clubbing  NEURO: alert & oriented x 3 with fluent speech, no focal motor/sensory deficits  LABORATORY DATA:  I have reviewed the data as listed CBC Latest Ref Rng & Units 01/09/2021 10/10/2020 07/08/2020  WBC 4.0 - 10.5 K/uL 5.2 5.8 6.1  Hemoglobin 12.0 - 15.0 g/dL 10.1(L) 9.0(L) 9.6(L)  Hematocrit 36.0 - 46.0 % 28.6(L) 26.4(L) 27.8(L)  Platelets 150 - 400 K/uL 310 214 341     CMP Latest Ref Rng & Units 10/10/2020 07/08/2020 03/27/2020  Glucose 70 - 99 mg/dL 139(H) 129(H) 152(H)  BUN 8 - 23 mg/dL 36(H) 15 11  Creatinine 0.44 - 1.00 mg/dL 1.84(H) 0.96 0.88  Sodium 135 - 145 mmol/L 139 131(L) 135  Potassium 3.5 - 5.1 mmol/L 3.8 4.0 3.7  Chloride 98 - 111 mmol/L 104 97(L) 101  CO2 22 - 32 mmol/L 22 25 25  Calcium 8.9 - 10.3 mg/dL 8.9 9.6 9.3  Total Protein 6.5 - 8.1 g/dL 7.1 7.3 7.3  Total Bilirubin 0.3 - 1.2 mg/dL 0.4 0.5 0.5  Alkaline Phos 38 - 126 U/L 68 61 65  AST 15 - 41 U/L 16 16 21   ALT 0 - 44 U/L <6 <6 10      RADIOGRAPHIC STUDIES: I have personally reviewed the radiological images as listed and agreed with the findings in the report. No results found.   ASSESSMENT & PLAN:  Paula Griffith is a 75 y.o. female with   1. Myeloproliferative neoplasm-essential thrombocythemia, JAK2 mutation negative. -We previously reviewed the natural history of ET, most patients do well, major complication is thrombosis. -She was on Hydrea before starting in 05/2015, I stopped in 06/2015 due to her anemia. -She was switched to anagrelide for disease control in 05/2015.  -Her anagrelide dose has been titrated as needed. We will monitor her EKG periodically. She is currently on Anagrelide 1mg  daily except Tuesdays/thursdays.  -Labs reviewed, platelets normal, Hgb 10.1 (overall stable). Will continue Anagrelide 1mg  once daily 5 days a week -Continue labs every 3 months and F/u in 6 months    2. Arthritis and back pain, Fall   -She will continue follow-up with her PCP  and pain management clinic. -Back pain is stable and moderate, she has no limitation on her activities  -Last fall in 12/2018, no recent falls. She has an alert necklace she can use in case of emergency or Fall.    3. Hypertension and other medical problems -She'll continue follow-up with her primary care physician -She has had mild dyspnea due to asthma last night and this morning. She carries her inhaler with her. -She has had Afib in recent years, currently resolved. I discussed Anagrelide have side effects on her heart function. Will monitor with [facility].    4. Anemia -secondary to MPN and previous hydrea  -Fluctuating, but in stable range. Continue to monitor.    Plan -Continue anagrelide 1 mg daily for 5 days a week. -Lab in 3 and 6 months -f/u in 6 months     No problem-specific Assessment & Plan notes found for this encounter.   No orders of the defined types were placed in this encounter.  All questions were answered. The patient knows to call the clinic with any problems, questions or concerns. No barriers to learning was detected. The total time spent in the appointment was 20 minutes.     Truitt Merle, MD 01/09/2021   I, Wilburn Mylar, am acting as scribe for Truitt Merle, MD.   I have reviewed the above documentation for accuracy and completeness, and I agree with the above.

## 2021-03-04 ENCOUNTER — Ambulatory Visit: Payer: BC Managed Care – PPO | Admitting: Internal Medicine

## 2021-03-10 ENCOUNTER — Ambulatory Visit (INDEPENDENT_AMBULATORY_CARE_PROVIDER_SITE_OTHER): Payer: Medicare Other | Admitting: Internal Medicine

## 2021-03-10 ENCOUNTER — Encounter: Payer: Self-pay | Admitting: Internal Medicine

## 2021-03-10 ENCOUNTER — Other Ambulatory Visit: Payer: Self-pay

## 2021-03-10 VITALS — BP 152/62 | HR 69 | Temp 98.1°F | Ht 62.0 in | Wt 135.0 lb

## 2021-03-10 DIAGNOSIS — F439 Reaction to severe stress, unspecified: Secondary | ICD-10-CM

## 2021-03-10 DIAGNOSIS — R739 Hyperglycemia, unspecified: Secondary | ICD-10-CM | POA: Diagnosis not present

## 2021-03-10 DIAGNOSIS — J4531 Mild persistent asthma with (acute) exacerbation: Secondary | ICD-10-CM | POA: Diagnosis not present

## 2021-03-10 DIAGNOSIS — R269 Unspecified abnormalities of gait and mobility: Secondary | ICD-10-CM | POA: Diagnosis not present

## 2021-03-10 DIAGNOSIS — Z23 Encounter for immunization: Secondary | ICD-10-CM

## 2021-03-10 LAB — COMPREHENSIVE METABOLIC PANEL
ALT: 4 U/L (ref 0–35)
AST: 11 U/L (ref 0–37)
Albumin: 3.9 g/dL (ref 3.5–5.2)
Alkaline Phosphatase: 58 U/L (ref 39–117)
BUN: 12 mg/dL (ref 6–23)
CO2: 26 mEq/L (ref 19–32)
Calcium: 8.9 mg/dL (ref 8.4–10.5)
Chloride: 94 mEq/L — ABNORMAL LOW (ref 96–112)
Creatinine, Ser: 0.76 mg/dL (ref 0.40–1.20)
GFR: 76.66 mL/min (ref >60.00)
Glucose, Bld: 98 mg/dL (ref 70–99)
Potassium: 3.8 mEq/L (ref 3.5–5.1)
Sodium: 128 mEq/L — ABNORMAL LOW (ref 135–145)
Total Bilirubin: 0.3 mg/dL (ref 0.2–1.2)
Total Protein: 6.9 g/dL (ref 6.0–8.3)

## 2021-03-10 LAB — HEMOGLOBIN A1C: Hgb A1c MFr Bld: 5.4 % (ref 4.6–6.5)

## 2021-03-10 NOTE — Addendum Note (Signed)
Addended by: Boris Lown B on: 03/10/2021 04:02 PM   Modules accepted: Orders

## 2021-03-10 NOTE — Progress Notes (Signed)
Subjective:  Patient ID: Paula Griffith, female    DOB: 1946/01/20  Age: 75 y.o. MRN: [id number]  CC: Follow-up (4 month f/u- Flu shot)   HPI Paula Griffith presents for HTN, anxiety, stress  Outpatient Medications Prior to Visit  Medication Sig Dispense Refill   albuterol (PROAIR HFA) 108 (90 Base) MCG/ACT inhaler Inhale 2 puffs into the lungs every 4 (four) hours as needed. For shortness of breath 18 g 5   amLODipine (NORVASC) 5 MG tablet TAKE 1 TABLET BY MOUTH EVERY DAY 90 tablet 3   anagrelide (AGRYLIN) 1 MG capsule TAKE 1 CAPSULE (1 MG TOTAL) BY MOUTH DAILY EXCEPT 2 DAYS A WEEK (TUESDAYS AND THURSDAYS) DO NOT TAKE. 66 capsule 1   Ascorbic Acid (VITAMIN C) 1000 MG tablet Take 1,000 mg by mouth daily.     aspirin 325 MG EC tablet Take 325 mg by mouth daily after breakfast.     carvedilol (COREG) 25 MG tablet Take 1 tablet (25 mg total) by mouth 2 (two) times daily. 60 tablet 11   Cholecalciferol (VITAMIN D3) 2000 units TABS Take 2,000 Units by mouth daily.     cloNIDine (CATAPRES) 0.1 MG tablet TAKE 1 TABLET BY MOUTH 2 TIMES DAILY. 180 tablet 1   escitalopram (LEXAPRO) 5 MG tablet TAKE 1 TABLET (5 MG TOTAL) BY MOUTH DAILY. 90 tablet 1   Fluticasone-Salmeterol (ADVAIR) 250-50 MCG/DOSE AEPB Please specify directions, refills and quantity 1 each 11   furosemide (LASIX) 20 MG tablet TAKE 1-2 TABLETS (20-40 MG TOTAL) BY MOUTH DAILY AS NEEDED. 180 tablet 1   gabapentin (NEURONTIN) 100 MG capsule TAKE 1 CAPSULE BY MOUTH THREE TIMES A DAY 270 capsule 1   Multiple Vitamins-Minerals (ADULT GUMMY PO) Take 2 tablets by mouth daily.     potassium chloride (KLOR-CON) 10 MEQ tablet TAKE 1 TABLET BY MOUTH EVERY DAY 90 tablet 3   trolamine salicylate (ASPERCREME) 10 % cream Apply 1 application topically 2 (two) times daily as needed for muscle pain.     valsartan-hydrochlorothiazide (DIOVAN HCT) 320-25 MG tablet Take 1 tablet by mouth daily. 90 tablet 1   No facility-administered medications prior to  visit.    ROS: Review of Systems  Constitutional:  Negative for activity change, appetite change, chills, fatigue and unexpected weight change.  HENT:  Negative for congestion, mouth sores and sinus pressure.   Eyes:  Negative for visual disturbance.  Respiratory:  Negative for cough and chest tightness.   Gastrointestinal:  Negative for abdominal pain and nausea.  Genitourinary:  Negative for difficulty urinating, frequency and vaginal pain.  Musculoskeletal:  Positive for arthralgias, back pain and gait problem.  Skin:  Negative for pallor and rash.  Neurological:  Negative for dizziness, tremors, weakness, numbness and headaches.  Psychiatric/Behavioral:  Negative for confusion, sleep disturbance and suicidal ideas. The patient is nervous/anxious.    Objective:  BP (!) 152/62 (BP Location: Left Arm)   Pulse 69   Temp 98.1 F (36.7 C) (Oral)   Ht '5\' 2"'$  (1.575 m)   Wt 135 lb (61.2 kg)   SpO2 98%   BMI 24.69 kg/m   BP Readings from Last 3 Encounters:  03/10/21 (!) 152/62  01/09/21 (!) 158/66  10/31/20 (!) 168/68    Wt Readings from Last 3 Encounters:  03/10/21 135 lb (61.2 kg)  01/09/21 132 lb 12.8 oz (60.2 kg)  10/31/20 134 lb 1.9 oz (60.8 kg)    Physical Exam Constitutional:      General: She  is not in acute distress.    Appearance: She is well-developed. She is obese.  HENT:     Head: Normocephalic.     Right Ear: External ear normal.     Left Ear: External ear normal.     Nose: Nose normal.  Eyes:     General:        Right eye: No discharge.        Left eye: No discharge.     Conjunctiva/sclera: Conjunctivae normal.     Pupils: Pupils are equal, round, and reactive to light.  Neck:     Thyroid: No thyromegaly.     Vascular: No JVD.     Trachea: No tracheal deviation.  Cardiovascular:     Rate and Rhythm: Normal rate and regular rhythm.     Heart sounds: Normal heart sounds.  Pulmonary:     Effort: No respiratory distress.     Breath sounds: No  stridor. No wheezing.  Abdominal:     General: Bowel sounds are normal. There is no distension.     Palpations: Abdomen is soft. There is no mass.     Tenderness: There is no abdominal tenderness. There is no guarding or rebound.  Musculoskeletal:        General: Tenderness present.     Cervical back: Normal range of motion and neck supple. No rigidity.  Lymphadenopathy:     Cervical: No cervical adenopathy.  Skin:    Findings: No erythema or rash.  Neurological:     Mental Status: She is oriented to person, place, and time.     Cranial Nerves: No cranial nerve deficit.     Motor: No abnormal muscle tone.     Coordination: Coordination normal.     Gait: Gait abnormal.     Deep Tendon Reflexes: Reflexes normal.  Psychiatric:        Behavior: Behavior normal.        Thought Content: Thought content normal.        Judgment: Judgment normal.  Using a cane  Lab Results  Component Value Date   WBC 5.2 01/09/2021   HGB 10.1 (L) 01/09/2021   HCT 28.6 (L) 01/09/2021   PLT 310 01/09/2021   GLUCOSE 99 01/09/2021   CHOL 158 03/01/2018   TRIG 84.0 03/01/2018   HDL 47.10 03/01/2018   LDLCALC 94 03/01/2018   ALT <6 01/09/2021   AST 18 01/09/2021   NA 134 (L) 01/09/2021   K 4.0 01/09/2021   CL 98 01/09/2021   CREATININE 0.92 01/09/2021   BUN 17 01/09/2021   CO2 25 01/09/2021   TSH 1.29 03/01/2018   INR 0.97 12/29/2017   HGBA1C 6.8 (H) 08/26/2017    US BREAST LTD UNI RIGHT INC AXILLA  Result Date: 07/11/2020 CLINICAL DATA:  The patient was called back for a possible mass in the right periareolar region. EXAM: ULTRASOUND OF THE RIGHT BREAST COMPARISON:  Previous exam(s). FINDINGS: On physical exam, no suspicious lumps are identified. Targeted ultrasound is performed, showing ductal ectasia in the periareolar region inferiorly and medially accounting for the mammographic finding. IMPRESSION: Ductal ectasia.  No evidence of malignancy. RECOMMENDATION: Annual screening mammography. I  have discussed the findings and recommendations with the patient. If applicable, a reminder letter will be sent to the patient regarding the next appointment. BI-RADS CATEGORY  2: Benign. Electronically Signed   By: Dorise Bullion III M.D   On: 07/11/2020 13:25    Assessment & Plan:    [name],  MD

## 2021-03-10 NOTE — Assessment & Plan Note (Signed)
On Symbicort

## 2021-03-10 NOTE — Assessment & Plan Note (Signed)
Using a cane 

## 2021-03-10 NOTE — Assessment & Plan Note (Signed)
Continue w/diet

## 2021-03-10 NOTE — Assessment & Plan Note (Addendum)
Cont on Lexapro 

## 2021-03-18 ENCOUNTER — Other Ambulatory Visit: Payer: Self-pay | Admitting: Internal Medicine

## 2021-03-31 ENCOUNTER — Ambulatory Visit (INDEPENDENT_AMBULATORY_CARE_PROVIDER_SITE_OTHER): Payer: Medicare Other | Admitting: Internal Medicine

## 2021-03-31 ENCOUNTER — Other Ambulatory Visit: Payer: Self-pay

## 2021-03-31 ENCOUNTER — Encounter: Payer: Self-pay | Admitting: Internal Medicine

## 2021-03-31 VITALS — BP 126/70 | HR 60 | Temp 98.5°F | Resp 18 | Ht 62.0 in | Wt 134.0 lb

## 2021-03-31 DIAGNOSIS — R5383 Other fatigue: Secondary | ICD-10-CM | POA: Diagnosis not present

## 2021-03-31 DIAGNOSIS — J4531 Mild persistent asthma with (acute) exacerbation: Secondary | ICD-10-CM | POA: Diagnosis not present

## 2021-03-31 DIAGNOSIS — T50B95A Adverse effect of other viral vaccines, initial encounter: Secondary | ICD-10-CM | POA: Insufficient documentation

## 2021-03-31 DIAGNOSIS — N390 Urinary tract infection, site not specified: Secondary | ICD-10-CM | POA: Insufficient documentation

## 2021-03-31 DIAGNOSIS — N10 Acute tubulo-interstitial nephritis: Secondary | ICD-10-CM | POA: Diagnosis not present

## 2021-03-31 DIAGNOSIS — I10 Essential (primary) hypertension: Secondary | ICD-10-CM | POA: Diagnosis not present

## 2021-03-31 LAB — URINALYSIS, ROUTINE W REFLEX MICROSCOPIC
Bilirubin Urine: NEGATIVE
Ketones, ur: NEGATIVE
Nitrite: POSITIVE — AB
Specific Gravity, Urine: 1.015 (ref 1.000–1.030)
Total Protein, Urine: NEGATIVE
Urine Glucose: NEGATIVE
Urobilinogen, UA: 0.2 (ref 0.0–1.0)
pH: 6 (ref 5.0–8.0)

## 2021-03-31 LAB — COMPREHENSIVE METABOLIC PANEL
ALT: 5 U/L (ref 0–35)
AST: 12 U/L (ref 0–37)
Albumin: 4.1 g/dL (ref 3.5–5.2)
Alkaline Phosphatase: 56 U/L (ref 39–117)
BUN: 19 mg/dL (ref 6–23)
CO2: 28 mEq/L (ref 19–32)
Calcium: 10 mg/dL (ref 8.4–10.5)
Chloride: 92 mEq/L — ABNORMAL LOW (ref 96–112)
Creatinine, Ser: 0.89 mg/dL (ref 0.40–1.20)
GFR: 63.4 mL/min (ref >60.00)
Glucose, Bld: 96 mg/dL (ref 70–99)
Potassium: 4.5 mEq/L (ref 3.5–5.1)
Sodium: 128 mEq/L — ABNORMAL LOW (ref 135–145)
Total Bilirubin: 0.4 mg/dL (ref 0.2–1.2)
Total Protein: 7.1 g/dL (ref 6.0–8.3)

## 2021-03-31 LAB — CBC WITH DIFFERENTIAL/PLATELET
Basophils Absolute: 0.1 10*3/uL (ref 0.0–0.1)
Basophils Relative: 1.4 % (ref 0.0–3.0)
Eosinophils Absolute: 0 10*3/uL (ref 0.0–0.7)
Eosinophils Relative: 0.6 % (ref 0.0–5.0)
HCT: 28 % — ABNORMAL LOW (ref 36.0–46.0)
Hemoglobin: 9.5 g/dL — ABNORMAL LOW (ref 12.0–15.0)
Lymphocytes Relative: 27.3 % (ref 12.0–46.0)
Lymphs Abs: 1.6 10*3/uL (ref 0.7–4.0)
MCHC: 33.8 g/dL (ref 30.0–36.0)
MCV: 94.9 fl (ref 78.0–100.0)
Monocytes Absolute: 0.7 10*3/uL (ref 0.1–1.0)
Monocytes Relative: 11.8 % (ref 3.0–12.0)
Neutro Abs: 3.4 10*3/uL (ref 1.4–7.7)
Neutrophils Relative %: 58.9 % (ref 43.0–77.0)
Platelets: 327 10*3/uL (ref 150.0–400.0)
RBC: 2.95 Mil/uL — ABNORMAL LOW (ref 3.87–5.11)
RDW: 12 % (ref 11.5–15.5)
WBC: 5.8 10*3/uL (ref 4.0–10.5)

## 2021-03-31 MED ORDER — CEFUROXIME AXETIL 250 MG PO TABS: 250.0000 mg | ORAL_TABLET | Freq: Two times a day (BID) | ORAL | 0 refills | Status: AC

## 2021-03-31 NOTE — Assessment & Plan Note (Signed)
Continue on Symbicort.  Use a rescue inhaler as needed-Ventolin

## 2021-03-31 NOTE — Patient Instructions (Signed)
Reduce coreg to 1/2 tab twice a day. Hold clonidine for now

## 2021-03-31 NOTE — Assessment & Plan Note (Addendum)
Nl BP now w/fatigue. Reduce coreg to 1/2 tab twice a day. Hold clonidine for now Check CMET, CBC

## 2021-03-31 NOTE — Assessment & Plan Note (Signed)
Side effects from flu shot (Pt stated that she received her flu shot 2 weeks ago and was really sick. This past weekend was the first time she was able to get out of bed. ) Reduce coreg. Hold clonidine for now

## 2021-03-31 NOTE — Assessment & Plan Note (Signed)
New.  Likely a pyelonephritis.  Will prescribe Ceftin p.o.

## 2021-03-31 NOTE — Progress Notes (Signed)
Subjective:  Patient ID: Paula Griffith, female    DOB: 12-23-1945  Age: 75 y.o. MRN: [id number]  CC: Side effects from flu shot (Pt stated that she received her flu shot 2 weeks ago and was really sick. This past weekend was the first time she was able to get out of bed. )   HPI Paula Griffith presents for side effects from flu shot (Pt stated that she received her flu shot 2 weeks ago and was really sick. This past weekend was the first time she was able to get out of bed. ) C/o weakness, fatigue.  Complains of urinary symptoms. F/u HTN  Outpatient Medications Prior to Visit  Medication Sig Dispense Refill   albuterol (PROAIR HFA) 108 (90 Base) MCG/ACT inhaler Inhale 2 puffs into the lungs every 4 (four) hours as needed. For shortness of breath 18 g 5   amLODipine (NORVASC) 5 MG tablet TAKE 1 TABLET BY MOUTH EVERY DAY 90 tablet 3   anagrelide (AGRYLIN) 1 MG capsule TAKE 1 CAPSULE (1 MG TOTAL) BY MOUTH DAILY EXCEPT 2 DAYS A WEEK (TUESDAYS AND THURSDAYS) DO NOT TAKE. 66 capsule 1   Ascorbic Acid (VITAMIN C) 1000 MG tablet Take 1,000 mg by mouth daily.     aspirin 325 MG EC tablet Take 325 mg by mouth daily after breakfast.     carvedilol (COREG) 25 MG tablet Take 1 tablet (25 mg total) by mouth 2 (two) times daily. 60 tablet 11   Cholecalciferol (VITAMIN D3) 2000 units TABS Take 2,000 Units by mouth daily.     cloNIDine (CATAPRES) 0.1 MG tablet TAKE 1 TABLET BY MOUTH 2 TIMES DAILY. 180 tablet 1   escitalopram (LEXAPRO) 5 MG tablet TAKE 1 TABLET (5 MG TOTAL) BY MOUTH DAILY. 90 tablet 1   Fluticasone-Salmeterol (ADVAIR) 250-50 MCG/DOSE AEPB Please specify directions, refills and quantity 1 each 11   furosemide (LASIX) 20 MG tablet TAKE 1-2 TABLETS (20-40 MG TOTAL) BY MOUTH DAILY AS NEEDED. 180 tablet 1   gabapentin (NEURONTIN) 100 MG capsule TAKE 1 CAPSULE BY MOUTH THREE TIMES A DAY 270 capsule 1   Multiple Vitamins-Minerals (ADULT GUMMY PO) Take 2 tablets by mouth daily.     potassium  chloride (KLOR-CON) 10 MEQ tablet TAKE 1 TABLET BY MOUTH EVERY DAY 90 tablet 3   trolamine salicylate (ASPERCREME) 10 % cream Apply 1 application topically 2 (two) times daily as needed for muscle pain.     valsartan-hydrochlorothiazide (DIOVAN HCT) 320-25 MG tablet Take 1 tablet by mouth daily. 90 tablet 1   No facility-administered medications prior to visit.    ROS: Review of Systems  Constitutional:  Positive for fatigue. Negative for activity change, appetite change, chills and unexpected weight change.  HENT:  Negative for congestion, mouth sores and sinus pressure.   Eyes:  Negative for visual disturbance.  Respiratory:  Negative for cough and chest tightness.   Gastrointestinal:  Negative for abdominal pain and nausea.  Genitourinary:  Positive for dysuria and urgency. Negative for difficulty urinating, frequency and vaginal pain.  Musculoskeletal:  Positive for gait problem. Negative for back pain.  Skin:  Negative for pallor and rash.  Neurological:  Positive for weakness. Negative for dizziness, tremors, numbness and headaches.  Psychiatric/Behavioral:  Negative for confusion, sleep disturbance and suicidal ideas.    Objective:  BP 126/70   Pulse 60   Temp 98.5 F (36.9 C) (Oral)   Resp 18   Ht 5\' 2"  (1.575 m)   Wt  134 lb (60.8 kg)   SpO2 96%   BMI 24.51 kg/m   BP Readings from Last 3 Encounters:  03/31/21 126/70  03/10/21 (!) 152/62  01/09/21 (!) 158/66    Wt Readings from Last 3 Encounters:  03/31/21 134 lb (60.8 kg)  03/10/21 135 lb (61.2 kg)  01/09/21 132 lb 12.8 oz (60.2 kg)    Physical Exam Constitutional:      General: She is not in acute distress.    Appearance: She is well-developed. She is not toxic-appearing.  HENT:     Head: Normocephalic.     Right Ear: External ear normal.     Left Ear: External ear normal.     Nose: Nose normal.  Eyes:     General:        Right eye: No discharge.        Left eye: No discharge.     Conjunctiva/sclera:  Conjunctivae normal.     Pupils: Pupils are equal, round, and reactive to light.  Neck:     Thyroid: No thyromegaly.     Vascular: No JVD.     Trachea: No tracheal deviation.  Cardiovascular:     Rate and Rhythm: Normal rate and regular rhythm.     Heart sounds: Normal heart sounds.  Pulmonary:     Effort: No respiratory distress.     Breath sounds: No stridor. No wheezing.  Abdominal:     General: Bowel sounds are normal. There is no distension.     Palpations: Abdomen is soft. There is no mass.     Tenderness: There is no abdominal tenderness. There is no guarding or rebound.  Musculoskeletal:        General: No tenderness.     Cervical back: Normal range of motion and neck supple. No rigidity.  Lymphadenopathy:     Cervical: No cervical adenopathy.  Skin:    Findings: No erythema or rash.  Neurological:     Cranial Nerves: No cranial nerve deficit.     Motor: No abnormal muscle tone.     Coordination: Coordination abnormal.     Deep Tendon Reflexes: Reflexes normal.  Psychiatric:        Behavior: Behavior normal.        Thought Content: Thought content normal.        Judgment: Judgment normal.   Looks tired Using a cane  Lab Results  Component Value Date   WBC 5.2 01/09/2021   HGB 10.1 (L) 01/09/2021   HCT 28.6 (L) 01/09/2021   PLT 310 01/09/2021   GLUCOSE 98 03/10/2021   CHOL 158 03/01/2018   TRIG 84.0 03/01/2018   HDL 47.10 03/01/2018   LDLCALC 94 03/01/2018   ALT 4 03/10/2021   AST 11 03/10/2021   NA 128 (L) 03/10/2021   K 3.8 03/10/2021   CL 94 (L) 03/10/2021   CREATININE 0.76 03/10/2021   BUN 12 03/10/2021   CO2 26 03/10/2021   TSH 1.29 03/01/2018   INR 0.97 12/29/2017   HGBA1C 5.4 03/10/2021    US BREAST LTD UNI RIGHT INC AXILLA  Result Date: 07/11/2020 CLINICAL DATA:  The patient was called back for a possible mass in the right periareolar region. EXAM: ULTRASOUND OF THE RIGHT BREAST COMPARISON:  Previous exam(s). FINDINGS: On physical exam, no  suspicious lumps are identified. Targeted ultrasound is performed, showing ductal ectasia in the periareolar region inferiorly and medially accounting for the mammographic finding. IMPRESSION: Ductal ectasia.  No evidence of malignancy. RECOMMENDATION: Annual screening  mammography. I have discussed the findings and recommendations with the patient. If applicable, a reminder letter will be sent to the patient regarding the next appointment. BI-RADS CATEGORY  2: Benign. Electronically Signed   By: Dorise Bullion III M.D   On: 07/11/2020 13:25    Assessment & Plan:   Problem List Items Addressed This Visit   None     Follow-up: No follow-ups on file.  [name], MD

## 2021-04-10 ENCOUNTER — Other Ambulatory Visit: Payer: Self-pay

## 2021-04-10 ENCOUNTER — Inpatient Hospital Stay: Payer: Medicare Other | Attending: Hematology

## 2021-04-10 DIAGNOSIS — D471 Chronic myeloproliferative disease: Secondary | ICD-10-CM | POA: Diagnosis not present

## 2021-04-10 LAB — CMP (CANCER CENTER ONLY)
ALT: 30 U/L (ref 0–44)
AST: 62 U/L — ABNORMAL HIGH (ref 15–41)
Albumin: 4.1 g/dL (ref 3.5–5.0)
Alkaline Phosphatase: 73 U/L (ref 38–126)
Anion gap: 8 (ref 5–15)
BUN: 23 mg/dL (ref 8–23)
CO2: 24 mmol/L (ref 22–32)
Calcium: 9.3 mg/dL (ref 8.9–10.3)
Chloride: 96 mmol/L — ABNORMAL LOW (ref 98–111)
Creatinine: 0.97 mg/dL (ref 0.44–1.00)
GFR, Estimated: >60 mL/min (ref >60)
Glucose, Bld: 156 mg/dL — ABNORMAL HIGH (ref 70–99)
Potassium: 4 mmol/L (ref 3.5–5.1)
Sodium: 128 mmol/L — ABNORMAL LOW (ref 135–145)
Total Bilirubin: 0.8 mg/dL (ref 0.3–1.2)
Total Protein: 7.3 g/dL (ref 6.5–8.1)

## 2021-04-10 LAB — CBC WITH DIFFERENTIAL/PLATELET
Abs Immature Granulocytes: 0.02 10*3/uL (ref 0.00–0.07)
Basophils Absolute: 0 10*3/uL (ref 0.0–0.1)
Basophils Relative: 1 %
Eosinophils Absolute: 0 10*3/uL (ref 0.0–0.5)
Eosinophils Relative: 0 %
HCT: 25.8 % — ABNORMAL LOW (ref 36.0–46.0)
Hemoglobin: 8.8 g/dL — ABNORMAL LOW (ref 12.0–15.0)
Immature Granulocytes: 0 %
Lymphocytes Relative: 15 %
Lymphs Abs: 1.2 10*3/uL (ref 0.7–4.0)
MCH: 31.7 pg (ref 26.0–34.0)
MCHC: 34.1 g/dL (ref 30.0–36.0)
MCV: 92.8 fL (ref 80.0–100.0)
Monocytes Absolute: 0.6 10*3/uL (ref 0.1–1.0)
Monocytes Relative: 8 %
Neutro Abs: 5.7 10*3/uL (ref 1.7–7.7)
Neutrophils Relative %: 76 %
Platelets: 244 10*3/uL (ref 150–400)
RBC: 2.78 MIL/uL — ABNORMAL LOW (ref 3.87–5.11)
RDW: 11.4 % — ABNORMAL LOW (ref 11.5–15.5)
WBC: 7.5 10*3/uL (ref 4.0–10.5)
nRBC: 0 % (ref 0.0–0.2)

## 2021-04-28 ENCOUNTER — Ambulatory Visit (INDEPENDENT_AMBULATORY_CARE_PROVIDER_SITE_OTHER): Payer: Medicare Other | Admitting: Internal Medicine

## 2021-04-28 ENCOUNTER — Encounter: Payer: Self-pay | Admitting: Internal Medicine

## 2021-04-28 ENCOUNTER — Other Ambulatory Visit: Payer: Self-pay

## 2021-04-28 DIAGNOSIS — J4531 Mild persistent asthma with (acute) exacerbation: Secondary | ICD-10-CM | POA: Diagnosis not present

## 2021-04-28 DIAGNOSIS — D471 Chronic myeloproliferative disease: Secondary | ICD-10-CM

## 2021-04-28 DIAGNOSIS — I10 Essential (primary) hypertension: Secondary | ICD-10-CM | POA: Diagnosis not present

## 2021-04-28 DIAGNOSIS — R739 Hyperglycemia, unspecified: Secondary | ICD-10-CM | POA: Diagnosis not present

## 2021-04-28 LAB — TSH: TSH: 0.66 u[IU]/mL (ref 0.35–5.50)

## 2021-04-28 LAB — CBC WITH DIFFERENTIAL/PLATELET
Basophils Absolute: 0 10*3/uL (ref 0.0–0.1)
Basophils Relative: 0.9 % (ref 0.0–3.0)
Eosinophils Absolute: 0.2 10*3/uL (ref 0.0–0.7)
Eosinophils Relative: 4.4 % (ref 0.0–5.0)
HCT: 27.4 % — ABNORMAL LOW (ref 36.0–46.0)
Hemoglobin: 9.1 g/dL — ABNORMAL LOW (ref 12.0–15.0)
Lymphocytes Relative: 23.2 % (ref 12.0–46.0)
Lymphs Abs: 1.3 10*3/uL (ref 0.7–4.0)
MCHC: 33.4 g/dL (ref 30.0–36.0)
MCV: 97 fl (ref 78.0–100.0)
Monocytes Absolute: 0.6 10*3/uL (ref 0.1–1.0)
Monocytes Relative: 10.9 % (ref 3.0–12.0)
Neutro Abs: 3.3 10*3/uL (ref 1.4–7.7)
Neutrophils Relative %: 60.6 % (ref 43.0–77.0)
Platelets: 358 10*3/uL (ref 150.0–400.0)
RBC: 2.82 Mil/uL — ABNORMAL LOW (ref 3.87–5.11)
RDW: 12.4 % (ref 11.5–15.5)
WBC: 5.4 10*3/uL (ref 4.0–10.5)

## 2021-04-28 LAB — COMPREHENSIVE METABOLIC PANEL
ALT: 6 U/L (ref 0–35)
AST: 18 U/L (ref 0–37)
Albumin: 3.9 g/dL (ref 3.5–5.2)
Alkaline Phosphatase: 47 U/L (ref 39–117)
BUN: 15 mg/dL (ref 6–23)
CO2: 26 mEq/L (ref 19–32)
Calcium: 9.4 mg/dL (ref 8.4–10.5)
Chloride: 101 mEq/L (ref 96–112)
Creatinine, Ser: 0.85 mg/dL (ref 0.40–1.20)
GFR: 66.96 mL/min (ref >60.00)
Glucose, Bld: 143 mg/dL — ABNORMAL HIGH (ref 70–99)
Potassium: 3.6 mEq/L (ref 3.5–5.1)
Sodium: 136 mEq/L (ref 135–145)
Total Bilirubin: 0.4 mg/dL (ref 0.2–1.2)
Total Protein: 6.9 g/dL (ref 6.0–8.3)

## 2021-04-28 LAB — URINALYSIS, ROUTINE W REFLEX MICROSCOPIC
Bilirubin Urine: NEGATIVE
Hgb urine dipstick: NEGATIVE
Ketones, ur: NEGATIVE
Nitrite: POSITIVE — AB
RBC / HPF: NONE SEEN (ref 0–?)
Specific Gravity, Urine: 1.02 (ref 1.000–1.030)
Total Protein, Urine: NEGATIVE
Urine Glucose: NEGATIVE
Urobilinogen, UA: 0.2 (ref 0.0–1.0)
pH: 6 (ref 5.0–8.0)

## 2021-04-28 LAB — HEMOGLOBIN A1C: Hgb A1c MFr Bld: 5.4 % (ref 4.6–6.5)

## 2021-04-28 NOTE — Assessment & Plan Note (Signed)
Continue on Symbicort.  Use a rescue inhaler as needed-Ventolin

## 2021-04-28 NOTE — Assessment & Plan Note (Signed)
Anemic due to MPN. F/u w/Dr Feng 

## 2021-04-28 NOTE — Assessment & Plan Note (Signed)
Monitor A1c 

## 2021-04-28 NOTE — Assessment & Plan Note (Signed)
Anemic. F/u w/Dr Burr Medico

## 2021-04-28 NOTE — Assessment & Plan Note (Signed)
On Clonidine and Coreg

## 2021-04-28 NOTE — Progress Notes (Signed)
Subjective:  Patient ID: Paula Griffith, female    DOB: [birth year]  Age: 75 y.o. MRN: [id number]  CC: Follow-up (4 week f/u)   HPI Paula Griffith presents for UTI, HTN, anemia f/u. Feeling better overall.  Outpatient Medications Prior to Visit  Medication Sig Dispense Refill   albuterol (PROAIR HFA) 108 (90 Base) MCG/ACT inhaler Inhale 2 puffs into the lungs every 4 (four) hours as needed. For shortness of breath 18 g 5   amLODipine (NORVASC) 5 MG tablet TAKE 1 TABLET BY MOUTH EVERY DAY 90 tablet 3   anagrelide (AGRYLIN) 1 MG capsule TAKE 1 CAPSULE (1 MG TOTAL) BY MOUTH DAILY EXCEPT 2 DAYS A WEEK (TUESDAYS AND THURSDAYS) DO NOT TAKE. 66 capsule 1   Ascorbic Acid (VITAMIN C) 1000 MG tablet Take 1,000 mg by mouth daily.     aspirin 325 MG EC tablet Take 325 mg by mouth daily after breakfast.     carvedilol (COREG) 25 MG tablet Take 1 tablet (25 mg total) by mouth 2 (two) times daily. 60 tablet 11   Cholecalciferol (VITAMIN D3) 2000 units TABS Take 2,000 Units by mouth daily.     cloNIDine (CATAPRES) 0.1 MG tablet TAKE 1 TABLET BY MOUTH 2 TIMES DAILY. 180 tablet 1   escitalopram (LEXAPRO) 5 MG tablet TAKE 1 TABLET (5 MG TOTAL) BY MOUTH DAILY. 90 tablet 1   Fluticasone-Salmeterol (ADVAIR) 250-50 MCG/DOSE AEPB Please specify directions, refills and quantity 1 each 11   furosemide (LASIX) 20 MG tablet TAKE 1-2 TABLETS (20-40 MG TOTAL) BY MOUTH DAILY AS NEEDED. 180 tablet 1   gabapentin (NEURONTIN) 100 MG capsule TAKE 1 CAPSULE BY MOUTH THREE TIMES A DAY 270 capsule 1   Multiple Vitamins-Minerals (ADULT GUMMY PO) Take 2 tablets by mouth daily.     potassium chloride (KLOR-CON) 10 MEQ tablet TAKE 1 TABLET BY MOUTH EVERY DAY 90 tablet 3   trolamine salicylate (ASPERCREME) 10 % cream Apply 1 application topically 2 (two) times daily as needed for muscle pain.     valsartan-hydrochlorothiazide (DIOVAN HCT) 320-25 MG tablet Take 1 tablet by mouth daily. 90 tablet 1   No facility-administered  medications prior to visit.    ROS: Review of Systems  Constitutional:  Positive for fatigue. Negative for activity change, appetite change, chills and unexpected weight change.  HENT:  Negative for congestion, mouth sores and sinus pressure.   Eyes:  Negative for visual disturbance.  Respiratory:  Negative for cough and chest tightness.   Gastrointestinal:  Negative for abdominal pain and nausea.  Genitourinary:  Negative for difficulty urinating, frequency and vaginal pain.  Musculoskeletal:  Positive for back pain and gait problem.  Skin:  Negative for pallor and rash.  Neurological:  Negative for dizziness, tremors, weakness, numbness and headaches.  Psychiatric/Behavioral:  Negative for confusion, sleep disturbance and suicidal ideas.    Objective:  BP (!) 168/62 (BP Location: Left Arm)   Pulse 75   Temp 98.9 F (37.2 C) (Oral)   Ht 5\' 2"  (1.575 m)   Wt 131 lb 6.4 oz (59.6 kg)   SpO2 99%   BMI 24.03 kg/m   BP Readings from Last 3 Encounters:  04/28/21 (!) 168/62  03/31/21 126/70  03/10/21 (!) 152/62    Wt Readings from Last 3 Encounters:  04/28/21 131 lb 6.4 oz (59.6 kg)  03/31/21 134 lb (60.8 kg)  03/10/21 135 lb (61.2 kg)    Physical Exam Constitutional:      General: She is  not in acute distress.    Appearance: She is well-developed.  HENT:     Head: Normocephalic.     Right Ear: External ear normal.     Left Ear: External ear normal.     Nose: Nose normal.  Eyes:     General:        Right eye: No discharge.        Left eye: No discharge.     Conjunctiva/sclera: Conjunctivae normal.     Pupils: Pupils are equal, round, and reactive to light.  Neck:     Thyroid: No thyromegaly.     Vascular: No JVD.     Trachea: No tracheal deviation.  Cardiovascular:     Rate and Rhythm: Normal rate and regular rhythm.     Heart sounds: Normal heart sounds.  Pulmonary:     Effort: No respiratory distress.     Breath sounds: No stridor. No wheezing.  Abdominal:      General: Bowel sounds are normal. There is no distension.     Palpations: Abdomen is soft. There is no mass.     Tenderness: There is no abdominal tenderness. There is no guarding or rebound.  Musculoskeletal:        General: Tenderness present.     Cervical back: Normal range of motion and neck supple. No rigidity.  Lymphadenopathy:     Cervical: No cervical adenopathy.  Skin:    Findings: No erythema or rash.  Neurological:     Mental Status: She is oriented to person, place, and time.     Cranial Nerves: No cranial nerve deficit.     Motor: No abnormal muscle tone.     Coordination: Coordination normal.     Gait: Gait abnormal.     Deep Tendon Reflexes: Reflexes normal.  Psychiatric:        Behavior: Behavior normal.        Thought Content: Thought content normal.        Judgment: Judgment normal.  LS w/pain Antalgic gait, using a cane  Lab Results  Component Value Date   WBC 5.4 04/28/2021   HGB 9.1 (L) 04/28/2021   HCT 27.4 (L) 04/28/2021   PLT 358.0 04/28/2021   GLUCOSE 143 (H) 04/28/2021   CHOL 158 03/01/2018   TRIG 84.0 03/01/2018   HDL 47.10 03/01/2018   LDLCALC 94 03/01/2018   ALT 6 04/28/2021   AST 18 04/28/2021   NA 136 04/28/2021   K 3.6 04/28/2021   CL 101 04/28/2021   CREATININE 0.85 04/28/2021   BUN 15 04/28/2021   CO2 26 04/28/2021   TSH 0.66 04/28/2021   INR 0.97 12/29/2017   HGBA1C 5.4 04/28/2021    US BREAST LTD UNI RIGHT INC AXILLA  Result Date: 07/11/2020 CLINICAL DATA:  The patient was called back for a possible mass in the right periareolar region. EXAM: ULTRASOUND OF THE RIGHT BREAST COMPARISON:  Previous exam(s). FINDINGS: On physical exam, no suspicious lumps are identified. Targeted ultrasound is performed, showing ductal ectasia in the periareolar region inferiorly and medially accounting for the mammographic finding. IMPRESSION: Ductal ectasia.  No evidence of malignancy. RECOMMENDATION: Annual screening mammography. I have  discussed the findings and recommendations with the patient. If applicable, a reminder letter will be sent to the patient regarding the next appointment. BI-RADS CATEGORY  2: Benign. Electronically Signed   By: Dorise Bullion III M.D   On: 07/11/2020 13:25    Assessment & Plan:   Problem List Items Addressed  This Visit     Asthma    Continue on Symbicort.  Use a rescue inhaler as needed-Ventolin      Relevant Orders   TSH (Completed)   Essential hypertension    On Clonidine and Coreg      Relevant Orders   TSH (Completed)   Hyperglycemia    Monitor A1c      Relevant Orders   Hemoglobin A1c (Completed)   TSH (Completed)   MPN (myeloproliferative neoplasm) (HCC)    Anemic. F/u w/Dr Burr Medico      Relevant Orders   Hemoglobin A1c (Completed)   Comprehensive metabolic panel (Completed)   CBC with Differential/Platelet (Completed)   TSH (Completed)   Urinalysis      No orders of the defined types were placed in this encounter.     Follow-up: Return in about 3 months (around 07/29/2021) for a follow-up visit.  [name], MD

## 2021-04-30 ENCOUNTER — Other Ambulatory Visit: Payer: Self-pay | Admitting: Internal Medicine

## 2021-04-30 DIAGNOSIS — N10 Acute tubulo-interstitial nephritis: Secondary | ICD-10-CM

## 2021-05-19 ENCOUNTER — Other Ambulatory Visit: Payer: Self-pay | Admitting: Hematology

## 2021-05-19 DIAGNOSIS — D471 Chronic myeloproliferative disease: Secondary | ICD-10-CM

## 2021-05-19 MED ORDER — ANAGRELIDE HCL 1 MG PO CAPS: ORAL_CAPSULE | ORAL | 1 refills | Status: DC

## 2021-06-05 ENCOUNTER — Telehealth: Payer: Self-pay | Admitting: Internal Medicine

## 2021-06-05 ENCOUNTER — Other Ambulatory Visit (INDEPENDENT_AMBULATORY_CARE_PROVIDER_SITE_OTHER): Payer: Medicare Other

## 2021-06-05 ENCOUNTER — Other Ambulatory Visit: Payer: Self-pay

## 2021-06-05 DIAGNOSIS — N10 Acute tubulo-interstitial nephritis: Secondary | ICD-10-CM | POA: Diagnosis not present

## 2021-06-05 LAB — URINALYSIS, ROUTINE W REFLEX MICROSCOPIC
Bilirubin Urine: NEGATIVE
Ketones, ur: NEGATIVE
Nitrite: POSITIVE — AB
Specific Gravity, Urine: 1.02 (ref 1.000–1.030)
Total Protein, Urine: NEGATIVE
Urine Glucose: NEGATIVE
Urobilinogen, UA: 0.2 (ref 0.0–1.0)
pH: 5.5 (ref 5.0–8.0)

## 2021-06-05 NOTE — Telephone Encounter (Signed)
Rec'd " Ask your PCP" form.. (see form in purple folder) nurse came out to visit from insurance 06/04/21 it states she need Annual Wellness. Pt is schedule for 4 month f/u 07/14/21 w/[name]. Can annual wellness be done same day w/ Mignon Pine, LPN.Marland KitchenJohny Chess

## 2021-06-05 NOTE — Telephone Encounter (Signed)
Type of form received (Home Health, FMLA, disability, handicapped placard, Surgical clearance) Ask You PCP form  Form placed in (E-fax folder, Provider mailbox) Provider mailbox  Additional instructions from the patient (mail, fax, notify by phone when complete) Notify patient by phone when complete  Things to remember: [place] office: If form received in person, remind patient that forms take 7-10 business days CMA should attach charge sheet and put on Supervisor's desk

## 2021-06-07 LAB — CULTURE, URINE COMPREHENSIVE

## 2021-06-17 ENCOUNTER — Other Ambulatory Visit: Payer: Self-pay | Admitting: Internal Medicine

## 2021-07-08 ENCOUNTER — Telehealth: Payer: Self-pay | Admitting: Hematology

## 2021-07-08 NOTE — Telephone Encounter (Signed)
R/s per 1/10 in basket, pt has been called and confirmed appt

## 2021-07-10 ENCOUNTER — Other Ambulatory Visit: Payer: BC Managed Care – PPO

## 2021-07-10 ENCOUNTER — Ambulatory Visit: Payer: BC Managed Care – PPO | Admitting: Hematology

## 2021-07-12 ENCOUNTER — Other Ambulatory Visit: Payer: Self-pay | Admitting: Internal Medicine

## 2021-07-14 ENCOUNTER — Ambulatory Visit: Payer: Medicare PPO | Admitting: Internal Medicine

## 2021-07-14 ENCOUNTER — Encounter: Payer: Self-pay | Admitting: Internal Medicine

## 2021-07-14 ENCOUNTER — Other Ambulatory Visit: Payer: Self-pay

## 2021-07-14 DIAGNOSIS — F4321 Adjustment disorder with depressed mood: Secondary | ICD-10-CM

## 2021-07-14 DIAGNOSIS — I10 Essential (primary) hypertension: Secondary | ICD-10-CM | POA: Diagnosis not present

## 2021-07-14 DIAGNOSIS — L729 Follicular cyst of the skin and subcutaneous tissue, unspecified: Secondary | ICD-10-CM

## 2021-07-14 DIAGNOSIS — H539 Unspecified visual disturbance: Secondary | ICD-10-CM | POA: Diagnosis not present

## 2021-07-14 NOTE — Assessment & Plan Note (Addendum)
Cont on Diovan HCT  320/25, Clonidine twice daily. Continue Coreg 25 mg bid, Norvasc.  BP is ok at home

## 2021-07-14 NOTE — Progress Notes (Signed)
Subjective:  Patient ID: Paula Griffith, female    DOB: [birth year]  Age: 76 y.o. MRN: [id number]  CC: Follow-up (4 month f/u)   HPI Paula Griffith presents for a cyst on the forehead C/o vision changes C/o grief  - 26 yo sister died on 07/20/21  Outpatient Medications Prior to Visit  Medication Sig Dispense Refill   albuterol (PROAIR HFA) 108 (90 Base) MCG/ACT inhaler Inhale 2 puffs into the lungs every 4 (four) hours as needed. For shortness of breath 18 g 5   amLODipine (NORVASC) 5 MG tablet TAKE 1 TABLET BY MOUTH EVERY DAY 90 tablet 3   anagrelide (AGRYLIN) 1 MG capsule TAKE 1 CAPSULE BY MOUTH DAILY EXCEPT 2 DAYS A WEEK (TUESDAYS AND THURSDAYS) DO NOT TAKE. 66 capsule 1   Ascorbic Acid (VITAMIN C) 1000 MG tablet Take 1,000 mg by mouth daily.     aspirin 325 MG EC tablet Take 325 mg by mouth daily after breakfast.     carvedilol (COREG) 25 MG tablet Take 1 tablet (25 mg total) by mouth 2 (two) times daily. 60 tablet 11   Cholecalciferol (VITAMIN D3) 2000 units TABS Take 2,000 Units by mouth daily.     cloNIDine (CATAPRES) 0.1 MG tablet TAKE 1 TABLET BY MOUTH 2 TIMES DAILY. 180 tablet 1   escitalopram (LEXAPRO) 5 MG tablet TAKE 1 TABLET (5 MG TOTAL) BY MOUTH DAILY. 90 tablet 1   Fluticasone-Salmeterol (ADVAIR) 250-50 MCG/DOSE AEPB Please specify directions, refills and quantity 1 each 11   furosemide (LASIX) 20 MG tablet TAKE 1-2 TABLETS (20-40 MG TOTAL) BY MOUTH DAILY AS NEEDED. 180 tablet 1   gabapentin (NEURONTIN) 100 MG capsule TAKE 1 CAPSULE BY MOUTH THREE TIMES A DAY 270 capsule 1   Multiple Vitamins-Minerals (ADULT GUMMY PO) Take 2 tablets by mouth daily.     potassium chloride (KLOR-CON) 10 MEQ tablet TAKE 1 TABLET BY MOUTH EVERY DAY 90 tablet 3   trolamine salicylate (ASPERCREME) 10 % cream Apply 1 application topically 2 (two) times daily as needed for muscle pain.     valsartan-hydrochlorothiazide (DIOVAN-HCT) 320-25 MG tablet TAKE 1 TABLET BY MOUTH EVERY DAY 90 tablet 3    No facility-administered medications prior to visit.    ROS: Review of Systems  Constitutional:  Negative for activity change, appetite change, chills, fatigue and unexpected weight change.  HENT:  Negative for congestion, mouth sores and sinus pressure.   Eyes:  Negative for visual disturbance.  Respiratory:  Negative for cough and chest tightness.   Gastrointestinal:  Negative for abdominal pain and nausea.  Genitourinary:  Negative for difficulty urinating, frequency and vaginal pain.  Musculoskeletal:  Positive for arthralgias, back pain and gait problem.  Skin:  Negative for pallor and rash.  Neurological:  Negative for dizziness, tremors, weakness, numbness and headaches.  Psychiatric/Behavioral:  Positive for dysphoric mood. Negative for confusion, sleep disturbance and suicidal ideas. The patient is not nervous/anxious.    Objective:  BP (!) 168/62 (BP Location: Left Arm)    Pulse 73    Temp 98.9 F (37.2 C) (Oral)    Ht 5\' 2"  (1.575 m)    Wt 133 lb 12.8 oz (60.7 kg)    SpO2 97%    BMI 24.47 kg/m   BP Readings from Last 3 Encounters:  07/14/21 (!) 168/62  04/28/21 (!) 168/62  03/31/21 126/70    Wt Readings from Last 3 Encounters:  07/14/21 133 lb 12.8 oz (60.7 kg)  04/28/21 131 lb 6.4  oz (59.6 kg)  03/31/21 134 lb (60.8 kg)    Physical Exam Constitutional:      General: She is not in acute distress.    Appearance: She is well-developed.  HENT:     Head: Normocephalic.     Right Ear: External ear normal.     Left Ear: External ear normal.     Nose: Nose normal.  Eyes:     General:        Right eye: No discharge.        Left eye: No discharge.     Conjunctiva/sclera: Conjunctivae normal.     Pupils: Pupils are equal, round, and reactive to light.  Neck:     Thyroid: No thyromegaly.     Vascular: No JVD.     Trachea: No tracheal deviation.  Cardiovascular:     Rate and Rhythm: Normal rate and regular rhythm.     Heart sounds: Normal heart sounds.   Pulmonary:     Effort: No respiratory distress.     Breath sounds: No stridor. No wheezing.  Abdominal:     General: Bowel sounds are normal. There is no distension.     Palpations: Abdomen is soft. There is no mass.     Tenderness: There is no abdominal tenderness. There is no guarding or rebound.  Musculoskeletal:        General: No tenderness.     Cervical back: Normal range of motion and neck supple. No rigidity.  Lymphadenopathy:     Cervical: No cervical adenopathy.  Skin:    Findings: No erythema or rash.  Neurological:     Mental Status: She is oriented to person, place, and time.     Cranial Nerves: No cranial nerve deficit.     Motor: No abnormal muscle tone.     Coordination: Coordination abnormal.     Gait: Gait abnormal.     Deep Tendon Reflexes: Reflexes normal.  Psychiatric:        Behavior: Behavior normal.        Thought Content: Thought content normal.        Judgment: Judgment normal.  L forehead 11 mm cyst Using a cane, back brace   Lab Results  Component Value Date   WBC 5.4 04/28/2021   HGB 9.1 (L) 04/28/2021   HCT 27.4 (L) 04/28/2021   PLT 358.0 04/28/2021   GLUCOSE 143 (H) 04/28/2021   CHOL 158 03/01/2018   TRIG 84.0 03/01/2018   HDL 47.10 03/01/2018   LDLCALC 94 03/01/2018   ALT 6 04/28/2021   AST 18 04/28/2021   NA 136 04/28/2021   K 3.6 04/28/2021   CL 101 04/28/2021   CREATININE 0.85 04/28/2021   BUN 15 04/28/2021   CO2 26 04/28/2021   TSH 0.66 04/28/2021   INR 0.97 12/29/2017   HGBA1C 5.4 04/28/2021    US BREAST LTD UNI RIGHT INC AXILLA  Result Date: 07/11/2020 CLINICAL DATA:  The patient was called back for a possible mass in the right periareolar region. EXAM: ULTRASOUND OF THE RIGHT BREAST COMPARISON:  Previous exam(s). FINDINGS: On physical exam, no suspicious lumps are identified. Targeted ultrasound is performed, showing ductal ectasia in the periareolar region inferiorly and medially accounting for the mammographic finding.  IMPRESSION: Ductal ectasia.  No evidence of malignancy. RECOMMENDATION: Annual screening mammography. I have discussed the findings and recommendations with the patient. If applicable, a reminder letter will be sent to the patient regarding the next appointment. BI-RADS CATEGORY  2: Benign.  Electronically Signed   By: Dorise Bullion III M.D   On: 07/11/2020 13:25    Assessment & Plan:   Problem List Items Addressed This Visit     Essential hypertension     Cont on Diovan HCT  320/25, Clonidine twice daily. Continue Coreg 25 mg bid, Norvasc.  BP is ok at home      Grief    C/o grief  - 34 yo sister died on 07/17/2021 Discussed      Skin cyst    New L forehead. Derm ref      Relevant Orders   Ambulatory referral to Ophthalmology   Vision changes    New Ophth referral for  an eye exam      Relevant Orders   Ambulatory referral to Dermatology      No orders of the defined types were placed in this encounter.     Follow-up: Return in about 3 months (around 10/12/2021) for a follow-up visit.  [name], MD

## 2021-07-14 NOTE — Assessment & Plan Note (Signed)
New Ophth referral for  an eye exam

## 2021-07-14 NOTE — Assessment & Plan Note (Signed)
C/o grief  - 76 yo sister died on 2021/07/26 Discussed

## 2021-07-14 NOTE — Assessment & Plan Note (Signed)
New L forehead. Derm ref

## 2021-07-24 ENCOUNTER — Other Ambulatory Visit: Payer: Self-pay

## 2021-07-24 ENCOUNTER — Inpatient Hospital Stay: Payer: Medicare PPO | Admitting: Hematology

## 2021-07-24 ENCOUNTER — Inpatient Hospital Stay: Payer: Medicare PPO | Attending: Hematology

## 2021-07-24 VITALS — BP 155/65 | HR 56 | Temp 98.6°F | Resp 18 | Ht 62.0 in | Wt 133.5 lb

## 2021-07-24 DIAGNOSIS — D649 Anemia, unspecified: Secondary | ICD-10-CM | POA: Diagnosis not present

## 2021-07-24 DIAGNOSIS — D471 Chronic myeloproliferative disease: Secondary | ICD-10-CM

## 2021-07-24 DIAGNOSIS — Z79899 Other long term (current) drug therapy: Secondary | ICD-10-CM | POA: Diagnosis not present

## 2021-07-24 DIAGNOSIS — D473 Essential (hemorrhagic) thrombocythemia: Secondary | ICD-10-CM | POA: Diagnosis present

## 2021-07-24 DIAGNOSIS — J45909 Unspecified asthma, uncomplicated: Secondary | ICD-10-CM | POA: Insufficient documentation

## 2021-07-24 DIAGNOSIS — Z7951 Long term (current) use of inhaled steroids: Secondary | ICD-10-CM | POA: Insufficient documentation

## 2021-07-24 DIAGNOSIS — I10 Essential (primary) hypertension: Secondary | ICD-10-CM | POA: Diagnosis not present

## 2021-07-24 DIAGNOSIS — Z7982 Long term (current) use of aspirin: Secondary | ICD-10-CM | POA: Diagnosis not present

## 2021-07-24 LAB — CBC WITH DIFFERENTIAL/PLATELET
Abs Immature Granulocytes: 0.01 10*3/uL (ref 0.00–0.07)
Basophils Absolute: 0.1 10*3/uL (ref 0.0–0.1)
Basophils Relative: 1 %
Eosinophils Absolute: 0.1 10*3/uL (ref 0.0–0.5)
Eosinophils Relative: 1 %
HCT: 26.5 % — ABNORMAL LOW (ref 36.0–46.0)
Hemoglobin: 8.9 g/dL — ABNORMAL LOW (ref 12.0–15.0)
Immature Granulocytes: 0 %
Lymphocytes Relative: 35 %
Lymphs Abs: 1.5 10*3/uL (ref 0.7–4.0)
MCH: 31.4 pg (ref 26.0–34.0)
MCHC: 33.6 g/dL (ref 30.0–36.0)
MCV: 93.6 fL (ref 80.0–100.0)
Monocytes Absolute: 0.5 10*3/uL (ref 0.1–1.0)
Monocytes Relative: 12 %
Neutro Abs: 2.1 10*3/uL (ref 1.7–7.7)
Neutrophils Relative %: 51 %
Platelets: 273 10*3/uL (ref 150–400)
RBC: 2.83 MIL/uL — ABNORMAL LOW (ref 3.87–5.11)
RDW: 11.1 % — ABNORMAL LOW (ref 11.5–15.5)
WBC: 4.2 10*3/uL (ref 4.0–10.5)
nRBC: 0 % (ref 0.0–0.2)

## 2021-07-24 LAB — CMP (CANCER CENTER ONLY)
ALT: 6 U/L (ref 0–44)
AST: 18 U/L (ref 15–41)
Albumin: 4 g/dL (ref 3.5–5.0)
Alkaline Phosphatase: 62 U/L (ref 38–126)
Anion gap: 6 (ref 5–15)
BUN: 12 mg/dL (ref 8–23)
CO2: 26 mmol/L (ref 22–32)
Calcium: 9.1 mg/dL (ref 8.9–10.3)
Chloride: 98 mmol/L (ref 98–111)
Creatinine: 0.87 mg/dL (ref 0.44–1.00)
GFR, Estimated: >60 mL/min (ref >60)
Glucose, Bld: 100 mg/dL — ABNORMAL HIGH (ref 70–99)
Potassium: 3.8 mmol/L (ref 3.5–5.1)
Sodium: 130 mmol/L — ABNORMAL LOW (ref 135–145)
Total Bilirubin: 0.3 mg/dL (ref 0.3–1.2)
Total Protein: 6.8 g/dL (ref 6.5–8.1)

## 2021-07-24 MED ORDER — ANAGRELIDE HCL 1 MG PO CAPS: ORAL_CAPSULE | ORAL | 1 refills | Status: DC

## 2021-07-24 NOTE — Progress Notes (Signed)
[place]   [phone] Fax:(336) [phone]   Clinic Follow up Note   Patient Care Team: [name], [name], MD as PCP - General Angelia Mould, MD (Vascular Surgery) Ara Kussmaul, MD (Ophthalmology) Nahser, Wonda Cheng, MD as Consulting Physician (Cardiology) Truitt Merle, MD as Consulting Physician (Hematology) Eustace Moore, MD as Consulting Physician (Neurosurgery)  Date of Service:  07/24/2021  CHIEF COMPLAINT: f/u of MPN  CURRENT THERAPY:  Anagrelide, starting 06/10/15, currently 1 mg daily 5 days a week  ASSESSMENT & PLAN:  Paula Griffith is a 76 y.o. female with   1. Myeloproliferative neoplasm-essential thrombocythemia, JAK2 mutation negative. -We previously reviewed the natural history of ET, most patients do well, major complication is thrombosis. -She was on Hydrea before starting in 05/2015, I stopped in 06/2015 due to her anemia. -She was switched to anagrelide for disease control in 05/2015.  -Her anagrelide dose has been titrated as needed. We will monitor her EKG periodically. She is currently on Anagrelide 1mg  daily except Tuesdays/thursdays.  -Labs reviewed, platelets normal, Hgb 8.9 (overall stable). Will continue Anagrelide 1mg  once daily 5 days a week -lab and F/u in 5 months    2. Arthritis and back pain, Fall   -She will continue follow-up with her PCP and pain management clinic. -Back pain is stable and moderate, she has no limitation on her activities  -Last fall in 12/2018, no recent falls. She has an alert necklace she can use in case of emergency or Fall.    3. Hypertension and other medical problems -She'll continue follow-up with her primary care physician -She has had mild dyspnea due to asthma last night and this morning. She carries her inhaler with her. -She has had Afib in recent years, currently resolved. I discussed Anagrelide have side effects on her heart function. Will monitor with [facility].    4.  Anemia -secondary to MPN and previous hydrea  -Fluctuating, but in stable range. Continue to monitor.     Plan -lab reviewed, stable -Continue anagrelide 1 mg daily for 5 days a week. -Lab and f/u in 5 months    No problem-specific Assessment & Plan notes found for this encounter.   INTERVAL HISTORY:  Paula Griffith is here for a follow up of MPN. She was last seen by me on 01/09/21. She presents to the clinic alone. She reports she is doing well overall. She told me about her recent experience with the flu shot-- she became sick for 2 weeks. She notes she was told by Dr. Alain Marion that her immune level is very high and she should not get any more flu shots. She also told me about the death of her sister since her last visit.   All other systems were reviewed with the patient and are negative.  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   Spondylolisthesis of lumbar region    Thrombocytopathy [facility]) 2011   Dr Ralene Ok  SURGICAL HISTORY: Past Surgical History:  Procedure Laterality Date   ABDOMINAL HYSTERECTOMY  1989   ANTERIOR CERVICAL DECOMP/DISCECTOMY FUSION N/A 09/22/2017   Procedure: ANTERIOR CERVICAL DECOMPRESSION AND FUSION CERVICAL THREE-FOUR.;  Surgeon: Eustace Moore, MD;  Location: [place];  Service: Neurosurgery;  Laterality: N/A;  anterior   BACK SURGERY     BREAST EXCISIONAL BIOPSY Left    BREAST SURGERY     Left   LUMBAR LAMINECTOMY/DECOMPRESSION MICRODISCECTOMY Left 01/06/2018   Procedure: laminectomy Lumbar five -Sacral one - left, Lumbar  two-Lumbar three - bilateral;  Surgeon: Eustace Moore, MD;  Location: [place];  Service: Neurosurgery;  Laterality: Left;    I have reviewed the social history and family history with the patient and they are unchanged from previous note.  ALLERGIES:  is allergic to benazepril, amlodipine, fluad [influenza vac a&b surf ant adj], other, codeine, and tramadol.  MEDICATIONS:  Current Outpatient Medications  Medication Sig Dispense Refill   albuterol (PROAIR HFA) 108 (90 Base) MCG/ACT inhaler Inhale 2 puffs into the lungs every 4 (four) hours as needed. For shortness of breath 18 g 5   amLODipine (NORVASC) 5 MG tablet TAKE 1 TABLET BY MOUTH EVERY DAY 90 tablet 3   anagrelide (AGRYLIN) 1 MG capsule TAKE 1 CAPSULE BY MOUTH DAILY EXCEPT 2 DAYS A WEEK (TUESDAYS AND THURSDAYS) DO NOT TAKE. 66 capsule 1   Ascorbic Acid (VITAMIN C) 1000 MG tablet Take 1,000 mg by mouth daily.     aspirin 325 MG EC tablet Take 325 mg by mouth daily after breakfast.     carvedilol (COREG) 25 MG tablet Take 1 tablet (25 mg total) by mouth 2 (two) times daily. 60 tablet 11   Cholecalciferol (VITAMIN D3) 2000 units TABS Take 2,000 Units by mouth daily.     cloNIDine (CATAPRES) 0.1 MG tablet TAKE 1 TABLET BY MOUTH 2 TIMES DAILY. 180 tablet 1   escitalopram (LEXAPRO) 5 MG tablet TAKE 1 TABLET (5 MG TOTAL) BY MOUTH DAILY. 90 tablet 1   Fluticasone-Salmeterol (ADVAIR) 250-50 MCG/DOSE AEPB Please specify directions, refills and quantity 1 each 11   furosemide (LASIX) 20 MG tablet TAKE 1-2 TABLETS (20-40 MG TOTAL) BY MOUTH DAILY AS NEEDED. 180 tablet 1   gabapentin (NEURONTIN) 100 MG capsule TAKE 1 CAPSULE BY MOUTH THREE TIMES A DAY 270 capsule 1   Multiple Vitamins-Minerals (ADULT GUMMY PO) Take 2 tablets by mouth daily.     potassium chloride (KLOR-CON) 10 MEQ tablet TAKE 1 TABLET BY MOUTH EVERY DAY 90 tablet 3   trolamine salicylate (ASPERCREME) 10 % cream Apply 1 application topically 2 (two) times daily as needed for muscle  pain.     valsartan-hydrochlorothiazide (DIOVAN-HCT) 320-25 MG tablet TAKE 1 TABLET BY MOUTH EVERY DAY 90 tablet 3   No current facility-administered medications for this visit.    PHYSICAL EXAMINATION: ECOG PERFORMANCE STATUS: 0 - Asymptomatic  Vitals:   07/24/21 1323  BP: (!) 155/65  Pulse: (!) 56  Resp: 18  Temp: 98.6 F (37 C)  SpO2: 100%   Wt Readings from Last 3 Encounters:  07/24/21 133 lb 8 oz (60.6 kg)  07/14/21 133 lb 12.8 oz (60.7 kg)  04/28/21 131 lb 6.4 oz (59.6 kg)     GENERAL:alert, no distress and comfortable SKIN: skin color normal, no rashes or significant lesions EYES: normal, Conjunctiva are pink and non-injected, sclera clear  NEURO: alert & oriented x 3 with fluent speech  LABORATORY DATA:  I have reviewed the data  as listed CBC Latest Ref Rng & Units 07/24/2021 04/28/2021 04/10/2021  WBC 4.0 - 10.5 K/uL 4.2 5.4 7.5  Hemoglobin 12.0 - 15.0 g/dL 8.9(L) 9.1(L) 8.8(L)  Hematocrit 36.0 - 46.0 % 26.5(L) 27.4(L) 25.8(L)  Platelets 150 - 400 K/uL 273 358.0 244     CMP Latest Ref Rng & Units 07/24/2021 04/28/2021 04/10/2021  Glucose 70 - 99 mg/dL 100(H) 143(H) 156(H)  BUN 8 - 23 mg/dL 12 15 23   Creatinine 0.44 - 1.00 mg/dL 0.87 0.85 0.97  Sodium 135 - 145 mmol/L 130(L) 136 128(L)  Potassium 3.5 - 5.1 mmol/L 3.8 3.6 4.0  Chloride 98 - 111 mmol/L 98 101 96(L)  CO2 22 - 32 mmol/L 26 26 24   Calcium 8.9 - 10.3 mg/dL 9.1 9.4 9.3  Total Protein 6.5 - 8.1 g/dL 6.8 6.9 7.3  Total Bilirubin 0.3 - 1.2 mg/dL 0.3 0.4 0.8  Alkaline Phos 38 - 126 U/L 62 47 73  AST 15 - 41 U/L 18 18 62(H)  ALT 0 - 44 U/L 6 6 30       RADIOGRAPHIC STUDIES: I have personally reviewed the radiological images as listed and agreed with the findings in the report. No results found.    No orders of the defined types were placed in this encounter.  All questions were answered. The patient knows to call the clinic with any problems, questions or concerns. No barriers to learning was  detected. The total time spent in the appointment was 25 minutes.     Truitt Merle, MD 07/24/2021   I, Wilburn Mylar, am acting as scribe for Truitt Merle, MD.   I have reviewed the above documentation for accuracy and completeness, and I agree with the above.

## 2021-07-30 ENCOUNTER — Other Ambulatory Visit: Payer: Self-pay

## 2021-07-30 ENCOUNTER — Ambulatory Visit (INDEPENDENT_AMBULATORY_CARE_PROVIDER_SITE_OTHER): Payer: Medicare PPO | Admitting: Internal Medicine

## 2021-07-30 ENCOUNTER — Encounter: Payer: Self-pay | Admitting: Internal Medicine

## 2021-07-30 DIAGNOSIS — J4531 Mild persistent asthma with (acute) exacerbation: Secondary | ICD-10-CM | POA: Diagnosis not present

## 2021-07-30 DIAGNOSIS — H3582 Retinal ischemia: Secondary | ICD-10-CM

## 2021-07-30 DIAGNOSIS — R269 Unspecified abnormalities of gait and mobility: Secondary | ICD-10-CM

## 2021-07-30 DIAGNOSIS — F4321 Adjustment disorder with depressed mood: Secondary | ICD-10-CM

## 2021-07-30 DIAGNOSIS — I10 Essential (primary) hypertension: Secondary | ICD-10-CM | POA: Diagnosis not present

## 2021-07-30 MED ORDER — CLONIDINE HCL 0.1 MG PO TABS: 0.1000 mg | ORAL_TABLET | Freq: Two times a day (BID) | ORAL | 1 refills | Status: DC | PRN

## 2021-07-30 MED ORDER — CLONIDINE 0.1 MG/24HR TD PTWK: 0.1000 mg | MEDICATED_PATCH | TRANSDERMAL | 12 refills | Status: DC

## 2021-07-30 NOTE — Assessment & Plan Note (Signed)
F/u w/Dr Katy Fitch

## 2021-07-30 NOTE — Assessment & Plan Note (Signed)
Coping well. 

## 2021-07-30 NOTE — Progress Notes (Signed)
Subjective:  Patient ID: Paula Griffith, female    DOB: 04/27/1946  Age: 76 y.o. MRN: [id number]  CC: Follow-up (3 month f/u)   HPI Paula Griffith presents for HTN, grief, asthma f/u Pt ran out of Clonidine tabs 34 yo sister died on July 30, 2021  Outpatient Medications Prior to Visit  Medication Sig Dispense Refill   albuterol (PROAIR HFA) 108 (90 Base) MCG/ACT inhaler Inhale 2 puffs into the lungs every 4 (four) hours as needed. For shortness of breath 18 g 5   amLODipine (NORVASC) 5 MG tablet TAKE 1 TABLET BY MOUTH EVERY DAY 90 tablet 3   anagrelide (AGRYLIN) 1 MG capsule TAKE 1 CAPSULE BY MOUTH DAILY EXCEPT 2 DAYS A WEEK (TUESDAYS AND THURSDAYS) DO NOT TAKE. 66 capsule 1   Ascorbic Acid (VITAMIN C) 1000 MG tablet Take 1,000 mg by mouth daily.     aspirin 325 MG EC tablet Take 325 mg by mouth daily after breakfast.     carvedilol (COREG) 25 MG tablet Take 1 tablet (25 mg total) by mouth 2 (two) times daily. 60 tablet 11   Cholecalciferol (VITAMIN D3) 2000 units TABS Take 2,000 Units by mouth daily.     escitalopram (LEXAPRO) 5 MG tablet TAKE 1 TABLET (5 MG TOTAL) BY MOUTH DAILY. 90 tablet 1   Fluticasone-Salmeterol (ADVAIR) 250-50 MCG/DOSE AEPB Please specify directions, refills and quantity 1 each 11   furosemide (LASIX) 20 MG tablet TAKE 1-2 TABLETS (20-40 MG TOTAL) BY MOUTH DAILY AS NEEDED. 180 tablet 1   gabapentin (NEURONTIN) 100 MG capsule TAKE 1 CAPSULE BY MOUTH THREE TIMES A DAY 270 capsule 1   Multiple Vitamins-Minerals (ADULT GUMMY PO) Take 2 tablets by mouth daily.     potassium chloride (KLOR-CON) 10 MEQ tablet TAKE 1 TABLET BY MOUTH EVERY DAY 90 tablet 3   trolamine salicylate (ASPERCREME) 10 % cream Apply 1 application topically 2 (two) times daily as needed for muscle pain.     valsartan-hydrochlorothiazide (DIOVAN-HCT) 320-25 MG tablet TAKE 1 TABLET BY MOUTH EVERY DAY 90 tablet 3   cloNIDine (CATAPRES) 0.1 MG tablet TAKE 1 TABLET BY MOUTH 2 TIMES DAILY. 180 tablet 1   No  facility-administered medications prior to visit.    ROS: Review of Systems  Constitutional:  Negative for activity change, appetite change, chills, fatigue and unexpected weight change.  HENT:  Negative for congestion, mouth sores and sinus pressure.   Eyes:  Negative for visual disturbance.  Respiratory:  Negative for cough and chest tightness.   Gastrointestinal:  Negative for abdominal pain and nausea.  Genitourinary:  Negative for difficulty urinating, frequency and vaginal pain.  Musculoskeletal:  Positive for arthralgias and gait problem. Negative for back pain.  Skin:  Negative for pallor and rash.  Neurological:  Negative for dizziness, tremors, weakness, numbness and headaches.  Psychiatric/Behavioral:  Negative for confusion, sleep disturbance and suicidal ideas.    Objective:  BP (!) 182/68 (BP Location: Left Arm)    Pulse 68    Temp 98.5 F (36.9 C) (Oral)    Ht 5\' 2"  (1.575 m)    Wt 130 lb 12.8 oz (59.3 kg)    SpO2 98%    BMI 23.92 kg/m   BP Readings from Last 3 Encounters:  07/30/21 (!) 182/68  07/24/21 (!) 155/65  07/14/21 (!) 168/62    Wt Readings from Last 3 Encounters:  07/30/21 130 lb 12.8 oz (59.3 kg)  07/24/21 133 lb 8 oz (60.6 kg)  07/14/21 133 lb 12.8  oz (60.7 kg)    Physical Exam Constitutional:      General: She is not in acute distress.    Appearance: She is well-developed.  HENT:     Head: Normocephalic.     Right Ear: External ear normal.     Left Ear: External ear normal.     Nose: Nose normal.  Eyes:     General:        Right eye: No discharge.        Left eye: No discharge.     Conjunctiva/sclera: Conjunctivae normal.     Pupils: Pupils are equal, round, and reactive to light.  Neck:     Thyroid: No thyromegaly.     Vascular: No JVD.     Trachea: No tracheal deviation.  Cardiovascular:     Rate and Rhythm: Normal rate and regular rhythm.     Heart sounds: Normal heart sounds.  Pulmonary:     Effort: No respiratory distress.      Breath sounds: No stridor. No wheezing.  Abdominal:     General: Bowel sounds are normal. There is no distension.     Palpations: Abdomen is soft. There is no mass.     Tenderness: There is no abdominal tenderness. There is no guarding or rebound.  Musculoskeletal:        General: Tenderness present.     Cervical back: Normal range of motion and neck supple. No rigidity.  Lymphadenopathy:     Cervical: No cervical adenopathy.  Skin:    Findings: No erythema or rash.  Neurological:     Mental Status: She is oriented to person, place, and time.     Cranial Nerves: No cranial nerve deficit.     Motor: No abnormal muscle tone.     Coordination: Coordination abnormal.     Gait: Gait abnormal.     Deep Tendon Reflexes: Reflexes normal.  Psychiatric:        Behavior: Behavior normal.        Thought Content: Thought content normal.        Judgment: Judgment normal.   Using a cane   Lab Results  Component Value Date   WBC 4.2 07/24/2021   HGB 8.9 (L) 07/24/2021   HCT 26.5 (L) 07/24/2021   PLT 273 07/24/2021   GLUCOSE 100 (H) 07/24/2021   CHOL 158 03/01/2018   TRIG 84.0 03/01/2018   HDL 47.10 03/01/2018   LDLCALC 94 03/01/2018   ALT 6 07/24/2021   AST 18 07/24/2021   NA 130 (L) 07/24/2021   K 3.8 07/24/2021   CL 98 07/24/2021   CREATININE 0.87 07/24/2021   BUN 12 07/24/2021   CO2 26 07/24/2021   TSH 0.66 04/28/2021   INR 0.97 12/29/2017   HGBA1C 5.4 04/28/2021    US BREAST LTD UNI RIGHT INC AXILLA  Result Date: 07/11/2020 CLINICAL DATA:  The patient was called back for a possible mass in the right periareolar region. EXAM: ULTRASOUND OF THE RIGHT BREAST COMPARISON:  Previous exam(s). FINDINGS: On physical exam, no suspicious lumps are identified. Targeted ultrasound is performed, showing ductal ectasia in the periareolar region inferiorly and medially accounting for the mammographic finding. IMPRESSION: Ductal ectasia.  No evidence of malignancy. RECOMMENDATION: Annual  screening mammography. I have discussed the findings and recommendations with the patient. If applicable, a reminder letter will be sent to the patient regarding the next appointment. BI-RADS CATEGORY  2: Benign. Electronically Signed   By: Dorise Bullion III M.D  On: 07/11/2020 13:25    Assessment & Plan:   Problem List Items Addressed This Visit     Asthma    Continue on Symbicort.  Use a rescue inhaler as needed-Ventolin      Essential hypertension    Not better Start Catapress patch Catapress tabs prn      Relevant Medications   cloNIDine (CATAPRES) 0.1 MG tablet   cloNIDine (CATAPRES - DOSED IN MG/24 HR) 0.1 mg/24hr patch   Gait disorder    Using a cane      Grief    Coping well      Retinal ischemia    F/u w/Dr Katy Fitch         Meds ordered this encounter  Medications   cloNIDine (CATAPRES) 0.1 MG tablet    Sig: Take 1 tablet (0.1 mg total) by mouth 2 (two) times daily as needed. Take if BP> 160/110    Dispense:  180 tablet    Refill:  1   cloNIDine (CATAPRES - DOSED IN MG/24 HR) 0.1 mg/24hr patch    Sig: Place 1 patch (0.1 mg total) onto the skin once a week.    Dispense:  4 patch    Refill:  12      Follow-up: Return in about 2 months (around 09/27/2021) for a follow-up visit.  [name], MD

## 2021-07-30 NOTE — Assessment & Plan Note (Signed)
Continue on Symbicort.  Use a rescue inhaler as needed-Ventolin

## 2021-07-30 NOTE — Assessment & Plan Note (Signed)
Using a cane 

## 2021-07-30 NOTE — Assessment & Plan Note (Signed)
Not better Start Catapress patch Catapress tabs prn

## 2021-08-16 ENCOUNTER — Other Ambulatory Visit: Payer: Self-pay | Admitting: Internal Medicine

## 2021-09-04 ENCOUNTER — Other Ambulatory Visit: Payer: Self-pay | Admitting: Internal Medicine

## 2021-09-17 ENCOUNTER — Other Ambulatory Visit: Payer: Self-pay | Admitting: Internal Medicine

## 2021-09-17 DIAGNOSIS — H02822 Cysts of right lower eyelid: Secondary | ICD-10-CM | POA: Diagnosis not present

## 2021-09-17 DIAGNOSIS — H25813 Combined forms of age-related cataract, bilateral: Secondary | ICD-10-CM | POA: Diagnosis not present

## 2021-09-29 ENCOUNTER — Ambulatory Visit (INDEPENDENT_AMBULATORY_CARE_PROVIDER_SITE_OTHER): Payer: Medicare PPO

## 2021-09-29 ENCOUNTER — Telehealth: Payer: Self-pay

## 2021-09-29 DIAGNOSIS — Z0000 Encounter for general adult medical examination without abnormal findings: Secondary | ICD-10-CM | POA: Diagnosis not present

## 2021-09-29 MED ORDER — CARVEDILOL 25 MG PO TABS: 25.0000 mg | ORAL_TABLET | Freq: Two times a day (BID) | ORAL | 11 refills | Status: DC

## 2021-09-29 MED ORDER — FUROSEMIDE 20 MG PO TABS: 20.0000 mg | ORAL_TABLET | Freq: Every day | ORAL | 1 refills | Status: DC | PRN

## 2021-09-29 NOTE — Telephone Encounter (Signed)
Rx sent 

## 2021-09-29 NOTE — Addendum Note (Signed)
Addended by: Rossie Muskrat on: 09/29/2021 11:26 AM ? ? Modules accepted: Orders ? ?

## 2021-09-29 NOTE — Patient Instructions (Signed)
Paula Griffith , ?Thank you for taking time to come for your Medicare Wellness Visit. I appreciate your ongoing commitment to your health goals. Please review the following plan we discussed and let me know if I can assist you in the future.  ? ?Screening recommendations/referrals: ?Colonoscopy: 07/26/2012; due every 10 years ?Mammogram: 06/19/2020; due every 1-2 years ?Bone Density: never done ?Recommended yearly ophthalmology/optometry visit for glaucoma screening and checkup ?Recommended yearly dental visit for hygiene and checkup ? ?Vaccinations: ?Influenza vaccine: 03/10/2021; had allergic reaction ?Pneumococcal vaccine: 12/09/2012, 08/24/2016 ?Tdap vaccine: 04/20/2016; due every 10 years ?Shingles vaccine: never done   ?Covid-19: 08/21/2019, 09/19/2019, 04/20/2020 ? ?Advanced directives: Yes ? ?Conditions/risks identified: Yes ? ?Next appointment: Please schedule your next Medicare Wellness Visit with your Nurse Health Advisor in 1 year or 366 days by calling [phone]. ? ? ?Preventive Care 18 Years and Older, Female ?Preventive care refers to lifestyle choices and visits with your health care provider that can promote health and wellness. ?What does preventive care include? ?A yearly physical exam. This is also called an annual well check. ?Dental exams once or twice a year. ?Routine eye exams. Ask your health care provider how often you should have your eyes checked. ?Personal lifestyle choices, including: ?Daily care of your teeth and gums. ?Regular physical activity. ?Eating a healthy diet. ?Avoiding tobacco and drug use. ?Limiting alcohol use. ?Practicing safe sex. ?Taking low-dose aspirin every day. ?Taking vitamin and mineral supplements as recommended by your health care provider. ?What happens during an annual well check? ?The services and screenings done by your health care provider during your annual well check will depend on your age, overall health, lifestyle risk factors, and family history of  disease. ?Counseling  ?Your health care provider may ask you questions about your: ?Alcohol use. ?Tobacco use. ?Drug use. ?Emotional well-being. ?Home and relationship well-being. ?Sexual activity. ?Eating habits. ?History of falls. ?Memory and ability to understand (cognition). ?Work and work Statistician. ?Reproductive health. ?Screening  ?You may have the following tests or measurements: ?Height, weight, and BMI. ?Blood pressure. ?Lipid and cholesterol levels. These may be checked every 5 years, or more frequently if you are over 72 years old. ?Skin check. ?Lung cancer screening. You may have this screening every year starting at age 77 if you have a 30-pack-year history of smoking and currently smoke or have quit within the past 15 years. ?Fecal occult blood test (FOBT) of the stool. You may have this test every year starting at age 78. ?Flexible sigmoidoscopy or colonoscopy. You may have a sigmoidoscopy every 5 years or a colonoscopy every 10 years starting at age 42. ?Hepatitis C blood test. ?Hepatitis B blood test. ?Sexually transmitted disease (STD) testing. ?Diabetes screening. This is done by checking your blood sugar (glucose) after you have not eaten for a while (fasting). You may have this done every 1-3 years. ?Bone density scan. This is done to screen for osteoporosis. You may have this done starting at age 85. ?Mammogram. This may be done every 1-2 years. Talk to your health care provider about how often you should have regular mammograms. ?Talk with your health care provider about your test results, treatment options, and if necessary, the need for more tests. ?Vaccines  ?Your health care provider may recommend certain vaccines, such as: ?Influenza vaccine. This is recommended every year. ?Tetanus, diphtheria, and acellular pertussis (Tdap, Td) vaccine. You may need a Td booster every 10 years. ?Zoster vaccine. You may need this after age 49. ?Pneumococcal 13-valent conjugate (PCV13)  vaccine. One  dose is recommended after age 28. ?Pneumococcal polysaccharide (PPSV23) vaccine. One dose is recommended after age 49. ?Talk to your health care provider about which screenings and vaccines you need and how often you need them. ?This information is not intended to replace advice given to you by your health care provider. Make sure you discuss any questions you have with your health care provider. ?Document Released: 07/12/2015 Document Revised: 03/04/2016 Document Reviewed: 04/16/2015 ?Elsevier Interactive Patient Education ? 2017 [place]. ? ?Fall Prevention in the Home ?Falls can cause injuries. They can happen to people of all ages. There are many things you can do to make your home safe and to help prevent falls. ?What can I do on the outside of my home? ?Regularly fix the edges of walkways and driveways and fix any cracks. ?Remove anything that might make you trip as you walk through a door, such as a raised step or threshold. ?Trim any bushes or trees on the path to your home. ?Use bright outdoor lighting. ?Clear any walking paths of anything that might make someone trip, such as rocks or tools. ?Regularly check to see if handrails are loose or broken. Make sure that both sides of any steps have handrails. ?Any raised decks and porches should have guardrails on the edges. ?Have any leaves, snow, or ice cleared regularly. ?Use sand or salt on walking paths during winter. ?Clean up any spills in your garage right away. This includes oil or grease spills. ?What can I do in the bathroom? ?Use night lights. ?Install grab bars by the toilet and in the tub and shower. Do not use towel bars as grab bars. ?Use non-skid mats or decals in the tub or shower. ?If you need to sit down in the shower, use a plastic, non-slip stool. ?Keep the floor dry. Clean up any water that spills on the floor as soon as it happens. ?Remove soap buildup in the tub or shower regularly. ?Attach bath mats securely with double-sided  non-slip rug tape. ?Do not have throw rugs and other things on the floor that can make you trip. ?What can I do in the bedroom? ?Use night lights. ?Make sure that you have a light by your bed that is easy to reach. ?Do not use any sheets or blankets that are too big for your bed. They should not hang down onto the floor. ?Have a firm chair that has side arms. You can use this for support while you get dressed. ?Do not have throw rugs and other things on the floor that can make you trip. ?What can I do in the kitchen? ?Clean up any spills right away. ?Avoid walking on wet floors. ?Keep items that you use a lot in easy-to-reach places. ?If you need to reach something above you, use a strong step stool that has a grab bar. ?Keep electrical cords out of the way. ?Do not use floor polish or wax that makes floors slippery. If you must use wax, use non-skid floor wax. ?Do not have throw rugs and other things on the floor that can make you trip. ?What can I do with my stairs? ?Do not leave any items on the stairs. ?Make sure that there are handrails on both sides of the stairs and use them. Fix handrails that are broken or loose. Make sure that handrails are as long as the stairways. ?Check any carpeting to make sure that it is firmly attached to the stairs. Fix any carpet that is loose  or worn. ?Avoid having throw rugs at the top or bottom of the stairs. If you do have throw rugs, attach them to the floor with carpet tape. ?Make sure that you have a light switch at the top of the stairs and the bottom of the stairs. If you do not have them, ask someone to add them for you. ?What else can I do to help prevent falls? ?Wear shoes that: ?Do not have high heels. ?Have rubber bottoms. ?Are comfortable and fit you well. ?Are closed at the toe. Do not wear sandals. ?If you use a stepladder: ?Make sure that it is fully opened. Do not climb a closed stepladder. ?Make sure that both sides of the stepladder are locked into place. ?Ask  someone to hold it for you, if possible. ?Clearly mark and make sure that you can see: ?Any grab bars or handrails. ?First and last steps. ?Where the edge of each step is. ?Use tools that help you move around (mobility

## 2021-09-29 NOTE — Telephone Encounter (Signed)
Patient requesting refills on: Carvedilol and Furosemide to be sent to [facility] at [place].  ?

## 2021-09-29 NOTE — Progress Notes (Signed)
?I connected with Paula Griffith today by telephone and verified that I am speaking with the correct person using two identifiers. ?Location patient: home ?Location provider: work ?Persons participating in the virtual visit: patient, provider. ?  ?I discussed the limitations, risks, security and privacy concerns of performing an evaluation and management service by telephone and the availability of in person appointments. I also discussed with the patient that there may be a patient responsible charge related to this service. The patient expressed understanding and verbally consented to this telephonic visit.  ?  ?Interactive audio and video telecommunications were attempted between this provider and patient, however failed, due to patient having technical difficulties OR patient did not have access to video capability.  We continued and completed visit with audio only. ? ?Some vital signs may be absent or patient reported.  ? ?Time Spent with patient on telephone encounter: 40 minutes ? ?Subjective:  ? Paula Griffith is a 76 y.o. female who presents for Medicare Annual (Subsequent) preventive examination. ? ?Review of Systems    ? ?Cardiac Risk Factors include: advanced age (>69mn, >>75women);hypertension ? ?   ?Objective:  ?  ?There were no vitals filed for this visit. ?There is no height or weight on file to calculate BMI. ? ? ?  09/29/2021  ?  9:49 AM 12/26/2019  ?  9:26 AM 02/22/2019  ?  9:33 AM 01/06/2018  ?  6:28 AM 12/29/2017  ? 10:40 AM 09/21/2017  ? 11:28 AM 08/25/2017  ? 10:42 AM  ?Advanced Directives  ?Does Patient Have a Medical Advance Directive? Yes Yes Yes No No No Yes  ?Type of Advance Directive Living will;Healthcare Power of [place]    [place]  ?Does patient want to make changes to medical advance directive? No - Patient declined  No - Patient declined      ?Copy of [place] Chart? No - copy requested      No  - copy requested  ?Would patient like information on creating a medical advance directive?    No - Patient declined No - Patient declined No - Patient declined   ? ? ?Current Medications (verified) ?Outpatient Encounter Medications as of 09/29/2021  ?Medication Sig  ? albuterol (PROAIR HFA) 108 (90 Base) MCG/ACT inhaler Inhale 2 puffs into the lungs every 4 (four) hours as needed. For shortness of breath  ? amLODipine (NORVASC) 5 MG tablet TAKE 1 TABLET BY MOUTH EVERY DAY  ? anagrelide (AGRYLIN) 1 MG capsule TAKE 1 CAPSULE BY MOUTH DAILY EXCEPT 2 DAYS A WEEK (TUESDAYS AND THURSDAYS) DO NOT TAKE.  ? Ascorbic Acid (VITAMIN C) 1000 MG tablet Take 1,000 mg by mouth daily.  ? aspirin 325 MG EC tablet Take 325 mg by mouth daily after breakfast.  ? carvedilol (COREG) 25 MG tablet Take 1 tablet (25 mg total) by mouth 2 (two) times daily.  ? Cholecalciferol (VITAMIN D3) 2000 units TABS Take 2,000 Units by mouth daily.  ? cloNIDine (CATAPRES - DOSED IN MG/24 HR) 0.1 mg/24hr patch Place 1 patch (0.1 mg total) onto the skin once a week.  ? cloNIDine (CATAPRES) 0.1 MG tablet Take 1 tablet (0.1 mg total) by mouth 2 (two) times daily as needed. Take if BP> 160/110  ? escitalopram (LEXAPRO) 5 MG tablet TAKE 1 TABLET (5 MG TOTAL) BY MOUTH DAILY.  ? Fluticasone-Salmeterol (ADVAIR) 250-50 MCG/DOSE AEPB Please specify directions, refills and quantity  ? furosemide (LASIX)  20 MG tablet TAKE 1-2 TABLETS (20-40 MG TOTAL) BY MOUTH DAILY AS NEEDED.  ? gabapentin (NEURONTIN) 100 MG capsule TAKE 1 CAPSULE BY MOUTH THREE TIMES A DAY  ? Multiple Vitamins-Minerals (ADULT GUMMY PO) Take 2 tablets by mouth daily.  ? potassium chloride (KLOR-CON) 10 MEQ tablet TAKE 1 TABLET BY MOUTH EVERY DAY  ? trolamine salicylate (ASPERCREME) 10 % cream Apply 1 application topically 2 (two) times daily as needed for muscle pain.  ? valsartan-hydrochlorothiazide (DIOVAN-HCT) 320-25 MG tablet TAKE 1 TABLET BY MOUTH EVERY DAY  ? [DISCONTINUED]  losartan-hydrochlorothiazide (HYZAAR) 100-25 MG tablet Take 1 tablet by mouth daily.  ? ?No facility-administered encounter medications on file as of 09/29/2021.  ? ? ?Allergies (verified) ?Benazepril, Amlodipine, Fluad [influenza vac a&b surf ant adj], Other, Codeine, and Tramadol  ? ?History: ?Past Medical History:  ?Diagnosis Date  ? Allergic rhinitis   ? Asthma   ? Cervicalgia   ? Colon polyps   ? Complication of anesthesia   ? Diverticulosis of colon   ? Glucose intolerance (impaired glucose tolerance)   ? HTN (hypertension)   ? LBP (low back pain)   ? Leukemia ([place])   ? Osteoarthritis   ? PONV (postoperative nausea and vomiting)   ? PVD (peripheral vascular disease) ([place]) 2011  ? Right Carotid Dr Scot Dock  ? 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 Spondylolisthesis of lumbar region   ? Thrombocytopathy [facility]) 2011  ? Dr Ralene Ok  ? ?Past Surgical History:  ?Procedure Laterality Date  ? ABDOMINAL HYSTERECTOMY  1989  ? ANTERIOR CERVICAL DECOMP/DISCECTOMY FUSION N/A 09/22/2017  ? Procedure: ANTERIOR CERVICAL DECOMPRESSION AND FUSION CERVICAL THREE-FOUR.;  Surgeon: Eustace Moore, MD;  Location: [place];  Service: Neurosurgery;  Laterality: N/A;  anterior  ? BACK SURGERY    ? BREAST EXCISIONAL BIOPSY Left   ? BREAST SURGERY    ? Left  ? LUMBAR LAMINECTOMY/DECOMPRESSION MICRODISCECTOMY Left 01/06/2018  ? Procedure: laminectomy Lumbar five -Sacral one - left, Lumbar two-Lumbar three - bilateral;  Surgeon: Eustace Moore, MD;  Location: [facility];  Service: Neurosurgery;  Laterality: Left;  ? ?Family History  ?Problem Relation Age of Onset  ? Pancreatic cancer Mother   ? Arthritis Mother   ?     RA  ? Early death Father 64  ?     MI  ? Coronary artery disease Other   ?     Female 1st degree relative Female <60  ? Lung cancer Other   ? Diabetes Sister   ? Breast cancer Maternal Grandmother   ? Colon cancer Neg Hx   ? Stomach cancer Neg Hx   ? ?Social History  ? ?Socioeconomic History  ? Marital status: Widowed  ?  Spouse name: Not on  file  ? Number of children: Not on file  ? Years of education: Not on file  ? Highest education level: Not on file  ?Occupational History  ? Occupation: Retired  ?  Comment: but wking  ?Tobacco Use  ? Smoking status: Never  ? Smokeless tobacco: Never  ?Vaping Use  ? Vaping Use: Never used  ?Substance and Sexual Activity  ? Alcohol use: No  ?  Alcohol/week: 0.0 standard drinks  ? Drug use: No  ? Sexual activity: Not on file  ?Other Topics Concern  ? Not on file  ?Social History Narrative  ? Not on file  ? ?Social Determinants of Health  ? ?Financial Resource Strain: Low Risk   ? Difficulty of  Paying Living Expenses: Not hard at all  ?Food Insecurity: No Food Insecurity  ? Worried About Charity fundraiser in the Last Year: Never true  ? Ran Out of Food in the Last Year: Never true  ?Transportation Needs: No Transportation Needs  ? Lack of Transportation (Medical): No  ? Lack of Transportation (Non-Medical): No  ?Physical Activity: Sufficiently Active  ? Days of Exercise per Week: 7 days  ? Minutes of Exercise per Session: 30 min  ?Stress: No Stress Concern Present  ? Feeling of Stress : Not at all  ?Social Connections: Moderately Integrated  ? Frequency of Communication with Friends and Family: More than three times a week  ? Frequency of Social Gatherings with Friends and Family: More than three times a week  ? Attends Religious Services: More than 4 times per year  ? Active Member of Clubs or Organizations: Yes  ? Attends Archivist Meetings: More than 4 times per year  ? Marital Status: Widowed  ? ? ?Tobacco Counseling ?Counseling given: Not Answered ? ? ?Clinical Intake: ? ?Pre-visit preparation completed: Yes ? ?Pain : No/denies pain ? ?  ? ?Nutritional Risks: Non-healing wound ?Diabetes: No ? ?How often do you need to have someone help you when you read instructions, pamphlets, or other written materials from your doctor or pharmacy?: 1 - Never ?What is the last grade level you completed in school?:  [employer]; Master Degree in [place]; Degree in [place]; Degree in Sociology/Criminal Justice ? ?Diabetic? no ? ?Interpreter Needed?: No ? ?Information entered by :: Lisette Abu, LPN ? ? ?Activities of

## 2021-10-01 ENCOUNTER — Ambulatory Visit: Payer: Medicare PPO | Admitting: Internal Medicine

## 2021-10-14 ENCOUNTER — Ambulatory Visit: Payer: Medicare PPO | Admitting: Internal Medicine

## 2021-10-14 ENCOUNTER — Encounter: Payer: Self-pay | Admitting: Internal Medicine

## 2021-10-14 VITALS — BP 130/58 | HR 75 | Temp 98.9°F | Ht 62.0 in | Wt 128.0 lb

## 2021-10-14 DIAGNOSIS — J4531 Mild persistent asthma with (acute) exacerbation: Secondary | ICD-10-CM

## 2021-10-14 DIAGNOSIS — D649 Anemia, unspecified: Secondary | ICD-10-CM

## 2021-10-14 DIAGNOSIS — M5442 Lumbago with sciatica, left side: Secondary | ICD-10-CM

## 2021-10-14 DIAGNOSIS — M545 Low back pain, unspecified: Secondary | ICD-10-CM | POA: Diagnosis not present

## 2021-10-14 DIAGNOSIS — F419 Anxiety disorder, unspecified: Secondary | ICD-10-CM

## 2021-10-14 DIAGNOSIS — I10 Essential (primary) hypertension: Secondary | ICD-10-CM

## 2021-10-14 DIAGNOSIS — M5432 Sciatica, left side: Secondary | ICD-10-CM | POA: Diagnosis not present

## 2021-10-14 DIAGNOSIS — J45909 Unspecified asthma, uncomplicated: Secondary | ICD-10-CM | POA: Diagnosis not present

## 2021-10-14 DIAGNOSIS — F439 Reaction to severe stress, unspecified: Secondary | ICD-10-CM

## 2021-10-14 DIAGNOSIS — G8929 Other chronic pain: Secondary | ICD-10-CM

## 2021-10-14 LAB — CBC WITH DIFFERENTIAL/PLATELET
Basophils Absolute: 0.1 10*3/uL (ref 0.0–0.1)
Basophils Relative: 1.1 % (ref 0.0–3.0)
Eosinophils Absolute: 0.1 10*3/uL (ref 0.0–0.7)
Eosinophils Relative: 1.6 % (ref 0.0–5.0)
HCT: 25.6 % — ABNORMAL LOW (ref 36.0–46.0)
Hemoglobin: 8.7 g/dL — ABNORMAL LOW (ref 12.0–15.0)
Lymphocytes Relative: 26.5 % (ref 12.0–46.0)
Lymphs Abs: 1.3 10*3/uL (ref 0.7–4.0)
MCHC: 33.9 g/dL (ref 30.0–36.0)
MCV: 94 fl (ref 78.0–100.0)
Monocytes Absolute: 0.6 10*3/uL (ref 0.1–1.0)
Monocytes Relative: 11.7 % (ref 3.0–12.0)
Neutro Abs: 2.9 10*3/uL (ref 1.4–7.7)
Neutrophils Relative %: 59.1 % (ref 43.0–77.0)
Platelets: 262 10*3/uL (ref 150.0–400.0)
RBC: 2.72 Mil/uL — ABNORMAL LOW (ref 3.87–5.11)
RDW: 11.3 % — ABNORMAL LOW (ref 11.5–15.5)
WBC: 4.9 10*3/uL (ref 4.0–10.5)

## 2021-10-14 LAB — COMPREHENSIVE METABOLIC PANEL
ALT: 7 U/L (ref 0–35)
AST: 21 U/L (ref 0–37)
Albumin: 4.1 g/dL (ref 3.5–5.2)
Alkaline Phosphatase: 50 U/L (ref 39–117)
BUN: 16 mg/dL (ref 6–23)
CO2: 27 mEq/L (ref 19–32)
Calcium: 9.5 mg/dL (ref 8.4–10.5)
Chloride: 90 mEq/L — ABNORMAL LOW (ref 96–112)
Creatinine, Ser: 0.94 mg/dL (ref 0.40–1.20)
GFR: 59.15 mL/min — ABNORMAL LOW (ref >60.00)
Glucose, Bld: 100 mg/dL — ABNORMAL HIGH (ref 70–99)
Potassium: 3.9 mEq/L (ref 3.5–5.1)
Sodium: 124 mEq/L — ABNORMAL LOW (ref 135–145)
Total Bilirubin: 0.4 mg/dL (ref 0.2–1.2)
Total Protein: 6.9 g/dL (ref 6.0–8.3)

## 2021-10-14 LAB — TSH: TSH: 0.93 u[IU]/mL (ref 0.35–5.50)

## 2021-10-14 NOTE — Assessment & Plan Note (Signed)
Cont on Diovan HCT  320/25, Clonidine twice daily prn. Continue Coreg 25 mg bid, Norvasc.  ? ?Better Catapress patch ?Catapress tabs prn ?

## 2021-10-14 NOTE — Assessment & Plan Note (Addendum)
Oxycodone w/Pnenergan prn. Gabapentin ?Aspercream prn ? Potential benefits of an opioid and steroid  use as well as potential risks  and complications were explained to the patient and were aknowledged. ? ?Blue-Emu cream was recommended to use 2-3 times a day  ? ?

## 2021-10-14 NOTE — Patient Instructions (Signed)
Blue-Emu cream -- use 2-3 times a day ? ?

## 2021-10-14 NOTE — Assessment & Plan Note (Signed)
Better - her dtr moved out... ?D/c Lexapro ?

## 2021-10-14 NOTE — Progress Notes (Signed)
? ?Subjective:  ?Patient ID: SYLVIE MIFSUD, female    DOB: [birth year]  Age: 76 y.o. MRN: [id number] ? ?CC: No chief complaint on file. ? ? ?HPI ?Tad Moore presents for stress - better - her dtr moved out... ?F/u HTN - better ?F/u LBP ? ?Outpatient Medications Prior to Visit  ?Medication Sig Dispense Refill  ? albuterol (PROAIR HFA) 108 (90 Base) MCG/ACT inhaler Inhale 2 puffs into the lungs every 4 (four) hours as needed. For shortness of breath 18 g 5  ? amLODipine (NORVASC) 5 MG tablet TAKE 1 TABLET BY MOUTH EVERY DAY 90 tablet 3  ? anagrelide (AGRYLIN) 1 MG capsule TAKE 1 CAPSULE BY MOUTH DAILY EXCEPT 2 DAYS A WEEK (TUESDAYS AND THURSDAYS) DO NOT TAKE. 66 capsule 1  ? Ascorbic Acid (VITAMIN C) 1000 MG tablet Take 1,000 mg by mouth daily.    ? aspirin 325 MG EC tablet Take 325 mg by mouth daily after breakfast.    ? carvedilol (COREG) 25 MG tablet Take 1 tablet (25 mg total) by mouth 2 (two) times daily. 60 tablet 11  ? Cholecalciferol (VITAMIN D3) 2000 units TABS Take 2,000 Units by mouth daily.    ? cloNIDine (CATAPRES - DOSED IN MG/24 HR) 0.1 mg/24hr patch Place 1 patch (0.1 mg total) onto the skin once a week. 4 patch 12  ? cloNIDine (CATAPRES) 0.1 MG tablet Take 1 tablet (0.1 mg total) by mouth 2 (two) times daily as needed. Take if BP> 160/110 180 tablet 1  ? Fluticasone-Salmeterol (ADVAIR) 250-50 MCG/DOSE AEPB Please specify directions, refills and quantity 1 each 11  ? furosemide (LASIX) 20 MG tablet Take 1-2 tablets (20-40 mg total) by mouth daily as needed. 180 tablet 1  ? gabapentin (NEURONTIN) 100 MG capsule TAKE 1 CAPSULE BY MOUTH THREE TIMES A DAY 270 capsule 1  ? Multiple Vitamins-Minerals (ADULT GUMMY PO) Take 2 tablets by mouth daily.    ? potassium chloride (KLOR-CON) 10 MEQ tablet TAKE 1 TABLET BY MOUTH EVERY DAY 90 tablet 3  ? trolamine salicylate (ASPERCREME) 10 % cream Apply 1 application topically 2 (two) times daily as needed for muscle pain.    ? valsartan-hydrochlorothiazide  (DIOVAN-HCT) 320-25 MG tablet TAKE 1 TABLET BY MOUTH EVERY DAY 90 tablet 3  ? escitalopram (LEXAPRO) 5 MG tablet TAKE 1 TABLET (5 MG TOTAL) BY MOUTH DAILY. 90 tablet 1  ? ?No facility-administered medications prior to visit.  ? ? ?ROS: ?Review of Systems  ?Constitutional:  Positive for fatigue. Negative for activity change, appetite change, chills and unexpected weight change.  ?HENT:  Negative for congestion, mouth sores and sinus pressure.   ?Eyes:  Negative for visual disturbance.  ?Respiratory:  Negative for cough and chest tightness.   ?Gastrointestinal:  Negative for abdominal pain and nausea.  ?Genitourinary:  Negative for difficulty urinating, frequency and vaginal pain.  ?Musculoskeletal:  Positive for arthralgias, back pain and gait problem.  ?Skin:  Negative for pallor and rash.  ?Neurological:  Negative for dizziness, tremors, weakness, numbness and headaches.  ?Psychiatric/Behavioral:  Negative for confusion and sleep disturbance.   ? ?Objective:  ?BP (!) 130/58 (BP Location: Left Arm, Patient Position: Sitting, Cuff Size: Normal)   Pulse 75   Temp 98.9 ?F (37.2 ?C) (Oral)   Ht '5\' 2"'$  (1.575 m)   Wt 128 lb (58.1 kg)   SpO2 99%   BMI 23.41 kg/m?  ? ?BP Readings from Last 3 Encounters:  ?10/14/21 (!) 130/58  ?07/30/21 (!) 182/68  ?07/24/21 Marland Kitchen)  155/65  ? ? ?Wt Readings from Last 3 Encounters:  ?10/14/21 128 lb (58.1 kg)  ?07/30/21 130 lb 12.8 oz (59.3 kg)  ?07/24/21 133 lb 8 oz (60.6 kg)  ? ? ?Physical Exam ?Constitutional:   ?   General: She is not in acute distress. ?   Appearance: Normal appearance. She is well-developed.  ?HENT:  ?   Head: Normocephalic.  ?   Right Ear: External ear normal.  ?   Left Ear: External ear normal.  ?   Nose: Nose normal.  ?Eyes:  ?   General:     ?   Right eye: No discharge.     ?   Left eye: No discharge.  ?   Conjunctiva/sclera: Conjunctivae normal.  ?   Pupils: Pupils are equal, round, and reactive to light.  ?Neck:  ?   Thyroid: No thyromegaly.  ?   Vascular: No  JVD.  ?   Trachea: No tracheal deviation.  ?Cardiovascular:  ?   Rate and Rhythm: Normal rate and regular rhythm.  ?   Heart sounds: Normal heart sounds.  ?Pulmonary:  ?   Effort: No respiratory distress.  ?   Breath sounds: No stridor. No wheezing.  ?Abdominal:  ?   General: Bowel sounds are normal. There is no distension.  ?   Palpations: Abdomen is soft. There is no mass.  ?   Tenderness: There is no abdominal tenderness. There is no guarding or rebound.  ?Musculoskeletal:     ?   General: Tenderness present.  ?   Cervical back: Normal range of motion and neck supple. No rigidity.  ?Lymphadenopathy:  ?   Cervical: No cervical adenopathy.  ?Skin: ?   Findings: No erythema or rash.  ?Neurological:  ?   Mental Status: She is oriented to person, place, and time.  ?   Cranial Nerves: No cranial nerve deficit.  ?   Motor: No abnormal muscle tone.  ?   Coordination: Coordination abnormal.  ?   Gait: Gait abnormal.  ?   Deep Tendon Reflexes: Reflexes normal.  ?Psychiatric:     ?   Behavior: Behavior normal.     ?   Thought Content: Thought content normal.     ?   Judgment: Judgment normal.  ?Using a cane ? ?Lab Results  ?Component Value Date  ? WBC 4.2 07/24/2021  ? HGB 8.9 (L) 07/24/2021  ? HCT 26.5 (L) 07/24/2021  ? PLT 273 07/24/2021  ? GLUCOSE 100 (H) 07/24/2021  ? CHOL 158 03/01/2018  ? TRIG 84.0 03/01/2018  ? HDL 47.10 03/01/2018  ? [place] 94 03/01/2018  ? ALT 6 07/24/2021  ? AST 18 07/24/2021  ? NA 130 (L) 07/24/2021  ? K 3.8 07/24/2021  ? CL 98 07/24/2021  ? CREATININE 0.87 07/24/2021  ? BUN 12 07/24/2021  ? CO2 26 07/24/2021  ? TSH 0.66 04/28/2021  ? INR 0.97 12/29/2017  ? HGBA1C 5.4 04/28/2021  ? ? ?US BREAST LTD UNI RIGHT INC AXILLA ? ?Result Date: 07/11/2020 ?CLINICAL DATA:  The patient was called back for a possible mass in the right periareolar region. EXAM: ULTRASOUND OF THE RIGHT BREAST COMPARISON:  Previous exam(s). FINDINGS: On physical exam, no suspicious lumps are identified. Targeted ultrasound is  performed, showing ductal ectasia in the periareolar region inferiorly and medially accounting for the mammographic finding. IMPRESSION: Ductal ectasia.  No evidence of malignancy. RECOMMENDATION: Annual screening mammography. I have discussed the findings and recommendations with the patient. If applicable,  a reminder letter will be sent to the patient regarding the next appointment. BI-RADS CATEGORY  2: Benign. Electronically Signed   By: Dorise Bullion III M.D   On: 07/11/2020 13:25  ? ? ?Assessment & Plan:  ? ?Problem List Items Addressed This Visit   ? ? Essential hypertension  ?  Cont on Diovan HCT  320/25, Clonidine twice daily prn. Continue Coreg 25 mg bid, Norvasc.  ? ?Better Catapress patch ?Catapress tabs prn ?  ?  ? Relevant Orders  ? CBC with Differential/Platelet  ? Comprehensive metabolic panel  ? TSH  ? Asthma  ?  Continue on Symbicort.  Use a rescue inhaler as needed-Ventolin ?  ?  ? Lumbago  ?  Oxycodone w/Pnenergan prn. Gabapentin ?Aspercream prn ? Potential benefits of an opioid and steroid  use as well as potential risks  and complications were explained to the patient and were aknowledged. ? ?Blue-Emu cream was recommended to use 2-3 times a day  ? ?  ?  ? Left sciatic nerve pain  ?  On Gabapentin po ?Cont w/Aspercream ? ?  ?  ? Anemia - Primary  ?  Check CBC ? ?  ?  ? Relevant Orders  ? CBC with Differential/Platelet  ? Comprehensive metabolic panel  ? TSH  ? Stress at home  ?  Better - her dtr moved out... ?D/c Lexapro ?  ?  ?  ? ? ?No orders of the defined types were placed in this encounter. ?  ? ? ?Follow-up: Return in about 3 months (around 01/13/2022) for a follow-up visit. ? ?[name], MD ?

## 2021-10-14 NOTE — Assessment & Plan Note (Signed)
Continue on Symbicort.  Use a rescue inhaler as needed-Ventolin ?

## 2021-10-14 NOTE — Assessment & Plan Note (Signed)
Check CBC 

## 2021-10-14 NOTE — Assessment & Plan Note (Signed)
On Gabapentin po ?Cont w/Aspercream ?

## 2021-11-14 DIAGNOSIS — H25812 Combined forms of age-related cataract, left eye: Secondary | ICD-10-CM | POA: Diagnosis not present

## 2021-11-28 DIAGNOSIS — H25811 Combined forms of age-related cataract, right eye: Secondary | ICD-10-CM | POA: Diagnosis not present

## 2021-12-01 ENCOUNTER — Other Ambulatory Visit: Payer: Self-pay | Admitting: Internal Medicine

## 2021-12-06 ENCOUNTER — Other Ambulatory Visit: Payer: Self-pay | Admitting: Hematology

## 2021-12-06 DIAGNOSIS — D471 Chronic myeloproliferative disease: Secondary | ICD-10-CM

## 2021-12-19 ENCOUNTER — Other Ambulatory Visit: Payer: Self-pay

## 2021-12-19 DIAGNOSIS — D471 Chronic myeloproliferative disease: Secondary | ICD-10-CM

## 2021-12-22 ENCOUNTER — Inpatient Hospital Stay: Payer: Medicare PPO | Attending: Hematology

## 2021-12-22 ENCOUNTER — Other Ambulatory Visit: Payer: Self-pay

## 2021-12-22 ENCOUNTER — Inpatient Hospital Stay: Payer: Medicare PPO | Admitting: Hematology

## 2021-12-22 ENCOUNTER — Encounter: Payer: Self-pay | Admitting: Hematology

## 2021-12-22 VITALS — BP 136/52 | HR 69 | Temp 99.0°F | Resp 18 | Ht 62.0 in | Wt 132.0 lb

## 2021-12-22 DIAGNOSIS — D649 Anemia, unspecified: Secondary | ICD-10-CM | POA: Insufficient documentation

## 2021-12-22 DIAGNOSIS — Z79899 Other long term (current) drug therapy: Secondary | ICD-10-CM | POA: Insufficient documentation

## 2021-12-22 DIAGNOSIS — I10 Essential (primary) hypertension: Secondary | ICD-10-CM | POA: Diagnosis not present

## 2021-12-22 DIAGNOSIS — D471 Chronic myeloproliferative disease: Secondary | ICD-10-CM | POA: Diagnosis not present

## 2021-12-22 DIAGNOSIS — M1990 Unspecified osteoarthritis, unspecified site: Secondary | ICD-10-CM | POA: Insufficient documentation

## 2021-12-22 DIAGNOSIS — M549 Dorsalgia, unspecified: Secondary | ICD-10-CM | POA: Insufficient documentation

## 2021-12-22 DIAGNOSIS — D75839 Thrombocytosis, unspecified: Secondary | ICD-10-CM | POA: Insufficient documentation

## 2021-12-22 LAB — CBC WITH DIFFERENTIAL/PLATELET
Abs Immature Granulocytes: 0.01 10*3/uL (ref 0.00–0.07)
Basophils Absolute: 0 10*3/uL (ref 0.0–0.1)
Basophils Relative: 1 %
Eosinophils Absolute: 0.1 10*3/uL (ref 0.0–0.5)
Eosinophils Relative: 1 %
HCT: 23.7 % — ABNORMAL LOW (ref 36.0–46.0)
Hemoglobin: 8.7 g/dL — ABNORMAL LOW (ref 12.0–15.0)
Immature Granulocytes: 0 %
Lymphocytes Relative: 27 %
Lymphs Abs: 1.4 10*3/uL (ref 0.7–4.0)
MCH: 31.5 pg (ref 26.0–34.0)
MCHC: 36.7 g/dL — ABNORMAL HIGH (ref 30.0–36.0)
MCV: 85.9 fL (ref 80.0–100.0)
Monocytes Absolute: 0.5 10*3/uL (ref 0.1–1.0)
Monocytes Relative: 11 %
Neutro Abs: 3 10*3/uL (ref 1.7–7.7)
Neutrophils Relative %: 60 %
Platelets: 254 10*3/uL (ref 150–400)
RBC: 2.76 MIL/uL — ABNORMAL LOW (ref 3.87–5.11)
RDW: 10.7 % — ABNORMAL LOW (ref 11.5–15.5)
WBC: 4.9 10*3/uL (ref 4.0–10.5)
nRBC: 0 % (ref 0.0–0.2)

## 2021-12-22 LAB — COMPREHENSIVE METABOLIC PANEL
ALT: 6 U/L (ref 0–44)
AST: 18 U/L (ref 15–41)
Albumin: 4 g/dL (ref 3.5–5.0)
Alkaline Phosphatase: 60 U/L (ref 38–126)
Anion gap: 9 (ref 5–15)
BUN: 12 mg/dL (ref 8–23)
CO2: 25 mmol/L (ref 22–32)
Calcium: 9.3 mg/dL (ref 8.9–10.3)
Chloride: 88 mmol/L — ABNORMAL LOW (ref 98–111)
Creatinine, Ser: 0.85 mg/dL (ref 0.44–1.00)
GFR, Estimated: >60 mL/min (ref >60)
Glucose, Bld: 113 mg/dL — ABNORMAL HIGH (ref 70–99)
Potassium: 3.3 mmol/L — ABNORMAL LOW (ref 3.5–5.1)
Sodium: 122 mmol/L — ABNORMAL LOW (ref 135–145)
Total Bilirubin: 0.3 mg/dL (ref 0.3–1.2)
Total Protein: 6.9 g/dL (ref 6.5–8.1)

## 2021-12-29 ENCOUNTER — Other Ambulatory Visit: Payer: Self-pay

## 2021-12-29 NOTE — Progress Notes (Signed)
Epic faxed pt's labs to pt's PCP (Dr. Alain Marion) per Dr. Ernestina Penna request.  Fax confirmation received.

## 2022-01-14 ENCOUNTER — Encounter: Payer: Self-pay | Admitting: Internal Medicine

## 2022-01-14 ENCOUNTER — Ambulatory Visit: Payer: Medicare PPO | Admitting: Internal Medicine

## 2022-01-14 ENCOUNTER — Telehealth: Payer: Self-pay

## 2022-01-14 ENCOUNTER — Encounter (HOSPITAL_COMMUNITY): Payer: Self-pay

## 2022-01-14 ENCOUNTER — Other Ambulatory Visit: Payer: Self-pay

## 2022-01-14 ENCOUNTER — Ambulatory Visit (INDEPENDENT_AMBULATORY_CARE_PROVIDER_SITE_OTHER): Payer: Medicare PPO

## 2022-01-14 ENCOUNTER — Telehealth: Payer: Self-pay | Admitting: Internal Medicine

## 2022-01-14 ENCOUNTER — Inpatient Hospital Stay (HOSPITAL_COMMUNITY)
Admission: EM | Admit: 2022-01-14 | Discharge: 2022-01-19 | DRG: 641 | Disposition: A | Discharge status: Home / Self Care | Payer: Medicare PPO | Attending: Internal Medicine | Admitting: Internal Medicine

## 2022-01-14 DIAGNOSIS — I739 Peripheral vascular disease, unspecified: Secondary | ICD-10-CM | POA: Diagnosis not present

## 2022-01-14 DIAGNOSIS — Z8249 Family history of ischemic heart disease and other diseases of the circulatory system: Secondary | ICD-10-CM

## 2022-01-14 DIAGNOSIS — A084 Viral intestinal infection, unspecified: Secondary | ICD-10-CM | POA: Diagnosis present

## 2022-01-14 DIAGNOSIS — Z8261 Family history of arthritis: Secondary | ICD-10-CM

## 2022-01-14 DIAGNOSIS — Z800 Family history of malignant neoplasm of digestive organs: Secondary | ICD-10-CM | POA: Diagnosis not present

## 2022-01-14 DIAGNOSIS — D469 Myelodysplastic syndrome, unspecified: Secondary | ICD-10-CM | POA: Diagnosis not present

## 2022-01-14 DIAGNOSIS — Z803 Family history of malignant neoplasm of breast: Secondary | ICD-10-CM

## 2022-01-14 DIAGNOSIS — N390 Urinary tract infection, site not specified: Secondary | ICD-10-CM | POA: Diagnosis present

## 2022-01-14 DIAGNOSIS — E440 Moderate protein-calorie malnutrition: Secondary | ICD-10-CM | POA: Diagnosis not present

## 2022-01-14 DIAGNOSIS — Z833 Family history of diabetes mellitus: Secondary | ICD-10-CM

## 2022-01-14 DIAGNOSIS — E861 Hypovolemia: Secondary | ICD-10-CM | POA: Diagnosis present

## 2022-01-14 DIAGNOSIS — D630 Anemia in neoplastic disease: Secondary | ICD-10-CM | POA: Diagnosis present

## 2022-01-14 DIAGNOSIS — E871 Hypo-osmolality and hyponatremia: Principal | ICD-10-CM | POA: Diagnosis present

## 2022-01-14 DIAGNOSIS — J45909 Unspecified asthma, uncomplicated: Secondary | ICD-10-CM | POA: Diagnosis present

## 2022-01-14 DIAGNOSIS — E860 Dehydration: Secondary | ICD-10-CM | POA: Diagnosis not present

## 2022-01-14 DIAGNOSIS — R112 Nausea with vomiting, unspecified: Secondary | ICD-10-CM | POA: Diagnosis present

## 2022-01-14 DIAGNOSIS — D649 Anemia, unspecified: Secondary | ICD-10-CM | POA: Diagnosis present

## 2022-01-14 DIAGNOSIS — D473 Essential (hemorrhagic) thrombocythemia: Secondary | ICD-10-CM | POA: Diagnosis present

## 2022-01-14 DIAGNOSIS — E876 Hypokalemia: Secondary | ICD-10-CM | POA: Diagnosis present

## 2022-01-14 DIAGNOSIS — Z6822 Body mass index (BMI) 22.0-22.9, adult: Secondary | ICD-10-CM

## 2022-01-14 DIAGNOSIS — I10 Essential (primary) hypertension: Secondary | ICD-10-CM | POA: Diagnosis present

## 2022-01-14 DIAGNOSIS — Z885 Allergy status to narcotic agent status: Secondary | ICD-10-CM

## 2022-01-14 DIAGNOSIS — R8271 Bacteriuria: Secondary | ICD-10-CM | POA: Diagnosis present

## 2022-01-14 DIAGNOSIS — B961 Klebsiella pneumoniae [K. pneumoniae] as the cause of diseases classified elsewhere: Secondary | ICD-10-CM | POA: Diagnosis not present

## 2022-01-14 DIAGNOSIS — D471 Chronic myeloproliferative disease: Secondary | ICD-10-CM | POA: Diagnosis not present

## 2022-01-14 DIAGNOSIS — Z86010 Personal history of colonic polyps: Secondary | ICD-10-CM | POA: Diagnosis not present

## 2022-01-14 DIAGNOSIS — B9620 Unspecified Escherichia coli [E. coli] as the cause of diseases classified elsewhere: Secondary | ICD-10-CM | POA: Diagnosis present

## 2022-01-14 DIAGNOSIS — Z801 Family history of malignant neoplasm of trachea, bronchus and lung: Secondary | ICD-10-CM

## 2022-01-14 DIAGNOSIS — R197 Diarrhea, unspecified: Secondary | ICD-10-CM

## 2022-01-14 DIAGNOSIS — Z79899 Other long term (current) drug therapy: Secondary | ICD-10-CM

## 2022-01-14 DIAGNOSIS — Z856 Personal history of leukemia: Secondary | ICD-10-CM

## 2022-01-14 DIAGNOSIS — N179 Acute kidney failure, unspecified: Secondary | ICD-10-CM | POA: Diagnosis present

## 2022-01-14 DIAGNOSIS — Z888 Allergy status to other drugs, medicaments and biological substances status: Secondary | ICD-10-CM

## 2022-01-14 DIAGNOSIS — R410 Disorientation, unspecified: Secondary | ICD-10-CM | POA: Diagnosis not present

## 2022-01-14 LAB — COMPREHENSIVE METABOLIC PANEL
ALT: 7 U/L (ref 0–35)
ALT: 11 U/L (ref 0–44)
AST: 18 U/L (ref 0–37)
AST: 22 U/L (ref 15–41)
Albumin: 4.7 g/dL (ref 3.5–5.2)
Albumin: 4.1 g/dL (ref 3.5–5.0)
Alkaline Phosphatase: 54 U/L (ref 39–117)
Alkaline Phosphatase: 46 U/L (ref 38–126)
Anion gap: 14 (ref 5–15)
BUN: 18 mg/dL (ref 6–23)
BUN: 19 mg/dL (ref 8–23)
CO2: 27 mEq/L (ref 19–32)
CO2: 22 mmol/L (ref 22–32)
Calcium: 9.6 mg/dL (ref 8.4–10.5)
Calcium: 9.3 mg/dL (ref 8.9–10.3)
Chloride: 82 mEq/L — ABNORMAL LOW (ref 96–112)
Chloride: 83 mmol/L — ABNORMAL LOW (ref 98–111)
Creatinine, Ser: 1.22 mg/dL — ABNORMAL HIGH (ref 0.40–1.20)
Creatinine, Ser: 1.26 mg/dL — ABNORMAL HIGH (ref 0.44–1.00)
GFR, Estimated: 44 mL/min — ABNORMAL LOW (ref >60)
GFR: 43.18 mL/min — ABNORMAL LOW (ref >60.00)
Glucose, Bld: 113 mg/dL — ABNORMAL HIGH (ref 70–99)
Glucose, Bld: 121 mg/dL — ABNORMAL HIGH (ref 70–99)
Potassium: 3.4 mEq/L — ABNORMAL LOW (ref 3.5–5.1)
Potassium: 3.2 mmol/L — ABNORMAL LOW (ref 3.5–5.1)
Sodium: 117 mEq/L — CL (ref 135–145)
Sodium: 119 mmol/L — CL (ref 135–145)
Total Bilirubin: 0.5 mg/dL (ref 0.2–1.2)
Total Bilirubin: 0.4 mg/dL (ref 0.3–1.2)
Total Protein: 7.7 g/dL (ref 6.0–8.3)
Total Protein: 7.4 g/dL (ref 6.5–8.1)

## 2022-01-14 LAB — URINALYSIS, ROUTINE W REFLEX MICROSCOPIC
Bilirubin Urine: NEGATIVE
Glucose, UA: NEGATIVE mg/dL
Ketones, ur: NEGATIVE mg/dL
Nitrite: NEGATIVE
Protein, ur: 30 mg/dL — AB
Specific Gravity, Urine: 1.011 (ref 1.005–1.030)
WBC, UA: >50 WBC/hpf — ABNORMAL HIGH (ref 0–5)
pH: 5 (ref 5.0–8.0)

## 2022-01-14 LAB — CBC
HCT: 26.4 % — ABNORMAL LOW (ref 36.0–46.0)
Hemoglobin: 9.8 g/dL — ABNORMAL LOW (ref 12.0–15.0)
MCH: 31.5 pg (ref 26.0–34.0)
MCHC: 37.1 g/dL — ABNORMAL HIGH (ref 30.0–36.0)
MCV: 84.9 fL (ref 80.0–100.0)
Platelets: 392 10*3/uL (ref 150–400)
RBC: 3.11 MIL/uL — ABNORMAL LOW (ref 3.87–5.11)
RDW: 11 % — ABNORMAL LOW (ref 11.5–15.5)
WBC: 5.8 10*3/uL (ref 4.0–10.5)
nRBC: 0 % (ref 0.0–0.2)

## 2022-01-14 LAB — CBC WITH DIFFERENTIAL/PLATELET
Basophils Absolute: 0.1 10*3/uL (ref 0.0–0.1)
Basophils Relative: 1.5 % (ref 0.0–3.0)
Eosinophils Absolute: 0 10*3/uL (ref 0.0–0.7)
Eosinophils Relative: 0.7 % (ref 0.0–5.0)
HCT: 29.2 % — ABNORMAL LOW (ref 36.0–46.0)
Hemoglobin: 10.3 g/dL — ABNORMAL LOW (ref 12.0–15.0)
Lymphocytes Relative: 28.3 % (ref 12.0–46.0)
Lymphs Abs: 1.5 10*3/uL (ref 0.7–4.0)
MCHC: 35.5 g/dL (ref 30.0–36.0)
MCV: 88.3 fl (ref 78.0–100.0)
Monocytes Absolute: 0.5 10*3/uL (ref 0.1–1.0)
Monocytes Relative: 10.4 % (ref 3.0–12.0)
Neutro Abs: 3.1 10*3/uL (ref 1.4–7.7)
Neutrophils Relative %: 59.1 % (ref 43.0–77.0)
Platelets: 448 10*3/uL — ABNORMAL HIGH (ref 150.0–400.0)
RBC: 3.3 Mil/uL — ABNORMAL LOW (ref 3.87–5.11)
RDW: 11.9 % (ref 11.5–15.5)
WBC: 5.3 10*3/uL (ref 4.0–10.5)

## 2022-01-14 LAB — LIPASE, BLOOD: Lipase: 58 U/L — ABNORMAL HIGH (ref 11–51)

## 2022-01-14 LAB — SODIUM, URINE, RANDOM: Sodium, Ur: 31 mmol/L

## 2022-01-14 LAB — OSMOLALITY, URINE: Osmolality, Ur: 312 mOsm/kg (ref 300–900)

## 2022-01-14 LAB — LIPASE: Lipase: 68 U/L — ABNORMAL HIGH (ref 11.0–59.0)

## 2022-01-14 MED ORDER — CLONIDINE HCL 0.1 MG/24HR TD PTWK
0.1000 mg | MEDICATED_PATCH | TRANSDERMAL | Status: DC
Start: 2022-01-14 — End: 2022-01-19
  Administered 2022-01-15: 0.1 mg via TRANSDERMAL
  Filled 2022-01-14: qty 1

## 2022-01-14 MED ORDER — ONDANSETRON HCL 4 MG/2ML IJ SOLN
4.0000 mg | Freq: Once | INTRAMUSCULAR | Status: AC
  Administered 2022-01-14: 4 mg via INTRAMUSCULAR

## 2022-01-14 MED ORDER — ONDANSETRON HCL 4 MG/2ML IJ SOLN: 4.0000 mg | Freq: Four times a day (QID) | INTRAMUSCULAR | Status: DC | PRN

## 2022-01-14 MED ORDER — ENOXAPARIN SODIUM 30 MG/0.3ML IJ SOSY
30.0000 mg | PREFILLED_SYRINGE | INTRAMUSCULAR | Status: DC
Start: 2022-01-14 — End: 2022-01-16
  Administered 2022-01-15 (×2): 30 mg via SUBCUTANEOUS
  Filled 2022-01-14 (×2): qty 0.3

## 2022-01-14 MED ORDER — ONDANSETRON HCL 4 MG PO TABS: 4.0000 mg | ORAL_TABLET | Freq: Four times a day (QID) | ORAL | Status: DC | PRN

## 2022-01-14 MED ORDER — POTASSIUM CHLORIDE CRYS ER 20 MEQ PO TBCR
40.0000 meq | EXTENDED_RELEASE_TABLET | Freq: Once | ORAL | Status: AC
  Administered 2022-01-14: 40 meq via ORAL
  Filled 2022-01-14: qty 2

## 2022-01-14 MED ORDER — ALBUTEROL SULFATE HFA 108 (90 BASE) MCG/ACT IN AERS
2.0000 | INHALATION_SPRAY | RESPIRATORY_TRACT | Status: DC | PRN
Start: 2022-01-14 — End: 2022-01-14

## 2022-01-14 MED ORDER — ACETAMINOPHEN 650 MG RE SUPP: 650.0000 mg | Freq: Four times a day (QID) | RECTAL | Status: DC | PRN

## 2022-01-14 MED ORDER — ACETAMINOPHEN 325 MG PO TABS: 650.0000 mg | ORAL_TABLET | Freq: Four times a day (QID) | ORAL | Status: DC | PRN

## 2022-01-14 MED ORDER — DIPHENOXYLATE-ATROPINE 2.5-0.025 MG PO TABS: 1.0000 | ORAL_TABLET | Freq: Four times a day (QID) | ORAL | 0 refills | Status: DC | PRN

## 2022-01-14 MED ORDER — MOMETASONE FURO-FORMOTEROL FUM 200-5 MCG/ACT IN AERO
2.0000 | INHALATION_SPRAY | Freq: Two times a day (BID) | RESPIRATORY_TRACT | Status: DC
  Administered 2022-01-15 – 2022-01-19 (×7): 2 via RESPIRATORY_TRACT
  Filled 2022-01-14: qty 8.8

## 2022-01-14 MED ORDER — SODIUM CHLORIDE 0.9 % IV SOLN
1.0000 g | Freq: Once | INTRAVENOUS | Status: AC
  Administered 2022-01-14: 1 g via INTRAVENOUS
  Filled 2022-01-14: qty 10

## 2022-01-14 MED ORDER — CARVEDILOL 25 MG PO TABS
25.0000 mg | ORAL_TABLET | Freq: Two times a day (BID) | ORAL | Status: DC
  Administered 2022-01-15 – 2022-01-19 (×10): 25 mg via ORAL
  Filled 2022-01-14: qty 1
  Filled 2022-01-14: qty 2
  Filled 2022-01-14 (×5): qty 1
  Filled 2022-01-14: qty 2
  Filled 2022-01-14 (×2): qty 1

## 2022-01-14 MED ORDER — ALBUTEROL SULFATE (2.5 MG/3ML) 0.083% IN NEBU: 2.5000 mg | INHALATION_SOLUTION | RESPIRATORY_TRACT | Status: DC | PRN

## 2022-01-14 MED ORDER — SODIUM CHLORIDE 0.9 % IV BOLUS
1000.0000 mL | Freq: Once | INTRAVENOUS | Status: AC
Start: 2022-01-14 — End: 2022-01-14
  Administered 2022-01-14: 1000 mL via INTRAVENOUS

## 2022-01-14 MED ORDER — ONDANSETRON HCL 4 MG PO TABS: 4.0000 mg | ORAL_TABLET | Freq: Three times a day (TID) | ORAL | 0 refills | Status: DC | PRN

## 2022-01-14 MED ORDER — POTASSIUM CHLORIDE IN NACL 20-0.9 MEQ/L-% IV SOLN
INTRAVENOUS | Status: AC
Start: 2022-01-14 — End: 2022-01-15
  Filled 2022-01-14: qty 1000

## 2022-01-14 MED ORDER — CEFTRIAXONE SODIUM 1 G IJ SOLR
1.0000 g | Freq: Once | INTRAMUSCULAR | Status: AC
  Administered 2022-01-14: 1 g via INTRAMUSCULAR

## 2022-01-14 MED ORDER — CIPROFLOXACIN HCL 500 MG PO TABS: 500.0000 mg | ORAL_TABLET | Freq: Two times a day (BID) | ORAL | 0 refills | Status: DC

## 2022-01-14 NOTE — Telephone Encounter (Signed)
Spoke with pts daughter Dewaine Oats and she has stated she will take pt to ER per doctors orders.

## 2022-01-14 NOTE — Assessment & Plan Note (Addendum)
New x2 wks PO hydration Pt refused ER transfer Labs today Empiric IM and po abx Zofran IM and po CXR Blood cx

## 2022-01-14 NOTE — Assessment & Plan Note (Addendum)
Resolved. Likely due to viral gastroenteritis.

## 2022-01-14 NOTE — Telephone Encounter (Signed)
CRITICAL VALUE STICKER  CRITICAL VALUE: sodium 116  RECEIVER (on-site recipient of call): Jarrett Soho  DATE & TIME NOTIFIED: 01/14/2022 at 4:58 pm  MESSENGER (representative from lab): Saa  MD NOTIFIED: Dr. Camila Li  TIME OF NOTIFICATION: 5:00 pm

## 2022-01-14 NOTE — Assessment & Plan Note (Signed)
Acute. Resolved Labs, CXR ordered

## 2022-01-14 NOTE — Assessment & Plan Note (Signed)
New x2 wks Pt refused ER transfer Labs today Empiric IM and po abx Zofran IM and po CXR

## 2022-01-14 NOTE — Telephone Encounter (Signed)
PCP informed to call daughter to have pt sent to ER

## 2022-01-14 NOTE — Assessment & Plan Note (Addendum)
Secondary to GI losses.  Supplemented. Resolved. Monitor. Magnesium 1.7. Will replace.

## 2022-01-14 NOTE — Assessment & Plan Note (Addendum)
The patient is normotensive on carvedilol and clonidine. Continue to monitor.

## 2022-01-14 NOTE — ED Provider Notes (Signed)
[facility] DEPT Provider Note   CSN: [id number] Arrival date & time: 01/14/22  1736     History  Chief Complaint  Patient presents with   Abnormal Lab   Nausea   Emesis   Diarrhea    Paula Griffith is a 76 y.o. female.  Patient presents to the ER chief complaint of low sodium.  This was seen by the primary care doctor today.  Patient states that she been having some vomiting diarrhea generalized weakness ongoing for the past week.  Nonbloody nonbilious stools.  Her doctor has been trending labs, her sodium has been trending downwards over the course of the last several weeks.  Patient complaining of generalized weakness and dizziness as well.  Denies headache or chest pain or abdominal pain no reports of fevers.       Home Medications Prior to Admission medications   Medication Sig Start Date End Date Taking? Authorizing Provider  albuterol (PROAIR HFA) 108 (90 Base) MCG/ACT inhaler Inhale 2 puffs into the lungs every 4 (four) hours as needed. For shortness of breath 08/07/19   [name], [name], MD  amLODipine (NORVASC) 5 MG tablet TAKE 1 TABLET BY MOUTH EVERY DAY 08/18/21   [name], [name], MD  anagrelide (AGRYLIN) 1 MG capsule TAKE 1 CAPSULE BY MOUTH DAILY EXCEPT 2 DAYS A WEEK (TUESDAYS AND THURSDAYS) DO NOT TAKE. 12/07/21   Alla Feeling, NP  Ascorbic Acid (VITAMIN C) 1000 MG tablet Take 1,000 mg by mouth daily.    [provider]  aspirin 325 MG EC tablet Take 325 mg by mouth daily after breakfast.    [provider]  carvedilol (COREG) 25 MG tablet Take 1 tablet (25 mg total) by mouth 2 (two) times daily. 09/29/21 09/29/22  [name], [name], MD  Cholecalciferol (VITAMIN D3) 2000 units TABS Take 2,000 Units by mouth daily.    [provider]  ciprofloxacin (CIPRO) 500 MG tablet Take 1 tablet (500 mg total) by mouth 2 (two) times daily for 10 days. 01/15/22 01/25/22  [name], [name], MD  cloNIDine (CATAPRES -  DOSED IN MG/24 HR) 0.1 mg/24hr patch Place 1 patch (0.1 mg total) onto the skin once a week. 07/30/21   [name], [name], MD  cloNIDine (CATAPRES) 0.1 MG tablet TAKE 1 TABLET (0.1 MG TOTAL) BY MOUTH 2 (TWO) TIMES DAILY AS NEEDED. TAKE IF BP> 160/110 12/01/21   [name], [name], MD  diphenoxylate-atropine (LOMOTIL) 2.5-0.025 MG tablet Take 1 tablet by mouth 4 (four) times daily as needed for diarrhea or loose stools. 01/14/22   [name], [name], MD  Fluticasone-Salmeterol (ADVAIR) 250-50 MCG/DOSE AEPB Please specify directions, refills and quantity 08/07/19   [name], [name], MD  gabapentin (NEURONTIN) 100 MG capsule TAKE 1 CAPSULE BY MOUTH THREE TIMES A DAY 07/13/21   [name], [name], MD  Multiple Vitamins-Minerals (ADULT GUMMY PO) Take 2 tablets by mouth daily.    [provider]  ondansetron (ZOFRAN) 4 MG tablet Take 1 tablet (4 mg total) by mouth every 8 (eight) hours as needed for nausea or vomiting. 01/14/22   [name], [name], MD  trolamine salicylate (ASPERCREME) 10 % cream Apply 1 application topically 2 (two) times daily as needed for muscle pain.    [provider]  valsartan-hydrochlorothiazide (DIOVAN-HCT) 320-25 MG tablet TAKE 1 TABLET BY MOUTH EVERY DAY 06/17/21   [name], [name], MD  losartan-hydrochlorothiazide (HYZAAR) 100-25 MG tablet Take 1 tablet by mouth daily. 04/03/19 06/28/19  [name],  [name], MD      Allergies    Benazepril, Amlodipine, Fluad [influenza vac a&b surf ant adj], Other, Codeine, and Tramadol    Review of Systems   Review of Systems  Constitutional:  Negative for fever.  HENT:  Negative for ear pain.   Eyes:  Negative for pain.  Respiratory:  Negative for cough.   Cardiovascular:  Negative for chest pain.  Gastrointestinal:  Negative for abdominal pain.  Genitourinary:  Negative for flank pain.  Musculoskeletal:  Negative for back pain.  Skin:  Negative for rash.  Neurological:  Negative for headaches.     Physical Exam Updated Vital Signs BP (!) 158/86   Pulse 62   Temp (!) 97.4 F (36.3 C) (Oral)   Resp 16   Ht '5\' 2"'$  (1.575 m)   Wt 54.4 kg   SpO2 99%   BMI 21.95 kg/m  Physical Exam Constitutional:      General: She is not in acute distress.    Appearance: Normal appearance.  HENT:     Head: Normocephalic.     Nose: Nose normal.  Eyes:     Extraocular Movements: Extraocular movements intact.  Cardiovascular:     Rate and Rhythm: Normal rate.  Pulmonary:     Effort: Pulmonary effort is normal.  Abdominal:     General: There is no distension.     Tenderness: There is no abdominal tenderness. There is no guarding.  Musculoskeletal:        General: Normal range of motion.     Cervical back: Normal range of motion.  Neurological:     General: No focal deficit present.     Mental Status: She is alert. Mental status is at baseline.     ED Results / Procedures / Treatments   Labs (all labs ordered are listed, but only abnormal results are displayed) Labs Reviewed  LIPASE, BLOOD - Abnormal; Notable for the following components:      Result Value   Lipase 58 (*)    All other components within normal limits  COMPREHENSIVE METABOLIC PANEL - Abnormal; Notable for the following components:   Sodium 119 (*)    Potassium 3.2 (*)    Chloride 83 (*)    Glucose, Bld 121 (*)    Creatinine, Ser 1.26 (*)    GFR, Estimated 44 (*)    All other components within normal limits  CBC - Abnormal; Notable for the following components:   RBC 3.11 (*)    Hemoglobin 9.8 (*)    HCT 26.4 (*)    MCHC 37.1 (*)    RDW 11.0 (*)    All other components within normal limits  URINALYSIS, ROUTINE W REFLEX MICROSCOPIC - Abnormal; Notable for the following components:   APPearance CLOUDY (*)    Hgb urine dipstick SMALL (*)    Protein, ur 30 (*)    Leukocytes,Ua LARGE (*)    WBC, UA >50 (*)    Bacteria, UA MANY (*)    All other components within normal limits    EKG EKG  Interpretation  Date/Time:  Wednesday January 14 2022 19:42:34 EDT Ventricular Rate:  55 PR Interval:  223 QRS Duration: 102 QT Interval:  447 QTC Calculation: 428 R Axis:   60 Text Interpretation: Sinus rhythm Prolonged PR interval Abnormal R-wave progression, early transition Confirmed by Nanda Quinton [phone]) on 01/14/2022 7:45:55 PM  Radiology No results found.  Procedures Procedures    Medications Ordered in ED Medications  cefTRIAXone (ROCEPHIN)  1 g in sodium chloride 0.9 % 100 mL IVPB (has no administration in time range)  sodium chloride 0.9 % bolus 1,000 mL (1,000 mLs Intravenous New Bag/Given 01/14/22 1949)  potassium chloride SA (KLOR-CON M) CR tablet 40 mEq (40 mEq Oral Given 01/14/22 2015)    ED Course/ Medical Decision Making/ A&P                           Medical Decision Making Amount and/or Complexity of Data Reviewed Labs: ordered.  Risk Prescription drug management.   Review of record shows office visit with primary care doctor today.  Cardiac monitoring showing sinus rhythm.  History from family at bedside.  Dialysis studies includes labs here white count 5 hemoglobin 10 chemistry shows sodium of 119 and creatinine 1.2 which appears increased from her baseline.  Urinalysis concerning for UTI as well.  Patient given a liter bolus of fluids and IV Rocephin.  Consultation with hospitalist for admission.        Final Clinical Impression(s) / ED Diagnoses Final diagnoses:  Hyponatremia  Dehydration  AKI (acute kidney injury) [facility])    Rx / DC Orders ED Discharge Orders     None         Luna Fuse, MD 01/14/22 2053

## 2022-01-14 NOTE — Assessment & Plan Note (Addendum)
Stable without wheezing on admission.  Continue Symbicort and albuterol as needed. Resolved.

## 2022-01-14 NOTE — Telephone Encounter (Signed)
Lab called earlier w/Sodium of 116. I spoke w/pt and dtr. They are at [facility] ER now

## 2022-01-14 NOTE — ED Provider Triage Note (Signed)
Emergency Medicine Provider Triage Evaluation Note  Paula Griffith , a 76 y.o. female  was evaluated in triage.  Pt complains of abnormal labs. Pt haven't had much of an appetite for 2 weeks.  Occasional loose stool.  Seen by PCP recently and was told to come to the ER due to low sodium. Denies pain, confusion, n/v, abd pain, dysuria or medication changes.  Denies depression  Review of Systems  Positive: As above Negative: As above  Physical Exam  BP 134/73 (BP Location: Left Arm)   Pulse 69   Temp (!) 97.4 F (36.3 C) (Oral)   Resp 17   Ht '5\' 2"'$  (1.575 m)   Wt 54.4 kg   SpO2 100%   BMI 21.95 kg/m  Gen:   Awake, no distress   Resp:  Normal effort  MSK:   Moves extremities without difficulty  Other:    Medical Decision Making  Medically screening exam initiated at 6:00 PM.  Appropriate orders placed.  DONETA BAYMAN was informed that the remainder of the evaluation will be completed by another provider, this initial triage assessment does not replace that evaluation, and the importance of remaining in the ED until their evaluation is complete.     Domenic Moras, PA-C 01/14/22 1801

## 2022-01-14 NOTE — Assessment & Plan Note (Addendum)
Mild with creatinine 1.26 on admission compared to baseline 0.8-0.9.  Likely prerenal from hypovolemia due to GI losses. -Creatinine is improved to 0.73. Monitor.

## 2022-01-14 NOTE — Assessment & Plan Note (Addendum)
Following with hematology/oncology Dr. Burr Medico.  On current therapy with anagrelide which we will hold for now due to potential GI side effect. Continue anagrelide.

## 2022-01-14 NOTE — Hospital Course (Signed)
Paula Griffith is a 76 y.o. female with medical history significant for myeloproliferative neoplasm with essential thrombocythemia and anemia, hypertension, asthma, chronic hyponatremia who is admitted with acute on chronic hyponatremia.

## 2022-01-14 NOTE — Progress Notes (Signed)
Subjective:  Patient ID: Paula Griffith, female    DOB: December 22, 1945  Age: 76 y.o. MRN: [id number]  CC: No chief complaint on file.   HPI Paula Griffith presents for n/v/d with weakness, confusion since x 2 weeks. She was incontinent to stool once. C/o falling. She is here w/her daughter  Outpatient Medications Prior to Visit  Medication Sig Dispense Refill   albuterol (PROAIR HFA) 108 (90 Base) MCG/ACT inhaler Inhale 2 puffs into the lungs every 4 (four) hours as needed. For shortness of breath 18 g 5   amLODipine (NORVASC) 5 MG tablet TAKE 1 TABLET BY MOUTH EVERY DAY 90 tablet 3   anagrelide (AGRYLIN) 1 MG capsule TAKE 1 CAPSULE BY MOUTH DAILY EXCEPT 2 DAYS A WEEK (TUESDAYS AND THURSDAYS) DO NOT TAKE. 66 capsule 1   Ascorbic Acid (VITAMIN C) 1000 MG tablet Take 1,000 mg by mouth daily.     aspirin 325 MG EC tablet Take 325 mg by mouth daily after breakfast.     carvedilol (COREG) 25 MG tablet Take 1 tablet (25 mg total) by mouth 2 (two) times daily. 60 tablet 11   Cholecalciferol (VITAMIN D3) 2000 units TABS Take 2,000 Units by mouth daily.     cloNIDine (CATAPRES - DOSED IN MG/24 HR) 0.1 mg/24hr patch Place 1 patch (0.1 mg total) onto the skin once a week. 4 patch 12   cloNIDine (CATAPRES) 0.1 MG tablet TAKE 1 TABLET (0.1 MG TOTAL) BY MOUTH 2 (TWO) TIMES DAILY AS NEEDED. TAKE IF BP> 160/110 180 tablet 1   Fluticasone-Salmeterol (ADVAIR) 250-50 MCG/DOSE AEPB Please specify directions, refills and quantity 1 each 11   gabapentin (NEURONTIN) 100 MG capsule TAKE 1 CAPSULE BY MOUTH THREE TIMES A DAY 270 capsule 1   Multiple Vitamins-Minerals (ADULT GUMMY PO) Take 2 tablets by mouth daily.     trolamine salicylate (ASPERCREME) 10 % cream Apply 1 application topically 2 (two) times daily as needed for muscle pain.     valsartan-hydrochlorothiazide (DIOVAN-HCT) 320-25 MG tablet TAKE 1 TABLET BY MOUTH EVERY DAY 90 tablet 3   No facility-administered medications prior to visit.    ROS: Review  of Systems  Constitutional:  Positive for fatigue. Negative for activity change, appetite change, chills, fever and unexpected weight change.  HENT:  Negative for congestion, mouth sores and sinus pressure.   Eyes:  Negative for visual disturbance.  Respiratory:  Negative for cough, chest tightness, shortness of breath and wheezing.   Cardiovascular:  Negative for chest pain and leg swelling.  Gastrointestinal:  Positive for diarrhea, nausea and vomiting. Negative for abdominal pain and blood in stool.  Genitourinary:  Positive for decreased urine volume and frequency. Negative for difficulty urinating and vaginal pain.  Musculoskeletal:  Positive for arthralgias and gait problem. Negative for back pain.  Skin:  Negative for pallor and rash.  Neurological:  Positive for dizziness and weakness. Negative for tremors, numbness and headaches.  Hematological:  Negative for adenopathy.  Psychiatric/Behavioral:  Negative for confusion and sleep disturbance.     Objective:  BP (!) 120/50 (BP Location: Right Arm, Patient Position: Sitting, Cuff Size: Normal)   Pulse 68   Temp 99 F (37.2 C) (Oral)   Ht '5\' 2"'$  (1.575 m)   Wt 120 lb (54.4 kg)   SpO2 99%   BMI 21.95 kg/m   BP Readings from Last 3 Encounters:  01/14/22 (!) 120/50  12/22/21 (!) 136/52  10/14/21 (!) 130/58    Wt Readings from Last 3  Encounters:  01/14/22 120 lb (54.4 kg)  12/22/21 132 lb (59.9 kg)  10/14/21 128 lb (58.1 kg)    Physical Exam Constitutional:      General: She is not in acute distress.    Appearance: She is well-developed.  HENT:     Head: Normocephalic.     Right Ear: External ear normal.     Left Ear: External ear normal.     Nose: Nose normal.  Eyes:     General:        Right eye: No discharge.        Left eye: No discharge.     Conjunctiva/sclera: Conjunctivae normal.     Pupils: Pupils are equal, round, and reactive to light.  Neck:     Thyroid: No thyromegaly.     Vascular: No JVD.      Trachea: No tracheal deviation.  Cardiovascular:     Rate and Rhythm: Normal rate and regular rhythm.     Heart sounds: Normal heart sounds.  Pulmonary:     Effort: No respiratory distress.     Breath sounds: No stridor. No wheezing.  Abdominal:     General: Bowel sounds are normal. There is no distension.     Palpations: Abdomen is soft. There is no mass.     Tenderness: There is no abdominal tenderness. There is no guarding or rebound.  Musculoskeletal:        General: No tenderness.     Cervical back: Normal range of motion and neck supple. No rigidity.  Lymphadenopathy:     Cervical: No cervical adenopathy.  Skin:    Findings: No erythema or rash.  Neurological:     Mental Status: She is oriented to person, place, and time.     Cranial Nerves: No cranial nerve deficit.     Motor: Weakness present. No abnormal muscle tone.     Coordination: Coordination abnormal.     Gait: Gait abnormal.     Deep Tendon Reflexes: Reflexes normal.  Psychiatric:        Behavior: Behavior normal.        Thought Content: Thought content normal.        Judgment: Judgment normal.   Pale, weak Using a cane Thin No meningeal signs   Lab Results  Component Value Date   WBC 4.9 12/22/2021   HGB 8.7 (L) 12/22/2021   HCT 23.7 (L) 12/22/2021   PLT 254 12/22/2021   GLUCOSE 113 (H) 12/22/2021   CHOL 158 03/01/2018   TRIG 84.0 03/01/2018   HDL 47.10 03/01/2018   LDLCALC 94 03/01/2018   ALT 6 12/22/2021   AST 18 12/22/2021   NA 122 (L) 12/22/2021   K 3.3 (L) 12/22/2021   CL 88 (L) 12/22/2021   CREATININE 0.85 12/22/2021   BUN 12 12/22/2021   CO2 25 12/22/2021   TSH 0.93 10/14/2021   INR 0.97 12/29/2017   HGBA1C 5.4 04/28/2021    US BREAST LTD UNI RIGHT INC AXILLA  Result Date: 07/11/2020 CLINICAL DATA:  The patient was called back for a possible mass in the right periareolar region. EXAM: ULTRASOUND OF THE RIGHT BREAST COMPARISON:  Previous exam(s). FINDINGS: On physical exam, no  suspicious lumps are identified. Targeted ultrasound is performed, showing ductal ectasia in the periareolar region inferiorly and medially accounting for the mammographic finding. IMPRESSION: Ductal ectasia.  No evidence of malignancy. RECOMMENDATION: Annual screening mammography. I have discussed the findings and recommendations with the patient. If applicable, a reminder letter  will be sent to the patient regarding the next appointment. BI-RADS CATEGORY  2: Benign. Electronically Signed   By: Dorise Bullion III M.D   On: 07/11/2020 13:25    Assessment & Plan:   Problem List Items Addressed This Visit     Anemia    May be worse CBC      Relevant Orders   Comprehensive metabolic panel   CBC with Differential/Platelet   Urinalysis   Lipase   Confusion    Acute. Resolved Labs, CXR ordered      Dehydration    New x2 wks PO hydration Pt refused ER transfer Labs today Empiric IM and po abx Zofran IM and po CXR Blood cx      Relevant Orders   Comprehensive metabolic panel   CBC with Differential/Platelet   Urinalysis   Lipase   Culture, blood (single) w Reflex to ID Panel   MPN (myeloproliferative neoplasm) (HCC)    Check CBC Blood cx      Nausea vomiting and diarrhea    New x2 wks Pt refused ER transfer Labs today Empiric IM and po abx Zofran IM and po CXR      Relevant Orders   Comprehensive metabolic panel   CBC with Differential/Platelet   Urinalysis   Lipase   DG Chest 2 View   Culture, blood (single) w Reflex to ID Panel      Meds ordered this encounter  Medications   diphenoxylate-atropine (LOMOTIL) 2.5-0.025 MG tablet    Sig: Take 1 tablet by mouth 4 (four) times daily as needed for diarrhea or loose stools.    Dispense:  60 tablet    Refill:  0   ondansetron (ZOFRAN) 4 MG tablet    Sig: Take 1 tablet (4 mg total) by mouth every 8 (eight) hours as needed for nausea or vomiting.    Dispense:  20 tablet    Refill:  0   ciprofloxacin (CIPRO)  500 MG tablet    Sig: Take 1 tablet (500 mg total) by mouth 2 (two) times daily for 10 days.    Dispense:  20 tablet    Refill:  0   cefTRIAXone (ROCEPHIN) injection 1 g   ondansetron (ZOFRAN) injection 4 mg      Follow-up: No follow-ups on file.  [name], MD

## 2022-01-14 NOTE — Assessment & Plan Note (Addendum)
Check CBC Blood cx

## 2022-01-14 NOTE — Assessment & Plan Note (Signed)
May be worse CBC

## 2022-01-14 NOTE — Assessment & Plan Note (Addendum)
Patient has a history of chronic hyponatremia. She presented to ED with a sodium of 119. This improved to 125 by the following morning. On 01/16/2022 the patient's sodium was 127. It then dropped to 125 again on 01/17/2022. She was started on urea. This morning it is back up to 127. Continue to monitor.

## 2022-01-14 NOTE — ED Triage Notes (Signed)
Pt states she was sent to the ED due to low sodium levels. Pt was seen by her pcp today and had labs drawn. She reports nausea, vomiting, diarrhea, decreased appetite, and generalized weakness for about 2 weeks.

## 2022-01-14 NOTE — Assessment & Plan Note (Addendum)
Urinalysis concerning for UTI however patient denies any urinary symptoms. -S/p IV ceftriaxone in ED, hold further antibiotics for now -Urine culture has grown out GNR. Patient has been started on rocephin. Monitor for identification and sensitivities.

## 2022-01-14 NOTE — H&P (Signed)
History and Physical    Paula Griffith:[id number] DOB: [birth year] DOA: 01/14/2022  PCP: Cassandria Anger, MD  Patient coming from: Home  I have personally briefly reviewed patient's old medical records in [place]  Chief Complaint: Hyponatremia  HPI: Paula Griffith is a 76 y.o. female with medical history significant for myeloproliferative neoplasm with essential thrombocythemia and anemia, hypertension, asthma, chronic hyponatremia who presented to the ED for evaluation of worsening hyponatremia.  Patient reported almost 2 weeks of nausea, vomiting, diarrhea.  She has not had any associated abdominal pain.  She has been feeling lethargic otherwise states that her GI symptoms have been improving and wants to go home.  She was seen by her PCP earlier today (7/19) at which time's labs were obtained.  She was found to have worsening hyponatremia with sodium 116 and she was called to come to the ED for further evaluation.  She denies any chest pain or dyspnea.  She denies any dysuria.  She denies any peripheral edema.  She has not been able to keep some of her medications down due to to her nausea and vomiting.  ED Course  Labs/Imaging on admission: I have personally reviewed following labs and imaging studies.  Initial vitals showed BP 134/73, pulse 69, RR 17, temp 97.4 F, SPO2 100% on room air.  Labs show sodium 119, potassium 3.2, chloride 83, bicarb 22, BUN 19, creatinine 1.26 (baseline 0.8-0.9), serum glucose 121, LFTs within normal limits, lipase 58, WBC 5.8, hemoglobin 9.8, platelets 392,000.  Urinalysis shows negative nitrates, large leukocytes, 11-20 RBC/hpf, >50 WBC/hpf, many bacteria on microscopy.  Patient was given 1 L normal saline, oral K 40 mEq, 1 g IV ceftriaxone.  The hospitalist service was consulted to admit for further evaluation and management.  Review of Systems: All systems reviewed and are negative except as documented in history of present illness  above.   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   Spondylolisthesis of lumbar region    Thrombocytopathy [facility]) 2011   Dr Ralene Ok    Past Surgical History:  Procedure Laterality Date   [place] DECOMP/DISCECTOMY FUSION N/A 09/22/2017   Procedure: ANTERIOR CERVICAL DECOMPRESSION AND FUSION CERVICAL THREE-FOUR.;  Surgeon: Eustace Moore, MD;  Location: [facility];  Service: Neurosurgery;  Laterality: N/A;  anterior   BACK SURGERY     BREAST EXCISIONAL BIOPSY Left    BREAST SURGERY     Left   LUMBAR LAMINECTOMY/DECOMPRESSION MICRODISCECTOMY Left 01/06/2018   Procedure: laminectomy Lumbar five -Sacral one - left, Lumbar two-Lumbar three - bilateral;  Surgeon: Eustace Moore, MD;  Location: [facility];  Service: Neurosurgery;  Laterality: Left;    Social History:  reports that she has never smoked. She has never used smokeless tobacco. She reports that she does not drink alcohol and does not use drugs.  Allergies  Allergen Reactions   Benazepril Cough    cough   Amlodipine  Leg edema at 5 mg/d   Fluad [Influenza Vac A&B Surf Ant Adj]     Very sick   Other     BAND-AID CAUSE SKIN TEARS/BRUISES   Codeine Nausea Only   Tramadol Other (See Comments)    Dizzy, nauseated    Family History  Problem Relation Age of Onset   Pancreatic cancer Mother    Arthritis Mother        RA   Early death Father 107       MI   Coronary artery disease Other        Female 1st degree relative Female <60   Lung cancer Other    Diabetes Sister    Breast  cancer Maternal Grandmother    Colon cancer Neg Hx    Stomach cancer Neg Hx      Prior to Admission medications   Medication Sig Start Date End Date Taking? Authorizing Provider  albuterol (PROAIR HFA) 108 (90 Base) MCG/ACT inhaler Inhale 2 puffs into the lungs every 4 (four) hours as needed. For shortness of breath 08/07/19   [name], [name], MD  amLODipine (NORVASC) 5 MG tablet TAKE 1 TABLET BY MOUTH EVERY DAY 08/18/21   [name], [name], MD  anagrelide (AGRYLIN) 1 MG capsule TAKE 1 CAPSULE BY MOUTH DAILY EXCEPT 2 DAYS A WEEK (TUESDAYS AND THURSDAYS) DO NOT TAKE. 12/07/21   Alla Feeling, NP  Ascorbic Acid (VITAMIN C) 1000 MG tablet Take 1,000 mg by mouth daily.    [provider]  aspirin 325 MG EC tablet Take 325 mg by mouth daily after breakfast.    [provider]  carvedilol (COREG) 25 MG tablet Take 1 tablet (25 mg total) by mouth 2 (two) times daily. 09/29/21 09/29/22  [name], [name], MD  Cholecalciferol (VITAMIN D3) 2000 units TABS Take 2,000 Units by mouth daily.    [provider]  ciprofloxacin (CIPRO) 500 MG tablet Take 1 tablet (500 mg total) by mouth 2 (two) times daily for 10 days. 01/15/22 01/25/22  [name], [name], MD  cloNIDine (CATAPRES - DOSED IN MG/24 HR) 0.1 mg/24hr patch Place 1 patch (0.1 mg total) onto the skin once a week. 07/30/21   [name], [name], MD  cloNIDine (CATAPRES) 0.1 MG tablet TAKE 1 TABLET (0.1 MG TOTAL) BY MOUTH 2 (TWO) TIMES DAILY AS NEEDED. TAKE IF BP> 160/110 12/01/21   [name], [name], MD  diphenoxylate-atropine (LOMOTIL) 2.5-0.025 MG tablet Take 1 tablet by mouth 4 (four) times daily as needed for diarrhea or loose stools. 01/14/22   [name], [name], MD  Fluticasone-Salmeterol (ADVAIR) 250-50 MCG/DOSE AEPB Please specify directions, refills and quantity 08/07/19   [name], [name], MD  gabapentin (NEURONTIN) 100 MG capsule TAKE 1 CAPSULE BY MOUTH THREE TIMES A DAY 07/13/21   [name], [name], MD   Multiple Vitamins-Minerals (ADULT GUMMY PO) Take 2 tablets by mouth daily.    [provider]  ondansetron (ZOFRAN) 4 MG tablet Take 1 tablet (4 mg total) by mouth every 8 (eight) hours as needed for nausea or vomiting. 01/14/22   [name], [name], MD  trolamine salicylate (ASPERCREME) 10 % cream Apply 1 application topically 2 (two) times daily as needed for muscle pain.    [provider]  valsartan-hydrochlorothiazide (DIOVAN-HCT) 320-25 MG tablet TAKE 1 TABLET BY MOUTH EVERY DAY 06/17/21   [name], [name], MD  losartan-hydrochlorothiazide (HYZAAR) 100-25 MG tablet Take 1 tablet by mouth daily. 04/03/19 06/28/19  [name], [name], MD  Physical Exam: Vitals:   01/14/22 2028 01/14/22 2115 01/14/22 2149 01/14/22 2200  BP: (!) 158/86 (!) 187/68  (!) 160/69  Pulse: 62 62  63  Resp: '16 11  10  '$ Temp:   97.8 F (36.6 C)   TempSrc:   Oral   SpO2: 99% 100%  100%  Weight:      Height:       Constitutional: Resting in bed, NAD, calm, comfortable Eyes: EOMI, lids and conjunctivae normal ENMT: Mucous membranes are dry. Posterior pharynx clear of any exudate or lesions.Normal dentition.  Neck: normal, supple, no masses. Respiratory: clear to auscultation bilaterally, no wheezing, no crackles. Normal respiratory effort. No accessory muscle use.  Cardiovascular: Regular rate and rhythm, no murmurs / rubs / gallops. No extremity edema. 2+ pedal pulses. Abdomen: no tenderness, no masses palpated. No hepatosplenomegaly.  Musculoskeletal: no clubbing / cyanosis. No joint deformity upper and lower extremities. Good ROM, no contractures. Normal muscle tone.  Skin: no rashes, lesions, ulcers. No induration Neurologic: Sensation intact. Strength 5/5 in all 4.  Psychiatric: Alert and oriented x 3. Normal mood.   EKG: Personally reviewed. EKG shows normal sinus rhythm without acute ischemic changes, rate 55, first-degree AV block.  Prolonged PR interval is new when compared  to prior from March 2019.  Assessment/Plan Principal Problem:   Acute on chronic hyponatremia Active Problems:   Bacteriuria   Nausea vomiting and diarrhea   Hypokalemia   AKI (acute kidney injury) ([place])   Essential hypertension   MPN (myeloproliferative neoplasm) ([place])   Asthma   Anemia   DALI KRANER is a 76 y.o. female with medical history significant for myeloproliferative neoplasm with essential thrombocythemia and anemia, hypertension, asthma, chronic hyponatremia who is admitted with acute on chronic hyponatremia.  Assessment and Plan: * Acute on chronic hyponatremia Acute on chronic hyponatremia with sodium 119 on admission.  Baseline appears to be in the low-mid 130s however worsening over the last 5 months.  Acute worsening likely due to hypovolemic hyponatremia due to GI losses. -S/p 1 L NS bolus in the ED -Start on IV NS'@75'$  mL/hour overnight -Check serum sodium every 4 hours x 3 -Check urine and serum osmolality, urine sodium -Hold home HCTZ  Bacteriuria Urinalysis concerning for UTI however patient denies any urinary symptoms. -S/p IV ceftriaxone in ED, hold further antibiotics for now -Add on urine culture  AKI (acute kidney injury) ([place]) Mild with creatinine 1.26 on admission compared to baseline 0.8-0.9.  Likely prerenal from hypovolemia due to GI losses. -Continue IV fluid hydration overnight as above -Hold home valsartan-HCTZ  Hypokalemia Secondary to GI losses.  Supplemented.  Check magnesium and replete if needed.  Nausea vomiting and diarrhea Likely due to recent viral gastroenteritis, symptoms appear to have resolved by time of admission.  Has not had any abdominal pain.  Continue to monitor.  Essential hypertension Continue home Coreg and clonidine patch.  Holding valsartan-HCTZ as above.  MPN (myeloproliferative neoplasm) [facility]) Following with hematology/oncology Dr. Burr Medico.  On current therapy with anagrelide which we will hold for now due to  potential GI side effect.  Anemia Anemia due to MPN.  Stable.  Asthma Stable without wheezing on admission.  Continue Symbicort and albuterol as needed.  DVT prophylaxis: enoxaparin (LOVENOX) injection 30 mg Start: 01/14/22 2200 Code Status: Full code, confirmed with patient on admission Family Communication: Discussed with daughter at bedside Disposition Plan: From home and likely discharge to home pending clinical progress Consults called: None Severity of Illness: The appropriate  patient status for this patient is INPATIENT. Inpatient status is judged to be reasonable and necessary in order to provide the required intensity of service to ensure the patient's safety. The patient's presenting symptoms, physical exam findings, and initial radiographic and laboratory data in the context of their chronic comorbidities is felt to place them at high risk for further clinical deterioration. Furthermore, it is not anticipated that the patient will be medically stable for discharge from the hospital within 2 midnights of admission.   * I certify that at the point of admission it is my clinical judgment that the patient will require inpatient hospital care spanning beyond 2 midnights from the point of admission due to high intensity of service, high risk for further deterioration and high frequency of surveillance required.Zada Finders MD [facility]  If 7PM-7AM, please contact night-coverage [email or web address]  01/14/2022, 10:57 PM

## 2022-01-14 NOTE — Assessment & Plan Note (Addendum)
Anemia due to MDS.  Stable.

## 2022-01-15 DIAGNOSIS — E871 Hypo-osmolality and hyponatremia: Secondary | ICD-10-CM | POA: Diagnosis not present

## 2022-01-15 LAB — CBC
HCT: 25.1 % — ABNORMAL LOW (ref 36.0–46.0)
Hemoglobin: 9.3 g/dL — ABNORMAL LOW (ref 12.0–15.0)
MCH: 31.6 pg (ref 26.0–34.0)
MCHC: 37.1 g/dL — ABNORMAL HIGH (ref 30.0–36.0)
MCV: 85.4 fL (ref 80.0–100.0)
Platelets: 358 10*3/uL (ref 150–400)
RBC: 2.94 MIL/uL — ABNORMAL LOW (ref 3.87–5.11)
RDW: 10.9 % — ABNORMAL LOW (ref 11.5–15.5)
WBC: 6.4 10*3/uL (ref 4.0–10.5)
nRBC: 0 % (ref 0.0–0.2)

## 2022-01-15 LAB — URINALYSIS, ROUTINE W REFLEX MICROSCOPIC
Bilirubin Urine: NEGATIVE
Ketones, ur: NEGATIVE
Nitrite: NEGATIVE
Specific Gravity, Urine: 1.015 (ref 1.000–1.030)
Urine Glucose: NEGATIVE
Urobilinogen, UA: 0.2 (ref 0.0–1.0)
pH: 6 (ref 5.0–8.0)

## 2022-01-15 LAB — BASIC METABOLIC PANEL
Anion gap: 9 (ref 5–15)
BUN: 14 mg/dL (ref 8–23)
CO2: 24 mmol/L (ref 22–32)
Calcium: 8.9 mg/dL (ref 8.9–10.3)
Chloride: 91 mmol/L — ABNORMAL LOW (ref 98–111)
Creatinine, Ser: 0.86 mg/dL (ref 0.44–1.00)
GFR, Estimated: >60 mL/min (ref >60)
Glucose, Bld: 92 mg/dL (ref 70–99)
Potassium: 3.4 mmol/L — ABNORMAL LOW (ref 3.5–5.1)
Sodium: 124 mmol/L — ABNORMAL LOW (ref 135–145)

## 2022-01-15 LAB — SODIUM: Sodium: 126 mmol/L — ABNORMAL LOW (ref 135–145)

## 2022-01-15 LAB — MAGNESIUM: Magnesium: 1.7 mg/dL (ref 1.7–2.4)

## 2022-01-15 MED ORDER — GABAPENTIN 100 MG PO CAPS
100.0000 mg | ORAL_CAPSULE | Freq: Three times a day (TID) | ORAL | Status: DC
  Administered 2022-01-15 – 2022-01-19 (×13): 100 mg via ORAL
  Filled 2022-01-15 (×13): qty 1

## 2022-01-15 MED ORDER — MUSCLE RUB 10-15 % EX CREA
1.0000 | TOPICAL_CREAM | Freq: Two times a day (BID) | CUTANEOUS | Status: DC | PRN
Start: 2022-01-15 — End: 2022-01-19

## 2022-01-15 MED ORDER — DIPHENOXYLATE-ATROPINE 2.5-0.025 MG PO TABS
1.0000 | ORAL_TABLET | Freq: Four times a day (QID) | ORAL | Status: DC | PRN
  Administered 2022-01-16 – 2022-01-19 (×4): 1 via ORAL
  Filled 2022-01-15 (×5): qty 1

## 2022-01-15 MED ORDER — VITAMIN D 25 MCG (1000 UNIT) PO TABS
2000.0000 [IU] | ORAL_TABLET | Freq: Every day | ORAL | Status: DC
  Administered 2022-01-15 – 2022-01-19 (×5): 2000 [IU] via ORAL
  Filled 2022-01-15 (×6): qty 2

## 2022-01-15 MED ORDER — ADULT GUMMY PO CHEW: CHEWABLE_TABLET | Freq: Every day | ORAL | Status: DC

## 2022-01-15 MED ORDER — ASPIRIN 325 MG PO TABS
325.0000 mg | ORAL_TABLET | Freq: Every day | ORAL | Status: DC
  Administered 2022-01-15 – 2022-01-19 (×5): 325 mg via ORAL
  Filled 2022-01-15 (×5): qty 1

## 2022-01-15 MED ORDER — ADULT MULTIVITAMIN W/MINERALS CH
1.0000 | ORAL_TABLET | Freq: Every day | ORAL | Status: DC
  Administered 2022-01-15 – 2022-01-19 (×5): 1 via ORAL
  Filled 2022-01-15 (×5): qty 1

## 2022-01-15 MED ORDER — ASCORBIC ACID 500 MG PO TABS
1000.0000 mg | ORAL_TABLET | Freq: Every day | ORAL | Status: DC
  Administered 2022-01-15 – 2022-01-19 (×5): 1000 mg via ORAL
  Filled 2022-01-15 (×5): qty 2

## 2022-01-15 MED ORDER — ANAGRELIDE HCL 0.5 MG PO CAPS
1.0000 mg | ORAL_CAPSULE | ORAL | Status: DC
  Administered 2022-01-16 – 2022-01-19 (×4): 1 mg via ORAL
  Filled 2022-01-15 (×4): qty 2

## 2022-01-15 NOTE — ED Notes (Signed)
Pt ambulated with assistance of her cain and wheel chair to bathroom. Pt refused bedside commode and wanted to go to bathroom. Pt is able to walk around good with her cain

## 2022-01-15 NOTE — Telephone Encounter (Signed)
Thx

## 2022-01-16 DIAGNOSIS — E440 Moderate protein-calorie malnutrition: Secondary | ICD-10-CM | POA: Diagnosis present

## 2022-01-16 DIAGNOSIS — E871 Hypo-osmolality and hyponatremia: Secondary | ICD-10-CM | POA: Diagnosis not present

## 2022-01-16 LAB — BASIC METABOLIC PANEL
Anion gap: 10 (ref 5–15)
BUN: 9 mg/dL (ref 8–23)
CO2: 24 mmol/L (ref 22–32)
Calcium: 9.1 mg/dL (ref 8.9–10.3)
Chloride: 93 mmol/L — ABNORMAL LOW (ref 98–111)
Creatinine, Ser: 0.73 mg/dL (ref 0.44–1.00)
GFR, Estimated: >60 mL/min (ref >60)
Glucose, Bld: 117 mg/dL — ABNORMAL HIGH (ref 70–99)
Potassium: 4 mmol/L (ref 3.5–5.1)
Sodium: 127 mmol/L — ABNORMAL LOW (ref 135–145)

## 2022-01-16 MED ORDER — ENOXAPARIN SODIUM 40 MG/0.4ML IJ SOSY
40.0000 mg | PREFILLED_SYRINGE | INTRAMUSCULAR | Status: DC
  Administered 2022-01-16 – 2022-01-18 (×3): 40 mg via SUBCUTANEOUS
  Filled 2022-01-16 (×3): qty 0.4

## 2022-01-16 MED ORDER — BOOST / RESOURCE BREEZE PO LIQD CUSTOM
1.0000 | Freq: Three times a day (TID) | ORAL | Status: DC
  Administered 2022-01-16 – 2022-01-19 (×9): 1 via ORAL

## 2022-01-16 MED ORDER — SODIUM CHLORIDE 0.9 % IV SOLN
1.0000 g | INTRAVENOUS | Status: DC
  Administered 2022-01-16: 1 g via INTRAVENOUS
  Filled 2022-01-16 (×2): qty 10

## 2022-01-16 NOTE — Plan of Care (Signed)
  Problem: Education: Goal: Knowledge of General Education information will improve Description Including pain rating scale, medication(s)/side effects and non-pharmacologic comfort measures Outcome: Progressing   Problem: Health Behavior/Discharge Planning: Goal: Ability to manage health-related needs will improve Outcome: Progressing   

## 2022-01-16 NOTE — Evaluation (Signed)
Occupational Therapy Evaluation Patient Details Name: Paula Griffith MRN: [id number] DOB: [birth year] Today's Date: 01/16/2022   History of Present Illness HPI:  Paula Griffith is a 76 y.o. female with medical history significant for myeloproliferative neoplasm with essential thrombocythemia and anemia, hypertension, asthma, chronic hyponatremia who presented to the ED for evaluation of worsening hyponatremia   Clinical Impression   Paula Griffith is a 76 year old woman who demonstrates her baseline independence with ADLs and functional mobility with straight cane. Patient lives alone but has frequent check ups by family. She reports she is at her normal which per her daughter is unsteady but she refuses to use a walker. From a functional standpoint she has no needs.     Recommendations for follow up therapy are one component of a multi-disciplinary discharge planning process, led by the attending physician.  Recommendations may be updated based on patient status, additional functional criteria and insurance authorization.   Follow Up Recommendations  No OT follow up    Assistance Recommended at Discharge PRN  Patient can return home with the following      Functional Status Assessment  Patient has not had a recent decline in their functional status  Equipment Recommendations  None recommended by OT    Recommendations for Other Services       Precautions / Restrictions Precautions Precautions: Fall Restrictions Weight Bearing Restrictions: No      Mobility Bed Mobility               General bed mobility comments: OOB    Transfers Overall transfer level: Modified independent Equipment used: Straight cane               General transfer comment: Ambulated in room and hallway with cane. No overt loss of balance.      Balance Overall balance assessment: Mild deficits observed, not formally tested                                          ADL either performed or assessed with clinical judgement   ADL Overall ADL's : Independent                                       General ADL Comments: Independent with toileting, grooming and donning LB clothing.     Vision Patient Visual Report: No change from baseline       Perception     Praxis      Pertinent Vitals/Pain Pain Assessment Pain Assessment: No/denies pain     Hand Dominance Right   Extremity/Trunk Assessment Upper Extremity Assessment Upper Extremity Assessment: Overall WFL for tasks assessed   Lower Extremity Assessment Lower Extremity Assessment: Overall WFL for tasks assessed   Cervical / Trunk Assessment Cervical / Trunk Assessment: Normal   Communication Communication Communication: No difficulties   Cognition Arousal/Alertness: Awake/alert Behavior During Therapy: WFL for tasks assessed/performed Overall Cognitive Status: Within Functional Limits for tasks assessed                                       General Comments       Exercises     Shoulder Instructions      Home Living  Family/patient expects to be discharged to:: Private residence Living Arrangements: Alone Available Help at Discharge: Family;Available PRN/intermittently (Has four daughters. Gets checked on frequently) Type of Home: House Home Access: Stairs to enter [employer] of Steps: 2   Home Layout: One level     Bathroom Shower/Tub: Tub/shower unit;Curtain   Bathroom Toilet: Handicapped height Bathroom Accessibility: Yes   Home Equipment: [place] - single point;Grab bars - tub/shower          Prior Functioning/Environment Prior Level of Function : Independent/Modified Independent             Mobility Comments: uses cane. Refuses to use  walker. ADLs Comments: independent. still drives.        OT Problem List: Impaired balance (sitting and/or standing)      OT Treatment/Interventions:      OT  Goals(Current goals can be found in the care plan section) Acute Rehab OT Goals OT Goal Formulation: All assessment and education complete, DC therapy  OT Frequency:      Co-evaluation              AM-PAC OT "6 Clicks" Daily Activity     Outcome Measure Help from another person eating meals?: None Help from another person taking care of personal grooming?: None Help from another person toileting, which includes using toliet, bedpan, or urinal?: None Help from another person bathing (including washing, rinsing, drying)?: None Help from another person to put on and taking off regular upper body clothing?: None Help from another person to put on and taking off regular lower body clothing?: None 6 Click Score: 24   End of Session    Activity Tolerance: Patient tolerated treatment well Patient left: in bed;with family/visitor present  OT Visit Diagnosis: Unsteadiness on feet (R26.81)                Time: 9826-4158 OT Time Calculation (min): 12 min Charges:  OT General Charges $OT Visit: 1 Visit OT Evaluation $OT Eval Low Complexity: 1 Low  Dakisha Schoof, OTR/L [place]  Office [phone] Pager: [phone]   Lenward Chancellor 01/16/2022, 10:09 AM

## 2022-01-16 NOTE — Progress Notes (Signed)
PT Cancellation Note / Screen  Patient Details Name: Paula Griffith MRN: [id number] DOB: 09-Oct-1945   Cancelled Treatment:    Reason Eval/Treat Not Completed: PT screened, no needs identified, will sign off Per OT, pt does not have any PT needs at this time.   Myrtis Hopping [place] 01/16/2022, 12:11 PM Jannette Spanner PT, DPT Physical Therapist Acute Rehabilitation Services Preferred contact method: Secure Chat Weekend Pager Only: [phone] Office: [phone]

## 2022-01-16 NOTE — Progress Notes (Signed)
[name] BDZ:[id number] DOB: 02/11/1946 DOA: 01/14/2022 PCP: Cassandria Anger, MD  Brief History   Paula Griffith is a 76 y.o. female with medical history significant for myeloproliferative neoplasm with essential thrombocythemia and anemia, hypertension, asthma, chronic hyponatremia who presented to the ED for evaluation of worsening hyponatremia.  Patient reported almost 2 weeks of nausea, vomiting, diarrhea.  She has not had any associated abdominal pain.  She has been feeling lethargic otherwise states that her GI symptoms have been improving and wants to go home.  She was seen by her PCP earlier today (7/19) at which time's labs were obtained.  She was found to have worsening hyponatremia with sodium 116 and she was called to come to the ED for further evaluation.  She denies any chest pain or dyspnea.  She denies any dysuria.  She denies any peripheral edema.  She has not been able to keep some of her medications down due to to her nausea and vomiting.   ED Course  Labs/Imaging on admission: I have personally reviewed following labs and imaging studies.   Initial vitals showed BP 134/73, pulse 69, RR 17, temp 97.4 F, SPO2 100% on room air.   Labs show sodium 119, potassium 3.2, chloride 83, bicarb 22, BUN 19, creatinine 1.26 (baseline 0.8-0.9), serum glucose 121, LFTs within normal limits, lipase 58, WBC 5.8, hemoglobin 9.8, platelets 392,000.   Urinalysis shows negative nitrates, large leukocytes, 11-20 RBC/hpf, >50 WBC/hpf, many bacteria on microscopy.   Patient was given 1 L normal saline, oral K 40 mEq, 1 g IV ceftriaxone.  The hospitalist service was consulted to admit for further evaluation and management.   This morning the patient's sodium is improved from 119 on admission to 126.   Consultants  None  Procedures  None  Antibiotics   Anti-infectives (From admission, onward)    Start     Dose/Rate Route Frequency Ordered Stop   01/16/22 0930   cefTRIAXone (ROCEPHIN) 1 g in sodium chloride 0.9 % 100 mL IVPB        1 g 200 mL/hr over 30 Minutes Intravenous Every 24 hours 01/16/22 0842     01/14/22 2100  cefTRIAXone (ROCEPHIN) 1 g in sodium chloride 0.9 % 100 mL IVPB        1 g 200 mL/hr over 30 Minutes Intravenous  Once 01/14/22 2050 01/14/22 2210      Subjective  The patient is resting comfortably. She states that she is feeling better.   Objective   Vitals:  Vitals:   01/16/22 0600 01/16/22 1321  BP: (!) 153/65 (!) 130/56  Pulse: 62 (!) 54  Resp: 15 20  Temp: 98.2 F (36.8 C) 98 F (36.7 C)  SpO2: 100% 100%   Exam:  Constitutional:  The patient is awake, alert, and oriented x 3. No acute distress. Respiratory:  No increased work of breathing. No wheezes, rales, or rhonchi No tactile fremitus Cardiovascular:  Regular rate and rhythm No murmurs, ectopy, or gallups. No lateral PMI. No thrills. Abdomen:  Abdomen is soft, non-tender, non-distended No hernias, masses, or organomegaly Normoactive bowel sounds.  Musculoskeletal:  No cyanosis, clubbing, or edema Skin:  No rashes, lesions, ulcers palpation of skin: no induration or nodules Neurologic:  CN 2-12 intact Sensation all 4 extremities intact Psychiatric:  Mental status Mood, affect appropriate Orientation to person, place, time  judgment and insight appear intact   I have personally reviewed the following:   Today's Data  Vitals  Lab Data  CBC BMP  Micro Data  Blood culture x 2 Urine culture  Imaging  CXR  Cardiology Data  EKG  Other Data    Scheduled Meds:  anagrelide  1 mg Oral Once per day on Sun Mon Wed Fri Sat   vitamin C  1,000 mg Oral Daily   aspirin  325 mg Oral QPC breakfast   carvedilol  25 mg Oral BID   cholecalciferol  2,000 Units Oral Daily   cloNIDine  0.1 mg Transdermal Weekly   enoxaparin (LOVENOX) injection  40 mg Subcutaneous Q24H   feeding supplement  1 Container Oral TID BM   gabapentin  100 mg Oral  TID   mometasone-formoterol  2 puff Inhalation BID   multivitamin with minerals  1 tablet Oral Daily   Continuous Infusions:  cefTRIAXone (ROCEPHIN)  IV 1 g (01/16/22 1151)    Principal Problem:   Acute on chronic hyponatremia Active Problems:   Bacteriuria   Nausea vomiting and diarrhea   Hypokalemia   AKI (acute kidney injury) ([place])   Essential hypertension   MPN (myeloproliferative neoplasm) (HCC)   Asthma   Anemia   Malnutrition of moderate degree   LOS: 2 days   A & P  Assessment and Plan: * Acute on chronic hyponatremia Acute on chronic hyponatremia with sodium 119 on admission.  Baseline appears to be in the low-mid 130s however worsening over the last 5 months.  Acute worsening likely due to hypovolemic hyponatremia due to GI losses. Improved to 127 this morning. Will add fluid restriction to improve sodium further.  Bacteriuria Urinalysis concerning for UTI however patient denies any urinary symptoms. -S/p IV ceftriaxone in ED, hold further antibiotics for now -Urine culture has grown out GNR. Patient has been started on rocephin. Monitor for identification and sensitivities.  AKI (acute kidney injury) ([place]) Mild with creatinine 1.26 on admission compared to baseline 0.8-0.9.  Likely prerenal from hypovolemia due to GI losses. -Creatinine is improved to 0.73. Monitor.  Hypokalemia Secondary to GI losses.  Supplemented. Resolved. Monitor. Magnesium 1.7. Will replace.  Nausea vomiting and diarrhea Resolved. Likely due to viral gastroenteritis.  Essential hypertension The patient is normotensive on carvedilol and clonidine. Continue to monitor.  MPN (myeloproliferative neoplasm) [facility]) Following with hematology/oncology Dr. Burr Medico.  On current therapy with anagrelide which we will hold for now due to potential GI side effect. Continue anagrelide.  Anemia Anemia due to MPN.  Stable.  Asthma Stable without wheezing on admission.  Continue Symbicort and albuterol  as needed.   I have seen and examined this patient myself. I have spent 34 minutes in her evaluation and care.  DVT prophylaxis: Lovenox Code Status: Full Code Family Communication: Daughter at bedside Disposition Plan: Home    Rollen Selders, DO [facility] Direct contact: see [email or web address]  7PM-7AM contact night coverage as above 01/16/2022, 7:25 PM  LOS: 2 days

## 2022-01-16 NOTE — Progress Notes (Signed)
Pt Daughter at bedside, has reported pt having apneic episodes.. pt was and is arousable, alert and oriented when awakened after episode.   Rn assessed pt - A/Ox4, breathing regular and clear. Pt placed on 2L oxygen [place] with O2 sensor to monitor pt   With nurse tech assistance - pt repositioned.   Will continue to monitor and assess pt throughout this shift PRN

## 2022-01-16 NOTE — Progress Notes (Signed)
PROGRESS NOTE  Paula Griffith DGL:[id number] DOB: 1945-12-16 DOA: 01/14/2022 PCP: Cassandria Anger, MD  Brief History   Paula Griffith is a 76 y.o. female with medical history significant for myeloproliferative neoplasm with essential thrombocythemia and anemia, hypertension, asthma, chronic hyponatremia who presented to the ED for evaluation of worsening hyponatremia.  Patient reported almost 2 weeks of nausea, vomiting, diarrhea.  She has not had any associated abdominal pain.  She has been feeling lethargic otherwise states that her GI symptoms have been improving and wants to go home.  She was seen by her PCP earlier today (7/19) at which time's labs were obtained.  She was found to have worsening hyponatremia with sodium 116 and she was called to come to the ED for further evaluation.  She denies any chest pain or dyspnea.  She denies any dysuria.  She denies any peripheral edema.  She has not been able to keep some of her medications down due to to her nausea and vomiting.   ED Course  Labs/Imaging on admission: I have personally reviewed following labs and imaging studies.   Initial vitals showed BP 134/73, pulse 69, RR 17, temp 97.4 F, SPO2 100% on room air.   Labs show sodium 119, potassium 3.2, chloride 83, bicarb 22, BUN 19, creatinine 1.26 (baseline 0.8-0.9), serum glucose 121, LFTs within normal limits, lipase 58, WBC 5.8, hemoglobin 9.8, platelets 392,000.   Urinalysis shows negative nitrates, large leukocytes, 11-20 RBC/hpf, >50 WBC/hpf, many bacteria on microscopy.   Patient was given 1 L normal saline, oral K 40 mEq, 1 g IV ceftriaxone.  The hospitalist service was consulted to admit for further evaluation and management.   This morning the patient's sodium is improved from 119 on admission to 126.   Consultants  None  Procedures  None  Antibiotics   Anti-infectives (From admission, onward)    Start     Dose/Rate Route Frequency Ordered Stop   01/16/22 0930   cefTRIAXone (ROCEPHIN) 1 g in sodium chloride 0.9 % 100 mL IVPB        1 g 200 mL/hr over 30 Minutes Intravenous Every 24 hours 01/16/22 0842     01/14/22 2100  cefTRIAXone (ROCEPHIN) 1 g in sodium chloride 0.9 % 100 mL IVPB        1 g 200 mL/hr over 30 Minutes Intravenous  Once 01/14/22 2050 01/14/22 2210      Subjective  The patient is resting comfortably. She states that she is feeling better.   Objective   Vitals:  Vitals:   01/16/22 0600 01/16/22 1321  BP: (!) 153/65 (!) 130/56  Pulse: 62 (!) 54  Resp: 15 20  Temp: 98.2 F (36.8 C) 98 F (36.7 C)  SpO2: 100% 100%   Exam:  Constitutional:  The patient is awake, alert, and oriented x 3. No acute distress. Respiratory:  No increased work of breathing. No wheezes, rales, or rhonchi No tactile fremitus Cardiovascular:  Regular rate and rhythm No murmurs, ectopy, or gallups. No lateral PMI. No thrills. Abdomen:  Abdomen is soft, non-tender, non-distended No hernias, masses, or organomegaly Normoactive bowel sounds.  Musculoskeletal:  No cyanosis, clubbing, or edema Skin:  No rashes, lesions, ulcers palpation of skin: no induration or nodules Neurologic:  CN 2-12 intact Sensation all 4 extremities intact Psychiatric:  Mental status Mood, affect appropriate Orientation to person, place, time  judgment and insight appear intact   I have personally reviewed the following:   Today's Data  Vitals  Lab Data  CBC BMP  Micro Data  Blood culture x 2 Urine culture  Imaging  CXR  Cardiology Data  EKG  Other Data    Scheduled Meds:  anagrelide  1 mg Oral Once per day on Sun Mon Wed Fri Sat   vitamin C  1,000 mg Oral Daily   aspirin  325 mg Oral QPC breakfast   carvedilol  25 mg Oral BID   cholecalciferol  2,000 Units Oral Daily   cloNIDine  0.1 mg Transdermal Weekly   enoxaparin (LOVENOX) injection  40 mg Subcutaneous Q24H   feeding supplement  1 Container Oral TID BM   gabapentin  100 mg Oral  TID   mometasone-formoterol  2 puff Inhalation BID   multivitamin with minerals  1 tablet Oral Daily   Continuous Infusions:  cefTRIAXone (ROCEPHIN)  IV 1 g (01/16/22 1151)    Principal Problem:   Acute on chronic hyponatremia Active Problems:   Bacteriuria   Nausea vomiting and diarrhea   Hypokalemia   AKI (acute kidney injury) ([place])   Essential hypertension   MPN (myeloproliferative neoplasm) (HCC)   Asthma   Anemia   Malnutrition of moderate degree   LOS: 2 days   A & P  Assessment and Plan: * Acute on chronic hyponatremia Acute on chronic hyponatremia with sodium 119 on admission.  Baseline appears to be in the low-mid 130s however worsening over the last 5 months.  Acute worsening likely due to hypovolemic hyponatremia due to GI losses. -S/p 1 L NS bolus in the ED -Start on IV NS'@75'$  mL/hour overnight -Check serum sodium every 4 hours x 3 -Check urine and serum osmolality, urine sodium -Hold home HCTZ  Bacteriuria Urinalysis concerning for UTI however patient denies any urinary symptoms. -S/p IV ceftriaxone in ED, hold further antibiotics for now -Add on urine culture  AKI (acute kidney injury) ([place]) Mild with creatinine 1.26 on admission compared to baseline 0.8-0.9.  Likely prerenal from hypovolemia due to GI losses. -Continue IV fluid hydration overnight as above -Hold home valsartan-HCTZ  Hypokalemia Secondary to GI losses.  Supplemented.  Check magnesium and replete if needed.  Nausea vomiting and diarrhea Likely due to recent viral gastroenteritis, symptoms appear to have resolved by time of admission.  Has not had any abdominal pain.  Continue to monitor.  Essential hypertension Continue home Coreg and clonidine patch.  Holding valsartan-HCTZ as above.  MPN (myeloproliferative neoplasm) [facility]) Following with hematology/oncology Dr. Burr Medico.  On current therapy with anagrelide which we will hold for now due to potential GI side effect.  Anemia Anemia due  to MPN.  Stable.  Asthma Stable without wheezing on admission.  Continue Symbicort and albuterol as needed.   I have seen and examined this patient myself. I have spent 34 minutes in her evaluation and care.  DVT prophylaxis: Lovenox Code Status: Full Code Family Communication: Daughter at bedside Disposition Plan: Home    Reegan Mctighe, DO [facility] Direct contact: see [email or web address]  7PM-7AM contact night coverage as above 01/16/2022, 5:34 PM  LOS: 2 days

## 2022-01-16 NOTE — Progress Notes (Signed)
Initial Nutrition Assessment  DOCUMENTATION CODES:   Non-severe (moderate) malnutrition in context of acute illness/injury  INTERVENTION:   -Boost Breeze po TID, each supplement provides 250 kcal and 9 grams of protein  -Liberalize heart healthy diet to regular to maximize menu options and pt was admitted for hyponatremia.   NUTRITION DIAGNOSIS:   Moderate Malnutrition related to acute illness, nausea, vomiting, diarrhea as evidenced by percent weight loss, mild muscle depletion.  GOAL:   Patient will meet greater than or equal to 90% of their needs  MONITOR:   PO intake, Supplement acceptance, Labs, Weight trends, I & O's  REASON FOR ASSESSMENT:   Malnutrition Screening Tool    ASSESSMENT:   76 y.o. female with medical history significant for myeloproliferative neoplasm with essential thrombocythemia and anemia, hypertension, asthma, chronic hyponatremia who presented to the ED for evaluation of worsening hyponatremia.  Patient in room with daughter at bedside. Pt was having N/V and diarrhea for 2 weeks PTA. Pt was not eating well during this time. Now feels afraid to eat anything as she feels it will cause diarrhea. Encouraged easy to digest foods such as bananas, rice, applesauce and toast. Pt denies any issues with chewing or swallowing. Agreeable to trying [facility], has not tolerated Ensure supplements well.   Pt reports UBW of ~124 lbs. Daughter states UBW is ~135-140 lbs. Per weight records, pt has lost 11 lbs since 6/26  Medications: Vitamin C, Vitamin D, Multivitamin with minerals daily, Lomotil  Labs reviewed: Low Na   NUTRITION - FOCUSED PHYSICAL EXAM:  Flowsheet Row Most Recent Value  Orbital Region No depletion  Upper Arm Region No depletion  Thoracic and Lumbar Region No depletion  Buccal Region No depletion  Temple Region Mild depletion  Clavicle Bone Region Mild depletion  Clavicle and Acromion Bone Region Mild depletion  Scapular Bone Region  No depletion  Dorsal Hand No depletion  Patellar Region Unable to assess  Anterior Thigh Region Unable to assess  Posterior Calf Region Unable to assess  Edema (RD Assessment) None  Hair Reviewed  Eyes Reviewed  Mouth Reviewed  [missing teeth]  Skin Reviewed       Diet Order:   Diet Order             Diet regular Room service appropriate? Yes; Fluid consistency: Thin; Fluid restriction: 1500 mL Fluid  Diet effective now                   EDUCATION NEEDS:   No education needs have been identified at this time  Skin:  Skin Assessment: Reviewed RN Assessment  Last BM:  7/21  Height:   Ht Readings from Last 1 Encounters:  01/14/22 '5\' 2"'$  (1.575 m)    Weight:   Wt Readings from Last 1 Encounters:  01/14/22 54.4 kg    BMI:  Body mass index is 21.95 kg/m.  Estimated Nutritional Needs:   Kcal:  1450-1650  Protein:  65-75g  Fluid:  1.6L/day   Clayton Bibles, MS, RD, LDN Inpatient Clinical Dietitian Contact information available via Amion

## 2022-01-17 DIAGNOSIS — E871 Hypo-osmolality and hyponatremia: Secondary | ICD-10-CM | POA: Diagnosis not present

## 2022-01-17 LAB — CBC WITH DIFFERENTIAL/PLATELET
Abs Immature Granulocytes: 0.02 10*3/uL (ref 0.00–0.07)
Basophils Absolute: 0 10*3/uL (ref 0.0–0.1)
Basophils Relative: 1 %
Eosinophils Absolute: 0.1 10*3/uL (ref 0.0–0.5)
Eosinophils Relative: 1 %
HCT: 22.2 % — ABNORMAL LOW (ref 36.0–46.0)
Hemoglobin: 7.8 g/dL — ABNORMAL LOW (ref 12.0–15.0)
Immature Granulocytes: 0 %
Lymphocytes Relative: 20 %
Lymphs Abs: 1 10*3/uL (ref 0.7–4.0)
MCH: 31.7 pg (ref 26.0–34.0)
MCHC: 35.1 g/dL (ref 30.0–36.0)
MCV: 90.2 fL (ref 80.0–100.0)
Monocytes Absolute: 0.5 10*3/uL (ref 0.1–1.0)
Monocytes Relative: 11 %
Neutro Abs: 3.3 10*3/uL (ref 1.7–7.7)
Neutrophils Relative %: 67 %
Platelets: 219 10*3/uL (ref 150–400)
RBC: 2.46 MIL/uL — ABNORMAL LOW (ref 3.87–5.11)
RDW: 11.3 % — ABNORMAL LOW (ref 11.5–15.5)
WBC: 4.9 10*3/uL (ref 4.0–10.5)
nRBC: 0 % (ref 0.0–0.2)

## 2022-01-17 LAB — BASIC METABOLIC PANEL
Anion gap: 8 (ref 5–15)
Anion gap: 10 (ref 5–15)
BUN: 10 mg/dL (ref 8–23)
BUN: 31 mg/dL — ABNORMAL HIGH (ref 8–23)
CO2: 24 mmol/L (ref 22–32)
CO2: 23 mmol/L (ref 22–32)
Calcium: 8.8 mg/dL — ABNORMAL LOW (ref 8.9–10.3)
Calcium: 9.1 mg/dL (ref 8.9–10.3)
Chloride: 93 mmol/L — ABNORMAL LOW (ref 98–111)
Chloride: 93 mmol/L — ABNORMAL LOW (ref 98–111)
Creatinine, Ser: 0.67 mg/dL (ref 0.44–1.00)
Creatinine, Ser: 0.74 mg/dL (ref 0.44–1.00)
GFR, Estimated: >60 mL/min (ref >60)
GFR, Estimated: >60 mL/min (ref >60)
Glucose, Bld: 116 mg/dL — ABNORMAL HIGH (ref 70–99)
Glucose, Bld: 107 mg/dL — ABNORMAL HIGH (ref 70–99)
Potassium: 4.2 mmol/L (ref 3.5–5.1)
Potassium: 4.1 mmol/L (ref 3.5–5.1)
Sodium: 125 mmol/L — ABNORMAL LOW (ref 135–145)
Sodium: 126 mmol/L — ABNORMAL LOW (ref 135–145)

## 2022-01-17 LAB — URINE CULTURE: Culture: >=100000 — AB

## 2022-01-17 MED ORDER — UREA 15 G PO PACK
15.0000 g | PACK | Freq: Two times a day (BID) | ORAL | Status: DC
Start: 2022-01-17 — End: 2022-01-19
  Administered 2022-01-17 – 2022-01-19 (×5): 15 g via ORAL
  Filled 2022-01-17 (×5): qty 1

## 2022-01-17 MED ORDER — SODIUM CHLORIDE 0.9 % IV SOLN
1.0000 g | Freq: Two times a day (BID) | INTRAVENOUS | Status: DC
  Administered 2022-01-17: 1 g via INTRAVENOUS
  Filled 2022-01-17 (×2): qty 10

## 2022-01-17 MED ORDER — DEMECLOCYCLINE HCL 150 MG PO TABS
300.0000 mg | ORAL_TABLET | Freq: Two times a day (BID) | ORAL | Status: DC
Start: 2022-01-17 — End: 2022-01-17
  Filled 2022-01-17: qty 2

## 2022-01-17 MED ORDER — CEFAZOLIN SODIUM-DEXTROSE 1-4 GM/50ML-% IV SOLN
1.0000 g | Freq: Three times a day (TID) | INTRAVENOUS | Status: DC
  Filled 2022-01-17: qty 50

## 2022-01-17 MED ORDER — SODIUM CHLORIDE 0.9 % IV SOLN: 1.0000 g | Freq: Three times a day (TID) | INTRAVENOUS | Status: DC

## 2022-01-17 MED ORDER — SODIUM CHLORIDE 0.9 % IV SOLN
1.0000 g | INTRAVENOUS | Status: DC
  Administered 2022-01-17 – 2022-01-18 (×2): 1 g via INTRAVENOUS
  Filled 2022-01-17 (×2): qty 10

## 2022-01-17 NOTE — Progress Notes (Signed)
PROGRESS NOTE  Paula Griffith OZD:[id number] DOB: [birth year] DOA: 01/14/2022 PCP: Cassandria Anger, MD  Brief History   Paula Griffith is a 76 y.o. female with medical history significant for myeloproliferative neoplasm with essential thrombocythemia and anemia, hypertension, asthma, chronic hyponatremia who presented to the ED for evaluation of worsening hyponatremia.  Patient reported almost 2 weeks of nausea, vomiting, diarrhea.  She has not had any associated abdominal pain.  She has been feeling lethargic otherwise states that her GI symptoms have been improving and wants to go home.  She was seen by her PCP earlier today (7/19) at which time's labs were obtained.  She was found to have worsening hyponatremia with sodium 116 and she was called to come to the ED for further evaluation.  She denies any chest pain or dyspnea.  She denies any dysuria.  She denies any peripheral edema.  She has not been able to keep some of her medications down due to to her nausea and vomiting.   ED Course  Labs/Imaging on admission: I have personally reviewed following labs and imaging studies.   Initial vitals showed BP 134/73, pulse 69, RR 17, temp 97.4 F, SPO2 100% on room air.   Labs show sodium 119, potassium 3.2, chloride 83, bicarb 22, BUN 19, creatinine 1.26 (baseline 0.8-0.9), serum glucose 121, LFTs within normal limits, lipase 58, WBC 5.8, hemoglobin 9.8, platelets 392,000.   Urinalysis shows negative nitrates, large leukocytes, 11-20 RBC/hpf, >50 WBC/hpf, many bacteria on microscopy.   Patient was given 1 L normal saline, oral K 40 mEq, 1 g IV ceftriaxone.  The hospitalist service was consulted to admit for further evaluation and management.   This morning the patient's sodium had improved from 119 on admission to 127. Today it has dropped to 125. She has been placed on urea.  Consultants  None  Procedures  None  Antibiotics   Anti-infectives (From admission, onward)    Start      Dose/Rate Route Frequency Ordered Stop   01/17/22 2200  ceFAZolin (ANCEF) IVPB 1 g/50 mL premix  Status:  Discontinued        1 g 100 mL/hr over 30 Minutes Intravenous Every 8 hours 01/17/22 1425 01/17/22 1729   01/17/22 2200  cefTRIAXone (ROCEPHIN) 1 g in sodium chloride 0.9 % 100 mL IVPB        1 g 200 mL/hr over 30 Minutes Intravenous Every 24 hours 01/17/22 1729     01/17/22 1400  ceFEPIme (MAXIPIME) 1 g in sodium chloride 0.9 % 100 mL IVPB  Status:  Discontinued        1 g 200 mL/hr over 30 Minutes Intravenous Every 8 hours 01/17/22 0918 01/17/22 0918   01/17/22 1215  demeclocycline (DECLOMYCIN) tablet 300 mg  Status:  Discontinued        300 mg Oral Every 12 hours 01/17/22 1124 01/17/22 1252   01/17/22 1015  ceFEPIme (MAXIPIME) 1 g in sodium chloride 0.9 % 100 mL IVPB  Status:  Discontinued        1 g 200 mL/hr over 30 Minutes Intravenous Every 12 hours 01/17/22 0918 01/17/22 1425   01/16/22 0930  cefTRIAXone (ROCEPHIN) 1 g in sodium chloride 0.9 % 100 mL IVPB  Status:  Discontinued        1 g 200 mL/hr over 30 Minutes Intravenous Every 24 hours 01/16/22 0842 01/17/22 0918   01/14/22 2100  cefTRIAXone (ROCEPHIN) 1 g in sodium chloride 0.9 % 100 mL IVPB  1 g 200 mL/hr over 30 Minutes Intravenous  Once 01/14/22 2050 01/14/22 2210      Subjective  The patient is resting comfortably. She states that she is feeling better. Daughter is at bedside.  Objective   Vitals:  Vitals:   01/17/22 0803 01/17/22 1253  BP:  (!) 123/54  Pulse:  67  Resp:  16  Temp:  98.1 F (36.7 C)  SpO2: 98% 100%   Exam:  Constitutional:  The patient is awake, alert, and oriented x 3. No acute distress. Respiratory:  No increased work of breathing. No wheezes, rales, or rhonchi No tactile fremitus Cardiovascular:  Regular rate and rhythm No murmurs, ectopy, or gallups. No lateral PMI. No thrills. Abdomen:  Abdomen is soft, non-tender, non-distended No hernias, masses, or  organomegaly Normoactive bowel sounds.  Musculoskeletal:  No cyanosis, clubbing, or edema Skin:  No rashes, lesions, ulcers palpation of skin: no induration or nodules Neurologic:  CN 2-12 intact Sensation all 4 extremities intact Psychiatric:  Mental status Mood, affect appropriate Orientation to person, place, time  judgment and insight appear intact   I have personally reviewed the following:   Today's Data  Vitals  Lab Data  CBC BMP  Micro Data  Blood culture x 2 Urine culture  Imaging  CXR  Cardiology Data  EKG  Other Data    Scheduled Meds:  anagrelide  1 mg Oral Once per day on Sun Mon Wed Fri Sat   vitamin C  1,000 mg Oral Daily   aspirin  325 mg Oral QPC breakfast   carvedilol  25 mg Oral BID   cholecalciferol  2,000 Units Oral Daily   cloNIDine  0.1 mg Transdermal Weekly   enoxaparin (LOVENOX) injection  40 mg Subcutaneous Q24H   feeding supplement  1 Container Oral TID BM   gabapentin  100 mg Oral TID   mometasone-formoterol  2 puff Inhalation BID   multivitamin with minerals  1 tablet Oral Daily   urea  15 g Oral BID   Continuous Infusions:  cefTRIAXone (ROCEPHIN)  IV      Principal Problem:   Acute on chronic hyponatremia Active Problems:   Bacteriuria   Nausea vomiting and diarrhea   Hypokalemia   AKI (acute kidney injury) ([place])   Essential hypertension   MPN (myeloproliferative neoplasm) (HCC)   Asthma   Anemia   Malnutrition of moderate degree   LOS: 3 days   A & P  Assessment and Plan: * Acute on chronic hyponatremia Patient has a history of chronic hyponatremia. She presented to ED with a sodium of 119. This improved to 125 by the following morning. On 01/16/2022 the patient's sodium was 127. This morning it was back down to 125. She was on fluid restriction. She has been started on urea. Monitor.  Bacteriuria Urinalysis concerning for UTI however patient denies any urinary symptoms. -S/p IV ceftriaxone in ED, hold  further antibiotics for now -Urine culture has grown out E. Coli and Klebsiella pneumoniae. Sensitivities have returned the patient will be changed to Rocephin.  AKI (acute kidney injury) ([place]) Mild with creatinine 1.26 on admission compared to baseline 0.8-0.9.  Likely prerenal from hypovolemia due to GI losses. -Creatinine is improved to 0.67. Monitor.  Hypokalemia Secondary to GI losses.  Supplemented. Resolved. Monitor. Magnesium 1.7. Will replace.  Nausea vomiting and diarrhea Resolved. Likely due to viral gastroenteritis.  Essential hypertension The patient is normotensive on carvedilol and clonidine. Continue to monitor.  MPN (myeloproliferative neoplasm) ([place]) Following  with hematology/oncology Dr. Burr Medico.  On current therapy with anagrelide which we will hold for now due to potential GI side effect. Continue anagrelide.  Malnutrition of moderate degree Nutrition consult.  Anemia Anemia due to MDS.  Stable.  Asthma Stable without wheezing on admission.  Continue Symbicort and albuterol as needed. Resolved.   I have seen and examined this patient myself. I have spent 32 minutes in her evaluation and care.  DVT prophylaxis: Lovenox Code Status: Full Code Family Communication: Daughter at bedside Disposition Plan: Home    Paula Rohner, DO [facility] Direct contact: see [email or web address]  7PM-7AM contact night coverage as above 01/17/2022, 5:36 PM  LOS: 2 days

## 2022-01-17 NOTE — Assessment & Plan Note (Signed)
Nutrition consult

## 2022-01-17 NOTE — Plan of Care (Signed)
  Problem: Education: Goal: Knowledge of General Education information will improve Description Including pain rating scale, medication(s)/side effects and non-pharmacologic comfort measures Outcome: Progressing   Problem: Health Behavior/Discharge Planning: Goal: Ability to manage health-related needs will improve Outcome: Progressing   

## 2022-01-18 DIAGNOSIS — E871 Hypo-osmolality and hyponatremia: Secondary | ICD-10-CM | POA: Diagnosis not present

## 2022-01-18 LAB — CBC WITH DIFFERENTIAL/PLATELET
Abs Immature Granulocytes: 0.02 10*3/uL (ref 0.00–0.07)
Basophils Absolute: 0 10*3/uL (ref 0.0–0.1)
Basophils Relative: 1 %
Eosinophils Absolute: 0.1 10*3/uL (ref 0.0–0.5)
Eosinophils Relative: 1 %
HCT: 21.2 % — ABNORMAL LOW (ref 36.0–46.0)
Hemoglobin: 7.5 g/dL — ABNORMAL LOW (ref 12.0–15.0)
Immature Granulocytes: 0 %
Lymphocytes Relative: 22 %
Lymphs Abs: 1.2 10*3/uL (ref 0.7–4.0)
MCH: 31.4 pg (ref 26.0–34.0)
MCHC: 35.4 g/dL (ref 30.0–36.0)
MCV: 88.7 fL (ref 80.0–100.0)
Monocytes Absolute: 0.6 10*3/uL (ref 0.1–1.0)
Monocytes Relative: 11 %
Neutro Abs: 3.5 10*3/uL (ref 1.7–7.7)
Neutrophils Relative %: 65 %
Platelets: 242 10*3/uL (ref 150–400)
RBC: 2.39 MIL/uL — ABNORMAL LOW (ref 3.87–5.11)
RDW: 11.1 % — ABNORMAL LOW (ref 11.5–15.5)
WBC: 5.4 10*3/uL (ref 4.0–10.5)
nRBC: 0 % (ref 0.0–0.2)

## 2022-01-18 LAB — RETICULOCYTES
Immature Retic Fract: 6.5 % (ref 2.3–15.9)
RBC.: 2.44 MIL/uL — ABNORMAL LOW (ref 3.87–5.11)
Retic Count, Absolute: 31.7 10*3/uL (ref 19.0–186.0)
Retic Ct Pct: 1.3 % (ref 0.4–3.1)

## 2022-01-18 LAB — IRON AND TIBC
Iron: 110 ug/dL (ref 28–170)
Saturation Ratios: 38 % — ABNORMAL HIGH (ref 10.4–31.8)
TIBC: 292 ug/dL (ref 250–450)
UIBC: 182 ug/dL

## 2022-01-18 LAB — BASIC METABOLIC PANEL
Anion gap: 9 (ref 5–15)
BUN: 43 mg/dL — ABNORMAL HIGH (ref 8–23)
CO2: 23 mmol/L (ref 22–32)
Calcium: 9.2 mg/dL (ref 8.9–10.3)
Chloride: 95 mmol/L — ABNORMAL LOW (ref 98–111)
Creatinine, Ser: 0.7 mg/dL (ref 0.44–1.00)
GFR, Estimated: >60 mL/min (ref >60)
Glucose, Bld: 94 mg/dL (ref 70–99)
Potassium: 4.5 mmol/L (ref 3.5–5.1)
Sodium: 127 mmol/L — ABNORMAL LOW (ref 135–145)

## 2022-01-18 LAB — VITAMIN B12: Vitamin B-12: 452 pg/mL (ref 180–914)

## 2022-01-18 LAB — FOLATE: Folate: 26.6 ng/mL (ref >5.9)

## 2022-01-18 LAB — FERRITIN: Ferritin: 174 ng/mL (ref 11–307)

## 2022-01-18 MED ORDER — MAGNESIUM SULFATE 2 GM/50ML IV SOLN
2.0000 g | Freq: Once | INTRAVENOUS | Status: AC
  Administered 2022-01-18: 2 g via INTRAVENOUS
  Filled 2022-01-18: qty 50

## 2022-01-18 NOTE — Plan of Care (Signed)
  Problem: Education: Goal: Knowledge of General Education information will improve Description Including pain rating scale, medication(s)/side effects and non-pharmacologic comfort measures Outcome: Progressing   Problem: Health Behavior/Discharge Planning: Goal: Ability to manage health-related needs will improve Outcome: Progressing   

## 2022-01-18 NOTE — Progress Notes (Signed)
PROGRESS NOTE  Paula Griffith FYB:[id number] DOB: 04-Feb-1946 DOA: 01/14/2022 PCP: Cassandria Anger, MD  Brief History   Paula Griffith is a 76 y.o. female with medical history significant for myeloproliferative neoplasm with essential thrombocythemia and anemia, hypertension, asthma, chronic hyponatremia who presented to the ED for evaluation of worsening hyponatremia.  Patient reported almost 2 weeks of nausea, vomiting, diarrhea.  She has not had any associated abdominal pain.  She has been feeling lethargic otherwise states that her GI symptoms have been improving and wants to go home.  She was seen by her PCP earlier today (7/19) at which time's labs were obtained.  She was found to have worsening hyponatremia with sodium 116 and she was called to come to the ED for further evaluation.  She denies any chest pain or dyspnea.  She denies any dysuria.  She denies any peripheral edema.  She has not been able to keep some of her medications down due to to her nausea and vomiting.   ED Course  Labs/Imaging on admission: I have personally reviewed following labs and imaging studies.   Initial vitals showed BP 134/73, pulse 69, RR 17, temp 97.4 F, SPO2 100% on room air.   Labs show sodium 119, potassium 3.2, chloride 83, bicarb 22, BUN 19, creatinine 1.26 (baseline 0.8-0.9), serum glucose 121, LFTs within normal limits, lipase 58, WBC 5.8, hemoglobin 9.8, platelets 392,000.   Urinalysis shows negative nitrates, large leukocytes, 11-20 RBC/hpf, >50 WBC/hpf, many bacteria on microscopy.   Patient was given 1 L normal saline, oral K 40 mEq, 1 g IV ceftriaxone.  The hospitalist service was consulted to admit for further evaluation and management.   This morning the patient's sodium had improved from 119 on admission to 127. Today it has dropped to 125. She has been placed on urea.  Consultants  None  Procedures  None  Antibiotics   Anti-infectives (From admission, onward)    Start      Dose/Rate Route Frequency Ordered Stop   01/17/22 2200  ceFAZolin (ANCEF) IVPB 1 g/50 mL premix  Status:  Discontinued        1 g 100 mL/hr over 30 Minutes Intravenous Every 8 hours 01/17/22 1425 01/17/22 1729   01/17/22 2200  cefTRIAXone (ROCEPHIN) 1 g in sodium chloride 0.9 % 100 mL IVPB        1 g 200 mL/hr over 30 Minutes Intravenous Every 24 hours 01/17/22 1729     01/17/22 1400  ceFEPIme (MAXIPIME) 1 g in sodium chloride 0.9 % 100 mL IVPB  Status:  Discontinued        1 g 200 mL/hr over 30 Minutes Intravenous Every 8 hours 01/17/22 0918 01/17/22 0918   01/17/22 1215  demeclocycline (DECLOMYCIN) tablet 300 mg  Status:  Discontinued        300 mg Oral Every 12 hours 01/17/22 1124 01/17/22 1252   01/17/22 1015  ceFEPIme (MAXIPIME) 1 g in sodium chloride 0.9 % 100 mL IVPB  Status:  Discontinued        1 g 200 mL/hr over 30 Minutes Intravenous Every 12 hours 01/17/22 0918 01/17/22 1425   01/16/22 0930  cefTRIAXone (ROCEPHIN) 1 g in sodium chloride 0.9 % 100 mL IVPB  Status:  Discontinued        1 g 200 mL/hr over 30 Minutes Intravenous Every 24 hours 01/16/22 0842 01/17/22 0918   01/14/22 2100  cefTRIAXone (ROCEPHIN) 1 g in sodium chloride 0.9 % 100 mL IVPB  1 g 200 mL/hr over 30 Minutes Intravenous  Once 01/14/22 2050 01/14/22 2210      Subjective  The patient is resting comfortably. She states that she is feeling better. Daughter is at bedside.  Objective   Vitals:  Vitals:   01/18/22 0821 01/18/22 1221  BP:  (!) 143/52  Pulse:  68  Resp:  18  Temp:  98.8 F (37.1 C)  SpO2: 99% 100%   Exam:  Constitutional:  The patient is awake, alert, and oriented x 3. No acute distress. Respiratory:  No increased work of breathing. No wheezes, rales, or rhonchi No tactile fremitus Cardiovascular:  Regular rate and rhythm No murmurs, ectopy, or gallups. No lateral PMI. No thrills. Abdomen:  Abdomen is soft, non-tender, non-distended No hernias, masses, or  organomegaly Normoactive bowel sounds.  Musculoskeletal:  No cyanosis, clubbing, or edema Skin:  No rashes, lesions, ulcers palpation of skin: no induration or nodules Neurologic:  CN 2-12 intact Sensation all 4 extremities intact Psychiatric:  Mental status Mood, affect appropriate Orientation to person, place, time  judgment and insight appear intact   I have personally reviewed the following:   Today's Data  Vitals  Lab Data  CBC BMP  Micro Data  Blood culture x 2 Urine culture  Imaging  CXR  Cardiology Data  EKG  Other Data    Scheduled Meds:  anagrelide  1 mg Oral Once per day on Sun Mon Wed Fri Sat   vitamin C  1,000 mg Oral Daily   aspirin  325 mg Oral QPC breakfast   carvedilol  25 mg Oral BID   cholecalciferol  2,000 Units Oral Daily   cloNIDine  0.1 mg Transdermal Weekly   enoxaparin (LOVENOX) injection  40 mg Subcutaneous Q24H   feeding supplement  1 Container Oral TID BM   gabapentin  100 mg Oral TID   mometasone-formoterol  2 puff Inhalation BID   multivitamin with minerals  1 tablet Oral Daily   urea  15 g Oral BID   Continuous Infusions:  cefTRIAXone (ROCEPHIN)  IV Stopped (01/17/22 2153)    Principal Problem:   Acute on chronic hyponatremia Active Problems:   Bacteriuria   Nausea vomiting and diarrhea   Hypokalemia   AKI (acute kidney injury) ([place])   Essential hypertension   MPN (myeloproliferative neoplasm) ([facility])   Asthma   Anemia   Malnutrition of moderate degree   LOS: 4 days   A & P  Assessment and Plan: * Acute on chronic hyponatremia Patient has a history of chronic hyponatremia. She presented to ED with a sodium of 119. This improved to 125 by the following morning. On 01/16/2022 the patient's sodium was 127. It then dropped to 125 again on 01/17/2022. She was started on urea. This morning it is back up to 127. Continue to monitor.  Bacteriuria Urinalysis concerning for UTI however patient denies any urinary  symptoms. -S/p IV ceftriaxone in ED, hold further antibiotics for now -Urine culture has grown out E. Coli and Klebsiella pneumoniae. Sensitivities have returned the patient will be changed to Rocephin.  AKI (acute kidney injury) ([place]) Mild with creatinine 1.26 on admission compared to baseline 0.8-0.9.  Likely prerenal from hypovolemia due to GI losses. -Creatinine is improved to 0.70. Monitor.  Hypokalemia Secondary to GI losses.  Supplemented. Resolved. Monitor. Magnesium 1.7. Will replace.  Nausea vomiting and diarrhea Resolved. Likely due to viral gastroenteritis.  Essential hypertension The patient is normotensive on carvedilol and clonidine. Continue to monitor.  MPN (myeloproliferative neoplasm) [facility]) Following with hematology/oncology Dr. Burr Medico.  On current therapy with anagrelide which we will hold for now due to potential GI side effect. Continue anagrelide.  Malnutrition of moderate degree Nutrition consult.  Anemia Anemia due to MDS.  Hemoglobin is 7.5 this morning. Monitor and transfuse for hemoglobin less than 7.0..  Asthma Stable without wheezing on admission.  Continue Symbicort and albuterol as needed. Resolved.   I have seen and examined this patient myself. I have spent 32 minutes in her evaluation and care.  DVT prophylaxis: Lovenox Code Status: Full Code Family Communication: Daughter at bedside Disposition Plan: Home    Lilou Kneip, DO [facility] Direct contact: see [email or web address]  7PM-7AM contact night coverage as above 01/18/2022, 3:50 PM  LOS: 2 days

## 2022-01-19 ENCOUNTER — Other Ambulatory Visit (HOSPITAL_COMMUNITY): Payer: Self-pay

## 2022-01-19 LAB — CBC WITH DIFFERENTIAL/PLATELET
Abs Immature Granulocytes: 0.02 10*3/uL (ref 0.00–0.07)
Basophils Absolute: 0 10*3/uL (ref 0.0–0.1)
Basophils Relative: 1 %
Eosinophils Absolute: 0.1 10*3/uL (ref 0.0–0.5)
Eosinophils Relative: 1 %
HCT: 22.5 % — ABNORMAL LOW (ref 36.0–46.0)
Hemoglobin: 7.6 g/dL — ABNORMAL LOW (ref 12.0–15.0)
Immature Granulocytes: 0 %
Lymphocytes Relative: 21 %
Lymphs Abs: 1.3 10*3/uL (ref 0.7–4.0)
MCH: 31 pg (ref 26.0–34.0)
MCHC: 33.8 g/dL (ref 30.0–36.0)
MCV: 91.8 fL (ref 80.0–100.0)
Monocytes Absolute: 0.7 10*3/uL (ref 0.1–1.0)
Monocytes Relative: 12 %
Neutro Abs: 4.3 10*3/uL (ref 1.7–7.7)
Neutrophils Relative %: 65 %
Platelets: 284 10*3/uL (ref 150–400)
RBC: 2.45 MIL/uL — ABNORMAL LOW (ref 3.87–5.11)
RDW: 11.4 % — ABNORMAL LOW (ref 11.5–15.5)
WBC: 6.5 10*3/uL (ref 4.0–10.5)
nRBC: 0 % (ref 0.0–0.2)

## 2022-01-19 LAB — BASIC METABOLIC PANEL
Anion gap: 6 (ref 5–15)
BUN: 43 mg/dL — ABNORMAL HIGH (ref 8–23)
CO2: 24 mmol/L (ref 22–32)
Calcium: 8.9 mg/dL (ref 8.9–10.3)
Chloride: 100 mmol/L (ref 98–111)
Creatinine, Ser: 0.59 mg/dL (ref 0.44–1.00)
GFR, Estimated: >60 mL/min (ref >60)
Glucose, Bld: 82 mg/dL (ref 70–99)
Potassium: 4.2 mmol/L (ref 3.5–5.1)
Sodium: 130 mmol/L — ABNORMAL LOW (ref 135–145)

## 2022-01-19 MED ORDER — UREA 15 G PO PACK
15.0000 g | PACK | Freq: Every day | ORAL | 0 refills | Status: DC
  Filled 2022-01-19: qty 24, 24d supply, fill #0

## 2022-01-19 MED ORDER — CEFPODOXIME PROXETIL 200 MG PO TABS
200.0000 mg | ORAL_TABLET | Freq: Two times a day (BID) | ORAL | 0 refills | Status: AC
Start: 2022-01-19 — End: 2022-01-23
  Filled 2022-01-19: qty 6, 3d supply, fill #0

## 2022-01-19 NOTE — TOC Transition Note (Signed)
Transition of Care [facility]) - CM/SW Discharge Note   Patient Details  Name: Paula Griffith MRN: [id number] Date of Birth: 02-01-1946  Transition of Care [facility]) CM/SW Contact:  Leeroy Cha, RN Phone Number: 01/19/2022, 12:00 PM   Clinical Narrative:     Transition of Care [facility]) Screening Note   Patient Details  Name: Paula Griffith Date of Birth: Feb 02, 1946   Transition of Care [facility]) CM/SW Contact:    Leeroy Cha, RN Phone Number: 01/19/2022, 12:00 PM    Transition of Care Department [facility]) has reviewed patient and no TOC needs have been identified at this time. We will continue to monitor patient advancement through interdisciplinary progression rounds. If new patient transition needs arise, please place a TOC consult.     Final next level of care: Home/Self Care Barriers to Discharge: No Barriers Identified   Patient Goals and CMS Choice Patient states their goals for this hospitalization and ongoing recovery are:: to go home CMS Medicare.gov Compare Post Acute Care list provided to:: Patient    Discharge Placement                       Discharge Plan and Services   Discharge Planning Services: CM Consult                                 Social Determinants of Health (SDOH) Interventions     Readmission Risk Interventions     No data to display

## 2022-01-19 NOTE — Plan of Care (Signed)
PIV removed. DC paperwork reviewed with pt and pt's daughter. Questions answered.   Problem: Education: Goal: Knowledge of General Education information will improve Description: Including pain rating scale, medication(s)/side effects and non-pharmacologic comfort measures Outcome: Completed/Met   Problem: Health Behavior/Discharge Planning: Goal: Ability to manage health-related needs will improve Outcome: Completed/Met   Problem: Clinical Measurements: Goal: Ability to maintain clinical measurements within normal limits will improve Outcome: Completed/Met Goal: Will remain free from infection Outcome: Completed/Met Goal: Diagnostic test results will improve Outcome: Completed/Met Goal: Respiratory complications will improve Outcome: Completed/Met Goal: Cardiovascular complication will be avoided Outcome: Completed/Met   Problem: Activity: Goal: Risk for activity intolerance will decrease Outcome: Completed/Met   Problem: Nutrition: Goal: Adequate nutrition will be maintained Outcome: Completed/Met   Problem: Coping: Goal: Level of anxiety will decrease Outcome: Completed/Met   Problem: Elimination: Goal: Will not experience complications related to bowel motility Outcome: Completed/Met Goal: Will not experience complications related to urinary retention Outcome: Completed/Met   Problem: Pain Managment: Goal: General experience of comfort will improve Outcome: Completed/Met   Problem: Safety: Goal: Ability to remain free from injury will improve Outcome: Completed/Met   Problem: Skin Integrity: Goal: Risk for impaired skin integrity will decrease Outcome: Completed/Met

## 2022-01-20 ENCOUNTER — Other Ambulatory Visit (HOSPITAL_COMMUNITY): Payer: Self-pay

## 2022-01-20 ENCOUNTER — Telehealth: Payer: Self-pay | Admitting: *Deleted

## 2022-01-20 ENCOUNTER — Encounter: Payer: Self-pay | Admitting: *Deleted

## 2022-01-20 LAB — CULTURE, BLOOD (SINGLE)

## 2022-01-20 NOTE — Patient Outreach (Addendum)
  Care Coordination [facility] Note Transition Care Management Follow-up Telephone Call Date of discharge and from where: 01/19/22- [facility] How have you been since you were released from the hospital? "I am doing great- much better than I was" Any questions or concerns? No  Items Reviewed: Did the pt receive and understand the discharge instructions provided? Yes  Medications obtained and verified? Yes  Other?  N/A Any new allergies since your discharge? No  Dietary orders reviewed? Yes Do you have support at home? Yes   Home Care and Equipment/Supplies: Were home health services ordered? no If so, what is the name of the agency? N/A  Has the agency set up a time to come to the patient's home? not applicable Were any new equipment or medical supplies ordered?  No What is the name of the medical supply agency? N/A Were you able to get the supplies/equipment? not applicable Do you have any questions related to the use of the equipment or supplies? No N/A  Functional Questionnaire: (I = Independent and D = Dependent) ADLs: I  Bathing/Dressing- I  Meal Prep- I: daughter assists as/ if indicated  Eating- I  Maintaining continence- I  Transferring/Ambulation- I  Managing Meds- I  Follow up appointments reviewed:  PCP Hospital f/u appt confirmed? No  Scheduled to see no one yet; reports daughter to schedule appointment this morning; declines need for assistance with scheduling   [facility] f/u appt confirmed? No  - no specialist needed Are transportation arrangements needed? No  daughter to transport If their condition worsens, is the pt aware to call PCP or go to the Emergency Dept.? Yes Was the patient provided with contact information for the PCP's office or ED? Yes Was to pt encouraged to call back with questions or concerns? Yes  SDOH assessments and interventions completed:   Yes  Care Coordination Interventions Activated:  Yes Care Coordination Interventions:    reviewed discharge instructions with patient and daughter; confirmed good understanding of same  Encounter Outcome:  Pt. Visit Completed  Oneta Rack, RN, BSN, [facility] RN Care Coordination- [place] [phone]: direct office

## 2022-01-29 ENCOUNTER — Ambulatory Visit: Payer: Medicare PPO | Admitting: Internal Medicine

## 2022-01-30 ENCOUNTER — Ambulatory Visit: Payer: Medicare PPO | Admitting: Internal Medicine

## 2022-01-30 ENCOUNTER — Encounter: Payer: Self-pay | Admitting: Internal Medicine

## 2022-01-30 VITALS — BP 140/70 | HR 85 | Temp 99.6°F | Ht 62.0 in | Wt 125.4 lb

## 2022-01-30 DIAGNOSIS — N10 Acute tubulo-interstitial nephritis: Secondary | ICD-10-CM

## 2022-01-30 DIAGNOSIS — D471 Chronic myeloproliferative disease: Secondary | ICD-10-CM

## 2022-01-30 DIAGNOSIS — E871 Hypo-osmolality and hyponatremia: Secondary | ICD-10-CM | POA: Diagnosis not present

## 2022-01-30 DIAGNOSIS — R112 Nausea with vomiting, unspecified: Secondary | ICD-10-CM | POA: Diagnosis not present

## 2022-01-30 DIAGNOSIS — R197 Diarrhea, unspecified: Secondary | ICD-10-CM

## 2022-01-30 DIAGNOSIS — G4730 Sleep apnea, unspecified: Secondary | ICD-10-CM | POA: Diagnosis not present

## 2022-01-30 LAB — BASIC METABOLIC PANEL
BUN: 55 mg/dL — ABNORMAL HIGH (ref 6–23)
CO2: 28 mEq/L (ref 19–32)
Calcium: 9.8 mg/dL (ref 8.4–10.5)
Chloride: 100 mEq/L (ref 96–112)
Creatinine, Ser: 0.97 mg/dL (ref 0.40–1.20)
GFR: 56.85 mL/min — ABNORMAL LOW (ref >60.00)
Glucose, Bld: 111 mg/dL — ABNORMAL HIGH (ref 70–99)
Potassium: 4.7 mEq/L (ref 3.5–5.1)
Sodium: 136 mEq/L (ref 135–145)

## 2022-01-30 LAB — CBC WITH DIFFERENTIAL/PLATELET
Basophils Absolute: 0.1 10*3/uL (ref 0.0–0.1)
Basophils Relative: 0.8 % (ref 0.0–3.0)
Eosinophils Absolute: 0 10*3/uL (ref 0.0–0.7)
Eosinophils Relative: 0.4 % (ref 0.0–5.0)
HCT: 25.3 % — ABNORMAL LOW (ref 36.0–46.0)
Hemoglobin: 8.5 g/dL — ABNORMAL LOW (ref 12.0–15.0)
Lymphocytes Relative: 23.5 % (ref 12.0–46.0)
Lymphs Abs: 1.5 10*3/uL (ref 0.7–4.0)
MCHC: 33.7 g/dL (ref 30.0–36.0)
MCV: 94.6 fl (ref 78.0–100.0)
Monocytes Absolute: 0.5 10*3/uL (ref 0.1–1.0)
Monocytes Relative: 7.2 % (ref 3.0–12.0)
Neutro Abs: 4.3 10*3/uL (ref 1.4–7.7)
Neutrophils Relative %: 68.1 % (ref 43.0–77.0)
Platelets: 197 10*3/uL (ref 150.0–400.0)
RBC: 2.67 Mil/uL — ABNORMAL LOW (ref 3.87–5.11)
RDW: 13.1 % (ref 11.5–15.5)
WBC: 6.4 10*3/uL (ref 4.0–10.5)

## 2022-01-30 NOTE — Patient Instructions (Signed)
You will be contacted regarding the referral for: pulmonary to check for sleep apnea  Please continue all other medications as before, and refills have been done if requested.  Please have the pharmacy call with any other refills you may need.  Please keep your appointments with your specialists as you may have planned  Please go to the LAB at the blood drawing area for the tests to be done  You will be contacted by phone if any changes need to be made immediately.  Otherwise, you will receive a letter about your results with an explanation, but please check with MyChart first.  Please remember to sign up for MyChart if you have not done so, as this will be important to you in the future with finding out test results, communicating by private email, and scheduling acute appointments online when needed.

## 2022-01-30 NOTE — Progress Notes (Signed)
Patient ID: Paula Griffith, female   DOB: 07-Dec-1945, 76 y.o.   MRN: [id number]        Chief Complaint: follow up post hospn 7/19 - 7/24 (note No dc summary on chart at time of this visit)       HPI:  Paula Griffith is a 76 y.o. female here with hx of myeloproliferative d/o and recent severe hyponatremia hospn 7/19 - 7/24 after 2 wks osnet severe n/v and decreased po intake, diarrhea.  Na 116 per PCP with admit and tx with IVFs as well as initial rocephin IV for possible UTI /abnormal UA  By time of d/c Na resumed to 130, urine culture abnormal for e coli and klebsiella.  Mild AKI and low K also resolved.  No further n/v/d.  No overt bleeding, d/c Hgb is 7.6.  No clinical need for transfusion needed.  Pt now has been home since 7/24 with duaghter for support who reports pt with hx of memory changes as well.   Pt denies chest pain, increased sob or doe, wheezing, orthopnea, PND, increased LE swelling, palpitations, dizziness or syncope.  Denies worsening reflux, abd pain, dysphagia, n/v, bowel change or blood.   Pt denies fever, wt loss, night sweats, loss of appetite, or other constitutional symptoms  Denies urinary symptoms such as dysuria, frequency, urgency, flank pain, hematuria or n/v, fever, chills. Incidentally pt does have snoring and stops breathing at times in the night.  Transitional Care Management elements noted today: 1)  Date of D/C: as above 2)  Medication reconciliation:  done today at end visit 3)  Review of D/C summary or other information:  none to review on chart 4)  Review of need for f/u on pending diagnostic tests and treatments:  done today - none 5)  Review of need for Interaction with other providers who will assume or resume care of pt specific problems: done today - none needed 6)  Education of patient/family/guardian or caregiver: daughter with her well versed, none needed  Wt Readings from Last 3 Encounters:  01/30/22 125 lb 6.4 oz (56.9 kg)  01/18/22 124 lb 5.4 oz  (56.4 kg)  01/14/22 120 lb (54.4 kg)   BP Readings from Last 3 Encounters:  01/30/22 (!) 140/70  01/19/22 135/60  01/14/22 (!) 120/50         Past Medical History:  Diagnosis Date   Allergic rhinitis    Asthma    Cervicalgia    Colon polyps    Complication of anesthesia    Diverticulosis of colon    Glucose intolerance (impaired glucose tolerance)    HTN (hypertension)    LBP (low back pain)    Leukemia (HCC)    Osteoarthritis    PONV (postoperative nausea and vomiting)    PVD (peripheral vascular disease) ([place]) 2011   Right Carotid Dr Scot Dock   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   Spondylolisthesis of lumbar region    Thrombocytopathy [facility]) 2011   Dr Ralene Ok   Past Surgical History:  Procedure Laterality Date   [place] DECOMP/DISCECTOMY FUSION N/A 09/22/2017   Procedure: ANTERIOR CERVICAL DECOMPRESSION AND FUSION CERVICAL THREE-FOUR.;  Surgeon: Eustace Moore, MD;  Location: [facility];  Service: Neurosurgery;  Laterality: N/A;  anterior   BACK SURGERY     BREAST EXCISIONAL BIOPSY Left    BREAST SURGERY     Left   LUMBAR LAMINECTOMY/DECOMPRESSION MICRODISCECTOMY Left 01/06/2018   Procedure: laminectomy Lumbar five -Sacral  one - left, Lumbar two-Lumbar three - bilateral;  Surgeon: Eustace Moore, MD;  Location: [facility];  Service: Neurosurgery;  Laterality: Left;    reports that she has never smoked. She has never used smokeless tobacco. She reports that she does not drink alcohol and does not use drugs. family history includes Arthritis in her mother; Breast cancer in her maternal grandmother; Coronary artery disease in an other family member; Diabetes in her sister; Early death (age of onset: 49) in her father; Lung cancer in an other family member; Pancreatic cancer in her mother. Allergies  Allergen Reactions   Benazepril Cough    cough   Amlodipine     Leg edema at 5 mg/d   Fluad [Influenza Vac A&B Surf Ant Adj]     Very sick    Other     BAND-AID CAUSE SKIN TEARS/BRUISES   Codeine Nausea Only   Tramadol Other (See Comments)    Dizzy, nauseated   Current Outpatient Medications on File Prior to Visit  Medication Sig Dispense Refill   albuterol (PROAIR HFA) 108 (90 Base) MCG/ACT inhaler Inhale 2 puffs into the lungs every 4 (four) hours as needed. For shortness of breath 18 g 5   anagrelide (AGRYLIN) 1 MG capsule TAKE 1 CAPSULE BY MOUTH DAILY EXCEPT 2 DAYS A WEEK (TUESDAYS AND THURSDAYS) DO NOT TAKE. (Patient taking differently: Take 1 mg by mouth See admin instructions. TAKE 1 CAPSULE BY MOUTH DAILY EXCEPT 2 DAYS A WEEK (TUESDAYS AND THURSDAYS) DO NOT TAKE.) 66 capsule 1   Ascorbic Acid (VITAMIN C) 1000 MG tablet Take 1,000 mg by mouth daily.     aspirin 325 MG EC tablet Take 325 mg by mouth daily after breakfast.     carvedilol (COREG) 25 MG tablet Take 1 tablet (25 mg total) by mouth 2 (two) times daily. (Patient taking differently: Take 50 mg by mouth daily.) 60 tablet 11   Cholecalciferol (VITAMIN D3) 2000 units TABS Take 2,000 Units by mouth daily.     gabapentin (NEURONTIN) 100 MG capsule TAKE 1 CAPSULE BY MOUTH THREE TIMES A DAY (Patient taking differently: Take 100 mg by mouth 3 (three) times daily.) 270 capsule 1   Multiple Vitamins-Minerals (ADULT GUMMY PO) Take 2 tablets by mouth daily.     trolamine salicylate (ASPERCREME) 10 % cream Apply 1 application topically 2 (two) times daily as needed for muscle pain.     urea (URE-NA) 15 g PACK oral packet Take 15 g (one packet) by mouth daily. 30 packet 0   amLODipine (NORVASC) 5 MG tablet TAKE 1 TABLET BY MOUTH EVERY DAY (Patient not taking: Reported on 01/14/2022) 90 tablet 3   cloNIDine (CATAPRES - DOSED IN MG/24 HR) 0.1 mg/24hr patch Place 1 patch (0.1 mg total) onto the skin once a week. (Patient not taking: Reported on 01/14/2022) 4 patch 12   Fluticasone-Salmeterol (ADVAIR) 250-50 MCG/DOSE AEPB Please specify directions, refills and quantity (Patient not taking:  Reported on 01/14/2022) 1 each 11   [DISCONTINUED] losartan-hydrochlorothiazide (HYZAAR) 100-25 MG tablet Take 1 tablet by mouth daily. 90 tablet 3   No current facility-administered medications on file prior to visit.        ROS:  All others reviewed and negative.  Objective        PE:  BP (!) 140/70 (BP Location: Right Arm, Patient Position: Sitting, Cuff Size: Normal)   Pulse 85   Temp 99.6 F (37.6 C) (Oral)   Ht '5\' 2"'$  (1.575 m)  Wt 125 lb 6.4 oz (56.9 kg)   SpO2 99%   BMI 22.94 kg/m                 Constitutional: Pt appears in NAD               HENT: Head: NCAT.                Right Ear: External ear normal.                 Left Ear: External ear normal.                Eyes: . Pupils are equal, round, and reactive to light. Conjunctivae and EOM are normal               Nose: without d/c or deformity               Neck: Neck supple. Gross normal ROM               Cardiovascular: Normal rate and regular rhythm.                 Pulmonary/Chest: Effort normal and breath sounds without rales or wheezing.                Abd:  Soft, NT, ND, + BS, no organomegaly               Neurological: Pt is alert. At baseline orientation, motor grossly intact               Skin: Skin is warm. No rashes, no other new lesions, LE edema - none               Psychiatric: Pt behavior is normal without agitation   Micro: none  Cardiac tracings I have personally interpreted today:  none  Pertinent Radiological findings (summarize): none   Lab Results  Component Value Date   WBC 6.4 01/30/2022   HGB 8.5 Repeated and verified X2. (L) 01/30/2022   HCT 25.3 Repeated and verified X2. (L) 01/30/2022   PLT 197.0 01/30/2022   GLUCOSE 111 (H) 01/30/2022   CHOL 158 03/01/2018   TRIG 84.0 03/01/2018   HDL 47.10 03/01/2018   LDLCALC 94 03/01/2018   ALT 11 01/14/2022   AST 22 01/14/2022   NA 136 01/30/2022   K 4.7 01/30/2022   CL 100 01/30/2022   CREATININE 0.97 01/30/2022   BUN 55 (H)  01/30/2022   CO2 28 01/30/2022   TSH 0.93 10/14/2021   INR 0.97 12/29/2017   HGBA1C 5.4 04/28/2021   Assessment/Plan:  FATOU DUNNIGAN is a 76 y.o. Black or African American [2] female with  has a past medical history of Allergic rhinitis, Asthma, Cervicalgia, Colon polyps, Complication of anesthesia, Diverticulosis of colon, Glucose intolerance (impaired glucose tolerance), HTN (hypertension), LBP (low back pain), Leukemia ([place]), Osteoarthritis, PONV (postoperative nausea and vomiting), PVD (peripheral vascular disease) ([place]) (2011), Retinal infarct (2011), Spondylolisthesis of lumbar region, and Thrombocytopathy ([place]) (2011).  Nausea vomiting and diarrhea Resolved, continue to follow  Acute on chronic hyponatremia Also for f/u bmp today  UTI (urinary tract infection) Clinically resolved,  to f/u any worsening symptoms or concerns  MPN (myeloproliferative neoplasm) (HCC) Also for f/u cbc today but likely low due to hydration  Sleep-disordered breathing High suspicion for OSA by hx - for pulm referral  Followup: Return if symptoms worsen or fail to improve.  Cathlean Cower, MD 02/01/2022 7:09  PM [facility]

## 2022-02-01 ENCOUNTER — Encounter: Payer: Self-pay | Admitting: Internal Medicine

## 2022-02-01 DIAGNOSIS — G4730 Sleep apnea, unspecified: Secondary | ICD-10-CM | POA: Insufficient documentation

## 2022-02-01 NOTE — Assessment & Plan Note (Signed)
Also for f/u bmp today

## 2022-02-01 NOTE — Assessment & Plan Note (Signed)
Clinically resolved,  to f/u any worsening symptoms or concerns 

## 2022-02-01 NOTE — Assessment & Plan Note (Signed)
High suspicion for OSA by hx - for pulm referral

## 2022-02-01 NOTE — Assessment & Plan Note (Signed)
Also for f/u cbc today but likely low due to hydration

## 2022-02-01 NOTE — Assessment & Plan Note (Signed)
Resolved, continue to follow

## 2022-02-03 ENCOUNTER — Encounter: Payer: Self-pay | Admitting: Internal Medicine

## 2022-02-03 ENCOUNTER — Ambulatory Visit: Payer: Medicare PPO | Admitting: Internal Medicine

## 2022-02-03 VITALS — BP 158/58 | HR 77 | Temp 98.6°F | Resp 16 | Ht 62.0 in | Wt 125.5 lb

## 2022-02-03 DIAGNOSIS — B354 Tinea corporis: Secondary | ICD-10-CM | POA: Diagnosis not present

## 2022-02-03 DIAGNOSIS — I10 Essential (primary) hypertension: Secondary | ICD-10-CM | POA: Diagnosis not present

## 2022-02-03 DIAGNOSIS — L03316 Cellulitis of umbilicus: Secondary | ICD-10-CM

## 2022-02-03 MED ORDER — KETOCONAZOLE 2 % EX CREA: 1.0000 | TOPICAL_CREAM | Freq: Two times a day (BID) | CUTANEOUS | 1 refills | Status: DC

## 2022-02-03 NOTE — Progress Notes (Signed)
Subjective:  Patient ID: Paula Griffith, female    DOB: Dec 30, 1945  Age: 76 y.o. MRN: [id number]  CC: Rash   HPI Paula Griffith presents for f/up -  She complains of a 1 week history of red sore rash around her bellybutton.  Outpatient Medications Prior to Visit  Medication Sig Dispense Refill   albuterol (PROAIR HFA) 108 (90 Base) MCG/ACT inhaler Inhale 2 puffs into the lungs every 4 (four) hours as needed. For shortness of breath 18 g 5   amLODipine (NORVASC) 5 MG tablet TAKE 1 TABLET BY MOUTH EVERY DAY 90 tablet 3   anagrelide (AGRYLIN) 1 MG capsule TAKE 1 CAPSULE BY MOUTH DAILY EXCEPT 2 DAYS A WEEK (TUESDAYS AND THURSDAYS) DO NOT TAKE. (Patient taking differently: Take 1 mg by mouth See admin instructions. TAKE 1 CAPSULE BY MOUTH DAILY EXCEPT 2 DAYS A WEEK (TUESDAYS AND THURSDAYS) DO NOT TAKE.) 66 capsule 1   Ascorbic Acid (VITAMIN C) 1000 MG tablet Take 1,000 mg by mouth daily.     aspirin 325 MG EC tablet Take 325 mg by mouth daily after breakfast.     carvedilol (COREG) 25 MG tablet Take 1 tablet (25 mg total) by mouth 2 (two) times daily. (Patient taking differently: Take 50 mg by mouth daily.) 60 tablet 11   Cholecalciferol (VITAMIN D3) 2000 units TABS Take 2,000 Units by mouth daily.     cloNIDine (CATAPRES - DOSED IN MG/24 HR) 0.1 mg/24hr patch Place 1 patch (0.1 mg total) onto the skin once a week. 4 patch 12   Fluticasone-Salmeterol (ADVAIR) 250-50 MCG/DOSE AEPB Please specify directions, refills and quantity 1 each 11   gabapentin (NEURONTIN) 100 MG capsule TAKE 1 CAPSULE BY MOUTH THREE TIMES A DAY (Patient taking differently: Take 100 mg by mouth 3 (three) times daily.) 270 capsule 1   Multiple Vitamins-Minerals (ADULT GUMMY PO) Take 2 tablets by mouth daily.     trolamine salicylate (ASPERCREME) 10 % cream Apply 1 application topically 2 (two) times daily as needed for muscle pain.     urea (URE-NA) 15 g PACK oral packet Take 15 g (one packet) by mouth daily. 30 packet 0    No facility-administered medications prior to visit.    ROS Review of Systems  Constitutional: Negative.  Negative for diaphoresis and fatigue.  HENT: Negative.    Eyes: Negative.   Respiratory:  Negative for cough and shortness of breath.   Cardiovascular:  Negative for chest pain, palpitations and leg swelling.  Gastrointestinal:  Negative for abdominal pain, diarrhea, nausea and vomiting.  Endocrine: Negative.   Genitourinary: Negative.  Negative for difficulty urinating.  Musculoskeletal: Negative.   Skin:  Positive for color change. Negative for rash.  Neurological: Negative.  Negative for dizziness and weakness.  Hematological:  Negative for adenopathy. Does not bruise/bleed easily.  Psychiatric/Behavioral: Negative.      Objective:  BP (!) 158/58 (BP Location: Right Arm, Patient Position: Sitting, Cuff Size: Small)   Pulse 77   Temp 98.6 F (37 C) (Oral)   Resp 16   Ht '5\' 2"'$  (1.575 m)   Wt 125 lb 8 oz (56.9 kg)   SpO2 96%   BMI 22.95 kg/m   BP Readings from Last 3 Encounters:  02/03/22 (!) 158/58  01/30/22 (!) 140/70  01/19/22 135/60    Wt Readings from Last 3 Encounters:  02/03/22 125 lb 8 oz (56.9 kg)  01/30/22 125 lb 6.4 oz (56.9 kg)  01/18/22 124 lb 5.4 oz (  56.4 kg)    Physical Exam Vitals reviewed.  HENT:     Mouth/Throat:     Mouth: Mucous membranes are moist.  Eyes:     General: No scleral icterus.    Conjunctiva/sclera: Conjunctivae normal.  Cardiovascular:     Rate and Rhythm: Normal rate and regular rhythm.     Heart sounds: No murmur heard. Pulmonary:     Breath sounds: No stridor. No wheezing, rhonchi or rales.  Abdominal:     General: Abdomen is flat.     Palpations: There is no mass.     Tenderness: There is no abdominal tenderness. There is no guarding.     Hernia: No hernia is present.  Musculoskeletal:        General: Normal range of motion.     Cervical back: Neck supple.     Right lower leg: No edema.     Left lower leg:  No edema.  Lymphadenopathy:     Cervical: No cervical adenopathy.  Skin:    Findings: Erythema and rash present.     Comments: Erythema around the umbilicus.  Neurological:     General: No focal deficit present.     Mental Status: Mental status is at baseline.  Psychiatric:        Mood and Affect: Mood normal.        Behavior: Behavior normal.     Lab Results  Component Value Date   WBC 6.4 01/30/2022   HGB 8.5 Repeated and verified X2. (L) 01/30/2022   HCT 25.3 Repeated and verified X2. (L) 01/30/2022   PLT 197.0 01/30/2022   GLUCOSE 111 (H) 01/30/2022   CHOL 158 03/01/2018   TRIG 84.0 03/01/2018   HDL 47.10 03/01/2018   LDLCALC 94 03/01/2018   ALT 11 01/14/2022   AST 22 01/14/2022   NA 136 01/30/2022   K 4.7 01/30/2022   CL 100 01/30/2022   CREATININE 0.97 01/30/2022   BUN 55 (H) 01/30/2022   CO2 28 01/30/2022   TSH 0.93 10/14/2021   INR 0.97 12/29/2017   HGBA1C 5.4 04/28/2021    DG Chest 2 View  Result Date: 01/15/2022 CLINICAL DATA:  Confusion. EXAM: CHEST - 2 VIEW COMPARISON:  Chest radiograph dated 01/06/2018. FINDINGS: No focal consolidation, pleural effusion, pneumothorax. The cardiac silhouette is within normal limits. No acute osseous pathology. IMPRESSION: No active cardiopulmonary disease. Electronically Signed   By: Anner Crete M.D.   On: 01/15/2022 00:56   Component 3 d ago  Source: WOUND (SITE NOT SPECIFIED)   Status: FINAL   Gram Stain: No white blood cells seen No epithelial cells seen Many Gram negative bacilli   RESULT: A mix of organisms of questionable significance was recovered on culture and not further identified. (Note: Growth did not detect the presence of S.aureus, beta-hemolytic Streptococci or P.aeruginosa).    Assessment & Plan:   Hadyn was seen today for rash.  Diagnoses and all orders for this visit:  Tinea corporis -     ketoconazole (NIZORAL) 2 % cream; Apply 1 Application topically 2 (two) times daily.  Cellulitis of  umbilicus-the culture is negative. -     WOUND CULTURE; Future -     WOUND CULTURE  Essential hypertension-her blood pressure is adequately well-controlled.   I am having [name] start on ketoconazole. I am also having her maintain her aspirin EC, vitamin C, Multiple Vitamins-Minerals (ADULT GUMMY PO), Vitamin D3, trolamine salicylate, albuterol, Fluticasone-Salmeterol, gabapentin, cloNIDine, amLODipine, carvedilol, anagrelide, and urea.  Meds ordered this encounter  Medications   ketoconazole (NIZORAL) 2 % cream    Sig: Apply 1 Application topically 2 (two) times daily.    Dispense:  60 g    Refill:  1     Follow-up: Return in about 3 months (around 05/06/2022).  Scarlette Calico, MD

## 2022-02-03 NOTE — Patient Instructions (Signed)
Body Ringworm Body ringworm is an infection of the skin that often causes a ring-shaped rash. Body ringworm is also called tinea corporis. Body ringworm can affect any part of your skin. This condition is easily spread from person to person (is very contagious). What are the causes? This condition is caused by fungi called dermatophytes. The condition develops when these fungi grow out of control on the skin. You can get this condition if you touch a person or animal that has it. You can also get it if you share any items with an infected person or pet. These include: Clothing, bedding, and towels. Brushes or combs. Gym equipment. Any other object that has the fungus on it. What increases the risk? You are more likely to develop this condition if you: Play sports that involve close physical contact, such as wrestling. Sweat a lot. Live in areas that are hot and humid. Use public showers. Have a weakened immune system. What are the signs or symptoms? Symptoms of this condition include: Itchy, raised red spots and bumps. Red scaly patches. A ring-shaped rash. The rash may have: A clear center. Scales or red bumps at its center. Redness near its borders. Dry and scaly skin on or around it. How is this diagnosed? This condition can usually be diagnosed with a skin exam. A skin scraping may be taken from the affected area and examined under a microscope to see if the fungus is present. How is this treated? This condition may be treated with: An antifungal cream or ointment. An antifungal shampoo. Antifungal medicines. These may be prescribed if your ringworm: Is severe. Keeps coming back. Lasts a long time. Follow these instructions at home: Take over-the-counter and prescription medicines only as told by your health care provider. If you were given an antifungal cream or ointment: Use it as told by your health care provider. Wash the infected area and dry it completely before  applying the cream or ointment. If you were given an antifungal shampoo: Use it as told by your health care provider. Leave the shampoo on your body for 3-5 minutes before rinsing. While you have a rash: Wear loose clothing to stop clothes from rubbing and irritating it. Wash or change your bed sheets every night. Disinfect or throw out items that may be infected. Wash clothes and bed sheets in hot water. Wash your hands often with soap and water. If soap and water are not available, use hand sanitizer. If your pet has the same infection, take your pet to see a veterinarian for treatment. How is this prevented? Take a bath or shower every day and after every time you work out or play sports. Dry your skin completely after bathing. Wear sandals or shoes in public places and showers. Change your clothes every day. Wash athletic clothes after each use. Do not share personal items with others. Avoid touching red patches of skin on other people. Avoid touching pets that have bald spots. If you touch an animal that has a bald spot, wash your hands. Contact a health care provider if: Your rash continues to spread after 7 days of treatment. Your rash is not gone in 4 weeks. The area around your rash gets red, warm, tender, and swollen. Summary Body ringworm is an infection of the skin that often causes a ring-shaped rash. This condition is easily spread from person to person (is very contagious). This condition may be treated with antifungal cream or ointment, antifungal shampoo, or antifungal medicines. Take over-the-counter and   prescription medicines only as told by your health care provider. This information is not intended to replace advice given to you by your health care provider. Make sure you discuss any questions you have with your health care provider. Document Revised: 08/27/2021 Document Reviewed: 04/08/2021 Elsevier Patient Education  [place].

## 2022-02-06 LAB — WOUND CULTURE

## 2022-02-15 NOTE — Discharge Summary (Signed)
Physician Discharge Summary   Patient: [name] MRN: [id number] DOB: May 05, 1946  Admit date:     01/14/2022  Discharge date: 01/19/2022  Discharge Physician: Karie Kirks   PCP: Cassandria Anger, MD   Recommendations at discharge:    Discharge to home Follow up with PCP in 7-10 days. Chemistry to be drawn on that visit to be reported to PCP.  Discharge Diagnoses: Principal Problem:   Acute on chronic hyponatremia Active Problems:   Bacteriuria   Nausea vomiting and diarrhea   Hypokalemia   AKI (acute kidney injury) ([place])   Essential hypertension   MPN (myeloproliferative neoplasm) (HCC)   Asthma   Anemia   Malnutrition of moderate degree  Resolved Problems:   * No resolved hospital problems. *  Hospital Course: Paula Griffith is a 76 y.o. female with medical history significant for myeloproliferative neoplasm with essential thrombocythemia and anemia, hypertension, asthma, chronic hyponatremia who presented to the ED for evaluation of worsening hyponatremia.  Patient reported almost 2 weeks of nausea, vomiting, diarrhea.  She has not had any associated abdominal pain.  She has been feeling lethargic otherwise states that her GI symptoms have been improving and wants to go home.  She was seen by her PCP earlier today (7/19) at which time's labs were obtained.  She was found to have worsening hyponatremia with sodium 116 and she was called to come to the ED for further evaluation.  She denies any chest pain or dyspnea.  She denies any dysuria.  She denies any peripheral edema.  She has not been able to keep some of her medications down due to to her nausea and vomiting.   ED Course  Labs/Imaging on admission: I have personally reviewed following labs and imaging studies.   Initial vitals showed BP 134/73, pulse 69, RR 17, temp 97.4 F, SPO2 100% on room air.   Labs show sodium 119, potassium 3.2, chloride 83, bicarb 22, BUN 19, creatinine 1.26 (baseline 0.8-0.9),  serum glucose 121, LFTs within normal limits, lipase 58, WBC 5.8, hemoglobin 9.8, platelets 392,000.   Urinalysis shows negative nitrates, large leukocytes, 11-20 RBC/hpf, >50 WBC/hpf, many bacteria on microscopy.   Patient was given 1 L normal saline, oral K 40 mEq, 1 g IV ceftriaxone.  The hospitalist service was consulted to admit for further evaluation and management.   This morning the patient's sodium had improved from 119 on admission to 127. ON 01/18/2022 it had dropped to 125. She was placed on urea. On the morning of 01/19/2022 the patient's sodium was 130.  She was discharged to home in fair condition.  Assessment and Plan: * Acute on chronic hyponatremia Patient has a history of chronic hyponatremia. She presented to ED with a sodium of 119. This improved to 125 by the following morning. On 01/16/2022 the patient's sodium was 127. It then dropped to 125 again on 01/17/2022. She was started on urea. This morning it is back up to 127. Continue to monitor.  Bacteriuria Urinalysis concerning for UTI however patient denies any urinary symptoms. -S/p IV ceftriaxone in ED, hold further antibiotics for now -Urine culture has grown out E. Coli and Klebsiella pneumoniae. Sensitivities have returned the patient will be changed to Rocephin.  AKI (acute kidney injury) ([place]) Mild with creatinine 1.26 on admission compared to baseline 0.8-0.9.  Likely prerenal from hypovolemia due to GI losses. -Creatinine is improved to 0.70. Monitor.  Hypokalemia Secondary to GI losses.  Supplemented. Resolved. Monitor. Magnesium 1.7. Will replace.  Nausea vomiting and diarrhea Resolved. Likely due to viral gastroenteritis.  Essential hypertension The patient is normotensive on carvedilol and clonidine. Continue to monitor.  MPN (myeloproliferative neoplasm) [facility]) Following with hematology/oncology Dr. Burr Medico.  On current therapy with anagrelide which we will hold for now due to potential GI side effect.  Continue anagrelide.  Malnutrition of moderate degree Nutrition consult.  Anemia Anemia due to MDS.  Hemoglobin is 7.5 this morning. Monitor and transfuse for hemoglobin less than 7.0..  Asthma Stable without wheezing on admission.  Continue Symbicort and albuterol as needed. Resolved.         Consultants: None Procedures performed: None  Disposition: Home Diet recommendation:  Discharge Diet Orders (From admission, onward)     Start     Ordered   01/19/22 0000  Diet general        01/19/22 1124           Regular diet DISCHARGE MEDICATION: Allergies as of 01/19/2022       Reactions   Benazepril Cough   cough   Amlodipine    Leg edema at 5 mg/d   Fluad [influenza Vac A&b Surf Ant Adj]    Very sick   Other    BAND-AID CAUSE SKIN TEARS/BRUISES   Codeine Nausea Only   Tramadol Other (See Comments)   Dizzy, nauseated        Medication List     STOP taking these medications    ciprofloxacin 500 MG tablet Commonly known as: Cipro   cloNIDine 0.1 MG tablet Commonly known as: CATAPRES   diphenoxylate-atropine 2.5-0.025 MG tablet Commonly known as: Lomotil   ondansetron 4 MG tablet Commonly known as: Zofran   potassium chloride 10 MEQ tablet Commonly known as: KLOR-CON M   valsartan-hydrochlorothiazide 320-25 MG tablet Commonly known as: DIOVAN-HCT       TAKE these medications    ADULT GUMMY PO Take 2 tablets by mouth daily.   albuterol 108 (90 Base) MCG/ACT inhaler Commonly known as: ProAir HFA Inhale 2 puffs into the lungs every 4 (four) hours as needed. For shortness of breath   amLODipine 5 MG tablet Commonly known as: NORVASC TAKE 1 TABLET BY MOUTH EVERY DAY   anagrelide 1 MG capsule Commonly known as: AGRYLIN TAKE 1 CAPSULE BY MOUTH DAILY EXCEPT 2 DAYS A WEEK (TUESDAYS AND THURSDAYS) DO NOT TAKE. What changed: See the new instructions.   aspirin EC 325 MG tablet Take 325 mg by mouth daily after breakfast.   carvedilol 25 MG  tablet Commonly known as: Coreg Take 1 tablet (25 mg total) by mouth 2 (two) times daily. What changed:  how much to take when to take this   cloNIDine 0.1 mg/24hr patch Commonly known as: CATAPRES - Dosed in mg/24 hr Place 1 patch (0.1 mg total) onto the skin once a week.   Fluticasone-Salmeterol 250-50 MCG/DOSE Aepb Commonly known as: ADVAIR Please specify directions, refills and quantity   gabapentin 100 MG capsule Commonly known as: NEURONTIN TAKE 1 CAPSULE BY MOUTH THREE TIMES A DAY What changed: See the new instructions.   trolamine salicylate 10 % cream Commonly known as: ASPERCREME Apply 1 application topically 2 (two) times daily as needed for muscle pain.   Ure-Na 15 g Pack oral packet Generic drug: urea Take 15 g (one packet) by mouth daily.   vitamin C 1000 MG tablet Take 1,000 mg by mouth daily.   Vitamin D3 50 MCG (2000 UT) Tabs Take 2,000 Units by mouth daily.  ASK your doctor about these medications    cefpodoxime 200 MG tablet Commonly known as: VANTIN Take 1 tablet by mouth 2 times daily for 3 days. Ask about: Should I take this medication?        Discharge Exam: Filed Weights   01/14/22 1750 01/17/22 0500 01/18/22 0500  Weight: 54.4 kg 56.3 kg 56.4 kg   Exam:  Constitutional:  The patient is awake, alert, and oriented x 3. No acute distress. Respiratory:  No increased work of breathing. No wheezes, rales, or rhonchi No tactile fremitus Cardiovascular:  Regular rate and rhythm No murmurs, ectopy, or gallups. No lateral PMI. No thrills. Abdomen:  Abdomen is soft, non-tender, non-distended No hernias, masses, or organomegaly Normoactive bowel sounds.  Musculoskeletal:  No cyanosis, clubbing, or edema Skin:  No rashes, lesions, ulcers palpation of skin: no induration or nodules Neurologic:  CN 2-12 intact Sensation all 4 extremities intact Psychiatric:  Mental status Mood, affect appropriate Orientation to person,  place, time  judgment and insight appear intact   Condition at discharge: fair  The results of significant diagnostics from this hospitalization (including imaging, microbiology, ancillary and laboratory) are listed below for reference.   Imaging Studies: No results found.  Microbiology: Results for orders placed or performed during the hospital encounter of 01/14/22  Urine Culture     Status: Abnormal   Collection Time: 01/14/22  8:55 PM   Specimen: Urine, Clean Catch  Result Value Ref Range Status   Specimen Description   Final    URINE, CLEAN CATCH Performed at [facility], [place] [address], [place], [place] [address]    Special Requests   Final    NONE Performed at [facility], [place] [address], [place], [place] [address]    Culture (A)  Final    >=100,000 COLONIES/mL ESCHERICHIA COLI >=100,000 COLONIES/mL KLEBSIELLA PNEUMONIAE    Report Status 01/17/2022 FINAL  Final   Organism ID, Bacteria ESCHERICHIA COLI (A)  Final   Organism ID, Bacteria KLEBSIELLA PNEUMONIAE (A)  Final      Susceptibility   Escherichia coli - MIC*    AMPICILLIN >=32 RESISTANT Resistant     CEFAZOLIN <=4 SENSITIVE Sensitive     CEFEPIME <=0.12 SENSITIVE Sensitive     CEFTRIAXONE <=0.25 SENSITIVE Sensitive     CIPROFLOXACIN <=0.25 SENSITIVE Sensitive     GENTAMICIN >=16 RESISTANT Resistant     IMIPENEM <=0.25 SENSITIVE Sensitive     NITROFURANTOIN <=16 SENSITIVE Sensitive     TRIMETH/SULFA >=320 RESISTANT Resistant     AMPICILLIN/SULBACTAM >=32 RESISTANT Resistant     PIP/TAZO <=4 SENSITIVE Sensitive     * >=100,000 COLONIES/mL ESCHERICHIA COLI   Klebsiella pneumoniae - MIC*    AMPICILLIN >=32 RESISTANT Resistant     CEFAZOLIN <=4 SENSITIVE Sensitive     CEFEPIME <=0.12 SENSITIVE Sensitive     CEFTRIAXONE <=0.25 SENSITIVE Sensitive     CIPROFLOXACIN <=0.25 SENSITIVE Sensitive     GENTAMICIN <=1 SENSITIVE Sensitive     IMIPENEM <=0.25 SENSITIVE Sensitive      NITROFURANTOIN 64 INTERMEDIATE Intermediate     TRIMETH/SULFA <=20 SENSITIVE Sensitive     AMPICILLIN/SULBACTAM <=2 SENSITIVE Sensitive     PIP/TAZO <=4 SENSITIVE Sensitive     * >=100,000 COLONIES/mL KLEBSIELLA PNEUMONIAE    Labs: CBC: No results for input(s): "WBC", "NEUTROABS", "HGB", "HCT", "MCV", "PLT" in the last 168 hours. Basic Metabolic Panel: No results for input(s): "NA", "K", "CL", "CO2", "GLUCOSE", "BUN", "CREATININE", "CALCIUM", "MG", "PHOS" in the  last 168 hours. Liver Function Tests: No results for input(s): "AST", "ALT", "ALKPHOS", "BILITOT", "PROT", "ALBUMIN" in the last 168 hours. CBG: No results for input(s): "GLUCAP" in the last 168 hours.  Discharge time spent: greater than 30 minutes.  Signed: Karie Kirks, DO [facility] 02/15/2022

## 2022-02-19 ENCOUNTER — Other Ambulatory Visit: Payer: Self-pay

## 2022-02-19 DIAGNOSIS — D471 Chronic myeloproliferative disease: Secondary | ICD-10-CM

## 2022-02-19 DIAGNOSIS — D649 Anemia, unspecified: Secondary | ICD-10-CM

## 2022-02-20 ENCOUNTER — Other Ambulatory Visit: Payer: Self-pay

## 2022-02-20 ENCOUNTER — Inpatient Hospital Stay: Payer: Medicare PPO | Attending: Hematology

## 2022-02-20 DIAGNOSIS — D471 Chronic myeloproliferative disease: Secondary | ICD-10-CM | POA: Insufficient documentation

## 2022-02-20 DIAGNOSIS — D649 Anemia, unspecified: Secondary | ICD-10-CM

## 2022-02-20 LAB — CBC WITH DIFFERENTIAL/PLATELET
Abs Immature Granulocytes: 0.02 10*3/uL (ref 0.00–0.07)
Basophils Absolute: 0 10*3/uL (ref 0.0–0.1)
Basophils Relative: 0 %
Eosinophils Absolute: 0 10*3/uL (ref 0.0–0.5)
Eosinophils Relative: 1 %
HCT: 24.9 % — ABNORMAL LOW (ref 36.0–46.0)
Hemoglobin: 8.6 g/dL — ABNORMAL LOW (ref 12.0–15.0)
Immature Granulocytes: 0 %
Lymphocytes Relative: 23 %
Lymphs Abs: 1.1 10*3/uL (ref 0.7–4.0)
MCH: 32 pg (ref 26.0–34.0)
MCHC: 34.5 g/dL (ref 30.0–36.0)
MCV: 92.6 fL (ref 80.0–100.0)
Monocytes Absolute: 0.5 10*3/uL (ref 0.1–1.0)
Monocytes Relative: 9 %
Neutro Abs: 3.2 10*3/uL (ref 1.7–7.7)
Neutrophils Relative %: 67 %
Platelets: 309 10*3/uL (ref 150–400)
RBC: 2.69 MIL/uL — ABNORMAL LOW (ref 3.87–5.11)
RDW: 13.2 % (ref 11.5–15.5)
WBC: 4.8 10*3/uL (ref 4.0–10.5)
nRBC: 0 % (ref 0.0–0.2)

## 2022-02-20 LAB — RETIC PANEL
Immature Retic Fract: 13.6 % (ref 2.3–15.9)
RBC.: 2.72 MIL/uL — ABNORMAL LOW (ref 3.87–5.11)
Retic Count, Absolute: 62.6 10*3/uL (ref 19.0–186.0)
Retic Ct Pct: 2.3 % (ref 0.4–3.1)
Reticulocyte Hemoglobin: 35.2 pg (ref >27.9)

## 2022-02-20 LAB — COMPREHENSIVE METABOLIC PANEL
ALT: 5 U/L (ref 0–44)
AST: 16 U/L (ref 15–41)
Albumin: 3.8 g/dL (ref 3.5–5.0)
Alkaline Phosphatase: 49 U/L (ref 38–126)
Anion gap: 7 (ref 5–15)
BUN: 14 mg/dL (ref 8–23)
CO2: 25 mmol/L (ref 22–32)
Calcium: 9.2 mg/dL (ref 8.9–10.3)
Chloride: 108 mmol/L (ref 98–111)
Creatinine, Ser: 0.6 mg/dL (ref 0.44–1.00)
GFR, Estimated: >60 mL/min (ref >60)
Glucose, Bld: 131 mg/dL — ABNORMAL HIGH (ref 70–99)
Potassium: 3.2 mmol/L — ABNORMAL LOW (ref 3.5–5.1)
Sodium: 140 mmol/L (ref 135–145)
Total Bilirubin: 0.4 mg/dL (ref 0.3–1.2)
Total Protein: 6.6 g/dL (ref 6.5–8.1)

## 2022-02-20 LAB — VITAMIN B12: Vitamin B-12: 327 pg/mL (ref 180–914)

## 2022-02-20 LAB — FERRITIN: Ferritin: 88 ng/mL (ref 11–307)

## 2022-02-20 LAB — IRON AND IRON BINDING CAPACITY (CC-WL,HP ONLY)
Iron: 66 ug/dL (ref 28–170)
Saturation Ratios: 23 % (ref 10.4–31.8)
TIBC: 284 ug/dL (ref 250–450)
UIBC: 218 ug/dL (ref 148–442)

## 2022-02-23 LAB — METHYLMALONIC ACID, SERUM: Methylmalonic Acid, Quantitative: 119 nmol/L (ref 0–378)

## 2022-03-03 ENCOUNTER — Encounter: Payer: Self-pay | Admitting: Hematology

## 2022-03-04 ENCOUNTER — Ambulatory Visit (INDEPENDENT_AMBULATORY_CARE_PROVIDER_SITE_OTHER): Payer: Medicare PPO | Admitting: Primary Care

## 2022-03-04 ENCOUNTER — Encounter: Payer: Self-pay | Admitting: Primary Care

## 2022-03-04 VITALS — BP 148/86 | HR 82 | Temp 99.6°F | Ht 60.5 in | Wt 131.2 lb

## 2022-03-04 DIAGNOSIS — R0681 Apnea, not elsewhere classified: Secondary | ICD-10-CM | POA: Diagnosis not present

## 2022-03-04 NOTE — Patient Instructions (Signed)
  Sleep apnea is defined as period of 10 seconds or longer when you stop breathing at night. This can happen multiple times a night. Dx sleep apnea is when this occurs more than 5 times an hour.    Mild OSA 5-15 apneic events an hour Moderate OSA 15-30 apneic events an hour Severe OSA > 30 apneic events an hour   Untreated sleep apnea puts you at higher risk for cardiac arrhythmias, pulmonary HTN, stroke and diabetes   Treatment options include weight loss, side sleeping position, oral appliance, CPAP therapy or referral to ENT for possible surgical options    Recommendations: Focus on side sleeping position Work on weight loss efforts if indicated  Do not drive if experiencing excessive daytime sleepiness of fatigue    Orders: Home sleep study re: loud snoring (ordered)    Follow-up: Please call to schedule follow-up 1-2 weeks after completing home sleep study to review results and treatment if needed

## 2022-03-04 NOTE — Progress Notes (Signed)
$'@Patient'J$  ID: Paula Griffith, female    DOB: 25-Dec-1945, 76 y.o.   MRN: [id number]  Chief Complaint  Patient presents with   Consult    Referring provider: Biagio Borg, MD  HPI: 76 year old female, never smoked.  Past medical history significant for hypertension, asthma, allergic rhinitis, chronic hyponatremia.  03/04/2022 Patient presents today for sleep consult. She was told during a recent hospitalization in August for hyponatremia that she stopped breathing while she was sleeping. She has no snoring symptoms that she is aware of. No associated daytime sleepiness. She wakes up several times a night to use the restroom, she has no issues falling back to sleep. She feels rested most mornings when she wakes up. No symptoms of narcolepsy, cataplexy or sleep walking.   Sleep questionnaire Symptoms-  witnessed apnea Prior sleep study- No Bedtime- 8-8:3pm Time to fall asleep- 30 mins  Nocturnal awakenings- 3-4 times a night Out of bed/start of day- 6am Weight changes- Up Do you operate heavy machinery- No Do you currently wear CPAP- No Do you current wear oxygen- No Epworth- 0   Allergies  Allergen Reactions   Benazepril Cough    cough   Amlodipine     Leg edema at 5 mg/d   Fluad [Influenza Vac A&B Surf Ant Adj]     Very sick   Other     BAND-AID CAUSE SKIN TEARS/BRUISES   Codeine Nausea Only   Tramadol Other (See Comments)    Dizzy, nauseated    Immunization History  Administered Date(s) Administered   Fluad Quad(high Dose 65+) 04/03/2019, 08/14/2020, 03/10/2021   Influenza Split 04/08/2011, 04/08/2012   Influenza, High Dose Seasonal PF 04/20/2016, 02/23/2017, 03/01/2018   Influenza,inj,Quad PF,6+ Mos 03/15/2013, 04/23/2014, 03/21/2015   Moderna SARS-COV2 Booster Vaccination 04/20/2020   Moderna Sars-Covid-2 Vaccination 08/21/2019, 09/19/2019   Pneumococcal Conjugate-13 08/24/2016   Pneumococcal Polysaccharide-23 12/09/2012   Tdap 04/20/2016    Past Medical  History:  Diagnosis Date   Allergic rhinitis    Asthma    Cervicalgia    Colon polyps    Complication of anesthesia    Diverticulosis of colon    Glucose intolerance (impaired glucose tolerance)    HTN (hypertension)    LBP (low back pain)    Leukemia (HCC)    Osteoarthritis    PONV (postoperative nausea and vomiting)    PVD (peripheral vascular disease) ([place]) 2011   Right Carotid Dr Scot Dock   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 For shortness of breath 18 g 5   amLODipine (NORVASC) 5 MG tablet TAKE 1 TABLET BY MOUTH EVERY DAY 90 tablet 3   anagrelide (AGRYLIN) 1 MG capsule TAKE 1 CAPSULE BY MOUTH DAILY EXCEPT 2 DAYS A WEEK (TUESDAYS AND THURSDAYS) DO NOT TAKE. (Patient taking differently: Take 1 mg by mouth See admin instructions. TAKE 1 CAPSULE BY MOUTH DAILY EXCEPT 2 DAYS A WEEK (TUESDAYS AND THURSDAYS) DO NOT TAKE.) 66 capsule 1   Ascorbic Acid (VITAMIN C) 1000 MG tablet Take 1,000 mg by mouth daily.  aspirin 325 MG EC tablet Take 325 mg by mouth daily after breakfast.     carvedilol (COREG) 25 MG tablet Take 1 tablet (25 mg total) by mouth 2 (two) times daily. (Patient taking differently: Take 50 mg by mouth daily.) 60 tablet 11   Cholecalciferol (VITAMIN D3) 2000 units TABS Take 2,000 Units by mouth daily.     cloNIDine (CATAPRES - DOSED IN MG/24 HR) 0.1 mg/24hr patch Place 1 patch (0.1 mg total) onto the skin once a week. 4 patch 12   Fluticasone-Salmeterol (ADVAIR) 250-50 MCG/DOSE AEPB Please specify directions, refills and quantity 1 each 11    gabapentin (NEURONTIN) 100 MG capsule TAKE 1 CAPSULE BY MOUTH THREE TIMES A DAY (Patient taking differently: Take 100 mg by mouth 3 (three) times daily.) 270 capsule 1   ketoconazole (NIZORAL) 2 % cream Apply 1 Application topically 2 (two) times daily. 60 g 1   Multiple Vitamins-Minerals (ADULT GUMMY PO) Take 2 tablets by mouth daily.     trolamine salicylate (ASPERCREME) 10 % cream Apply 1 application topically 2 (two) times daily as needed for muscle pain.     urea (URE-NA) 15 g PACK oral packet Take 15 g (one packet) by mouth daily. 30 packet 0   No facility-administered medications prior to visit.   Review of Systems  Review of Systems  Constitutional: Negative.   HENT: Negative.    Respiratory:  Positive for apnea.   Psychiatric/Behavioral:  Positive for sleep disturbance.     Physical Exam  BP (!) 148/86   Pulse 82   Temp 99.6 F (37.6 C) (Oral)   Ht 5' 0.5" (1.537 m)   Wt 131 lb 3.2 oz (59.5 kg)   SpO2 99%   BMI 25.20 kg/m  Physical Exam Constitutional:      Appearance: Normal appearance.  HENT:     Head: Normocephalic and atraumatic.     Mouth/Throat:     Mouth: Mucous membranes are moist.     Pharynx: Oropharynx is clear.  Cardiovascular:     Rate and Rhythm: Normal rate and regular rhythm.  Pulmonary:     Effort: Pulmonary effort is normal.     Breath sounds: Normal breath sounds.  Musculoskeletal:        General: Normal range of motion.     Cervical back: Normal range of motion and neck supple.  Skin:    General: Skin is warm and dry.  Neurological:     General: No focal deficit present.     Mental Status: She is alert and oriented to person, place, and time. Mental status is at baseline.  Psychiatric:        Mood and Affect: Mood normal.        Behavior: Behavior normal.        Thought Content: Thought content normal.        Judgment: Judgment normal.      Lab Results:  CBC    Component Value Date/Time   WBC 4.8 02/20/2022 1041   RBC 2.72  (L) 02/20/2022 1042   RBC 2.69 (L) 02/20/2022 1041   HGB 8.6 (L) 02/20/2022 1041   HGB 10.4 (L) 05/27/2017 0903   HCT 24.9 (L) 02/20/2022 1041   HCT 30.8 (L) 05/27/2017 0903   PLT 309 02/20/2022 1041   PLT 306 05/27/2017 0903   MCV 92.6 02/20/2022 1041   MCV 92.1 05/27/2017 0903   MCH 32.0 02/20/2022 1041   MCHC 34.5 02/20/2022 1041   RDW 13.2 02/20/2022 1041  RDW 12.1 05/27/2017 0903   LYMPHSABS 1.1 02/20/2022 1041   LYMPHSABS 1.6 05/27/2017 0903   MONOABS 0.5 02/20/2022 1041   MONOABS 0.5 05/27/2017 0903   EOSABS 0.0 02/20/2022 1041   EOSABS 0.1 05/27/2017 0903   BASOSABS 0.0 02/20/2022 1041   BASOSABS 0.1 05/27/2017 0903    BMET    Component Value Date/Time   NA 140 02/20/2022 1041   NA 133 (L) 05/27/2017 0903   K 3.2 (L) 02/20/2022 1041   K 3.5 05/27/2017 0903   CL 108 02/20/2022 1041   CL 105 07/08/2012 0838   CO2 25 02/20/2022 1041   CO2 25 05/27/2017 0903   GLUCOSE 131 (H) 02/20/2022 1041   GLUCOSE 105 05/27/2017 0903   GLUCOSE 118 (H) 07/08/2012 0838   BUN 14 02/20/2022 1041   BUN 12.2 05/27/2017 0903   CREATININE 0.60 02/20/2022 1041   CREATININE 0.87 07/24/2021 1259   CREATININE 0.9 05/27/2017 0903   CALCIUM 9.2 02/20/2022 1041   CALCIUM 9.4 05/27/2017 0903   GFRNONAA >60 02/20/2022 1041   GFRNONAA >60 07/24/2021 1259   GFRAA >60 03/27/2020 0854    BNP No results found for: "BNP"  ProBNP No results found for: "PROBNP"  Imaging: No results found.   Assessment & Plan:   Witnessed episode of apnea - Witnessed apnea during recent hospitalization. She has no symptoms of snoring or daytime sleepiness. Epworth score 0/24. BMI 25. Concern patient could have underlying obstructive sleep apnea, needs home sleep study to evaluate. Reviewed risks of untreated sleep apnea including cardiac arrhythmias, pulm HTN, stroke or diabetes. We also discussed treatment options including weight loss, oral appliance, CPAP or referral to ENT for possible surgical  options. FU 1-2 weeks after sleep study to review results and treatment options if needed.    Martyn Ehrich, NP 03/08/2022

## 2022-03-08 DIAGNOSIS — R0681 Apnea, not elsewhere classified: Secondary | ICD-10-CM | POA: Insufficient documentation

## 2022-03-08 NOTE — Assessment & Plan Note (Signed)
-   Witnessed apnea during recent hospitalization. She has no symptoms of snoring or daytime sleepiness. Epworth score 0/24. BMI 25. Concern patient could have underlying obstructive sleep apnea, needs home sleep study to evaluate. Reviewed risks of untreated sleep apnea including cardiac arrhythmias, pulm HTN, stroke or diabetes. We also discussed treatment options including weight loss, oral appliance, CPAP or referral to ENT for possible surgical options. FU 1-2 weeks after sleep study to review results and treatment options if needed.

## 2022-03-09 ENCOUNTER — Other Ambulatory Visit: Payer: Self-pay | Admitting: Internal Medicine

## 2022-03-09 NOTE — Progress Notes (Signed)
Reviewed and agree with assessment/plan.   Chesley Mires, MD [facility] Pulmonary/Critical Care 03/09/2022, 8:49 AM Pager:  [phone]

## 2022-03-10 NOTE — Telephone Encounter (Signed)
original prescription was discontinued on 01/19/2022 by Swayze, Ava, DO for the following reason: pls [email or web address] KitchenJohny Chess

## 2022-03-16 ENCOUNTER — Telehealth: Payer: Self-pay | Admitting: Internal Medicine

## 2022-03-16 DIAGNOSIS — E871 Hypo-osmolality and hyponatremia: Secondary | ICD-10-CM

## 2022-03-16 DIAGNOSIS — D649 Anemia, unspecified: Secondary | ICD-10-CM

## 2022-03-16 NOTE — Telephone Encounter (Signed)
Med was rx by Dr. Benny Lennert. Pls advise.Marland KitchenJohny Griffith

## 2022-03-16 NOTE — Telephone Encounter (Signed)
Patient needs her UREA refilled - feet and legs are swelling - please advise.

## 2022-03-17 MED ORDER — UREA 15 G PO PACK
15.0000 g | PACK | Freq: Every day | ORAL | 3 refills | Status: DC
Start: 2022-03-17 — End: 2022-09-06

## 2022-03-17 NOTE — Telephone Encounter (Signed)
Okay.  Done.  Check c-Met.  Thanks

## 2022-04-02 ENCOUNTER — Other Ambulatory Visit: Payer: Self-pay | Admitting: Internal Medicine

## 2022-04-02 ENCOUNTER — Ambulatory Visit: Payer: Medicare PPO | Admitting: Internal Medicine

## 2022-04-02 ENCOUNTER — Encounter: Payer: Self-pay | Admitting: Internal Medicine

## 2022-04-02 VITALS — BP 138/72 | HR 80 | Temp 98.3°F | Ht 61.0 in | Wt 128.0 lb

## 2022-04-02 DIAGNOSIS — I10 Essential (primary) hypertension: Secondary | ICD-10-CM

## 2022-04-02 DIAGNOSIS — R609 Edema, unspecified: Secondary | ICD-10-CM

## 2022-04-02 DIAGNOSIS — M7989 Other specified soft tissue disorders: Secondary | ICD-10-CM

## 2022-04-02 DIAGNOSIS — M79661 Pain in right lower leg: Secondary | ICD-10-CM

## 2022-04-02 LAB — BASIC METABOLIC PANEL
BUN: 39 mg/dL — ABNORMAL HIGH (ref 6–23)
CO2: 30 mEq/L (ref 19–32)
Calcium: 9.7 mg/dL (ref 8.4–10.5)
Chloride: 100 mEq/L (ref 96–112)
Creatinine, Ser: 0.74 mg/dL (ref 0.40–1.20)
GFR: 78.56 mL/min (ref >60.00)
Glucose, Bld: 112 mg/dL — ABNORMAL HIGH (ref 70–99)
Potassium: 3.1 mEq/L — ABNORMAL LOW (ref 3.5–5.1)
Sodium: 136 mEq/L (ref 135–145)

## 2022-04-02 LAB — CBC WITH DIFFERENTIAL/PLATELET
Basophils Absolute: 0 10*3/uL (ref 0.0–0.1)
Basophils Relative: 0.9 % (ref 0.0–3.0)
Eosinophils Absolute: 0 10*3/uL (ref 0.0–0.7)
Eosinophils Relative: 1.1 % (ref 0.0–5.0)
HCT: 30.4 % — ABNORMAL LOW (ref 36.0–46.0)
Hemoglobin: 10.1 g/dL — ABNORMAL LOW (ref 12.0–15.0)
Lymphocytes Relative: 32.3 % (ref 12.0–46.0)
Lymphs Abs: 1.5 10*3/uL (ref 0.7–4.0)
MCHC: 33.2 g/dL (ref 30.0–36.0)
MCV: 95 fl (ref 78.0–100.0)
Monocytes Absolute: 0.6 10*3/uL (ref 0.1–1.0)
Monocytes Relative: 12.2 % — ABNORMAL HIGH (ref 3.0–12.0)
Neutro Abs: 2.5 10*3/uL (ref 1.4–7.7)
Neutrophils Relative %: 53.5 % (ref 43.0–77.0)
Platelets: 182 10*3/uL (ref 150.0–400.0)
RBC: 3.2 Mil/uL — ABNORMAL LOW (ref 3.87–5.11)
RDW: 12.6 % (ref 11.5–15.5)
WBC: 4.7 10*3/uL (ref 4.0–10.5)

## 2022-04-02 LAB — HEPATIC FUNCTION PANEL
ALT: 7 U/L (ref 0–35)
AST: 19 U/L (ref 0–37)
Albumin: 3.9 g/dL (ref 3.5–5.2)
Alkaline Phosphatase: 50 U/L (ref 39–117)
Bilirubin, Direct: 0.1 mg/dL (ref 0.0–0.3)
Total Bilirubin: 0.3 mg/dL (ref 0.2–1.2)
Total Protein: 6.9 g/dL (ref 6.0–8.3)

## 2022-04-02 LAB — BRAIN NATRIURETIC PEPTIDE: Pro B Natriuretic peptide (BNP): 75 pg/mL (ref 0.0–100.0)

## 2022-04-02 LAB — TSH: TSH: 1.12 u[IU]/mL (ref 0.35–5.50)

## 2022-04-02 MED ORDER — POTASSIUM CHLORIDE ER 10 MEQ PO TBCR: 10.0000 meq | EXTENDED_RELEASE_TABLET | Freq: Every day | ORAL | 3 refills | Status: DC

## 2022-04-02 MED ORDER — HYDROCHLOROTHIAZIDE 12.5 MG PO CAPS
12.5000 mg | ORAL_CAPSULE | Freq: Every day | ORAL | 3 refills | Status: DC
Start: 2022-04-02 — End: 2022-06-30

## 2022-04-02 NOTE — Assessment & Plan Note (Signed)
Cant r/o dvt - for venous doppler stat

## 2022-04-02 NOTE — Progress Notes (Signed)
Patient ID: Paula Griffith, female   DOB: 1945/09/17, 76 y.o.   MRN: [id number]        Chief Complaint: follow up HTN, HLD and hyperglycemia , dry mouth       HPI:  Paula Griffith is a 76 y.o. female here with c/o worsening dry mouth since starting clonidine 0.1 mg, and has been drinking more fluids, now with 1-2 wks worsening bilateral pedal edema, as well as also 2 days onset swelling on the right leg below the knee with discomfort.  Pt denies chest pain, increased sob or doe, wheezing, orthopnea, PND,  palpitations, dizziness or syncope.   Pt denies polydipsia, polyuria, or new focal neuro s/s.    Pt denies fever, wt loss, night sweats, loss of appetite, or other constitutional symptoms         Wt Readings from Last 3 Encounters:  04/02/22 128 lb (58.1 kg)  03/04/22 131 lb 3.2 oz (59.5 kg)  02/03/22 125 lb 8 oz (56.9 kg)   BP Readings from Last 3 Encounters:  04/02/22 138/72  03/04/22 (!) 148/86  02/03/22 (!) 158/58         Past Medical History:  Diagnosis Date   Allergic rhinitis    Asthma    Cervicalgia    Colon polyps    Complication of anesthesia    Diverticulosis of colon    Glucose intolerance (impaired glucose tolerance)    HTN (hypertension)    LBP (low back pain)    Leukemia (HCC)    Osteoarthritis    PONV (postoperative nausea and vomiting)    PVD (peripheral vascular disease) ([place]) 2011   Right Carotid Dr Scot Dock   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   Spondylolisthesis of lumbar region    Thrombocytopathy [facility]) 2011   Dr Ralene Ok   Past Surgical History:  Procedure Laterality Date   [place] DECOMP/DISCECTOMY FUSION N/A 09/22/2017   Procedure: ANTERIOR CERVICAL DECOMPRESSION AND FUSION CERVICAL THREE-FOUR.;  Surgeon: Eustace Moore, MD;  Location: [place];  Service: Neurosurgery;  Laterality: N/A;  anterior   BACK SURGERY     BREAST EXCISIONAL BIOPSY Left    BREAST SURGERY     Left   LUMBAR LAMINECTOMY/DECOMPRESSION  MICRODISCECTOMY Left 01/06/2018   Procedure: laminectomy Lumbar five -Sacral one - left, Lumbar two-Lumbar three - bilateral;  Surgeon: Eustace Moore, MD;  Location: [place];  Service: Neurosurgery;  Laterality: Left;    reports that she has never smoked. She has been exposed to tobacco smoke. She has never used smokeless tobacco. She reports that she does not drink alcohol and does not use drugs. family history includes Arthritis in her mother; Breast cancer in her maternal grandmother; Coronary artery disease in an other family member; Diabetes in her sister; Early death (age of onset: 20) in her father; Lung cancer in an other family member; Pancreatic cancer in her mother. Allergies  Allergen Reactions   Benazepril Cough    cough   Amlodipine     Leg edema at 5 mg/d   Fluad [Influenza Vac A&B Surf Ant Adj]     Very sick   Other     BAND-AID CAUSE SKIN TEARS/BRUISES   Codeine Nausea Only   Tramadol Other (See Comments)    Dizzy, nauseated   Current Outpatient Medications on File Prior to Visit  Medication Sig Dispense Refill   albuterol (PROAIR HFA) 108 (90 Base) MCG/ACT inhaler Inhale 2 puffs into the  lungs every 4 (four) hours as needed. For shortness of breath 18 g 5   anagrelide (AGRYLIN) 1 MG capsule TAKE 1 CAPSULE BY MOUTH DAILY EXCEPT 2 DAYS A WEEK (TUESDAYS AND THURSDAYS) DO NOT TAKE. (Patient taking differently: Take 1 mg by mouth See admin instructions. TAKE 1 CAPSULE BY MOUTH DAILY EXCEPT 2 DAYS A WEEK (TUESDAYS AND THURSDAYS) DO NOT TAKE.) 66 capsule 1   Ascorbic Acid (VITAMIN C) 1000 MG tablet Take 1,000 mg by mouth daily.     aspirin 325 MG EC tablet Take 325 mg by mouth daily after breakfast.     carvedilol (COREG) 25 MG tablet Take 1 tablet (25 mg total) by mouth 2 (two) times daily. (Patient taking differently: Take 50 mg by mouth daily.) 60 tablet 11   Cholecalciferol (VITAMIN D3) 2000 units TABS Take 2,000 Units by mouth daily.     Fluticasone-Salmeterol (ADVAIR) 250-50  MCG/DOSE AEPB Please specify directions, refills and quantity 1 each 11   gabapentin (NEURONTIN) 100 MG capsule TAKE 1 CAPSULE BY MOUTH THREE TIMES A DAY (Patient taking differently: Take 100 mg by mouth 3 (three) times daily.) 270 capsule 1   ketoconazole (NIZORAL) 2 % cream Apply 1 Application topically 2 (two) times daily. 60 g 1   Multiple Vitamins-Minerals (ADULT GUMMY PO) Take 2 tablets by mouth daily.     trolamine salicylate (ASPERCREME) 10 % cream Apply 1 application topically 2 (two) times daily as needed for muscle pain.     urea (URE-NA) 15 g PACK oral packet Take 15 g (one packet) by mouth daily. 30 packet 3   amLODipine (NORVASC) 5 MG tablet TAKE 1 TABLET BY MOUTH EVERY DAY (Patient not taking: Reported on 04/02/2022) 90 tablet 3   [DISCONTINUED] losartan-hydrochlorothiazide (HYZAAR) 100-25 MG tablet Take 1 tablet by mouth daily. 90 tablet 3   No current facility-administered medications on file prior to visit.        ROS:  All others reviewed and negative.  Objective        PE:  BP 138/72 (BP Location: Right Arm, Patient Position: Sitting, Cuff Size: Normal)   Pulse 80   Temp 98.3 F (36.8 C) (Oral)   Ht '5\' 1"'$  (1.549 m)   Wt 128 lb (58.1 kg)   SpO2 100%   BMI 24.19 kg/m                 Constitutional: Pt appears in NAD               HENT: Head: NCAT.                Right Ear: External ear normal.                 Left Ear: External ear normal.                Eyes: . Pupils are equal, round, and reactive to light. Conjunctivae and EOM are normal               Nose: without d/c or deformity               Neck: Neck supple. Gross normal ROM               Cardiovascular: Normal rate and regular rhythm.                 Pulmonary/Chest: Effort normal and breath sounds without rales or wheezing.  Abd:  Soft, NT, ND, + BS, no organomegaly               Neurological: Pt is alert. At baseline orientation, motor grossly intact               Skin:  LE edema - 1+  left ankle edema, RLE with 1-2+ edema below the knee               Psychiatric: Pt behavior is normal without agitation   Micro: none  Cardiac tracings I have personally interpreted today:  none  Pertinent Radiological findings (summarize): none   Lab Results  Component Value Date   WBC 4.7 04/02/2022   HGB 10.1 (L) 04/02/2022   HCT 30.4 (L) 04/02/2022   PLT 182.0 04/02/2022   GLUCOSE 112 (H) 04/02/2022   CHOL 158 03/01/2018   TRIG 84.0 03/01/2018   HDL 47.10 03/01/2018   LDLCALC 94 03/01/2018   ALT 7 04/02/2022   AST 19 04/02/2022   NA 136 04/02/2022   K 3.1 (L) 04/02/2022   CL 100 04/02/2022   CREATININE 0.74 04/02/2022   BUN 39 (H) 04/02/2022   CO2 30 04/02/2022   TSH 1.12 04/02/2022   INR 0.97 12/29/2017   HGBA1C 5.4 04/28/2021   Assessment/Plan:  GAYNA BRADDY is a 76 y.o. Black or African American [2] female with  has a past medical history of Allergic rhinitis, Asthma, Cervicalgia, Colon polyps, Complication of anesthesia, Diverticulosis of colon, Glucose intolerance (impaired glucose tolerance), HTN (hypertension), LBP (low back pain), Leukemia ([place]), Osteoarthritis, PONV (postoperative nausea and vomiting), PVD (peripheral vascular disease) ([place]) (2011), Retinal infarct (2011), Spondylolisthesis of lumbar region, and Thrombocytopathy ([place]) (2011).  Pain and swelling of right lower leg Cant r/o dvt - for venous doppler stat  Edema I suspect venous insufficiency, for labs including cbc and bnp, also start hct 12.5 mg qd for better BP control instead of clonidine leading to dry mouth  Essential hypertension Ok to change the clondine to trial hct 12.5 mg qd, f/u pcp in 2 weeks  Followup: No follow-ups on file.  Cathlean Cower, MD 04/02/2022 8:12 PM [facility]

## 2022-04-02 NOTE — Assessment & Plan Note (Addendum)
I suspect venous insufficiency, for labs including cbc and bnp, also start hct 12.5 mg qd for better BP control instead of clonidine leading to dry mouth

## 2022-04-02 NOTE — Assessment & Plan Note (Signed)
Ok to change the clondine to trial hct 12.5 mg qd, f/u pcp in 2 weeks

## 2022-04-02 NOTE — Patient Instructions (Addendum)
Ok to STOP all clonidine  Please take all new medication as prescribed - the mild fluid pill (HCT) for blood pressure and swelling  Please continue all other medications as before, and refills have been done if requested.  Please have the pharmacy call with any other refills you may need.  Please keep your appointments with your specialists as you may have planned  You will be contacted regarding the referral for: right leg vein check test for blood clot  Please go to the LAB at the blood drawing area for the tests to be done  You will be contacted by phone if any changes need to be made immediately.  Otherwise, you will receive a letter about your results with an explanation, but please check with MyChart first.  Please remember to sign up for MyChart if you have not done so, as this will be important to you in the future with finding out test results, communicating by private email, and scheduling acute appointments online when needed.  Please see Dr Alain Marion in 2 weeks

## 2022-04-03 ENCOUNTER — Ambulatory Visit (HOSPITAL_COMMUNITY)
Admission: RE | Admit: 2022-04-03 | Discharge: 2022-04-03 | Disposition: A | Discharge status: Home / Self Care | Payer: Medicare PPO | Source: Ambulatory Visit | Attending: Internal Medicine | Admitting: Internal Medicine

## 2022-04-03 DIAGNOSIS — M79661 Pain in right lower leg: Secondary | ICD-10-CM | POA: Insufficient documentation

## 2022-04-03 DIAGNOSIS — M7989 Other specified soft tissue disorders: Secondary | ICD-10-CM | POA: Diagnosis not present

## 2022-04-06 ENCOUNTER — Telehealth: Payer: Self-pay | Admitting: Internal Medicine

## 2022-04-06 NOTE — Telephone Encounter (Signed)
error 

## 2022-04-16 ENCOUNTER — Ambulatory Visit: Payer: Medicare PPO | Admitting: Internal Medicine

## 2022-04-20 ENCOUNTER — Ambulatory Visit: Payer: Medicare PPO | Admitting: Internal Medicine

## 2022-04-23 ENCOUNTER — Inpatient Hospital Stay: Payer: Medicare PPO | Admitting: Hematology

## 2022-04-23 ENCOUNTER — Inpatient Hospital Stay: Payer: Medicare PPO | Attending: Hematology

## 2022-04-23 ENCOUNTER — Other Ambulatory Visit: Payer: Self-pay

## 2022-04-23 VITALS — BP 166/82 | HR 61 | Temp 98.4°F | Resp 18 | Ht 61.0 in | Wt 130.3 lb

## 2022-04-23 DIAGNOSIS — D649 Anemia, unspecified: Secondary | ICD-10-CM | POA: Diagnosis not present

## 2022-04-23 DIAGNOSIS — D75839 Thrombocytosis, unspecified: Secondary | ICD-10-CM | POA: Diagnosis not present

## 2022-04-23 DIAGNOSIS — M549 Dorsalgia, unspecified: Secondary | ICD-10-CM | POA: Diagnosis not present

## 2022-04-23 DIAGNOSIS — I10 Essential (primary) hypertension: Secondary | ICD-10-CM | POA: Insufficient documentation

## 2022-04-23 DIAGNOSIS — Z79899 Other long term (current) drug therapy: Secondary | ICD-10-CM | POA: Diagnosis not present

## 2022-04-23 DIAGNOSIS — D471 Chronic myeloproliferative disease: Secondary | ICD-10-CM

## 2022-04-23 DIAGNOSIS — M1990 Unspecified osteoarthritis, unspecified site: Secondary | ICD-10-CM | POA: Diagnosis not present

## 2022-04-23 LAB — CBC WITH DIFFERENTIAL/PLATELET
Abs Immature Granulocytes: 0.01 10*3/uL (ref 0.00–0.07)
Basophils Absolute: 0.1 10*3/uL (ref 0.0–0.1)
Basophils Relative: 1 %
Eosinophils Absolute: 0 10*3/uL (ref 0.0–0.5)
Eosinophils Relative: 1 %
HCT: 27.1 % — ABNORMAL LOW (ref 36.0–46.0)
Hemoglobin: 9.6 g/dL — ABNORMAL LOW (ref 12.0–15.0)
Immature Granulocytes: 0 %
Lymphocytes Relative: 28 %
Lymphs Abs: 1.5 10*3/uL (ref 0.7–4.0)
MCH: 31.6 pg (ref 26.0–34.0)
MCHC: 35.4 g/dL (ref 30.0–36.0)
MCV: 89.1 fL (ref 80.0–100.0)
Monocytes Absolute: 0.7 10*3/uL (ref 0.1–1.0)
Monocytes Relative: 12 %
Neutro Abs: 3.1 10*3/uL (ref 1.7–7.7)
Neutrophils Relative %: 58 %
Platelets: 218 10*3/uL (ref 150–400)
RBC: 3.04 MIL/uL — ABNORMAL LOW (ref 3.87–5.11)
RDW: 11.5 % (ref 11.5–15.5)
WBC: 5.4 10*3/uL (ref 4.0–10.5)
nRBC: 0 % (ref 0.0–0.2)

## 2022-04-23 LAB — COMPREHENSIVE METABOLIC PANEL
ALT: 5 U/L (ref 0–44)
AST: 18 U/L (ref 15–41)
Albumin: 3.7 g/dL (ref 3.5–5.0)
Alkaline Phosphatase: 53 U/L (ref 38–126)
Anion gap: 7 (ref 5–15)
BUN: 12 mg/dL (ref 8–23)
CO2: 32 mmol/L (ref 22–32)
Calcium: 8.9 mg/dL (ref 8.9–10.3)
Chloride: 96 mmol/L — ABNORMAL LOW (ref 98–111)
Creatinine, Ser: 0.9 mg/dL (ref 0.44–1.00)
GFR, Estimated: >60 mL/min (ref >60)
Glucose, Bld: 106 mg/dL — ABNORMAL HIGH (ref 70–99)
Potassium: 2.9 mmol/L — ABNORMAL LOW (ref 3.5–5.1)
Sodium: 135 mmol/L (ref 135–145)
Total Bilirubin: 0.3 mg/dL (ref 0.3–1.2)
Total Protein: 6.6 g/dL (ref 6.5–8.1)

## 2022-04-23 LAB — IRON AND IRON BINDING CAPACITY (CC-WL,HP ONLY)
Iron: 100 ug/dL (ref 28–170)
Saturation Ratios: 32 % — ABNORMAL HIGH (ref 10.4–31.8)
TIBC: 312 ug/dL (ref 250–450)
UIBC: 212 ug/dL (ref 148–442)

## 2022-04-23 MED ORDER — ANAGRELIDE HCL 1 MG PO CAPS: 1.0000 mg | ORAL_CAPSULE | ORAL | 1 refills | Status: DC

## 2022-04-23 NOTE — Progress Notes (Signed)
[place]   [phone]) [phone] Fax:(336) [phone]   Clinic Follow up Note   Patient Care Team: [name], [name], MD as PCP - General Angelia Mould, MD (Vascular Surgery) Nahser, Wonda Cheng, MD as Consulting Physician (Cardiology) Truitt Merle, MD as Consulting Physician (Hematology) Eustace Moore, MD as Consulting Physician (Neurosurgery) [facility] as Consulting Physician (Ophthalmology)  Date of Service:  04/23/2022  CHIEF COMPLAINT: f/u of MPN  CURRENT THERAPY:  Anagrelide, starting 06/10/15, currently 1 mg daily 5 days a week  ASSESSMENT & PLAN:  Paula Griffith is a 76 y.o. female with   1. Myeloproliferative neoplasm-essential thrombocythemia, JAK2 mutation negative. -We previously reviewed the natural history of ET, most patients do well, major complication is thrombosis. -She was previously on Hydrea, switched to anagrelide in 05/2015 due to anemia from Hydrea. -Her anagrelide dose has been titrated as needed. We will monitor her EKG periodically. She is currently on Anagrelide '1mg'$  daily except Tuesdays/Thursdays.  -Labs overall stable. Plan to continue Anagrelide '1mg'$  once daily 5 days a week -lab and F/u in 4 months    2. Anemia -secondary to MPN and previous hydrea  -hgb improved to 9.6 today (04/23/22), iron has been WNL   3. Hypertension and other medical problems -continue follow-up with PCP -her potassium is low today (2.9, 04/23/22). I discussed her HCTZ can cause low potassium. I encouraged her to increase her oral KCL to TID for next 3 days, then BID.   4. Arthritis and back pain -continue f/u with PCP and pain management clinic. -Back pain is stable and moderate, she has no limitation on her activities      Plan -lab in 3 and 6 months -f/u in 6 months  -Continue anagrelide at same dose   No problem-specific Assessment & Plan notes found for this encounter.   INTERVAL HISTORY:  Paula Griffith is  here for a follow up of MPN. She was last seen by me on 12/22/21. She presents to the clinic alone. She reports she is doing well overall, no new concerns.   All other systems were reviewed with the patient and are negative.  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   embolic   Spondylolisthesis of lumbar region    Thrombocytopathy [facility]) 2011   Dr Ralene Ok    SURGICAL HISTORY: Past Surgical History:  Procedure Laterality Date   ABDOMINAL HYSTERECTOMY  1989   ANTERIOR CERVICAL DECOMP/DISCECTOMY FUSION N/A 09/22/2017   Procedure: ANTERIOR CERVICAL DECOMPRESSION AND FUSION CERVICAL THREE-FOUR.;  Surgeon: Eustace Moore, MD;  Location: [place];  Service: Neurosurgery;  Laterality: N/A;  anterior   BACK SURGERY     BREAST EXCISIONAL BIOPSY Left    BREAST SURGERY     Left   LUMBAR LAMINECTOMY/DECOMPRESSION MICRODISCECTOMY Left 01/06/2018   Procedure: laminectomy Lumbar five -Sacral one - left, Lumbar two-Lumbar three - bilateral;  Surgeon: Eustace Moore, MD;  Location: [place];  Service: Neurosurgery;  Laterality:  Left;    I have reviewed the social history and family history with the patient and they are unchanged from previous note.  ALLERGIES:  is allergic to benazepril, amlodipine, fluad [influenza vac a&b surf ant adj], other, codeine, and tramadol.  MEDICATIONS:  Current Outpatient Medications  Medication Sig Dispense Refill   albuterol (PROAIR HFA) 108 (90 Base) MCG/ACT inhaler Inhale 2 puffs into the lungs every 4 (four) hours as needed. For shortness of breath 18 g 5    amLODipine (NORVASC) 5 MG tablet TAKE 1 TABLET BY MOUTH EVERY DAY (Patient not taking: Reported on 04/02/2022) 90 tablet 3   anagrelide (AGRYLIN) 1 MG capsule Take 1 capsule (1 mg total) by mouth See admin instructions. TAKE 1 CAPSULE BY MOUTH DAILY EXCEPT 2 DAYS A WEEK (TUESDAYS AND THURSDAYS) DO NOT TAKE. 120 capsule 1   Ascorbic Acid (VITAMIN C) 1000 MG tablet Take 1,000 mg by mouth daily.     aspirin 325 MG EC tablet Take 325 mg by mouth daily after breakfast.     carvedilol (COREG) 25 MG tablet Take 1 tablet (25 mg total) by mouth 2 (two) times daily. (Patient taking differently: Take 50 mg by mouth daily.) 60 tablet 11   Cholecalciferol (VITAMIN D3) 2000 units TABS Take 2,000 Units by mouth daily.     Fluticasone-Salmeterol (ADVAIR) 250-50 MCG/DOSE AEPB Please specify directions, refills and quantity 1 each 11   gabapentin (NEURONTIN) 100 MG capsule TAKE 1 CAPSULE BY MOUTH THREE TIMES A DAY (Patient taking differently: Take 100 mg by mouth 3 (three) times daily.) 270 capsule 1   hydrochlorothiazide (MICROZIDE) 12.5 MG capsule Take 1 capsule (12.5 mg total) by mouth daily. 90 capsule 3   ketoconazole (NIZORAL) 2 % cream Apply 1 Application topically 2 (two) times daily. 60 g 1   Multiple Vitamins-Minerals (ADULT GUMMY PO) Take 2 tablets by mouth daily.     potassium chloride (KLOR-CON 10) 10 MEQ tablet Take 1 tablet (10 mEq total) by mouth daily. 90 tablet 3   trolamine salicylate (ASPERCREME) 10 % cream Apply 1 application topically 2 (two) times daily as needed for muscle pain.     urea (URE-NA) 15 g PACK oral packet Take 15 g (one packet) by mouth daily. 30 packet 3   No current facility-administered medications for this visit.    PHYSICAL EXAMINATION: ECOG PERFORMANCE STATUS: 1 - Symptomatic but completely ambulatory  Vitals:   04/23/22 1336  BP: (!) 166/82  Pulse: 61  Resp: 18  Temp: 98.4 F (36.9 C)  SpO2: 100%   Wt Readings from Last 3 Encounters:  04/23/22 130 lb 4.8 oz  (59.1 kg)  04/02/22 128 lb (58.1 kg)  03/04/22 131 lb 3.2 oz (59.5 kg)     GENERAL:alert, no distress and comfortable SKIN: skin color normal, no rashes or significant lesions EYES: normal, Conjunctiva are pink and non-injected, sclera clear  NEURO: alert & oriented x 3 with fluent speech  LABORATORY DATA:  I have reviewed the data as listed    Latest Ref Rng & Units 04/23/2022   12:59 PM 04/02/2022    4:11 PM 02/20/2022   10:41 AM  CBC  WBC 4.0 - 10.5 K/uL 5.4  4.7  4.8   Hemoglobin 12.0 - 15.0 g/dL 9.6  10.1  8.6   Hematocrit 36.0 - 46.0 % 27.1  30.4  24.9   Platelets 150 - 400 K/uL 218  182.0  309         Latest Ref Rng &  Units 04/23/2022   12:59 PM 04/02/2022    4:11 PM 02/20/2022   10:41 AM  CMP  Glucose 70 - 99 mg/dL 106  112  131   BUN 8 - 23 mg/dL 12  39  14   Creatinine 0.44 - 1.00 mg/dL 0.90  0.74  0.60   Sodium 135 - 145 mmol/L 135  136  140   Potassium 3.5 - 5.1 mmol/L 2.9  3.1  3.2   Chloride 98 - 111 mmol/L 96  100  108   CO2 22 - 32 mmol/L 32  30  25   Calcium 8.9 - 10.3 mg/dL 8.9  9.7  9.2   Total Protein 6.5 - 8.1 g/dL 6.6  6.9  6.6   Total Bilirubin 0.3 - 1.2 mg/dL 0.3  0.3  0.4   Alkaline Phos 38 - 126 U/L 53  50  49   AST 15 - 41 U/L '18  19  16   '$ ALT 0 - 44 U/L '5  7  5       '$ RADIOGRAPHIC STUDIES: I have personally reviewed the radiological images as listed and agreed with the findings in the report. No results found.    No orders of the defined types were placed in this encounter.  All questions were answered. The patient knows to call the clinic with any problems, questions or concerns. No barriers to learning was detected. The total time spent in the appointment was 20 minutes.     Truitt Merle, MD 04/23/2022   I, Wilburn Mylar, am acting as scribe for Truitt Merle, MD.   I have reviewed the above documentation for accuracy and completeness, and I agree with the above.

## 2022-04-26 ENCOUNTER — Emergency Department (HOSPITAL_COMMUNITY): Payer: Medicare PPO

## 2022-04-26 ENCOUNTER — Encounter (HOSPITAL_COMMUNITY): Payer: Self-pay

## 2022-04-26 ENCOUNTER — Other Ambulatory Visit: Payer: Self-pay

## 2022-04-26 ENCOUNTER — Emergency Department (HOSPITAL_COMMUNITY)
Admission: EM | Admit: 2022-04-26 | Discharge: 2022-04-26 | Discharge status: Against Medical Advice | Payer: Medicare PPO | Attending: Emergency Medicine | Admitting: Emergency Medicine

## 2022-04-26 DIAGNOSIS — Y92480 Sidewalk as the place of occurrence of the external cause: Secondary | ICD-10-CM | POA: Diagnosis not present

## 2022-04-26 DIAGNOSIS — W01198A Fall on same level from slipping, tripping and stumbling with subsequent striking against other object, initial encounter: Secondary | ICD-10-CM | POA: Diagnosis not present

## 2022-04-26 DIAGNOSIS — S0990XA Unspecified injury of head, initial encounter: Secondary | ICD-10-CM | POA: Diagnosis not present

## 2022-04-26 DIAGNOSIS — M25532 Pain in left wrist: Secondary | ICD-10-CM | POA: Diagnosis not present

## 2022-04-26 DIAGNOSIS — Z5321 Procedure and treatment not carried out due to patient leaving prior to being seen by health care provider: Secondary | ICD-10-CM | POA: Insufficient documentation

## 2022-04-26 DIAGNOSIS — S0993XA Unspecified injury of face, initial encounter: Secondary | ICD-10-CM | POA: Diagnosis not present

## 2022-04-26 DIAGNOSIS — Z043 Encounter for examination and observation following other accident: Secondary | ICD-10-CM | POA: Diagnosis not present

## 2022-04-26 DIAGNOSIS — M19032 Primary osteoarthritis, left wrist: Secondary | ICD-10-CM | POA: Diagnosis not present

## 2022-04-26 DIAGNOSIS — S0081XA Abrasion of other part of head, initial encounter: Secondary | ICD-10-CM | POA: Diagnosis not present

## 2022-04-26 DIAGNOSIS — S199XXA Unspecified injury of neck, initial encounter: Secondary | ICD-10-CM | POA: Diagnosis not present

## 2022-04-26 NOTE — ED Provider Triage Note (Signed)
Emergency Medicine Provider Triage Evaluation Note  Paula Griffith , a 76 y.o. female  was evaluated in triage.  Pt complains of mechanical fall with facial trauma just prior to ER arrival. Was getting out of the car to go to church and tripped, striking her face on the ground. She is complaining of pain and abrasions on the face and pain to her left wrist where she tried to brace her fall. Unknown last tetanus. Not on blood thinners. No LOC.   Review of Systems  Positive: Head trauma, mechanical fall, wrist pain Negative: syncope  Physical Exam  BP (!) 169/76 (BP Location: Right Arm)   Pulse 72   Temp 98.2 F (36.8 C) (Oral)   Resp 16   Ht '5\' 1"'$  (1.549 m)   Wt 59.1 kg   SpO2 99%   BMI 24.62 kg/m  Gen:   Awake, no distress   Resp:  Normal effort  MSK:   Moves extremities without difficulty  Other:  Superficial abrasions noted to the nose, left cheek, and lip, no intraoral lacerations, broken left upper front tooth (denture), swelling and pain of the left wrist  Medical Decision Making  Medically screening exam initiated at 11:17 AM.  Appropriate orders placed.  Paula Griffith was informed that the remainder of the evaluation will be completed by another provider, this initial triage assessment does not replace that evaluation, and the importance of remaining in the ED until their evaluation is complete.  Images ordered   Kateri Plummer, PA-C 04/26/22 1119

## 2022-04-26 NOTE — ED Triage Notes (Signed)
Patient reports that she was getting out of the car at church today and the heel of her shoe got caught in a gap in the sidewalk. Patient fell face forward and used her left hand to stop the fall.  Patient has a broken front tooth, facial abrasions, and swelling and pain to the left  wrist.

## 2022-05-06 ENCOUNTER — Ambulatory Visit: Payer: Medicare PPO | Admitting: Internal Medicine

## 2022-05-06 ENCOUNTER — Ambulatory Visit (INDEPENDENT_AMBULATORY_CARE_PROVIDER_SITE_OTHER): Payer: Medicare PPO

## 2022-05-06 ENCOUNTER — Encounter: Payer: Self-pay | Admitting: Internal Medicine

## 2022-05-06 VITALS — BP 168/60 | HR 56 | Temp 99.1°F | Ht 61.0 in | Wt 129.4 lb

## 2022-05-06 DIAGNOSIS — M79642 Pain in left hand: Secondary | ICD-10-CM | POA: Diagnosis not present

## 2022-05-06 DIAGNOSIS — W19XXXD Unspecified fall, subsequent encounter: Secondary | ICD-10-CM

## 2022-05-06 DIAGNOSIS — S60222D Contusion of left hand, subsequent encounter: Secondary | ICD-10-CM

## 2022-05-06 DIAGNOSIS — I10 Essential (primary) hypertension: Secondary | ICD-10-CM | POA: Diagnosis not present

## 2022-05-06 DIAGNOSIS — E876 Hypokalemia: Secondary | ICD-10-CM

## 2022-05-06 DIAGNOSIS — S60222A Contusion of left hand, initial encounter: Secondary | ICD-10-CM | POA: Diagnosis not present

## 2022-05-06 DIAGNOSIS — S60229A Contusion of unspecified hand, initial encounter: Secondary | ICD-10-CM | POA: Insufficient documentation

## 2022-05-06 DIAGNOSIS — W19XXXA Unspecified fall, initial encounter: Secondary | ICD-10-CM | POA: Diagnosis not present

## 2022-05-06 LAB — COMPREHENSIVE METABOLIC PANEL
ALT: 9 U/L (ref 0–35)
AST: 23 U/L (ref 0–37)
Albumin: 3.8 g/dL (ref 3.5–5.2)
Alkaline Phosphatase: 48 U/L (ref 39–117)
BUN: 12 mg/dL (ref 6–23)
CO2: 30 mEq/L (ref 19–32)
Calcium: 9.3 mg/dL (ref 8.4–10.5)
Chloride: 91 mEq/L — ABNORMAL LOW (ref 96–112)
Creatinine, Ser: 0.65 mg/dL (ref 0.40–1.20)
GFR: 85.44 mL/min (ref >60.00)
Glucose, Bld: 85 mg/dL (ref 70–99)
Potassium: 3.4 mEq/L — ABNORMAL LOW (ref 3.5–5.1)
Sodium: 127 mEq/L — ABNORMAL LOW (ref 135–145)
Total Bilirubin: 0.4 mg/dL (ref 0.2–1.2)
Total Protein: 6.9 g/dL (ref 6.0–8.3)

## 2022-05-06 LAB — MAGNESIUM: Magnesium: 1.8 mg/dL (ref 1.5–2.5)

## 2022-05-06 NOTE — Progress Notes (Signed)
Subjective:  Patient ID: Paula Griffith, female    DOB: 05-May-1946  Age: 76 y.o. MRN: [id number]  CC: Follow-up (3 month f/u)   HPI CHANTELL KUNKLER presents for a fall at church on 04/26/22. Pt went to ER Head/neck CT was OK. No LOC. L wrist X ray - no fx   Outpatient Medications Prior to Visit  Medication Sig Dispense Refill   albuterol (PROAIR HFA) 108 (90 Base) MCG/ACT inhaler Inhale 2 puffs into the lungs every 4 (four) hours as needed. For shortness of breath 18 g 5   anagrelide (AGRYLIN) 1 MG capsule Take 1 capsule (1 mg total) by mouth See admin instructions. TAKE 1 CAPSULE BY MOUTH DAILY EXCEPT 2 DAYS A WEEK (TUESDAYS AND THURSDAYS) DO NOT TAKE. 120 capsule 1   Ascorbic Acid (VITAMIN C) 1000 MG tablet Take 1,000 mg by mouth daily.     aspirin 325 MG EC tablet Take 325 mg by mouth daily after breakfast.     carvedilol (COREG) 25 MG tablet Take 1 tablet (25 mg total) by mouth 2 (two) times daily. (Patient taking differently: Take 50 mg by mouth daily.) 60 tablet 11   Cholecalciferol (VITAMIN D3) 2000 units TABS Take 2,000 Units by mouth daily.     Fluticasone-Salmeterol (ADVAIR) 250-50 MCG/DOSE AEPB Please specify directions, refills and quantity 1 each 11   gabapentin (NEURONTIN) 100 MG capsule TAKE 1 CAPSULE BY MOUTH THREE TIMES A DAY (Patient taking differently: Take 100 mg by mouth 3 (three) times daily.) 270 capsule 1   hydrochlorothiazide (MICROZIDE) 12.5 MG capsule Take 1 capsule (12.5 mg total) by mouth daily. 90 capsule 3   ketoconazole (NIZORAL) 2 % cream Apply 1 Application topically 2 (two) times daily. 60 g 1   Multiple Vitamins-Minerals (ADULT GUMMY PO) Take 2 tablets by mouth daily.     potassium chloride (KLOR-CON 10) 10 MEQ tablet Take 1 tablet (10 mEq total) by mouth daily. 90 tablet 3   trolamine salicylate (ASPERCREME) 10 % cream Apply 1 application topically 2 (two) times daily as needed for muscle pain.     urea (URE-NA) 15 g PACK oral packet Take 15 g (one  packet) by mouth daily. 30 packet 3   amLODipine (NORVASC) 5 MG tablet TAKE 1 TABLET BY MOUTH EVERY DAY (Patient not taking: Reported on 04/02/2022) 90 tablet 3   No facility-administered medications prior to visit.    ROS: Review of Systems  Constitutional:  Negative for activity change, appetite change, chills, fatigue and unexpected weight change.  HENT:  Negative for congestion, mouth sores and sinus pressure.   Eyes:  Negative for visual disturbance.  Respiratory:  Negative for cough and chest tightness.   Gastrointestinal:  Negative for abdominal pain and nausea.  Genitourinary:  Negative for difficulty urinating, frequency and vaginal pain.  Musculoskeletal:  Positive for arthralgias, back pain and gait problem.  Skin:  Negative for pallor and rash.  Neurological:  Negative for dizziness, tremors, syncope, weakness, light-headedness, numbness and headaches.  Psychiatric/Behavioral:  Negative for confusion, decreased concentration and sleep disturbance.     Objective:  BP (!) 168/60 (BP Location: Left Arm)   Pulse (!) 56   Temp 99.1 F (37.3 C) (Oral)   Ht '5\' 1"'$  (1.549 m)   Wt 129 lb 6.4 oz (58.7 kg)   SpO2 98%   BMI 24.45 kg/m   BP Readings from Last 3 Encounters:  05/06/22 (!) 168/60  04/26/22 (!) 174/77  04/23/22 (!) 166/82  Wt Readings from Last 3 Encounters:  05/06/22 129 lb 6.4 oz (58.7 kg)  04/26/22 130 lb 4.8 oz (59.1 kg)  04/23/22 130 lb 4.8 oz (59.1 kg)    Physical Exam Constitutional:      General: She is not in acute distress.    Appearance: Normal appearance. She is well-developed.  HENT:     Head: Normocephalic.     Right Ear: External ear normal.     Left Ear: External ear normal.     Nose: Nose normal.  Eyes:     General:        Right eye: No discharge.        Left eye: No discharge.     Conjunctiva/sclera: Conjunctivae normal.     Pupils: Pupils are equal, round, and reactive to light.  Neck:     Thyroid: No thyromegaly.      Vascular: No JVD.     Trachea: No tracheal deviation.  Cardiovascular:     Rate and Rhythm: Normal rate and regular rhythm.     Heart sounds: Normal heart sounds.  Pulmonary:     Effort: No respiratory distress.     Breath sounds: No stridor. No wheezing.  Abdominal:     General: Bowel sounds are normal. There is no distension.     Palpations: Abdomen is soft. There is no mass.     Tenderness: There is no abdominal tenderness. There is no guarding or rebound.  Musculoskeletal:        General: Tenderness present.     Cervical back: Normal range of motion and neck supple. No rigidity.  Lymphadenopathy:     Cervical: No cervical adenopathy.  Skin:    Findings: No erythema or rash.  Neurological:     Mental Status: She is oriented to person, place, and time.     Cranial Nerves: No cranial nerve deficit.     Motor: No abnormal muscle tone.     Coordination: Coordination normal.     Gait: Gait abnormal.     Deep Tendon Reflexes: Reflexes normal.  Psychiatric:        Behavior: Behavior normal.        Thought Content: Thought content normal.        Judgment: Judgment normal.    Using a cane L wrist in a brace - sensitive L hand is swollen and w/pain   Lab Results  Component Value Date   WBC 5.4 04/23/2022   HGB 9.6 (L) 04/23/2022   HCT 27.1 (L) 04/23/2022   PLT 218 04/23/2022   GLUCOSE 106 (H) 04/23/2022   CHOL 158 03/01/2018   TRIG 84.0 03/01/2018   HDL 47.10 03/01/2018   LDLCALC 94 03/01/2018   ALT 5 04/23/2022   AST 18 04/23/2022   NA 135 04/23/2022   K 2.9 (L) 04/23/2022   CL 96 (L) 04/23/2022   CREATININE 0.90 04/23/2022   BUN 12 04/23/2022   CO2 32 04/23/2022   TSH 1.12 04/02/2022   INR 0.97 12/29/2017   HGBA1C 5.4 04/28/2021    CT Head Wo Contrast  Result Date: 04/26/2022 CLINICAL DATA:  Fall. Facial injury. Lost tooth. Head and neck injury. EXAM: CT HEAD WITHOUT CONTRAST CT MAXILLOFACIAL WITHOUT CONTRAST CT CERVICAL SPINE WITHOUT CONTRAST TECHNIQUE:  Multidetector CT imaging of the head, cervical spine, and maxillofacial structures were performed using the standard protocol without intravenous contrast. Multiplanar CT image reconstructions of the cervical spine and maxillofacial structures were also generated. RADIATION DOSE REDUCTION: This exam was  performed according to the departmental dose-optimization program which includes automated exposure control, adjustment of the mA and/or kV according to patient size and/or use of iterative reconstruction technique. COMPARISON:  None Available. FINDINGS: CT HEAD FINDINGS Brain: No evidence of acute infarction, hemorrhage, hydrocephalus, extra-axial collection or mass lesion/mass effect. Vascular: No hyperdense vessel or unexpected calcification. Skull: Normal. Negative for fracture or focal lesion. Other: None. CT MAXILLOFACIAL FINDINGS Osseous: No fracture or mandibular dislocation. No destructive process. Orbits: Negative. No traumatic or inflammatory finding. Sinuses: Minor dependent secretions in the sphenoid sinuses. Sinuses otherwise clear. Clear mastoid air cells and middle ear cavities. Soft tissues: Negative. CT CERVICAL SPINE FINDINGS Alignment: Normal. Skull base and vertebrae: No acute fracture. No primary bone lesion or focal pathologic process. Soft tissues and spinal canal: No prevertebral fluid or swelling. No visible canal hematoma. Disc levels: Previous anterior cervical spine fusion, C3-C4, well-positioned anterior fixation plate and associated screws and mature bone graft material spanning the disc interspace. Mild loss of disc height at C6-C7. Minor disc bulging with endplate spurring, H8-N2 through C6-C7. No convincing disc herniation. There are significant bilateral facet degenerative changes. Upper chest: No acute or significant abnormality. Other: None. IMPRESSION: HEAD CT 1. No acute intracranial abnormalities. MAXILLOFACIAL CT 1. No fracture or acute finding. CERVICAL CT 1. No fracture or  acute finding. Electronically Signed   By: Lajean Manes M.D.   On: 04/26/2022 11:57   CT Cervical Spine Wo Contrast  Result Date: 04/26/2022 CLINICAL DATA:  Fall. Facial injury. Lost tooth. Head and neck injury. EXAM: CT HEAD WITHOUT CONTRAST CT MAXILLOFACIAL WITHOUT CONTRAST CT CERVICAL SPINE WITHOUT CONTRAST TECHNIQUE: Multidetector CT imaging of the head, cervical spine, and maxillofacial structures were performed using the standard protocol without intravenous contrast. Multiplanar CT image reconstructions of the cervical spine and maxillofacial structures were also generated. RADIATION DOSE REDUCTION: This exam was performed according to the departmental dose-optimization program which includes automated exposure control, adjustment of the mA and/or kV according to patient size and/or use of iterative reconstruction technique. COMPARISON:  None Available. FINDINGS: CT HEAD FINDINGS Brain: No evidence of acute infarction, hemorrhage, hydrocephalus, extra-axial collection or mass lesion/mass effect. Vascular: No hyperdense vessel or unexpected calcification. Skull: Normal. Negative for fracture or focal lesion. Other: None. CT MAXILLOFACIAL FINDINGS Osseous: No fracture or mandibular dislocation. No destructive process. Orbits: Negative. No traumatic or inflammatory finding. Sinuses: Minor dependent secretions in the sphenoid sinuses. Sinuses otherwise clear. Clear mastoid air cells and middle ear cavities. Soft tissues: Negative. CT CERVICAL SPINE FINDINGS Alignment: Normal. Skull base and vertebrae: No acute fracture. No primary bone lesion or focal pathologic process. Soft tissues and spinal canal: No prevertebral fluid or swelling. No visible canal hematoma. Disc levels: Previous anterior cervical spine fusion, C3-C4, well-positioned anterior fixation plate and associated screws and mature bone graft material spanning the disc interspace. Mild loss of disc height at C6-C7. Minor disc bulging with  endplate spurring, D7-O2 through C6-C7. No convincing disc herniation. There are significant bilateral facet degenerative changes. Upper chest: No acute or significant abnormality. Other: None. IMPRESSION: HEAD CT 1. No acute intracranial abnormalities. MAXILLOFACIAL CT 1. No fracture or acute finding. CERVICAL CT 1. No fracture or acute finding. Electronically Signed   By: Lajean Manes M.D.   On: 04/26/2022 11:57   CT Maxillofacial Wo Contrast  Result Date: 04/26/2022 CLINICAL DATA:  Fall. Facial injury. Lost tooth. Head and neck injury. EXAM: CT HEAD WITHOUT CONTRAST CT MAXILLOFACIAL WITHOUT CONTRAST CT CERVICAL SPINE WITHOUT CONTRAST TECHNIQUE: Multidetector  CT imaging of the head, cervical spine, and maxillofacial structures were performed using the standard protocol without intravenous contrast. Multiplanar CT image reconstructions of the cervical spine and maxillofacial structures were also generated. RADIATION DOSE REDUCTION: This exam was performed according to the departmental dose-optimization program which includes automated exposure control, adjustment of the mA and/or kV according to patient size and/or use of iterative reconstruction technique. COMPARISON:  None Available. FINDINGS: CT HEAD FINDINGS Brain: No evidence of acute infarction, hemorrhage, hydrocephalus, extra-axial collection or mass lesion/mass effect. Vascular: No hyperdense vessel or unexpected calcification. Skull: Normal. Negative for fracture or focal lesion. Other: None. CT MAXILLOFACIAL FINDINGS Osseous: No fracture or mandibular dislocation. No destructive process. Orbits: Negative. No traumatic or inflammatory finding. Sinuses: Minor dependent secretions in the sphenoid sinuses. Sinuses otherwise clear. Clear mastoid air cells and middle ear cavities. Soft tissues: Negative. CT CERVICAL SPINE FINDINGS Alignment: Normal. Skull base and vertebrae: No acute fracture. No primary bone lesion or focal pathologic process. Soft  tissues and spinal canal: No prevertebral fluid or swelling. No visible canal hematoma. Disc levels: Previous anterior cervical spine fusion, C3-C4, well-positioned anterior fixation plate and associated screws and mature bone graft material spanning the disc interspace. Mild loss of disc height at C6-C7. Minor disc bulging with endplate spurring, X5-Q0 through C6-C7. No convincing disc herniation. There are significant bilateral facet degenerative changes. Upper chest: No acute or significant abnormality. Other: None. IMPRESSION: HEAD CT 1. No acute intracranial abnormalities. MAXILLOFACIAL CT 1. No fracture or acute finding. CERVICAL CT 1. No fracture or acute finding. Electronically Signed   By: Lajean Manes M.D.   On: 04/26/2022 11:57   DG Wrist Complete Left  Result Date: 04/26/2022 CLINICAL DATA:  76 year old female status post fall on sidewalk. EXAM: LEFT WRIST - COMPLETE 3+ VIEW COMPARISON:  None Available. FINDINGS: Bulky degenerative osseous changes along the radial carpal bones with moderate to severe radiocarpal and 1st CMC joint space loss with sclerosis, osteophytosis. Distal radius and ulna appear intact. No acute carpal bone fracture identified. Visible metacarpals appear intact. IMPRESSION: Severe osteoarthritis along the radial aspect of the left wrist. No acute fracture or dislocation identified. Electronically Signed   By: Genevie Ann M.D.   On: 04/26/2022 11:51    Assessment & Plan:   Problem List Items Addressed This Visit     Essential hypertension    BP is elevated Cont on Rx The best we can do.      Relevant Orders   Comprehensive metabolic panel   Magnesium   Hypokalemia    Check CMET On KCl 10 meq daily May need to d/c HCTZ and start Maxzide Risks associated with low K treatment noncompliance were discussed. Compliance was encouraged.       Relevant Orders   Comprehensive metabolic panel   Magnesium   Fall - Primary    S/p a fall at church on 04/26/22. Pt went  to ER Head/neck CT was OK. No LOC. L wrist X ray - no fx Will watch      Relevant Orders   DG Hand Complete Left   Contusion of hand    L hand - worse X ray hand ACE wrap      Relevant Orders   DG Hand Complete Left      No orders of the defined types were placed in this encounter.     Follow-up: Return in about 6 weeks (around 06/17/2022) for a follow-up visit.  [name], MD

## 2022-05-06 NOTE — Assessment & Plan Note (Signed)
L hand - worse X ray hand ACE wrap

## 2022-05-06 NOTE — Assessment & Plan Note (Signed)
BP is elevated Cont on Rx The best we can do.

## 2022-05-06 NOTE — Assessment & Plan Note (Addendum)
Check CMET On KCl 10 meq daily May need to d/c HCTZ and start Maxzide Risks associated with low K treatment noncompliance were discussed. Compliance was encouraged.

## 2022-05-06 NOTE — Assessment & Plan Note (Signed)
S[place] a fall at church on 04[place]26/22. Pt went to ER Head/neck CT was OK. No LOC. L wrist X ray - no fx Will watch

## 2022-05-07 ENCOUNTER — Other Ambulatory Visit: Payer: Self-pay | Admitting: Internal Medicine

## 2022-05-07 MED ORDER — POTASSIUM CHLORIDE ER 10 MEQ PO TBCR: 10.0000 meq | EXTENDED_RELEASE_TABLET | Freq: Two times a day (BID) | ORAL | 3 refills | Status: DC

## 2022-06-01 ENCOUNTER — Encounter: Payer: Self-pay | Admitting: Primary Care

## 2022-06-17 ENCOUNTER — Ambulatory Visit: Payer: Medicare PPO | Admitting: Internal Medicine

## 2022-06-17 ENCOUNTER — Encounter: Payer: Self-pay | Admitting: Internal Medicine

## 2022-06-17 VITALS — BP 118/72 | HR 67 | Temp 98.0°F | Ht 61.0 in | Wt 130.0 lb

## 2022-06-17 DIAGNOSIS — R269 Unspecified abnormalities of gait and mobility: Secondary | ICD-10-CM

## 2022-06-17 DIAGNOSIS — I10 Essential (primary) hypertension: Secondary | ICD-10-CM

## 2022-06-17 DIAGNOSIS — D649 Anemia, unspecified: Secondary | ICD-10-CM | POA: Diagnosis not present

## 2022-06-17 NOTE — Assessment & Plan Note (Signed)
BP Readings from Last 3 Encounters:  06/17/22 118/72  05/06/22 (!) 168/60  04/26/22 (!) 174/77  Better. BP meds were reviewed Cont on Diovan HCT  320/25, Clonidine twice daily prn. Continue Coreg 25 mg bid, Norvasc, Catapress patch

## 2022-06-17 NOTE — Progress Notes (Signed)
Subjective:  Patient ID: Paula Griffith, female    DOB: May 24, 1946  Age: 76 y.o. MRN: [id number]  CC: Follow-up (Numbness in left hand)   HPI Paula Griffith presents for anemia, falls, OA, HTN  Outpatient Medications Prior to Visit  Medication Sig Dispense Refill   albuterol (PROAIR HFA) 108 (90 Base) MCG/ACT inhaler Inhale 2 puffs into the lungs every 4 (four) hours as needed. For shortness of breath 18 g 5   anagrelide (AGRYLIN) 1 MG capsule Take 1 capsule (1 mg total) by mouth See admin instructions. TAKE 1 CAPSULE BY MOUTH DAILY EXCEPT 2 DAYS A WEEK (TUESDAYS AND THURSDAYS) DO NOT TAKE. 120 capsule 1   Ascorbic Acid (VITAMIN C) 1000 MG tablet Take 1,000 mg by mouth daily.     aspirin 325 MG EC tablet Take 325 mg by mouth daily after breakfast.     carvedilol (COREG) 25 MG tablet Take 1 tablet (25 mg total) by mouth 2 (two) times daily. (Patient taking differently: Take 50 mg by mouth daily.) 60 tablet 11   Cholecalciferol (VITAMIN D3) 2000 units TABS Take 2,000 Units by mouth daily.     Fluticasone-Salmeterol (ADVAIR) 250-50 MCG/DOSE AEPB Please specify directions, refills and quantity 1 each 11   gabapentin (NEURONTIN) 100 MG capsule TAKE 1 CAPSULE BY MOUTH THREE TIMES A DAY (Patient taking differently: Take 100 mg by mouth 3 (three) times daily.) 270 capsule 1   hydrochlorothiazide (MICROZIDE) 12.5 MG capsule Take 1 capsule (12.5 mg total) by mouth daily. 90 capsule 3   ketoconazole (NIZORAL) 2 % cream Apply 1 Application topically 2 (two) times daily. 60 g 1   Multiple Vitamins-Minerals (ADULT GUMMY PO) Take 2 tablets by mouth daily.     potassium chloride (KLOR-CON 10) 10 MEQ tablet Take 1 tablet (10 mEq total) by mouth 2 (two) times daily. 180 tablet 3   trolamine salicylate (ASPERCREME) 10 % cream Apply 1 application topically 2 (two) times daily as needed for muscle pain.     urea (URE-NA) 15 g PACK oral packet Take 15 g (one packet) by mouth daily. 30 packet 3   amLODipine  (NORVASC) 5 MG tablet TAKE 1 TABLET BY MOUTH EVERY DAY (Patient not taking: Reported on 04/02/2022) 90 tablet 3   No facility-administered medications prior to visit.    ROS: Review of Systems  Constitutional:  Positive for fatigue. Negative for activity change, appetite change, chills and unexpected weight change.  HENT:  Negative for congestion, mouth sores and sinus pressure.   Eyes:  Negative for visual disturbance.  Respiratory:  Negative for cough and chest tightness.   Gastrointestinal:  Negative for abdominal pain and nausea.  Genitourinary:  Negative for difficulty urinating, frequency and vaginal pain.  Musculoskeletal:  Positive for arthralgias, back pain and gait problem.  Skin:  Negative for pallor and rash.  Neurological:  Positive for weakness. Negative for dizziness, tremors, numbness and headaches.  Psychiatric/Behavioral:  Negative for confusion, sleep disturbance and suicidal ideas.     Objective:  BP 118/72 (BP Location: Left Arm, Patient Position: Sitting, Cuff Size: Normal)   Pulse 67   Temp 98 F (36.7 C) (Oral)   Ht '5\' 1"'$  (1.549 m)   Wt 130 lb (59 kg)   SpO2 100%   BMI 24.56 kg/m   BP Readings from Last 3 Encounters:  06/17/22 118/72  05/06/22 (!) 168/60  04/26/22 (!) 174/77    Wt Readings from Last 3 Encounters:  06/17/22 130 lb (59 kg)  05/06/22 129 lb 6.4 oz (58.7 kg)  04/26/22 130 lb 4.8 oz (59.1 kg)    Physical Exam Constitutional:      General: She is not in acute distress.    Appearance: She is well-developed. She is obese.  HENT:     Head: Normocephalic.     Right Ear: External ear normal.     Left Ear: External ear normal.     Nose: Nose normal.  Eyes:     General:        Right eye: No discharge.        Left eye: No discharge.     Conjunctiva/sclera: Conjunctivae normal.     Pupils: Pupils are equal, round, and reactive to light.  Neck:     Thyroid: No thyromegaly.     Vascular: No JVD.     Trachea: No tracheal deviation.   Cardiovascular:     Rate and Rhythm: Normal rate and regular rhythm.     Heart sounds: Normal heart sounds.  Pulmonary:     Effort: No respiratory distress.     Breath sounds: No stridor. No wheezing.  Abdominal:     General: Bowel sounds are normal. There is no distension.     Palpations: Abdomen is soft. There is no mass.     Tenderness: There is no abdominal tenderness. There is no guarding or rebound.  Musculoskeletal:        General: No tenderness.     Cervical back: Normal range of motion and neck supple. No rigidity.  Lymphadenopathy:     Cervical: No cervical adenopathy.  Skin:    Findings: No erythema or rash.  Neurological:     Cranial Nerves: No cranial nerve deficit.     Motor: No abnormal muscle tone.     Coordination: Coordination normal.     Deep Tendon Reflexes: Reflexes normal.  Psychiatric:        Behavior: Behavior normal.        Thought Content: Thought content normal.        Judgment: Judgment normal.     Lab Results  Component Value Date   WBC 5.4 04/23/2022   HGB 9.6 (L) 04/23/2022   HCT 27.1 (L) 04/23/2022   PLT 218 04/23/2022   GLUCOSE 85 05/06/2022   CHOL 158 03/01/2018   TRIG 84.0 03/01/2018   HDL 47.10 03/01/2018   LDLCALC 94 03/01/2018   ALT 9 05/06/2022   AST 23 05/06/2022   NA 127 (L) 05/06/2022   K 3.4 (L) 05/06/2022   CL 91 (L) 05/06/2022   CREATININE 0.65 05/06/2022   BUN 12 05/06/2022   CO2 30 05/06/2022   TSH 1.12 04/02/2022   INR 0.97 12/29/2017   HGBA1C 5.4 04/28/2021    CT Head Wo Contrast  Result Date: 04/26/2022 CLINICAL DATA:  Fall. Facial injury. Lost tooth. Head and neck injury. EXAM: CT HEAD WITHOUT CONTRAST CT MAXILLOFACIAL WITHOUT CONTRAST CT CERVICAL SPINE WITHOUT CONTRAST TECHNIQUE: Multidetector CT imaging of the head, cervical spine, and maxillofacial structures were performed using the standard protocol without intravenous contrast. Multiplanar CT image reconstructions of the cervical spine and maxillofacial  structures were also generated. RADIATION DOSE REDUCTION: This exam was performed according to the departmental dose-optimization program which includes automated exposure control, adjustment of the mA and/or kV according to patient size and/or use of iterative reconstruction technique. COMPARISON:  None Available. FINDINGS: CT HEAD FINDINGS Brain: No evidence of acute infarction, hemorrhage, hydrocephalus, extra-axial collection or mass lesion/mass effect. Vascular: No  hyperdense vessel or unexpected calcification. Skull: Normal. Negative for fracture or focal lesion. Other: None. CT MAXILLOFACIAL FINDINGS Osseous: No fracture or mandibular dislocation. No destructive process. Orbits: Negative. No traumatic or inflammatory finding. Sinuses: Minor dependent secretions in the sphenoid sinuses. Sinuses otherwise clear. Clear mastoid air cells and middle ear cavities. Soft tissues: Negative. CT CERVICAL SPINE FINDINGS Alignment: Normal. Skull base and vertebrae: No acute fracture. No primary bone lesion or focal pathologic process. Soft tissues and spinal canal: No prevertebral fluid or swelling. No visible canal hematoma. Disc levels: Previous anterior cervical spine fusion, C3-C4, well-positioned anterior fixation plate and associated screws and mature bone graft material spanning the disc interspace. Mild loss of disc height at C6-C7. Minor disc bulging with endplate spurring, B0-F7 through C6-C7. No convincing disc herniation. There are significant bilateral facet degenerative changes. Upper chest: No acute or significant abnormality. Other: None. IMPRESSION: HEAD CT 1. No acute intracranial abnormalities. MAXILLOFACIAL CT 1. No fracture or acute finding. CERVICAL CT 1. No fracture or acute finding. Electronically Signed   By: Lajean Manes M.D.   On: 04/26/2022 11:57   CT Cervical Spine Wo Contrast  Result Date: 04/26/2022 CLINICAL DATA:  Fall. Facial injury. Lost tooth. Head and neck injury. EXAM: CT HEAD  WITHOUT CONTRAST CT MAXILLOFACIAL WITHOUT CONTRAST CT CERVICAL SPINE WITHOUT CONTRAST TECHNIQUE: Multidetector CT imaging of the head, cervical spine, and maxillofacial structures were performed using the standard protocol without intravenous contrast. Multiplanar CT image reconstructions of the cervical spine and maxillofacial structures were also generated. RADIATION DOSE REDUCTION: This exam was performed according to the departmental dose-optimization program which includes automated exposure control, adjustment of the mA and/or kV according to patient size and/or use of iterative reconstruction technique. COMPARISON:  None Available. FINDINGS: CT HEAD FINDINGS Brain: No evidence of acute infarction, hemorrhage, hydrocephalus, extra-axial collection or mass lesion/mass effect. Vascular: No hyperdense vessel or unexpected calcification. Skull: Normal. Negative for fracture or focal lesion. Other: None. CT MAXILLOFACIAL FINDINGS Osseous: No fracture or mandibular dislocation. No destructive process. Orbits: Negative. No traumatic or inflammatory finding. Sinuses: Minor dependent secretions in the sphenoid sinuses. Sinuses otherwise clear. Clear mastoid air cells and middle ear cavities. Soft tissues: Negative. CT CERVICAL SPINE FINDINGS Alignment: Normal. Skull base and vertebrae: No acute fracture. No primary bone lesion or focal pathologic process. Soft tissues and spinal canal: No prevertebral fluid or swelling. No visible canal hematoma. Disc levels: Previous anterior cervical spine fusion, C3-C4, well-positioned anterior fixation plate and associated screws and mature bone graft material spanning the disc interspace. Mild loss of disc height at C6-C7. Minor disc bulging with endplate spurring, P1-W2 through C6-C7. No convincing disc herniation. There are significant bilateral facet degenerative changes. Upper chest: No acute or significant abnormality. Other: None. IMPRESSION: HEAD CT 1. No acute intracranial  abnormalities. MAXILLOFACIAL CT 1. No fracture or acute finding. CERVICAL CT 1. No fracture or acute finding. Electronically Signed   By: Lajean Manes M.D.   On: 04/26/2022 11:57   CT Maxillofacial Wo Contrast  Result Date: 04/26/2022 CLINICAL DATA:  Fall. Facial injury. Lost tooth. Head and neck injury. EXAM: CT HEAD WITHOUT CONTRAST CT MAXILLOFACIAL WITHOUT CONTRAST CT CERVICAL SPINE WITHOUT CONTRAST TECHNIQUE: Multidetector CT imaging of the head, cervical spine, and maxillofacial structures were performed using the standard protocol without intravenous contrast. Multiplanar CT image reconstructions of the cervical spine and maxillofacial structures were also generated. RADIATION DOSE REDUCTION: This exam was performed according to the departmental dose-optimization program which includes automated exposure control, adjustment  of the mA and/or kV according to patient size and/or use of iterative reconstruction technique. COMPARISON:  None Available. FINDINGS: CT HEAD FINDINGS Brain: No evidence of acute infarction, hemorrhage, hydrocephalus, extra-axial collection or mass lesion/mass effect. Vascular: No hyperdense vessel or unexpected calcification. Skull: Normal. Negative for fracture or focal lesion. Other: None. CT MAXILLOFACIAL FINDINGS Osseous: No fracture or mandibular dislocation. No destructive process. Orbits: Negative. No traumatic or inflammatory finding. Sinuses: Minor dependent secretions in the sphenoid sinuses. Sinuses otherwise clear. Clear mastoid air cells and middle ear cavities. Soft tissues: Negative. CT CERVICAL SPINE FINDINGS Alignment: Normal. Skull base and vertebrae: No acute fracture. No primary bone lesion or focal pathologic process. Soft tissues and spinal canal: No prevertebral fluid or swelling. No visible canal hematoma. Disc levels: Previous anterior cervical spine fusion, C3-C4, well-positioned anterior fixation plate and associated screws and mature bone graft material  spanning the disc interspace. Mild loss of disc height at C6-C7. Minor disc bulging with endplate spurring, Z6-X0 through C6-C7. No convincing disc herniation. There are significant bilateral facet degenerative changes. Upper chest: No acute or significant abnormality. Other: None. IMPRESSION: HEAD CT 1. No acute intracranial abnormalities. MAXILLOFACIAL CT 1. No fracture or acute finding. CERVICAL CT 1. No fracture or acute finding. Electronically Signed   By: Lajean Manes M.D.   On: 04/26/2022 11:57   DG Wrist Complete Left  Result Date: 04/26/2022 CLINICAL DATA:  76 year old female status post fall on sidewalk. EXAM: LEFT WRIST - COMPLETE 3+ VIEW COMPARISON:  None Available. FINDINGS: Bulky degenerative osseous changes along the radial carpal bones with moderate to severe radiocarpal and 1st CMC joint space loss with sclerosis, osteophytosis. Distal radius and ulna appear intact. No acute carpal bone fracture identified. Visible metacarpals appear intact. IMPRESSION: Severe osteoarthritis along the radial aspect of the left wrist. No acute fracture or dislocation identified. Electronically Signed   By: Genevie Ann M.D.   On: 04/26/2022 11:51    Assessment & Plan:   Problem List Items Addressed This Visit     Gait disorder - Primary    No recent falls Using a cane      Essential hypertension    BP Readings from Last 3 Encounters:  06/17/22 118/72  05/06/22 (!) 168/60  04/26/22 (!) 174/77  Better. BP meds were reviewed Cont on Diovan HCT  320/25, Clonidine twice daily prn. Continue Coreg 25 mg bid, Norvasc, Catapress patch      Relevant Orders   CBC with Differential/Platelet   Comprehensive metabolic panel   Anemia    Anemic due to MPN. F/u w/Dr Burr Medico      Relevant Orders   CBC with Differential/Platelet      No orders of the defined types were placed in this encounter.     Follow-up: Return in about 3 months (around 09/16/2022) for a follow-up visit.  [name], MD

## 2022-06-17 NOTE — Assessment & Plan Note (Signed)
Anemic due to MPN. F/u w/Dr Burr Medico

## 2022-06-17 NOTE — Assessment & Plan Note (Signed)
No recent falls Using a cane

## 2022-06-30 ENCOUNTER — Telehealth: Payer: Self-pay | Admitting: Internal Medicine

## 2022-06-30 MED ORDER — HYDROCHLOROTHIAZIDE 12.5 MG PO CAPS: 12.5000 mg | ORAL_CAPSULE | Freq: Every day | ORAL | 3 refills | Status: DC

## 2022-06-30 NOTE — Telephone Encounter (Signed)
Caller & Relationship to patient: Self  Call back number: [phone]   Date of last office visit: 12.20.23  Date of next office visit: 3.20.24  Medication(s) to be refilled:  hydrochlorothiazide (MICROZIDE) 12.5 MG capsule    Preferred Pharmacy:   [id number]  Phone: [phone] Fax: [phone]

## 2022-06-30 NOTE — Telephone Encounter (Signed)
Rx has been sent to [facility]

## 2022-07-16 DIAGNOSIS — Z961 Presence of intraocular lens: Secondary | ICD-10-CM | POA: Diagnosis not present

## 2022-07-16 DIAGNOSIS — H02822 Cysts of right lower eyelid: Secondary | ICD-10-CM | POA: Diagnosis not present

## 2022-07-24 ENCOUNTER — Other Ambulatory Visit: Payer: Medicare PPO

## 2022-07-29 ENCOUNTER — Other Ambulatory Visit: Payer: Self-pay | Admitting: Internal Medicine

## 2022-09-01 ENCOUNTER — Other Ambulatory Visit: Payer: Self-pay

## 2022-09-01 ENCOUNTER — Ambulatory Visit: Payer: Medicare PPO | Admitting: Internal Medicine

## 2022-09-01 ENCOUNTER — Encounter (HOSPITAL_COMMUNITY): Payer: Self-pay | Admitting: Emergency Medicine

## 2022-09-01 ENCOUNTER — Inpatient Hospital Stay (HOSPITAL_COMMUNITY)
Admission: EM | Admit: 2022-09-01 | Discharge: 2022-09-06 | DRG: 305 | Disposition: A | Discharge status: Home / Self Care | Payer: Medicare PPO | Attending: Internal Medicine | Admitting: Internal Medicine

## 2022-09-01 ENCOUNTER — Emergency Department (HOSPITAL_COMMUNITY): Payer: Medicare PPO

## 2022-09-01 ENCOUNTER — Encounter: Payer: Self-pay | Admitting: Internal Medicine

## 2022-09-01 VITALS — BP 182/100 | HR 63 | Temp 98.6°F | Ht 61.0 in | Wt 131.0 lb

## 2022-09-01 DIAGNOSIS — H02402 Unspecified ptosis of left eyelid: Secondary | ICD-10-CM | POA: Diagnosis present

## 2022-09-01 DIAGNOSIS — E8809 Other disorders of plasma-protein metabolism, not elsewhere classified: Secondary | ICD-10-CM | POA: Diagnosis present

## 2022-09-01 DIAGNOSIS — N182 Chronic kidney disease, stage 2 (mild): Secondary | ICD-10-CM | POA: Diagnosis present

## 2022-09-01 DIAGNOSIS — Z79899 Other long term (current) drug therapy: Secondary | ICD-10-CM | POA: Diagnosis not present

## 2022-09-01 DIAGNOSIS — R531 Weakness: Secondary | ICD-10-CM

## 2022-09-01 DIAGNOSIS — D638 Anemia in other chronic diseases classified elsewhere: Secondary | ICD-10-CM | POA: Diagnosis present

## 2022-09-01 DIAGNOSIS — J45909 Unspecified asthma, uncomplicated: Secondary | ICD-10-CM | POA: Diagnosis present

## 2022-09-01 DIAGNOSIS — Z8249 Family history of ischemic heart disease and other diseases of the circulatory system: Secondary | ICD-10-CM

## 2022-09-01 DIAGNOSIS — Z887 Allergy status to serum and vaccine status: Secondary | ICD-10-CM

## 2022-09-01 DIAGNOSIS — E222 Syndrome of inappropriate secretion of antidiuretic hormone: Secondary | ICD-10-CM | POA: Diagnosis present

## 2022-09-01 DIAGNOSIS — Z8719 Personal history of other diseases of the digestive system: Secondary | ICD-10-CM | POA: Diagnosis not present

## 2022-09-01 DIAGNOSIS — D471 Chronic myeloproliferative disease: Secondary | ICD-10-CM | POA: Diagnosis present

## 2022-09-01 DIAGNOSIS — Z888 Allergy status to other drugs, medicaments and biological substances status: Secondary | ICD-10-CM | POA: Diagnosis not present

## 2022-09-01 DIAGNOSIS — Z981 Arthrodesis status: Secondary | ICD-10-CM

## 2022-09-01 DIAGNOSIS — Z90710 Acquired absence of both cervix and uterus: Secondary | ICD-10-CM | POA: Diagnosis not present

## 2022-09-01 DIAGNOSIS — T24011A Burn of unspecified degree of right thigh, initial encounter: Secondary | ICD-10-CM | POA: Diagnosis present

## 2022-09-01 DIAGNOSIS — T502X5A Adverse effect of carbonic-anhydrase inhibitors, benzothiadiazides and other diuretics, initial encounter: Secondary | ICD-10-CM | POA: Diagnosis present

## 2022-09-01 DIAGNOSIS — X088XXA Exposure to other specified smoke, fire and flames, initial encounter: Secondary | ICD-10-CM | POA: Diagnosis present

## 2022-09-01 DIAGNOSIS — Z856 Personal history of leukemia: Secondary | ICD-10-CM

## 2022-09-01 DIAGNOSIS — E876 Hypokalemia: Secondary | ICD-10-CM | POA: Diagnosis present

## 2022-09-01 DIAGNOSIS — Z885 Allergy status to narcotic agent status: Secondary | ICD-10-CM

## 2022-09-01 DIAGNOSIS — I6523 Occlusion and stenosis of bilateral carotid arteries: Secondary | ICD-10-CM | POA: Diagnosis not present

## 2022-09-01 DIAGNOSIS — T148XXA Other injury of unspecified body region, initial encounter: Secondary | ICD-10-CM | POA: Diagnosis present

## 2022-09-01 DIAGNOSIS — R29810 Facial weakness: Secondary | ICD-10-CM | POA: Diagnosis present

## 2022-09-01 DIAGNOSIS — M4316 Spondylolisthesis, lumbar region: Secondary | ICD-10-CM | POA: Diagnosis present

## 2022-09-01 DIAGNOSIS — I160 Hypertensive urgency: Secondary | ICD-10-CM | POA: Diagnosis not present

## 2022-09-01 DIAGNOSIS — E871 Hypo-osmolality and hyponatremia: Secondary | ICD-10-CM | POA: Diagnosis present

## 2022-09-01 DIAGNOSIS — R200 Anesthesia of skin: Secondary | ICD-10-CM | POA: Diagnosis not present

## 2022-09-01 DIAGNOSIS — Z7982 Long term (current) use of aspirin: Secondary | ICD-10-CM | POA: Diagnosis not present

## 2022-09-01 DIAGNOSIS — I129 Hypertensive chronic kidney disease with stage 1 through stage 4 chronic kidney disease, or unspecified chronic kidney disease: Secondary | ICD-10-CM | POA: Diagnosis not present

## 2022-09-01 LAB — RAPID URINE DRUG SCREEN, HOSP PERFORMED
Amphetamines: NOT DETECTED
Barbiturates: NOT DETECTED
Benzodiazepines: NOT DETECTED
Cocaine: NOT DETECTED
Opiates: NOT DETECTED
Tetrahydrocannabinol: NOT DETECTED

## 2022-09-01 LAB — ETHANOL: Alcohol, Ethyl (B): <10 mg/dL (ref <10)

## 2022-09-01 LAB — URINALYSIS, ROUTINE W REFLEX MICROSCOPIC
Bacteria, UA: NONE SEEN
Bilirubin Urine: NEGATIVE
Glucose, UA: NEGATIVE mg/dL
Hgb urine dipstick: NEGATIVE
Ketones, ur: NEGATIVE mg/dL
Leukocytes,Ua: NEGATIVE
Nitrite: NEGATIVE
Protein, ur: 30 mg/dL — AB
Specific Gravity, Urine: 1.004 — ABNORMAL LOW (ref 1.005–1.030)
pH: 7 (ref 5.0–8.0)

## 2022-09-01 LAB — COMPREHENSIVE METABOLIC PANEL
ALT: 8 U/L (ref 0–44)
AST: 29 U/L (ref 15–41)
Albumin: 3.8 g/dL (ref 3.5–5.0)
Alkaline Phosphatase: 43 U/L (ref 38–126)
Anion gap: 9 (ref 5–15)
BUN: 9 mg/dL (ref 8–23)
CO2: 25 mmol/L (ref 22–32)
Calcium: 8.8 mg/dL — ABNORMAL LOW (ref 8.9–10.3)
Chloride: 87 mmol/L — ABNORMAL LOW (ref 98–111)
Creatinine, Ser: 0.66 mg/dL (ref 0.44–1.00)
GFR, Estimated: >60 mL/min (ref >60)
Glucose, Bld: 99 mg/dL (ref 70–99)
Potassium: 3.3 mmol/L — ABNORMAL LOW (ref 3.5–5.1)
Sodium: 121 mmol/L — ABNORMAL LOW (ref 135–145)
Total Bilirubin: 0.6 mg/dL (ref 0.3–1.2)
Total Protein: 7 g/dL (ref 6.5–8.1)

## 2022-09-01 LAB — PROTIME-INR
INR: 1 (ref 0.8–1.2)
Prothrombin Time: 13.4 seconds (ref 11.4–15.2)

## 2022-09-01 LAB — DIFFERENTIAL
Abs Immature Granulocytes: 0.01 10*3/uL (ref 0.00–0.07)
Basophils Absolute: 0.1 10*3/uL (ref 0.0–0.1)
Basophils Relative: 1 %
Eosinophils Absolute: 0.1 10*3/uL (ref 0.0–0.5)
Eosinophils Relative: 2 %
Immature Granulocytes: 0 %
Lymphocytes Relative: 24 %
Lymphs Abs: 1.3 10*3/uL (ref 0.7–4.0)
Monocytes Absolute: 0.7 10*3/uL (ref 0.1–1.0)
Monocytes Relative: 12 %
Neutro Abs: 3.4 10*3/uL (ref 1.7–7.7)
Neutrophils Relative %: 61 %

## 2022-09-01 LAB — CBC
HCT: 24.7 % — ABNORMAL LOW (ref 36.0–46.0)
Hemoglobin: 9.1 g/dL — ABNORMAL LOW (ref 12.0–15.0)
MCH: 32.7 pg (ref 26.0–34.0)
MCHC: 36.8 g/dL — ABNORMAL HIGH (ref 30.0–36.0)
MCV: 88.8 fL (ref 80.0–100.0)
Platelets: 239 10*3/uL (ref 150–400)
RBC: 2.78 MIL/uL — ABNORMAL LOW (ref 3.87–5.11)
RDW: 10.9 % — ABNORMAL LOW (ref 11.5–15.5)
WBC: 5.5 10*3/uL (ref 4.0–10.5)
nRBC: 0 % (ref 0.0–0.2)

## 2022-09-01 LAB — TROPONIN I (HIGH SENSITIVITY)
Troponin I (High Sensitivity): 12 ng/L (ref <18)
Troponin I (High Sensitivity): 15 ng/L (ref <18)

## 2022-09-01 LAB — APTT: aPTT: 26 seconds (ref 24–36)

## 2022-09-01 MED ORDER — ACETAMINOPHEN 325 MG PO TABS
650.0000 mg | ORAL_TABLET | Freq: Four times a day (QID) | ORAL | Status: DC | PRN
  Administered 2022-09-02 – 2022-09-06 (×3): 650 mg via ORAL
  Filled 2022-09-01 (×3): qty 2

## 2022-09-01 MED ORDER — ACETAMINOPHEN 650 MG RE SUPP: 650.0000 mg | Freq: Four times a day (QID) | RECTAL | Status: DC | PRN

## 2022-09-01 MED ORDER — CARVEDILOL 25 MG PO TABS
25.0000 mg | ORAL_TABLET | Freq: Two times a day (BID) | ORAL | Status: DC
  Administered 2022-09-01 – 2022-09-06 (×10): 25 mg via ORAL
  Filled 2022-09-01 (×10): qty 1

## 2022-09-01 MED ORDER — ONDANSETRON HCL 4 MG/2ML IJ SOLN
4.0000 mg | Freq: Four times a day (QID) | INTRAMUSCULAR | Status: DC | PRN
  Administered 2022-09-02 – 2022-09-05 (×2): 4 mg via INTRAVENOUS
  Filled 2022-09-01 (×2): qty 2

## 2022-09-01 MED ORDER — SODIUM CHLORIDE 0.9 % IV BOLUS
500.0000 mL | Freq: Once | INTRAVENOUS | Status: AC
  Administered 2022-09-01: 500 mL via INTRAVENOUS

## 2022-09-01 MED ORDER — HYDRALAZINE HCL 20 MG/ML IJ SOLN
10.0000 mg | Freq: Once | INTRAMUSCULAR | Status: AC
  Administered 2022-09-01: 10 mg via INTRAVENOUS
  Filled 2022-09-01: qty 1

## 2022-09-01 MED ORDER — ONDANSETRON HCL 4 MG PO TABS: 4.0000 mg | ORAL_TABLET | Freq: Four times a day (QID) | ORAL | Status: DC | PRN

## 2022-09-01 MED ORDER — ENOXAPARIN SODIUM 40 MG/0.4ML IJ SOSY
40.0000 mg | PREFILLED_SYRINGE | INTRAMUSCULAR | Status: DC
  Administered 2022-09-02 – 2022-09-05 (×4): 40 mg via SUBCUTANEOUS
  Filled 2022-09-01 (×4): qty 0.4

## 2022-09-01 MED ORDER — HYDRALAZINE HCL 20 MG/ML IJ SOLN
5.0000 mg | INTRAMUSCULAR | Status: DC | PRN
  Administered 2022-09-02 – 2022-09-05 (×3): 10 mg via INTRAVENOUS
  Filled 2022-09-01 (×3): qty 1

## 2022-09-01 MED ORDER — POTASSIUM CHLORIDE CRYS ER 20 MEQ PO TBCR: 20.0000 meq | EXTENDED_RELEASE_TABLET | Freq: Once | ORAL | Status: DC

## 2022-09-01 NOTE — ED Notes (Signed)
Dr. Melina Copa notified of pt hypertension. Awaiting orders

## 2022-09-01 NOTE — ED Notes (Signed)
Lab called to add on troponin 
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
## 2022-09-01 NOTE — Assessment & Plan Note (Addendum)
BPs running Q000111Q systolic today prior to treatment in ED. MRI negative for acute stroke. Neurology thinks her intermittent neuro symptoms are secondary to HTN urgency, does not feel she needs transfer to [facility] or stroke work up per discussion with Dr. Rory Percy Cont home Amlodipine, coreg. Stop HCTZ given hyponatremia PRN IV hydralazine Daughter to bring in pts home meds to we can figure out what exactly she is taking and when. "Carotid artery PVD" listed on chart; however, looks like this was always <40% bilaterally in prior years. Most recent was in 2018 however So ill just go ahead and get carotid US to update this.

## 2022-09-01 NOTE — H&P (Incomplete)
History and Physical    Patient: Paula Griffith [id number] DOB: August 30, 1945 DOA: 09/01/2022 DOS: the patient was seen and examined on 09/01/2022 PCP: [name], [name], MD  Patient coming from: Home  Chief Complaint:  Chief Complaint  Patient presents with  . Hypertension   HPI: Paula Griffith is a 77 y.o. female with medical history significant of HTN, prior retinal infarct, MPN.   Review of Systems: {ROS_Text:26778} Past Medical History:  Diagnosis Date  . Allergic rhinitis   . Asthma   . Cervicalgia   . Colon polyps   . Complication of anesthesia   . Diverticulosis of colon   . Glucose intolerance (impaired glucose tolerance)   . HTN (hypertension)   . LBP (low back pain)   . Leukemia ([place])   . Osteoarthritis   . PONV (postoperative nausea and vomiting)   . PVD (peripheral vascular disease) ([place]) 2011   Right Carotid Dr Scot Dock  . 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 Spondylolisthesis of lumbar region   . Thrombocytopathy [facility]) 2011   Dr Ralene Ok   Past Surgical History:  Procedure Laterality Date  . ABDOMINAL HYSTERECTOMY  1989  . ANTERIOR CERVICAL DECOMP/DISCECTOMY FUSION N/A 09/22/2017   Procedure: ANTERIOR CERVICAL DECOMPRESSION AND FUSION CERVICAL THREE-FOUR.;  Surgeon: Eustace Moore, MD;  Location: [place];  Service: Neurosurgery;  Laterality: N/A;  anterior  . BACK SURGERY    . BREAST EXCISIONAL BIOPSY Left   . BREAST SURGERY     Left  . LUMBAR LAMINECTOMY/DECOMPRESSION MICRODISCECTOMY Left 01/06/2018   Procedure: laminectomy Lumbar five -Sacral one - left, Lumbar two-Lumbar three - bilateral;  Surgeon: Eustace Moore, MD;  Location: [place];  Service: Neurosurgery;  Laterality: Left;   Social History:  reports that she has never smoked. She has been exposed to tobacco smoke. She has never used smokeless tobacco. She reports that she does not drink alcohol and does not use drugs.  Allergies  Allergen Reactions  . Benazepril Cough    cough  . Amlodipine      Leg edema at 5 mg/d  . Fluad [Influenza Vac A&B Surf Ant Adj]     Very sick  . Other     BAND-AID CAUSE SKIN TEARS/BRUISES  . Codeine Nausea Only  . Tramadol Other (See Comments)    Dizzy, nauseated    Family History  Problem Relation Age of Onset  . Pancreatic cancer Mother   . Arthritis Mother        RA  . Early death Father 85       MI  . Coronary artery disease Other        Female 1st degree relative Female <60  . Lung cancer Other   . Diabetes Sister   . Breast cancer Maternal Grandmother   . Colon cancer Neg Hx   . Stomach cancer Neg Hx     Prior to Admission medications   Medication Sig Start Date End Date Taking? Authorizing Provider  albuterol (PROAIR HFA) 108 (90 Base) MCG/ACT inhaler Inhale 2 puffs into the lungs every 4 (four) hours as needed. For shortness of breath 08/07/19   [name], [name], MD  amLODipine (NORVASC) 5 MG tablet TAKE 1 TABLET BY MOUTH EVERY DAY 08/18/21   [name], [name], MD  anagrelide (AGRYLIN) 1 MG capsule Take 1 capsule (1 mg total) by mouth See admin instructions. TAKE 1 CAPSULE BY MOUTH DAILY EXCEPT 2 DAYS A WEEK (TUESDAYS AND THURSDAYS) DO NOT TAKE. 04/23/22  Truitt Merle, MD  Ascorbic Acid (VITAMIN C) 1000 MG tablet Take 1,000 mg by mouth daily.    [provider]  aspirin 325 MG EC tablet Take 325 mg by mouth daily after breakfast.    [provider]  carvedilol (COREG) 25 MG tablet Take 1 tablet (25 mg total) by mouth 2 (two) times daily. Patient taking differently: Take 50 mg by mouth daily. 09/29/21 09/29/22  [name], [name], MD  Cholecalciferol (VITAMIN D3) 2000 units TABS Take 2,000 Units by mouth daily.    [provider]  Fluticasone-Salmeterol (ADVAIR) 250-50 MCG/DOSE AEPB Please specify directions, refills and quantity 08/07/19   [name], [name], MD  gabapentin (NEURONTIN) 100 MG capsule TAKE 1 CAPSULE BY MOUTH THREE TIMES A DAY 07/29/22   [name], [name], MD  hydrochlorothiazide  (MICROZIDE) 12.5 MG capsule Take 1 capsule (12.5 mg total) by mouth daily. 06/30/22   [name], [name], MD  ketoconazole (NIZORAL) 2 % cream Apply 1 Application topically 2 (two) times daily. 02/03/22   Janith Lima, MD  Multiple Vitamins-Minerals (ADULT GUMMY PO) Take 2 tablets by mouth daily.    [provider]  potassium chloride (KLOR-CON 10) 10 MEQ tablet Take 1 tablet (10 mEq total) by mouth 2 (two) times daily. 05/07/22   [name], [name], MD  trolamine salicylate (ASPERCREME) 10 % cream Apply 1 application topically 2 (two) times daily as needed for muscle pain.    [provider]  urea (URE-NA) 15 g PACK oral packet Take 15 g (one packet) by mouth daily. 03/17/22   [name], [name], MD  losartan-hydrochlorothiazide (HYZAAR) 100-25 MG tablet Take 1 tablet by mouth daily. 04/03/19 06/28/19  [name], [name], MD    Physical Exam: Vitals:   09/01/22 2115 09/01/22 2130 09/01/22 2200 09/01/22 2225  BP: (!) 190/68 (!) 185/68 (!) 188/65 (!) 187/84  Pulse: 83 80 80   Resp: '19 15 16 16  '$ Temp:   97.9 F (36.6 C) 97.9 F (36.6 C)  TempSrc:   Oral Oral  SpO2: 98% 98% 98%   Weight:   61.6 kg   Height:   '5\' 3"'$  (1.6 m)    *** Data Reviewed: {Tip this will not be part of the note when signed- Document your independent interpretation of telemetry tracing, EKG, lab, Radiology test or any other diagnostic tests. Add any new diagnostic test ordered today. (Optional):26781} {Results:26384}  Assessment and Plan: * Hypertensive urgency BPs running Q000111Q systolic today prior to treatment in ED. MRI negative for acute stroke. Neurology thinks her intermittent neuro symptoms are secondary to HTN urgency, does not feel she needs transfer to [facility] [place] or stroke work up per discussion with Dr. Rory Percy Cont home Amlodipine, coreg. Stop HCTZ given hyponatremia PRN IV hydralazine Daughter to bring in pts home meds to we can figure out what exactly she is taking and when.  Acute  on chronic hyponatremia H/o same in past. Despite this history, looks like she is on HCTZ still? stop HCTZ Repeat BMP in AM to trend. Check urine sodium Will hold off on any specific IVF or dietary restriction for the moment however, as I suspect the acute component of this to be medication induced  Hypokalemia Replace K      Advance Care Planning:   Code Status: Full Code ***  Consults: ***  Family Communication: ***  Severity of Illness: {Observation/Inpatient:21159}  Author: Etta Quill., DO 09/01/2022 11:57 PM  For on call review [email or web address].

## 2022-09-01 NOTE — ED Notes (Signed)
Pt ambulatory to bathroom for urine sample. Pt tolerates ambulation with cane

## 2022-09-01 NOTE — ED Notes (Signed)
Pt states she is having intermittent chest pain, this started yesterday. Dr. Melina Copa notified

## 2022-09-01 NOTE — ED Provider Notes (Signed)
[place] [facility] AT [facility] Provider Note   CSN: [id number] Arrival date & time: 09/01/22  1551     History {Add pertinent medical, surgical, social history, OB history to HPI:1} Chief Complaint  Patient presents with   Hypertension    Paula Griffith is a 77 y.o. female.  She has a history of myelodysplastic syndrome hypertension.  She went to her primary care doctor today and they sent her here for further evaluation.  Has been noticing drooping of her left eye and some numbness in her left hand.  She said her vision has been blurry in her left eye.  Blood pressure was quite high at the doctor's office and she was sent over here for possible strokelike symptoms.  Patient tells me her following has been even dropped from 1 week.  She denies any headache chest pain shortness of breath abdominal pain vomiting diarrhea.  Daughter does not feel like she eats enough.  Does say she has been taking her blood pressure medications regularly.  The history is provided by the patient and a relative.  Neurologic Problem This is a new problem. The problem occurs constantly. The problem has not changed since onset.Pertinent negatives include no chest pain, no abdominal pain, no headaches and no shortness of breath. Nothing aggravates the symptoms. Nothing relieves the symptoms. She has tried nothing for the symptoms. The treatment provided no relief.       Home Medications Prior to Admission medications   Medication Sig Start Date End Date Taking? Authorizing Provider  albuterol (PROAIR HFA) 108 (90 Base) MCG/ACT inhaler Inhale 2 puffs into the lungs every 4 (four) hours as needed. For shortness of breath 08/07/19   [name], [name], MD  amLODipine (NORVASC) 5 MG tablet TAKE 1 TABLET BY MOUTH EVERY DAY 08/18/21   [name], [name], MD  anagrelide (AGRYLIN) 1 MG capsule Take 1 capsule (1 mg total) by mouth See admin instructions. TAKE 1 CAPSULE BY MOUTH DAILY EXCEPT 2  DAYS A WEEK (TUESDAYS AND THURSDAYS) DO NOT TAKE. 04/23/22   Truitt Merle, MD  Ascorbic Acid (VITAMIN C) 1000 MG tablet Take 1,000 mg by mouth daily.    [provider]  aspirin 325 MG EC tablet Take 325 mg by mouth daily after breakfast.    [provider]  carvedilol (COREG) 25 MG tablet Take 1 tablet (25 mg total) by mouth 2 (two) times daily. Patient taking differently: Take 50 mg by mouth daily. 09/29/21 09/29/22  [name], [name], MD  Cholecalciferol (VITAMIN D3) 2000 units TABS Take 2,000 Units by mouth daily.    [provider]  Fluticasone-Salmeterol (ADVAIR) 250-50 MCG/DOSE AEPB Please specify directions, refills and quantity 08/07/19   [name], [name], MD  gabapentin (NEURONTIN) 100 MG capsule TAKE 1 CAPSULE BY MOUTH THREE TIMES A DAY 07/29/22   [name], [name], MD  hydrochlorothiazide (MICROZIDE) 12.5 MG capsule Take 1 capsule (12.5 mg total) by mouth daily. 06/30/22   [name], [name], MD  ketoconazole (NIZORAL) 2 % cream Apply 1 Application topically 2 (two) times daily. 02/03/22   Janith Lima, MD  Multiple Vitamins-Minerals (ADULT GUMMY PO) Take 2 tablets by mouth daily.    [provider]  potassium chloride (KLOR-CON 10) 10 MEQ tablet Take 1 tablet (10 mEq total) by mouth 2 (two) times daily. 05/07/22   [name], [name], MD  trolamine salicylate (ASPERCREME) 10 % cream Apply 1 application topically 2 (two) times daily as needed for muscle  pain.    [provider]  urea (URE-NA) 15 g PACK oral packet Take 15 g (one packet) by mouth daily. 03/17/22   [name], [name], MD  losartan-hydrochlorothiazide (HYZAAR) 100-25 MG tablet Take 1 tablet by mouth daily. 04/03/19 06/28/19  [name], [name], MD      Allergies    Benazepril, Amlodipine, Fluad [influenza vac a&b surf ant adj], Other, Codeine, and Tramadol    Review of Systems   Review of Systems  Constitutional:  Negative for fever.  HENT:  Negative for sore throat.    Eyes:  Positive for visual disturbance. Negative for pain.  Respiratory:  Negative for shortness of breath.   Cardiovascular:  Negative for chest pain.  Gastrointestinal:  Negative for abdominal pain.  Genitourinary:  Negative for dysuria.  Skin:  Negative for rash.  Neurological:  Positive for numbness. Negative for syncope, speech difficulty, weakness and headaches.    Physical Exam Updated Vital Signs BP (!) 172/99   Pulse 64   Temp 98.3 F (36.8 C) (Oral)   Resp 19   Ht '5\' 1"'$  (1.549 m)   Wt 59.4 kg   SpO2 100%   BMI 24.75 kg/m  Physical Exam Vitals and nursing note reviewed.  Constitutional:      General: She is not in acute distress.    Appearance: Normal appearance. She is well-developed.  HENT:     Head: Normocephalic and atraumatic.  Eyes:     Conjunctiva/sclera: Conjunctivae normal.  Cardiovascular:     Rate and Rhythm: Normal rate and regular rhythm.     Heart sounds: No murmur heard. Pulmonary:     Effort: Pulmonary effort is normal. No respiratory distress.     Breath sounds: Normal breath sounds.  Abdominal:     Palpations: Abdomen is soft.     Tenderness: There is no abdominal tenderness. There is no guarding or rebound.  Musculoskeletal:        General: No deformity.     Cervical back: Neck supple.     Right lower leg: No edema.     Left lower leg: No edema.  Skin:    General: Skin is warm and dry.     Capillary Refill: Capillary refill takes less than 2 seconds.  Neurological:     Mental Status: She is alert and oriented to person, place, and time.     Cranial Nerves: No cranial nerve deficit.     Sensory: Sensory deficit present.     Motor: No weakness.     Comments: Subjective decrease sensation in left palm     ED Results / Procedures / Treatments   Labs (all labs ordered are listed, but only abnormal results are displayed) Labs Reviewed - No data to display  EKG EKG Interpretation  Date/Time:  Tuesday September 01 2022 16:07:26  EST Ventricular Rate:  66 PR Interval:  206 QRS Duration: 90 QT Interval:  441 QTC Calculation: 463 R Axis:   39 Text Interpretation: Sinus rhythm No significant change since prior 7/23 Confirmed by Aletta Edouard [phone]) on 09/01/2022 4:20:40 PM  Radiology No results found.  Procedures Procedures  {Document cardiac monitor, telemetry assessment procedure when appropriate:1}  Medications Ordered in ED Medications - No data to display  ED Course/ Medical Decision Making/ A&P   {   Click here for ABCD2, HEART and other calculatorsREFRESH Note before signing :1}  Medical Decision Making Amount and/or Complexity of Data Reviewed Labs: ordered. Radiology: ordered.   This patient complains of ***; this involves an extensive number of treatment Options and is a complaint that carries with it a high risk of complications and morbidity. The differential includes ***  I ordered, reviewed and interpreted labs, which included *** I ordered medication *** and reviewed PMP when indicated. I ordered imaging studies which included *** and I independently    visualized and interpreted imaging which showed *** Additional history obtained from *** Previous records obtained and reviewed *** I consulted *** and discussed lab and imaging findings and discussed disposition.  Cardiac monitoring reviewed, *** Social determinants considered, *** Critical Interventions: ***  After the interventions stated above, I reevaluated the patient and found *** Admission and further testing considered, ***   {Document critical care time when appropriate:1} {Document review of labs and clinical decision tools ie heart score, Chads2Vasc2 etc:1}  {Document your independent review of radiology images, and any outside records:1} {Document your discussion with family members, caretakers, and with consultants:1} {Document social determinants of health affecting pt's care:1} {Document  your decision making why or why not admission, treatments were needed:1} Final Clinical Impression(s) / ED Diagnoses Final diagnoses:  None    Rx / DC Orders ED Discharge Orders     None

## 2022-09-01 NOTE — Patient Instructions (Addendum)
I would recommend to go to the ER to get checked for stroke

## 2022-09-01 NOTE — Assessment & Plan Note (Signed)
Replace K+

## 2022-09-01 NOTE — Progress Notes (Signed)
   Subjective:   Patient ID: Paula Griffith, female    DOB: [birth year], 77 y.o.   MRN: [id number]  HPI The patient is a 77 YO female coming in for swelling/drooping left eye and weakness left side. Daughter went to patient's house yesterday and noticed left eye drooping and facial drooping with mouth not moving with speech. Patient also admits to left sided weakness and numbness same time. Some intermittent drooping of eye and weakness over the last 1 week per patient she did not tell daughters and lives alone. Patient has high BP and has been taking medications as prescribed without change. They were concerned about stroke yesterday but did not take her to ER.  PMH, [facility], social history reviewed and updated  Review of Systems  Constitutional: Negative.   HENT: Negative.    Eyes:  Positive for visual disturbance.  Respiratory:  Negative for cough, chest tightness and shortness of breath.   Cardiovascular:  Negative for chest pain, palpitations and leg swelling.  Gastrointestinal:  Negative for abdominal distention, abdominal pain, constipation, diarrhea, nausea and vomiting.  Musculoskeletal: Negative.   Skin: Negative.   Neurological:  Positive for weakness and numbness.  Psychiatric/Behavioral: Negative.      Objective:  Physical Exam Constitutional:      Appearance: She is well-developed.  HENT:     Head: Normocephalic and atraumatic.  Cardiovascular:     Rate and Rhythm: Normal rate and regular rhythm.  Pulmonary:     Effort: Pulmonary effort is normal. No respiratory distress.     Breath sounds: Normal breath sounds. No wheezing or rales.  Abdominal:     General: Bowel sounds are normal. There is no distension.     Palpations: Abdomen is soft.     Tenderness: There is no abdominal tenderness. There is no rebound.  Musculoskeletal:     Cervical back: Normal range of motion.  Skin:    General: Skin is warm and dry.  Neurological:     Mental Status: She is alert and oriented  to person, place, and time.     Sensory: Sensory deficit present.     Coordination: Coordination abnormal.     Comments: 3/5 strength LE bilateral, 3/5 strength UE bilateral. Decreased sensation to fine touch left LE and UE and face. EOM intact some decreased wrinkle left face subtle.      Vitals:   09/01/22 1507 09/01/22 1510  BP: (!) 184/100 (!) 182/100  Pulse: 63   Temp: 98.6 F (37 C)   TempSrc: Oral   SpO2: 97%   Weight: 131 lb (59.4 kg)   Height: '5\' 1"'$  (1.549 m)     Assessment & Plan:  Visit time 25 minutes in face to face communication with patient and coordination of care, additional 5 minutes spent in record review, coordination or care, ordering tests, communicating/referring to other healthcare professionals, documenting in medical records all on the same day of the visit for total time 30 minutes spent on the visit.

## 2022-09-01 NOTE — ED Notes (Signed)
Pt in bathroom when called to triage

## 2022-09-01 NOTE — ED Notes (Addendum)
Pt asking for food. Dr. Melina Copa states pt can eat

## 2022-09-01 NOTE — ED Notes (Signed)
ED TO INPATIENT HANDOFF REPORT  ED Nurse Name and Phone #: Renette Butters Name/Age/Gender Paula Griffith 77 y.o. female Room/Bed: WA18/WA18  Code Status   Code Status: Prior  Home/SNF/Other Home Patient oriented to: self, place, time, and situation Is this baseline? Yes   Triage Complete: Triage complete  Chief Complaint Hypertensive urgency [I16.0]  Triage Note Patient arrives in wheelchair by POV with daughter. Family states yesterday when they saw patient her left eye was drooping and having trouble keeping it open, family took pt to PCP and sent here for further evaluation for HTN. Patient states her left hand has been numb intermittently over the past few months.    Allergies Allergies  Allergen Reactions   Benazepril Cough    cough   Amlodipine     Leg edema at 5 mg/d   Fluad [Influenza Vac A&B Surf Ant Adj]     Very sick   Other     BAND-AID CAUSE SKIN TEARS/BRUISES   Codeine Nausea Only   Tramadol Other (See Comments)    Dizzy, nauseated    Level of Care/Admitting Diagnosis ED Disposition     ED Disposition  Admit   Condition  --   Comment  Hospital Area: [facility] [[id number]]  Level of Care: Progressive [102]  Admit to Progressive based on following criteria: CARDIOVASCULAR & THORACIC of moderate stability with acute coronary syndrome symptoms/low risk myocardial infarction/hypertensive urgency/arrhythmias/heart failure potentially compromising stability and stable post cardiovascular intervention patients.  May place patient in observation at [facility] or [facility] if equivalent level of care is available:: No  Covid Evaluation: Asymptomatic - no recent exposure (last 10 days) testing not required  Diagnosis: Hypertensive urgency [id number]  Admitting Physician: Etta Quill [id number]  Attending Physician: Etta Quill [[id number]]          B Medical/Surgery History Past Medical History:  Diagnosis Date   Allergic rhinitis     Asthma    Cervicalgia    Colon polyps    Complication of anesthesia    Diverticulosis of colon    Glucose intolerance (impaired glucose tolerance)    HTN (hypertension)    LBP (low back pain)    Leukemia (HCC)    Osteoarthritis    PONV (postoperative nausea and vomiting)    PVD (peripheral vascular disease) ([place] [facility]) 2011   Right Carotid Dr Scot Dock   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   Spondylolisthesis of lumbar region    Thrombocytopathy [facility]) 2011   Dr Ralene Ok   Past Surgical History:  Procedure Laterality Date   [place] DECOMP/DISCECTOMY FUSION N/A 09/22/2017   Procedure: ANTERIOR CERVICAL DECOMPRESSION AND FUSION CERVICAL THREE-FOUR.;  Surgeon: Eustace Moore, MD;  Location: [place];  Service: Neurosurgery;  Laterality: N/A;  anterior   BACK SURGERY     BREAST EXCISIONAL BIOPSY Left    BREAST SURGERY     Left   LUMBAR LAMINECTOMY/DECOMPRESSION MICRODISCECTOMY Left 01/06/2018   Procedure: laminectomy Lumbar five -Sacral one - left, Lumbar two-Lumbar three - bilateral;  Surgeon: Eustace Moore, MD;  Location: [place];  Service: Neurosurgery;  Laterality: Left;     A IV Location/Drains/Wounds Patient Lines/Drains/Airways Status     Active Line/Drains/Airways     Name Placement date Placement time Site Days   Peripheral IV 09/01/22 22 G Anterior;Right Forearm 09/01/22  1640  Forearm  less than 1   Incision (Closed) 09/22/17 Neck Other (Comment) 09/22/17  1307  -- 1805   Incision (Closed) 01/06/18 Back Other (Comment) 01/06/18  1304  -- 1699            Intake/Output Last 24 hours  Intake/Output Summary (Last 24 hours) at 09/01/2022 2138 Last data filed at 09/01/2022 1925 Gross per 24 hour  Intake 500 ml  Output 1200 ml  Net -700 ml    Labs/Imaging Results for orders placed or performed during the hospital encounter of 09/01/22 (from the past 48 hour(s))  Ethanol     Status: None   Collection Time: 09/01/22  4:37 PM  Result Value  Ref Range   Alcohol, Ethyl (B) <10 <10 mg/dL    Comment: (NOTE) Lowest detectable limit for serum alcohol is 10 mg/dL.  For medical purposes only. Performed at [facility], [place] [address], [place], [place] [address]   Protime-INR     Status: None   Collection Time: 09/01/22  4:37 PM  Result Value Ref Range   Prothrombin Time 13.4 11.4 - 15.2 seconds   INR 1.0 0.8 - 1.2    Comment: (NOTE) INR goal varies based on device and disease states. Performed at [facility], [place] [address] [place], [place] [address]   APTT     Status: None   Collection Time: 09/01/22  4:37 PM  Result Value Ref Range   aPTT 26 24 - 36 seconds    Comment: Performed at [facility], [place] [address], [place], [place] [address]  CBC     Status: Abnormal   Collection Time: 09/01/22  4:37 PM  Result Value Ref Range   WBC 5.5 4.0 - 10.5 K/uL   RBC 2.78 (L) 3.87 - 5.11 MIL/uL   Hemoglobin 9.1 (L) 12.0 - 15.0 g/dL   HCT 24.7 (L) 36.0 - 46.0 %   MCV 88.8 80.0 - 100.0 fL   MCH 32.7 26.0 - 34.0 pg   MCHC 36.8 (H) 30.0 - 36.0 g/dL   RDW 10.9 (L) 11.5 - 15.5 %   Platelets 239 150 - 400 K/uL   nRBC 0.0 0.0 - 0.2 %    Comment: Performed at [facility], [place] [address], [place], [place] [address]  Differential     Status: None   Collection Time: 09/01/22  4:37 PM  Result Value Ref Range   Neutrophils Relative % 61 %   Neutro Abs 3.4 1.7 - 7.7 K/uL   Lymphocytes Relative 24 %   Lymphs Abs 1.3 0.7 - 4.0 K/uL   Monocytes Relative 12 %   Monocytes Absolute 0.7 0.1 - 1.0 K/uL   Eosinophils Relative 2 %   Eosinophils Absolute 0.1 0.0 - 0.5 K/uL   Basophils Relative 1 %   Basophils Absolute 0.1 0.0 - 0.1 K/uL   Immature Granulocytes 0 %   Abs Immature Granulocytes 0.01 0.00 - 0.07 K/uL    Comment: Performed at [facility], [place] [address], [place],  [address]  Comprehensive metabolic panel     Status: Abnormal    Collection Time: 09/01/22  4:37 PM  Result Value Ref Range   Sodium 121 (L) 135 - 145 mmol/L   Potassium 3.3 (L) 3.5 - 5.1 mmol/L   Chloride 87 (L) 98 - 111 mmol/L   CO2 25 22 - 32 mmol/L   Glucose, Bld 99 70 - 99 mg/dL    Comment: Glucose reference range applies only to samples taken after fasting for at least 8 hours.  BUN 9 8 - 23 mg/dL   Creatinine, Ser 0.66 0.44 - 1.00 mg/dL   Calcium 8.8 (L) 8.9 - 10.3 mg/dL   Total Protein 7.0 6.5 - 8.1 g/dL   Albumin 3.8 3.5 - 5.0 g/dL   AST 29 15 - 41 U/L   ALT 8 0 - 44 U/L   Alkaline Phosphatase 43 38 - 126 U/L   Total Bilirubin 0.6 0.3 - 1.2 mg/dL   GFR, Estimated >60 >60 mL/min    Comment: (NOTE) Calculated using the CKD-EPI Creatinine Equation (2021)    Anion gap 9 5 - 15    Comment: Performed at [facility], [place] [address], [place], [place] [address]  Troponin I (High Sensitivity)     Status: None   Collection Time: 09/01/22  4:37 PM  Result Value Ref Range   Troponin I (High Sensitivity) 12 <18 ng/L    Comment: (NOTE) Elevated high sensitivity troponin I (hsTnI) values and significant  changes across serial measurements may suggest ACS but many other  chronic and acute conditions are known to elevate hsTnI results.  Refer to the "Links" section for chest pain algorithms and additional  guidance. Performed at [facility], [place] [address] [place], [place] [address]   Urine rapid drug screen (hosp performed)     Status: None   Collection Time: 09/01/22  4:51 PM  Result Value Ref Range   Opiates NONE DETECTED NONE DETECTED   Cocaine NONE DETECTED NONE DETECTED   Benzodiazepines NONE DETECTED NONE DETECTED   Amphetamines NONE DETECTED NONE DETECTED   Tetrahydrocannabinol NONE DETECTED NONE DETECTED   Barbiturates NONE DETECTED NONE DETECTED    Comment: (NOTE) DRUG SCREEN FOR MEDICAL PURPOSES ONLY.  IF CONFIRMATION IS NEEDED FOR ANY PURPOSE, NOTIFY LAB WITHIN 5 DAYS.  LOWEST  DETECTABLE LIMITS FOR URINE DRUG SCREEN Drug Class                     Cutoff (ng/mL) Amphetamine and metabolites    1000 Barbiturate and metabolites    200 Benzodiazepine                 200 Opiates and metabolites        300 Cocaine and metabolites        300 THC                            50 Performed at [facility], [place] [address], [place], [place] [address]   Urinalysis, Routine w reflex microscopic -Urine, Clean Catch     Status: Abnormal   Collection Time: 09/01/22  4:51 PM  Result Value Ref Range   Color, Urine STRAW (A) YELLOW   APPearance CLEAR CLEAR   Specific Gravity, Urine 1.004 (L) 1.005 - 1.030   pH 7.0 5.0 - 8.0   Glucose, UA NEGATIVE NEGATIVE mg/dL   Hgb urine dipstick NEGATIVE NEGATIVE   Bilirubin Urine NEGATIVE NEGATIVE   Ketones, ur NEGATIVE NEGATIVE mg/dL   Protein, ur 30 (A) NEGATIVE mg/dL   Nitrite NEGATIVE NEGATIVE   Leukocytes,Ua NEGATIVE NEGATIVE   RBC / HPF 0-5 0 - 5 RBC/hpf   WBC, UA 0-5 0 - 5 WBC/hpf   Bacteria, UA NONE SEEN NONE SEEN   Squamous Epithelial / HPF 0-5 0 - 5 /HPF    Comment: Performed at [facility], [place] [address] [place], [place] [address]  Troponin I (High  Sensitivity)     Status: None   Collection Time: 09/01/22  7:36 PM  Result Value Ref Range   Troponin I (High Sensitivity) 15 <18 ng/L    Comment: (NOTE) Elevated high sensitivity troponin I (hsTnI) values and significant  changes across serial measurements may suggest ACS but many other  chronic and acute conditions are known to elevate hsTnI results.  Refer to the "Links" section for chest pain algorithms and additional  guidance. Performed at [facility], [place] [address] [place], [place] [address]    MR BRAIN WO CONTRAST  Result Date: 09/01/2022 CLINICAL DATA:  Neuro deficit with acute stroke suspected. Left hand numbness. Facial droop EXAM: MRI HEAD WITHOUT CONTRAST TECHNIQUE: Multiplanar, multiecho pulse  sequences of the brain and surrounding structures were obtained without intravenous contrast. COMPARISON:  04/26/2022 head CT FINDINGS: Brain: No acute infarction, hemorrhage, hydrocephalus, extra-axial collection or mass lesion. Patchy FLAIR hyperintensity in the cerebral white matter attributed to chronic small vessel ischemia. There is cortical to juxtacortical remote insult along the right frontal parietal convexity. Brain volume is normal for age. Vascular: Normal flow voids. Skull and upper cervical spine: Cervical fusion at C3-4. No evidence of bone lesion. Sinuses/Orbits: Bilateral cataract resection. Secretions seen in the sphenoid sinuses. IMPRESSION: 1. No acute finding including infarct. 2. Moderate chronic small vessel ischemia in the cerebral white matter. Electronically Signed   By: Jorje Guild M.D.   On: 09/01/2022 20:29    Pending Labs Unresulted Labs (From admission, onward)    None       Vitals/Pain Today's Vitals   09/01/22 2045 09/01/22 2100 09/01/22 2115 09/01/22 2130  BP: (!) 201/77 (!) 190/76 (!) 190/68 (!) 185/68  Pulse: 71 79 83 80  Resp: '17 16 19 15  '$ Temp: 98 F (36.7 C)     TempSrc: Oral     SpO2: 100% 99% 98% 98%  Weight:      Height:      PainSc:        Isolation Precautions No active isolations  Medications Medications  hydrALAZINE (APRESOLINE) injection 5-10 mg (has no administration in time range)  sodium chloride 0.9 % bolus 500 mL (0 mLs Intravenous Stopped 09/01/22 1925)  hydrALAZINE (APRESOLINE) injection 10 mg (10 mg Intravenous Given 09/01/22 1734)  hydrALAZINE (APRESOLINE) injection 10 mg (10 mg Intravenous Given 09/01/22 2052)    Mobility walks with device     Focused Assessments     R Recommendations: See Admitting Provider Note  Report given to:   Additional Notes:

## 2022-09-01 NOTE — ED Notes (Signed)
Pt to MRI via stretcher.

## 2022-09-01 NOTE — Assessment & Plan Note (Signed)
Weakness and some decrease to fine touch left side LE, UE, face. Started up to 1 week ago with acute worsening 1 day ago. She is outside window for stroke. Prior hyponatremia which was worse on last labs and could be metabolic cause. Advised to go to ER for MRI brain and labs to rule out causes. She also has moderately elevated BP on normal meds which could be related. Counseled in future if any signs or symptoms of stroke go to ER immediately.

## 2022-09-01 NOTE — ED Notes (Signed)
Pt ambulatory to bathroom with standby assist and cane

## 2022-09-01 NOTE — ED Notes (Signed)
Pt instructed to stay bedrest due to increase in blood pressures after ambulating

## 2022-09-01 NOTE — Assessment & Plan Note (Addendum)
H/o same in past. Despite this history, looks like she is on HCTZ still? stop HCTZ Repeat BMP in AM to trend. Check urine sodium Will hold off on any specific IVF or dietary restriction for the moment however, as I suspect the acute component of this to be medication induced

## 2022-09-01 NOTE — ED Triage Notes (Signed)
Patient arrives in wheelchair by POV with daughter. Family states yesterday when they saw patient her left eye was drooping and having trouble keeping it open, family took pt to PCP and sent here for further evaluation for HTN. Patient states her left hand has been numb intermittently over the past few months.

## 2022-09-02 ENCOUNTER — Observation Stay (HOSPITAL_BASED_OUTPATIENT_CLINIC_OR_DEPARTMENT_OTHER): Payer: Medicare PPO

## 2022-09-02 DIAGNOSIS — I6523 Occlusion and stenosis of bilateral carotid arteries: Secondary | ICD-10-CM | POA: Diagnosis not present

## 2022-09-02 DIAGNOSIS — E876 Hypokalemia: Secondary | ICD-10-CM | POA: Diagnosis not present

## 2022-09-02 DIAGNOSIS — D471 Chronic myeloproliferative disease: Secondary | ICD-10-CM | POA: Diagnosis not present

## 2022-09-02 DIAGNOSIS — T148XXA Other injury of unspecified body region, initial encounter: Secondary | ICD-10-CM | POA: Diagnosis present

## 2022-09-02 DIAGNOSIS — E871 Hypo-osmolality and hyponatremia: Secondary | ICD-10-CM | POA: Diagnosis not present

## 2022-09-02 DIAGNOSIS — I160 Hypertensive urgency: Secondary | ICD-10-CM | POA: Diagnosis not present

## 2022-09-02 LAB — CBC
HCT: 24.5 % — ABNORMAL LOW (ref 36.0–46.0)
Hemoglobin: 8.7 g/dL — ABNORMAL LOW (ref 12.0–15.0)
MCH: 31.9 pg (ref 26.0–34.0)
MCHC: 35.5 g/dL (ref 30.0–36.0)
MCV: 89.7 fL (ref 80.0–100.0)
Platelets: 242 10*3/uL (ref 150–400)
RBC: 2.73 MIL/uL — ABNORMAL LOW (ref 3.87–5.11)
RDW: 11.2 % — ABNORMAL LOW (ref 11.5–15.5)
WBC: 6.5 10*3/uL (ref 4.0–10.5)
nRBC: 0 % (ref 0.0–0.2)

## 2022-09-02 LAB — OSMOLALITY: Osmolality: 256 mOsm/kg — ABNORMAL LOW (ref 275–295)

## 2022-09-02 LAB — BASIC METABOLIC PANEL
Anion gap: 8 (ref 5–15)
BUN: 8 mg/dL (ref 8–23)
CO2: 25 mmol/L (ref 22–32)
Calcium: 8.5 mg/dL — ABNORMAL LOW (ref 8.9–10.3)
Chloride: 88 mmol/L — ABNORMAL LOW (ref 98–111)
Creatinine, Ser: 0.65 mg/dL (ref 0.44–1.00)
GFR, Estimated: >60 mL/min (ref >60)
Glucose, Bld: 102 mg/dL — ABNORMAL HIGH (ref 70–99)
Potassium: 3 mmol/L — ABNORMAL LOW (ref 3.5–5.1)
Sodium: 121 mmol/L — ABNORMAL LOW (ref 135–145)

## 2022-09-02 LAB — SODIUM, URINE, RANDOM: Sodium, Ur: 77 mmol/L

## 2022-09-02 LAB — OSMOLALITY, URINE: Osmolality, Ur: 231 mOsm/kg — ABNORMAL LOW (ref 300–900)

## 2022-09-02 LAB — SODIUM: Sodium: 120 mmol/L — ABNORMAL LOW (ref 135–145)

## 2022-09-02 MED ORDER — LABETALOL HCL 5 MG/ML IV SOLN
10.0000 mg | INTRAVENOUS | Status: DC | PRN
  Administered 2022-09-05 (×2): 10 mg via INTRAVENOUS
  Filled 2022-09-02 (×2): qty 4

## 2022-09-02 MED ORDER — HYDRALAZINE HCL 25 MG PO TABS
25.0000 mg | ORAL_TABLET | Freq: Four times a day (QID) | ORAL | Status: DC
  Administered 2022-09-02 – 2022-09-03 (×4): 25 mg via ORAL
  Filled 2022-09-02 (×4): qty 1

## 2022-09-02 MED ORDER — POTASSIUM CHLORIDE 10 MEQ/100ML IV SOLN: 10.0000 meq | INTRAVENOUS | Status: DC

## 2022-09-02 MED ORDER — POTASSIUM CHLORIDE CRYS ER 20 MEQ PO TBCR
20.0000 meq | EXTENDED_RELEASE_TABLET | Freq: Once | ORAL | Status: AC
  Administered 2022-09-02: 20 meq via ORAL
  Filled 2022-09-02: qty 1

## 2022-09-02 MED ORDER — POTASSIUM CHLORIDE CRYS ER 20 MEQ PO TBCR
40.0000 meq | EXTENDED_RELEASE_TABLET | Freq: Two times a day (BID) | ORAL | Status: AC
  Administered 2022-09-02 (×2): 40 meq via ORAL
  Filled 2022-09-02 (×2): qty 2

## 2022-09-02 MED ORDER — SODIUM CHLORIDE 0.9 % IV SOLN: INTRAVENOUS | Status: DC

## 2022-09-02 MED ORDER — ORAL CARE MOUTH RINSE: 15.0000 mL | OROMUCOSAL | Status: DC | PRN

## 2022-09-02 MED ORDER — SILVER SULFADIAZINE 1 % EX CREA
TOPICAL_CREAM | Freq: Every day | CUTANEOUS | Status: DC
  Administered 2022-09-02: 1 via TOPICAL
  Filled 2022-09-02: qty 50

## 2022-09-02 MED ORDER — SODIUM CHLORIDE 1 G PO TABS
2.0000 g | ORAL_TABLET | Freq: Three times a day (TID) | ORAL | Status: DC
  Administered 2022-09-02 – 2022-09-03 (×2): 2 g via ORAL
  Filled 2022-09-02 (×2): qty 2

## 2022-09-02 NOTE — Progress Notes (Signed)
Chaplain engaged in an initial visit with Paula Griffith and her youngest daughter. Chaplain also engaged in narrative life review as well as honored consult for an Scientist, physiological, Press photographer.   Chaplain assesses that completing an Advanced Directive would be beneficial for Paula Griffith as she has multiple children and some unique family dynamics. Chaplain is giving Paula Griffith and family time to think about completing the paperwork and who she would like to appoint as her healthcare agent. Paula Griffith vocalized that quality of life is important to her.   Chaplain will follow-up tomorrow. Chaplain assessed that Paula Griffith has a strong and supportive community around her.   Chaplain Danene Montijo, MDiv  09/02/22 1100  Spiritual Encounters  Type of Visit Initial  Care provided to: Pt and family  Reason for visit Advance directives  Spiritual Framework  Presenting Themes Significant life change;Impactful experiences and emotions;Community and relationships  Community/Connection Family;Faith community  Interventions  Spiritual Care Interventions Made Compassionate presence;Established relationship of care and support;Reflective listening;Narrative/life review;Explored values/beliefs/practices/strengths  Intervention Outcomes  Outcomes Awareness of support;Connection to values and goals of care

## 2022-09-02 NOTE — Assessment & Plan Note (Addendum)
Anemia today appears to be chronic and about her baseline for several years now. Followed by Dr. Burr Medico.
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
## 2022-09-02 NOTE — Progress Notes (Signed)
PROGRESS NOTE    Paula Griffith  [id number] DOB: 01/01/1946 DOA: 09/01/2022 PCP: Cassandria Anger, MD   Brief Narrative:  The patient is a 77 year old chronically ill-appearing African-American female with a past medical history significant for but not limited to hypertension, prior retinal infarct, myeloproliferative neoplasm as well as other comorbidities who presented to the ED with complaints of left eye droop, numbness in her left hand and face as well as left-sided systems have been mostly intermittent for last 3 days.  Her eye droop had been constant but daughter states that comes and goes.  She was initially seen in the PCPs office the PCP was concerned for left facial weakness and paralysis as well and sent the patient to the ED.  In the ED her blood pressures were extremely high and systolics were between 123456 and 260.  Symptoms started to slowly improve after she was given hydralazine and blood pressure started to improve.  She had ongoing symptoms in the ED with left eyelid droop however any new visual changes at this time but states that this has been intermittent happening.  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    Assessment and Plan: * Hypertensive urgency BPs running [id number] systolic today prior to treatment in ED. MRI negative for acute stroke. Neurology thinks her intermittent neuro symptoms are secondary to HTN urgency, does not feel she needs transfer to [facility] or stroke work up per discussion with Dr. Rory Percy -Cont home Amlodipine, coreg. -Stop HCTZ given hyponatremia -PRN IV hydralazine and added Scheduled Hydralazine 25 mg po q6h -Daughter to bring in pts home meds to we can figure out what exactly she is taking and when. -"Carotid artery PVD" listed on chart; however, looks like this was always <40% bilaterally in prior years. Most recent was in 2018  however so repeat Carotid done this time.  -Repeat Carotid done and showed "Right Carotid: Velocities in the right ICA are consistent with a 1-39% stenosis. Left Carotid: Velocities in the left ICA are consistent with a 1-39%  stenosis. Vertebrals: Bilateral vertebral arteries demonstrate antegrade flow."  -Continue to monitor blood pressures per protocol -Blood pressures remain elevated and last 1 was 217/78 so we will add IV labetalol  Acute on chronic hyponatremia -H/o same in past. -Despite this history, looks like she is on HCTZ still so we will recommend discontinuing it and holding -Na+ Trend: Recent Labs  Lab 09/01/22 1637 09/02/22 0500  NA 121* 121*  -Appears to have SIADH and I discussed the case with nephrology Dr. Soyla Murphy is a curbside and he recommends salt tablets 2 g 3 times daily and repeating sodiums every 6 and recommended fluid restriction to 1000 mL Daily  -Follow Na+ Trend  Open wound of skin -Wound care consult for the small burn wound with eschar  Hypokalemia -Patient's K+ Level Trend: Recent Labs  Lab 09/01/22 1637 09/02/22 0500  K 3.3* 3.0*  -Replete with po KCL 40 mEQ BID -Continue to Monitor and Replete as Necessary -Repeat CMP in the AM   MPN (Myeloproliferative Neoplasm) ([place]) Anemia of Chronic Disease -Anemia today appears to be chronic and about her baseline for several years now. Recent Labs  Lab 09/01/22 1637 09/02/22 0500  HGB 9.1* 8.7*  HCT 24.7* 24.5*  MCV 88.8 89.7  -Check Anemia Panel in the AM -Followed by Dr. Burr Medico and will notify her as a  courtesy -Continue monitor for signs and symptoms bleeding; no overt bleeding noted -Repeat CBC in a.m.  DVT prophylaxis: enoxaparin (LOVENOX) injection 40 mg Start: 09/02/22 1400    Code Status: Full Code Family Communication: Spoke with daughter at bedside  Disposition Plan:  Level of care: Progressive Status is: Observation The patient will require care spanning > 2 midnights and should  be moved to inpatient because: Needs further clinical improvement in her sodium and improvement in her blood pressure   Consultants:  None  Procedures:  As delineated as above  Antimicrobials:  Anti-infectives (From admission, onward)    None       Subjective: Seen and examined at bedside and she is still getting her carotid duplex.  Feels a little bit better.  No nausea or vomiting.  States her left eye comes and goes but is not drooping now.  Denied any nausea or vomiting.  Had a slight headache.  No other concerns or complaints at this time.  Objective: Vitals:   09/02/22 0846 09/02/22 0955 09/02/22 1220 09/02/22 1730  BP: (!) 200/80 (!) 146/60 (!) 176/62 (!) 217/78  Pulse: 68   70  Resp: '20 20 20 14  '$ Temp: 98.4 F (36.9 C) 98.5 F (36.9 C)    TempSrc: Oral     SpO2: 99% 99% 98% 98%  Weight:      Height:        Intake/Output Summary (Last 24 hours) at 09/02/2022 1742 Last data filed at 09/02/2022 1600 Gross per 24 hour  Intake 960 ml  Output 1900 ml  Net -940 ml   Filed Weights   09/01/22 1605 09/01/22 2200  Weight: 59.4 kg 61.6 kg   Examination: Physical Exam:  Constitutional: WN/WD elderly chronically ill-appearing African-American female in no acute distress Respiratory: Diminished to auscultation bilaterally, no wheezing, rales, rhonchi or crackles. Normal respiratory effort and patient is not tachypenic. No accessory muscle use.  Unlabored breathing Cardiovascular: RRR, no murmurs / rubs / gallops. S1 and S2 auscultated. No extremity edema.  Abdomen: Soft, non-tender, non-distended. Bowel sounds positive.  GU: Deferred. Musculoskeletal: No clubbing / cyanosis of digits/nails. No joint deformity upper and lower extremities.  Skin: No rashes, lesions, ulcers limited skin evaluation. No induration; Warm and dry.  Neurologic: CN 2-12 grossly intact with no focal deficits.  Romberg sign and cerebellar reflexes not assessed.  Psychiatric: Normal judgment and  insight. Alert and oriented x 3. Normal mood and appropriate affect.   Data Reviewed: I have personally reviewed following labs and imaging studies  CBC: Recent Labs  Lab 09/01/22 1637 09/02/22 0500  WBC 5.5 6.5  NEUTROABS 3.4  --   HGB 9.1* 8.7*  HCT 24.7* 24.5*  MCV 88.8 89.7  PLT 239 [id number]   Basic Metabolic Panel: Recent Labs  Lab 09/01/22 1637 09/02/22 0500  NA 121* 121*  K 3.3* 3.0*  CL 87* 88*  CO2 25 25  GLUCOSE 99 102*  BUN 9 8  CREATININE 0.66 0.65  CALCIUM 8.8* 8.5*   GFR: Estimated Creatinine Clearance: 49.5 mL/min (by C-G formula based on SCr of 0.65 mg/dL). Liver Function Tests: Recent Labs  Lab 09/01/22 1637  AST 29  ALT 8  ALKPHOS 43  BILITOT 0.6  PROT 7.0  ALBUMIN 3.8   No results for input(s): "LIPASE", "AMYLASE" in the last 168 hours. No results for input(s): "AMMONIA" in the last 168 hours. Coagulation Profile: Recent Labs  Lab 09/01/22 1637  INR 1.0   Cardiac Enzymes: No results for  input(s): "CKTOTAL", "CKMB", "CKMBINDEX", "TROPONINI" in the last 168 hours. BNP (last 3 results) Recent Labs    04/02/22 1611  PROBNP 75.0   HbA1C: No results for input(s): "HGBA1C" in the last 72 hours. CBG: No results for input(s): "GLUCAP" in the last 168 hours. Lipid Profile: No results for input(s): "CHOL", "HDL", "LDLCALC", "TRIG", "CHOLHDL", "LDLDIRECT" in the last 72 hours. Thyroid Function Tests: No results for input(s): "TSH", "T4TOTAL", "FREET4", "T3FREE", "THYROIDAB" in the last 72 hours. Anemia Panel: No results for input(s): "VITAMINB12", "FOLATE", "FERRITIN", "TIBC", "IRON", "RETICCTPCT" in the last 72 hours. Sepsis Labs: No results for input(s): "PROCALCITON", "LATICACIDVEN" in the last 168 hours.  No results found for this or any previous visit (from the past 240 hour(s)).   Radiology Studies: VAS US CAROTID  Result Date: 09/02/2022 Carotid Arterial Duplex Study Patient Name:  HARLYNN AUDAS  Date of Exam:   09/02/2022 Medical  Rec #: [id number]       Accession #:    [id number] Date of Birth: 1945-08-25       Patient Gender: F Patient Age:   62 years Exam Location:  [facility] Procedure:      VAS US CAROTID Referring Phys: Jennette Kettle --------------------------------------------------------------------------------  Indications:       Carotid stenosis-bilateral I65.23. Risk Factors:      Hypertension. Comparison Study:  No prior studies. Performing Technologist: Oliver Hum RVT  Examination Guidelines: A complete evaluation includes B-mode imaging, spectral Doppler, color Doppler, and power Doppler as needed of all accessible portions of each vessel. Bilateral testing is considered an integral part of a complete examination. Limited examinations for reoccurring indications may be performed as noted.  Right Carotid Findings: +----------+--------+--------+--------+-----------------------+--------+           PSV cm/sEDV cm/sStenosisPlaque Description     Comments +----------+--------+--------+--------+-----------------------+--------+ CCA Prox  150     29              smooth and heterogenous         +----------+--------+--------+--------+-----------------------+--------+ CCA Distal103     22              smooth and heterogenous         +----------+--------+--------+--------+-----------------------+--------+ ICA Prox  93      23              smooth and heterogenous         +----------+--------+--------+--------+-----------------------+--------+ ICA Mid   120     36                                              +----------+--------+--------+--------+-----------------------+--------+ ICA Distal117     35                                     tortuous +----------+--------+--------+--------+-----------------------+--------+ ECA       128     13                                              +----------+--------+--------+--------+-----------------------+--------+  +----------+--------+-------+--------+-------------------+           PSV cm/sEDV cmsDescribeArm Pressure (mmHG) +----------+--------+-------+--------+-------------------+ [id number]                                        +----------+--------+-------+--------+-------------------+ +---------+--------+---+--------+--+---------+  VertebralPSV cm/s107EDV cm/s34Antegrade +---------+--------+---+--------+--+---------+  Left Carotid Findings: +----------+--------+--------+--------+-----------------------+--------+           PSV cm/sEDV cm/sStenosisPlaque Description     Comments +----------+--------+--------+--------+-----------------------+--------+ CCA Prox  133     29                                              +----------+--------+--------+--------+-----------------------+--------+ CCA Distal115     23              smooth and heterogenous         +----------+--------+--------+--------+-----------------------+--------+ ICA Prox  80      24              smooth and heterogenous         +----------+--------+--------+--------+-----------------------+--------+ ICA Mid   119     39                                              +----------+--------+--------+--------+-----------------------+--------+ ICA Distal111     36                                     tortuous +----------+--------+--------+--------+-----------------------+--------+ ECA       122     21                                              +----------+--------+--------+--------+-----------------------+--------+ +----------+--------+--------+--------+-------------------+           PSV cm/sEDV cm/sDescribeArm Pressure (mmHG) +----------+--------+--------+--------+-------------------+ Subclavian209                                         +----------+--------+--------+--------+-------------------+ +---------+--------+---+--------+--+---------+ VertebralPSV cm/s133EDV cm/s30Antegrade  +---------+--------+---+--------+--+---------+   Summary: Right Carotid: Velocities in the right ICA are consistent with a 1-39% stenosis. Left Carotid: Velocities in the left ICA are consistent with a 1-39% stenosis. Vertebrals: Bilateral vertebral arteries demonstrate antegrade flow. *See table(s) above for measurements and observations.  Electronically signed by Jamelle Haring on 09/02/2022 at 12:36:42 PM.    Final    MR BRAIN WO CONTRAST  Result Date: 09/01/2022 CLINICAL DATA:  Neuro deficit with acute stroke suspected. Left hand numbness. Facial droop EXAM: MRI HEAD WITHOUT CONTRAST TECHNIQUE: Multiplanar, multiecho pulse sequences of the brain and surrounding structures were obtained without intravenous contrast. COMPARISON:  04/26/2022 head CT FINDINGS: Brain: No acute infarction, hemorrhage, hydrocephalus, extra-axial collection or mass lesion. Patchy FLAIR hyperintensity in the cerebral white matter attributed to chronic small vessel ischemia. There is cortical to juxtacortical remote insult along the right frontal parietal convexity. Brain volume is normal for age. Vascular: Normal flow voids. Skull and upper cervical spine: Cervical fusion at C3-4. No evidence of bone lesion. Sinuses/Orbits: Bilateral cataract resection. Secretions seen in the sphenoid sinuses. IMPRESSION: 1. No acute finding including infarct. 2. Moderate chronic small vessel ischemia in the cerebral white matter. Electronically Signed   By: Jorje Guild M.D.   On: 09/01/2022 20:29    Scheduled Meds:  carvedilol  25 mg  Oral BID   enoxaparin (LOVENOX) injection  40 mg Subcutaneous Q24H   hydrALAZINE  25 mg Oral Q6H   potassium chloride  40 mEq Oral BID   silver sulfADIAZINE   Topical Daily   sodium chloride  2 g Oral TID WC   Continuous Infusions:   LOS: 0 days   Raiford Noble, DO [facility] Available via Epic secure chat 7am-7pm After these hours, please refer to coverage provider listed on [email or web address] 09/02/2022,  5:42 PM

## 2022-09-02 NOTE — Evaluation (Signed)
Occupational Therapy Evaluation Patient Details Name: Paula Griffith MRN: [id number] DOB: [birth year] Today's Date: 09/02/2022   History of Present Illness Paula Griffith is a 77 y.o. female admitted with hypertensive urgency. MRI: No acute finding including infarct.  PMH: HTN, leukemia, spondylolisthesis of lumbar region, thrombocytopenia, myeloproliferative neoplasm   Clinical Impression   Paula Griffith demonstrates independence with mobility and ADLs. She has no focal neurological deficits in her upper extremities or lower extremities. She reports no change in vision. She has no cognitive deficits. She reports only her eye lid drooping. From an OT standpoint patient has no needs.       Recommendations for follow up therapy are one component of a multi-disciplinary discharge planning process, led by the attending physician.  Recommendations may be updated based on patient status, additional functional criteria and insurance authorization.   Follow Up Recommendations  No OT follow up     Assistance Recommended at Discharge PRN  Patient can return home with the following Assistance with cooking/housework    Functional Status Assessment  Patient has not had a recent decline in their functional status  Equipment Recommendations  None recommended by OT    Recommendations for Other Services       Precautions / Restrictions Precautions Precautions: Fall Restrictions Weight Bearing Restrictions: No      Mobility Bed Mobility Overal bed mobility: Modified Independent                  Transfers Overall transfer level: Needs assistance Equipment used: Rolling walker (2 wheels) Transfers: Sit to/from Stand Sit to Stand: Supervision                  Balance Overall balance assessment: Mild deficits observed, not formally tested                                         ADL either performed or assessed with clinical judgement   ADL Overall  ADL's : Independent                                             Vision Patient Visual Report: No change from baseline Additional Comments: lefy eye lid drooping but not new visual changes. Reports some double vision since lens surgery.     Perception     Praxis      Pertinent Vitals/Pain Pain Assessment Pain Assessment: No/denies pain     Hand Dominance Right   Extremity/Trunk Assessment Upper Extremity Assessment Upper Extremity Assessment: RUE deficits/detail;LUE deficits/detail RUE Deficits / Details: WFL ROM and strength RUE Sensation: WNL RUE Coordination: WNL LUE Deficits / Details: WFL ROM and strength LUE Sensation: WNL LUE Coordination: WNL   Lower Extremity Assessment Lower Extremity Assessment: Overall WFL for tasks assessed RLE Deficits / Details: AROM WNL, knee flexion 4+/5, knee extension 4+/5, hip flexion 4-/5, hip adduction 4-/5, hip abduction 4-/5, ankle dorsiflexion 5/5 RLE Sensation: WNL RLE Coordination: WNL LLE Deficits / Details: AROM WNL, knee flexion 4+/5, knee extension 4+/5, hip flexion 4-/5, hip adduction 4-/5, hip abduction 4-/5, ankle dorsiflexion 4-/5 LLE Sensation: WNL LLE Coordination: WNL   Cervical / Trunk Assessment Cervical / Trunk Assessment: Kyphotic   Communication Communication Communication: No difficulties   Cognition Arousal/Alertness: Awake/alert Behavior During Therapy: WFL for  tasks assessed/performed Overall Cognitive Status: Within Functional Limits for tasks assessed                                       General Comments       Exercises     Shoulder Instructions      Home Living Family/patient expects to be discharged to:: Private residence Living Arrangements: Alone Available Help at Discharge: Family;Available PRN/intermittently Type of Home: House Home Access: Stairs to enter [employer] of Steps: 2 Entrance Stairs-Rails: None Home Layout: One level      Bathroom Shower/Tub: Tub/shower unit;Curtain   Bathroom Toilet: Handicapped height Bathroom Accessibility: Yes   Home Equipment: Cane - single point;Grab bars - tub/shower;Rolling Walker (2 wheels)          Prior Functioning/Environment Prior Level of Function : Independent/Modified Independent;Driving             Mobility Comments: amb with SPC around the home and community distances ADLs Comments: ind        OT Problem List:        OT Treatment/Interventions:      OT Goals(Current goals can be found in the care plan section) Acute Rehab OT Goals OT Goal Formulation: All assessment and education complete, DC therapy  OT Frequency:      Co-evaluation   Reason for Co-Treatment: To address functional/ADL transfers PT goals addressed during session: Mobility/safety with mobility;Balance;Proper use of DME        AM-PAC OT "6 Clicks" Daily Activity     Outcome Measure Help from another person eating meals?: None Help from another person taking care of personal grooming?: None Help from another person toileting, which includes using toliet, bedpan, or urinal?: None Help from another person bathing (including washing, rinsing, drying)?: None Help from another person to put on and taking off regular upper body clothing?: None Help from another person to put on and taking off regular lower body clothing?: None 6 Click Score: 24   End of Session Equipment Utilized During Treatment: Rolling walker (2 wheels) Nurse Communication: Mobility status  Activity Tolerance: Patient tolerated treatment well Patient left: in chair;with call bell/phone within reach  OT Visit Diagnosis: Muscle weakness (generalized) (M62.81)                Time: [id number] OT Time Calculation (min): 12 min Charges:  OT General Charges $OT Visit: 1 Visit OT Evaluation $OT Eval Low Complexity: 1 Low  Gustavo Lah, OTR/L [place]  Office [phone]   Lenward Chancellor 09/02/2022, 3:50 PM

## 2022-09-02 NOTE — Progress Notes (Signed)
Carotid artery duplex has been completed. Preliminary results can be found in CV Proc through chart review.   09/02/22 10:23 AM Paula Griffith RVT
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
## 2022-09-03 ENCOUNTER — Encounter (HOSPITAL_COMMUNITY): Payer: Self-pay | Admitting: Internal Medicine

## 2022-09-03 DIAGNOSIS — Z887 Allergy status to serum and vaccine status: Secondary | ICD-10-CM | POA: Diagnosis not present

## 2022-09-03 DIAGNOSIS — D638 Anemia in other chronic diseases classified elsewhere: Secondary | ICD-10-CM | POA: Diagnosis present

## 2022-09-03 DIAGNOSIS — Z7982 Long term (current) use of aspirin: Secondary | ICD-10-CM | POA: Diagnosis not present

## 2022-09-03 DIAGNOSIS — T24011A Burn of unspecified degree of right thigh, initial encounter: Secondary | ICD-10-CM | POA: Diagnosis present

## 2022-09-03 DIAGNOSIS — E876 Hypokalemia: Secondary | ICD-10-CM | POA: Diagnosis not present

## 2022-09-03 DIAGNOSIS — E8809 Other disorders of plasma-protein metabolism, not elsewhere classified: Secondary | ICD-10-CM | POA: Diagnosis present

## 2022-09-03 DIAGNOSIS — Z8249 Family history of ischemic heart disease and other diseases of the circulatory system: Secondary | ICD-10-CM | POA: Diagnosis not present

## 2022-09-03 DIAGNOSIS — M4316 Spondylolisthesis, lumbar region: Secondary | ICD-10-CM | POA: Diagnosis present

## 2022-09-03 DIAGNOSIS — I160 Hypertensive urgency: Secondary | ICD-10-CM | POA: Diagnosis not present

## 2022-09-03 DIAGNOSIS — Z8719 Personal history of other diseases of the digestive system: Secondary | ICD-10-CM | POA: Diagnosis not present

## 2022-09-03 DIAGNOSIS — Z79899 Other long term (current) drug therapy: Secondary | ICD-10-CM | POA: Diagnosis not present

## 2022-09-03 DIAGNOSIS — E871 Hypo-osmolality and hyponatremia: Secondary | ICD-10-CM | POA: Diagnosis not present

## 2022-09-03 DIAGNOSIS — R531 Weakness: Secondary | ICD-10-CM | POA: Diagnosis not present

## 2022-09-03 DIAGNOSIS — Z888 Allergy status to other drugs, medicaments and biological substances status: Secondary | ICD-10-CM | POA: Diagnosis not present

## 2022-09-03 DIAGNOSIS — H02402 Unspecified ptosis of left eyelid: Secondary | ICD-10-CM | POA: Diagnosis present

## 2022-09-03 DIAGNOSIS — J45909 Unspecified asthma, uncomplicated: Secondary | ICD-10-CM | POA: Diagnosis present

## 2022-09-03 DIAGNOSIS — T502X5A Adverse effect of carbonic-anhydrase inhibitors, benzothiadiazides and other diuretics, initial encounter: Secondary | ICD-10-CM | POA: Diagnosis present

## 2022-09-03 DIAGNOSIS — Z90710 Acquired absence of both cervix and uterus: Secondary | ICD-10-CM | POA: Diagnosis not present

## 2022-09-03 DIAGNOSIS — X088XXA Exposure to other specified smoke, fire and flames, initial encounter: Secondary | ICD-10-CM | POA: Diagnosis present

## 2022-09-03 DIAGNOSIS — I129 Hypertensive chronic kidney disease with stage 1 through stage 4 chronic kidney disease, or unspecified chronic kidney disease: Secondary | ICD-10-CM | POA: Diagnosis present

## 2022-09-03 DIAGNOSIS — D471 Chronic myeloproliferative disease: Secondary | ICD-10-CM | POA: Diagnosis not present

## 2022-09-03 DIAGNOSIS — E222 Syndrome of inappropriate secretion of antidiuretic hormone: Secondary | ICD-10-CM | POA: Diagnosis present

## 2022-09-03 DIAGNOSIS — Z981 Arthrodesis status: Secondary | ICD-10-CM | POA: Diagnosis not present

## 2022-09-03 DIAGNOSIS — R29810 Facial weakness: Secondary | ICD-10-CM | POA: Diagnosis present

## 2022-09-03 DIAGNOSIS — Z885 Allergy status to narcotic agent status: Secondary | ICD-10-CM | POA: Diagnosis not present

## 2022-09-03 DIAGNOSIS — N182 Chronic kidney disease, stage 2 (mild): Secondary | ICD-10-CM | POA: Diagnosis present

## 2022-09-03 DIAGNOSIS — Z856 Personal history of leukemia: Secondary | ICD-10-CM | POA: Diagnosis not present

## 2022-09-03 LAB — CBC WITH DIFFERENTIAL/PLATELET
Abs Immature Granulocytes: 0.01 10*3/uL (ref 0.00–0.07)
Basophils Absolute: 0 10*3/uL (ref 0.0–0.1)
Basophils Relative: 1 %
Eosinophils Absolute: 0.1 10*3/uL (ref 0.0–0.5)
Eosinophils Relative: 1 %
HCT: 25 % — ABNORMAL LOW (ref 36.0–46.0)
Hemoglobin: 8.7 g/dL — ABNORMAL LOW (ref 12.0–15.0)
Immature Granulocytes: 0 %
Lymphocytes Relative: 23 %
Lymphs Abs: 1.6 10*3/uL (ref 0.7–4.0)
MCH: 31.9 pg (ref 26.0–34.0)
MCHC: 34.8 g/dL (ref 30.0–36.0)
MCV: 91.6 fL (ref 80.0–100.0)
Monocytes Absolute: 0.9 10*3/uL (ref 0.1–1.0)
Monocytes Relative: 13 %
Neutro Abs: 4.3 10*3/uL (ref 1.7–7.7)
Neutrophils Relative %: 62 %
Platelets: 213 10*3/uL (ref 150–400)
RBC: 2.73 MIL/uL — ABNORMAL LOW (ref 3.87–5.11)
RDW: 11.5 % (ref 11.5–15.5)
WBC: 6.9 10*3/uL (ref 4.0–10.5)
nRBC: 0 % (ref 0.0–0.2)

## 2022-09-03 LAB — COMPREHENSIVE METABOLIC PANEL
ALT: 7 U/L (ref 0–44)
AST: 23 U/L (ref 15–41)
Albumin: 3.3 g/dL — ABNORMAL LOW (ref 3.5–5.0)
Alkaline Phosphatase: 40 U/L (ref 38–126)
Anion gap: 4 — ABNORMAL LOW (ref 5–15)
BUN: 10 mg/dL (ref 8–23)
CO2: 23 mmol/L (ref 22–32)
Calcium: 8.4 mg/dL — ABNORMAL LOW (ref 8.9–10.3)
Chloride: 92 mmol/L — ABNORMAL LOW (ref 98–111)
Creatinine, Ser: 0.85 mg/dL (ref 0.44–1.00)
GFR, Estimated: >60 mL/min (ref >60)
Glucose, Bld: 97 mg/dL (ref 70–99)
Potassium: 4.4 mmol/L (ref 3.5–5.1)
Sodium: 119 mmol/L — CL (ref 135–145)
Total Bilirubin: 0.6 mg/dL (ref 0.3–1.2)
Total Protein: 6 g/dL — ABNORMAL LOW (ref 6.5–8.1)

## 2022-09-03 LAB — MAGNESIUM: Magnesium: 1.7 mg/dL (ref 1.7–2.4)

## 2022-09-03 LAB — SODIUM
Sodium: 120 mmol/L — ABNORMAL LOW (ref 135–145)
Sodium: 119 mmol/L — CL (ref 135–145)
Sodium: 124 mmol/L — ABNORMAL LOW (ref 135–145)

## 2022-09-03 LAB — TSH: TSH: 1.975 u[IU]/mL (ref 0.350–4.500)

## 2022-09-03 LAB — PHOSPHORUS: Phosphorus: 3.3 mg/dL (ref 2.5–4.6)

## 2022-09-03 MED ORDER — TOLVAPTAN 15 MG PO TABS
15.0000 mg | ORAL_TABLET | Freq: Once | ORAL | Status: AC
  Administered 2022-09-03: 15 mg via ORAL
  Filled 2022-09-03: qty 1

## 2022-09-03 MED ORDER — SODIUM CHLORIDE 1 G PO TABS
1.0000 g | ORAL_TABLET | Freq: Three times a day (TID) | ORAL | Status: DC
  Administered 2022-09-03 (×2): 1 g via ORAL
  Filled 2022-09-03 (×2): qty 1

## 2022-09-03 MED ORDER — MAGNESIUM SULFATE 2 GM/50ML IV SOLN
2.0000 g | Freq: Once | INTRAVENOUS | Status: AC
  Administered 2022-09-03: 2 g via INTRAVENOUS
  Filled 2022-09-03: qty 50

## 2022-09-03 MED ORDER — HYDRALAZINE HCL 50 MG PO TABS
50.0000 mg | ORAL_TABLET | Freq: Four times a day (QID) | ORAL | Status: DC
  Administered 2022-09-03 – 2022-09-04 (×4): 50 mg via ORAL
  Filled 2022-09-03 (×4): qty 1
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
## 2022-09-03 NOTE — Consult Note (Signed)
Renal Service Consult Note [facility]  Paula Griffith 09/03/2022 Sol Blazing, MD Requesting Physician: Dr Alfredia Ferguson    Reason for Consult: Hyponatremia HPI: The patient is a 77 y.o. year-old w/ PMH as below who presented to ED w/ L sided facial weakness for several days. In ED BP was very high w/ SBP 230-260s. Symptoms abated w/ IV hydralazine and BP's started to improve. W/u also showed low Na+ of 121. Salt tabs and fluid restriction were started by Na+ was down to 119 this am so we are asked to see for hyponatremia.   Pt seen in room. Pt doesn't have any c/o's.   Chart review shows she was taking 12.'5mg'$  HCTZ prior to admit, held here. No SSRI, no acei or nsaids. No other diuretics. Urea is on her med list but she says it was "too expensive" and insurance doesn't cover it.     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   Spondylolisthesis of lumbar region    Thrombocytopathy [facility]) 2011   Dr Ralene Ok   Past Surgical History  Past Surgical History:  Procedure Laterality Date   ABDOMINAL HYSTERECTOMY  1989   ANTERIOR CERVICAL DECOMP/DISCECTOMY FUSION N/A 09/22/2017   Procedure: ANTERIOR CERVICAL DECOMPRESSION AND FUSION CERVICAL THREE-FOUR.;  Surgeon: Eustace Moore, MD;  Location: [place];  Service: Neurosurgery;  Laterality: N/A;  anterior   BACK SURGERY     BREAST EXCISIONAL BIOPSY Left    BREAST SURGERY      Left   LUMBAR LAMINECTOMY/DECOMPRESSION MICRODISCECTOMY Left 01/06/2018   Procedure: laminectomy Lumbar five -Sacral one - left, Lumbar two-Lumbar three - bilateral;  Surgeon: Eustace Moore, MD;  Location: [place];  Service: Neurosurgery;  Laterality: Left;   Family History  Family History  Problem Relation Age of Onset   Pancreatic cancer Mother    Arthritis Mother        RA   Early death Father 67       MI   Coronary artery disease Other        Female 1st degree relative Female <60   Lung cancer Other    Diabetes Sister    Breast cancer Maternal Grandmother    Colon cancer Neg Hx    Stomach cancer Neg Hx    Social History  reports that she has never smoked. She has been exposed to tobacco smoke. She has never used smokeless tobacco. She reports that she does not drink alcohol and does not use drugs. Allergies  Allergies  Allergen Reactions   Thiazide-Type Diuretics Other (See Comments)    Hyponatremia 119 in march 2023 (was taking HCTZ)   Benazepril Cough    cough   Amlodipine     Leg edema at 5 mg/d   Fluad [Influenza Vac A&B Surf Ant Adj]  Very sick   Other     BAND-AID CAUSE SKIN TEARS/BRUISES   Codeine Nausea Only   Tramadol Other (See Comments)    Dizzy, nauseated   Home medications Prior to Admission medications   Medication Sig Start Date End Date Taking? Authorizing Provider  albuterol (PROAIR HFA) 108 (90 Base) MCG/ACT inhaler Inhale 2 puffs into the lungs every 4 (four) hours as needed. For shortness of breath 08/07/19  Yes [name], [name], MD  anagrelide (AGRYLIN) 1 MG capsule Take 1 capsule (1 mg total) by mouth See admin instructions. TAKE 1 CAPSULE BY MOUTH DAILY EXCEPT 2 DAYS A WEEK (TUESDAYS AND THURSDAYS) DO NOT TAKE. 04/23/22  Yes Truitt Merle, MD  Ascorbic Acid (VITAMIN C) 1000 MG tablet Take 1,000 mg by mouth daily.   Yes [provider]  aspirin 325 MG EC tablet Take 325 mg by mouth daily after breakfast.   Yes [provider]   carvedilol (COREG) 25 MG tablet Take 1 tablet (25 mg total) by mouth 2 (two) times daily. Patient taking differently: Take 50 mg by mouth daily. 09/29/21 09/29/22 Yes [name], [name], MD  Cholecalciferol (VITAMIN D3) 2000 units TABS Take 2,000 Units by mouth daily.   Yes [provider]  Fluticasone-Salmeterol (ADVAIR) 250-50 MCG/DOSE AEPB Please specify directions, refills and quantity 08/07/19  Yes [name], [name], MD  gabapentin (NEURONTIN) 100 MG capsule TAKE 1 CAPSULE BY MOUTH THREE TIMES A DAY Patient taking differently: Take 100 mg by mouth 3 (three) times daily. 07/29/22  Yes [name], [name], MD  hydrochlorothiazide (MICROZIDE) 12.5 MG capsule Take 1 capsule (12.5 mg total) by mouth daily. 06/30/22  Yes [name], [name], MD  Multiple Vitamins-Minerals (ADULT GUMMY PO) Take 2 tablets by mouth daily.   Yes [provider]  trolamine salicylate (ASPERCREME) 10 % cream Apply 1 application topically 2 (two) times daily as needed for muscle pain.   Yes [provider]  urea (URE-NA) 15 g PACK oral packet Take 15 g (one packet) by mouth daily. 03/17/22  Yes [name], [name], MD  amLODipine (NORVASC) 5 MG tablet TAKE 1 TABLET BY MOUTH EVERY DAY Patient not taking: Reported on 09/02/2022 08/18/21   [name], [name], MD  ketoconazole (NIZORAL) 2 % cream Apply 1 Application topically 2 (two) times daily. Patient not taking: Reported on 09/02/2022 02/03/22   Janith Lima, MD  potassium chloride (KLOR-CON 10) 10 MEQ tablet Take 1 tablet (10 mEq total) by mouth 2 (two) times daily. 05/07/22   [name], [name], MD  losartan-hydrochlorothiazide (HYZAAR) 100-25 MG tablet Take 1 tablet by mouth daily. 04/03/19 06/28/19  [name], [name], MD     Vitals:   09/03/22 0016 09/03/22 0458 09/03/22 0835 09/03/22 1129  BP: (!) 162/70 (!) 189/64 (!) 194/66 (!) 176/66  Pulse: 78 73 62 67  Resp: '20 20 18 17  '$ Temp: 98.2 F (36.8 C) 98.8 F (37.1 C) 98.7 F (37.1 C)  98.9 F (37.2 C)  TempSrc: Oral Oral Oral Oral  SpO2: 98% 100% 100% 100%  Weight:      Height:       Exam Gen alert, no distress No rash, cyanosis or gangrene Sclera anicteric, throat clear  No jvd or bruits Chest clear bilat to bases, no rales/ wheezing RRR no MRG Abd soft ntnd no mass or ascites +bs GU defer MS no joint effusions or deformity Ext no LE or UE edema, no wounds or ulcers Neuro is alert, Ox 3 , nf  Home meds include - anagrelide (for ET), albuterol, asa, gabapentin, hctz, urea , norvasc, klor-con, prns/ creams/ supps/ vits   UNa 77, UOsm 231   SOsm = 256 (275-295)  Assessment/ Plan: Hyponatremia - had similar admit in July 2023. Na+ 121 here, on HCTZ at home. No SSRI, acei or other diuretics. No hx CHF. Mild CKD. TSH is wnl. BP's high normal now. Euvolemic on exam. Suspect hyponatremia due to thiazide-type diuretics. Will lower NaCl tabs to 1 gm tid and give one dose tolvaptan today. F/u Na+ labs ever 6 hrs x 2 for now. Thiazides listed as intolerance. Get am cortisol. Will follow.  HTNsive urgency - is under control now w/ coreg and hydralazine po H/o essential thrombocytopenia - she does not know if still under treatment. Plts are wnl.  Hypokalemia - corrected      Kelly Splinter  MD [facility] 09/03/2022, 11:33 AM  Recent Labs  Lab 09/01/22 1637 09/02/22 0500 09/03/22 0215  HGB 9.1* 8.7* 8.7*  ALBUMIN 3.8  --  3.3*  CALCIUM 8.8* 8.5* 8.4*  PHOS  --   --  3.3  CREATININE 0.66 0.65 0.85  K 3.3* 3.0* 4.4   Inpatient medications:  carvedilol  25 mg Oral BID   enoxaparin (LOVENOX) injection  40 mg Subcutaneous Q24H   hydrALAZINE  50 mg Oral Q6H   silver sulfADIAZINE   Topical Daily   sodium chloride  1 g Oral TID WC   tolvaptan  15 mg Oral Once    acetaminophen **OR** acetaminophen, hydrALAZINE, labetalol, ondansetron **OR** ondansetron (ZOFRAN) IV, mouth rinse

## 2022-09-04 DIAGNOSIS — E871 Hypo-osmolality and hyponatremia: Secondary | ICD-10-CM | POA: Diagnosis not present

## 2022-09-04 DIAGNOSIS — E876 Hypokalemia: Secondary | ICD-10-CM | POA: Diagnosis not present

## 2022-09-04 DIAGNOSIS — D471 Chronic myeloproliferative disease: Secondary | ICD-10-CM | POA: Diagnosis not present

## 2022-09-04 DIAGNOSIS — I160 Hypertensive urgency: Secondary | ICD-10-CM | POA: Diagnosis not present

## 2022-09-04 LAB — RETICULOCYTES
Immature Retic Fract: 7.4 % (ref 2.3–15.9)
RBC.: 2.7 MIL/uL — ABNORMAL LOW (ref 3.87–5.11)
Retic Count, Absolute: 57.2 10*3/uL (ref 19.0–186.0)
Retic Ct Pct: 2.1 % (ref 0.4–3.1)

## 2022-09-04 LAB — COMPREHENSIVE METABOLIC PANEL
ALT: 8 U/L (ref 0–44)
AST: 20 U/L (ref 15–41)
Albumin: 3.4 g/dL — ABNORMAL LOW (ref 3.5–5.0)
Alkaline Phosphatase: 39 U/L (ref 38–126)
Anion gap: 10 (ref 5–15)
BUN: 13 mg/dL (ref 8–23)
CO2: 24 mmol/L (ref 22–32)
Calcium: 9.1 mg/dL (ref 8.9–10.3)
Chloride: 96 mmol/L — ABNORMAL LOW (ref 98–111)
Creatinine, Ser: 0.77 mg/dL (ref 0.44–1.00)
GFR, Estimated: >60 mL/min (ref >60)
Glucose, Bld: 104 mg/dL — ABNORMAL HIGH (ref 70–99)
Potassium: 4.3 mmol/L (ref 3.5–5.1)
Sodium: 130 mmol/L — ABNORMAL LOW (ref 135–145)
Total Bilirubin: 0.6 mg/dL (ref 0.3–1.2)
Total Protein: 6.1 g/dL — ABNORMAL LOW (ref 6.5–8.1)

## 2022-09-04 LAB — CBC WITH DIFFERENTIAL/PLATELET
Abs Immature Granulocytes: 0.02 10*3/uL (ref 0.00–0.07)
Basophils Absolute: 0.1 10*3/uL (ref 0.0–0.1)
Basophils Relative: 1 %
Eosinophils Absolute: 0.1 10*3/uL (ref 0.0–0.5)
Eosinophils Relative: 2 %
HCT: 25.3 % — ABNORMAL LOW (ref 36.0–46.0)
Hemoglobin: 8.7 g/dL — ABNORMAL LOW (ref 12.0–15.0)
Immature Granulocytes: 0 %
Lymphocytes Relative: 22 %
Lymphs Abs: 1.3 10*3/uL (ref 0.7–4.0)
MCH: 31.6 pg (ref 26.0–34.0)
MCHC: 34.4 g/dL (ref 30.0–36.0)
MCV: 92 fL (ref 80.0–100.0)
Monocytes Absolute: 0.7 10*3/uL (ref 0.1–1.0)
Monocytes Relative: 12 %
Neutro Abs: 3.6 10*3/uL (ref 1.7–7.7)
Neutrophils Relative %: 63 %
Platelets: 206 10*3/uL (ref 150–400)
RBC: 2.75 MIL/uL — ABNORMAL LOW (ref 3.87–5.11)
RDW: 11.6 % (ref 11.5–15.5)
WBC: 5.7 10*3/uL (ref 4.0–10.5)
nRBC: 0 % (ref 0.0–0.2)

## 2022-09-04 LAB — IRON AND TIBC
Iron: 68 ug/dL (ref 28–170)
Saturation Ratios: 23 % (ref 10.4–31.8)
TIBC: 294 ug/dL (ref 250–450)
UIBC: 226 ug/dL

## 2022-09-04 LAB — CORTISOL-AM, BLOOD: Cortisol - AM: 11.1 ug/dL (ref 6.7–22.6)

## 2022-09-04 LAB — FERRITIN: Ferritin: 115 ng/mL (ref 11–307)

## 2022-09-04 LAB — FOLATE: Folate: 35.3 ng/mL (ref >5.9)

## 2022-09-04 LAB — PHOSPHORUS: Phosphorus: 4.2 mg/dL (ref 2.5–4.6)

## 2022-09-04 LAB — VITAMIN B12: Vitamin B-12: 358 pg/mL (ref 180–914)

## 2022-09-04 LAB — MAGNESIUM: Magnesium: 2.3 mg/dL (ref 1.7–2.4)

## 2022-09-04 MED ORDER — HYDRALAZINE HCL 50 MG PO TABS
50.0000 mg | ORAL_TABLET | Freq: Three times a day (TID) | ORAL | Status: DC
  Administered 2022-09-04 – 2022-09-05 (×3): 50 mg via ORAL
  Filled 2022-09-04 (×3): qty 1

## 2022-09-04 MED ORDER — LOSARTAN POTASSIUM 50 MG PO TABS
50.0000 mg | ORAL_TABLET | Freq: Every day | ORAL | Status: DC
  Administered 2022-09-04: 50 mg via ORAL
  Filled 2022-09-04: qty 1

## 2022-09-04 MED ORDER — SODIUM CHLORIDE 1 G PO TABS
2.0000 g | ORAL_TABLET | Freq: Two times a day (BID) | ORAL | Status: DC
  Administered 2022-09-04 – 2022-09-06 (×4): 2 g via ORAL
  Filled 2022-09-04 (×4): qty 2

## 2022-09-04 MED ORDER — CLONIDINE HCL 0.1 MG PO TABS: 0.1000 mg | ORAL_TABLET | Freq: Three times a day (TID) | ORAL | Status: DC

## 2022-09-04 NOTE — Progress Notes (Cosign Needed)
  Transition of Care [facility]) Screening Note   Patient Details  Name: Paula Griffith Date of Birth: 11-12-1945   Transition of Care [facility]) CM/SW Contact:    Dessa Phi, RN Phone Number: 09/04/2022, 2:08 PM    Transition of Care Department [facility]) has reviewed patient and no TOC needs have been identified at this time. We will continue to monitor patient advancement through interdisciplinary progression rounds. If new patient transition needs arise, please place a TOC consult.

## 2022-09-04 NOTE — Progress Notes (Signed)
Mobility Specialist - Progress Note   09/04/22 1057  Mobility  Activity Ambulated with assistance in hallway  Level of Assistance Standby assist, set-up cues, supervision of patient - no hands on  Assistive Device Front wheel walker  Distance Ambulated (ft) 500 ft  Activity Response Tolerated well  Mobility Referral Yes  $Mobility charge 1 Mobility   Pt received in recliner and agreeable to mobility. No complaints during session. Pt to recliner after session with all needs met.     [facility]
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
## 2022-09-04 NOTE — Progress Notes (Signed)
[facility] Progress Note  Subjective: seen in room, Na+ 130 today  Vitals:   09/03/22 2038 09/03/22 2338 09/04/22 0435 09/04/22 0557  BP: (!) 173/69 (!) 158/50  (!) 184/63  Pulse: 83 73  77  Resp: '17 18  18  '$ Temp: 98.3 F (36.8 C) 99.4 F (37.4 C)  98.7 F (37.1 C)  TempSrc: Oral Oral  Oral  SpO2: 98% 100%  100%  Weight:   57.5 kg   Height:        Exam: Gen alert, no distress No rash, cyanosis or gangrene Sclera anicteric, throat clear  No jvd or bruits Chest clear bilat to bases, no rales/ wheezing RRR no MRG Abd soft ntnd no mass or ascites +bs GU defer MS no joint effusions or deformity Ext no LE or UE edema, no wounds or ulcers Neuro is alert, Ox 3 , nf        Home meds include - anagrelide (for ET), albuterol, asa, gabapentin, hctz, urea , norvasc, klor-con, prns/ creams/ supps/ vits    UNa 77, UOsm 231   SOsm = 256 (275-295)    TSH 1.975    Am cortisol - pending   Assessment/ Plan: Hyponatremia - had similar admit in July 2023. Na+ 121 here, on HCTZ at home. No SSRI, acei or other diuretics. No hx CHF. Mild CKD. TSH is wnl. BP's high normal now. Euvolemic on exam. Suspect hyponatremia due to thiazide-type diuretics. She responded to salt tabs / fluid restriction and then tolvaptan 15 mg x 1. Na+ 130 today. Will lower NaCl to 2 gm bid for another 5 days, then can dc salt tabs. Have listed thiazides as intolerance. No further suggestions, will sign off.  HTNsive urgency - is under better control now w/ coreg and hydralazine. Admit BP was 200/100, now 140- 180s/ 60-70s. Would resume her losartan at '50mg'$  /d today. Tomorrow if needed, could raise losartan to 75- 100 / d, or add back norvasc at 5 mg /d. Have d/w pmd. Per UTD in pts who are elderly, frail and w/ sig comorbidities (she fits all these categories), BP goals should be individualized and shared decision-making with the pt and caretakers should be used rather than targeting one of the recommended  BP goals. This is recommended because the side effects of the medications for HTN will do more harm than good in these pts. For this pt w/ mostly systolic HTN issues, I would suggest a more flexible BP goal of SBP 130- 160.  I discussed the pt's situation on the phone w/ the daughter this morning, but would recommend doing this again prior to dc to make sure that caretakers agree w/ these less strict BP goals.  H/o essential thrombocytopenia - she does not know if still under treatment. Plts are wnl.  Hypokalemia - corrected     Kelly Splinter MD [facility] 09/04/2022, 10:21 AM  Recent Labs  Lab 09/03/22 0215 09/04/22 0526  HGB 8.7* 8.7*  ALBUMIN 3.3* 3.4*  CALCIUM 8.4* 9.1  PHOS 3.3 4.2  CREATININE 0.85 0.77  K 4.4 4.3   Recent Labs  Lab 09/04/22 0526  IRON 68  TIBC 294  FERRITIN 115   Inpatient medications:  carvedilol  25 mg Oral BID   enoxaparin (LOVENOX) injection  40 mg Subcutaneous Q24H   hydrALAZINE  50 mg Oral Q8H   losartan  50 mg Oral Daily   silver sulfADIAZINE   Topical Daily   sodium chloride  2 g Oral BID WC  acetaminophen **OR** acetaminophen, hydrALAZINE, labetalol, ondansetron **OR** ondansetron (ZOFRAN) IV, mouth rinse

## 2022-09-05 DIAGNOSIS — E876 Hypokalemia: Secondary | ICD-10-CM | POA: Diagnosis not present

## 2022-09-05 DIAGNOSIS — D471 Chronic myeloproliferative disease: Secondary | ICD-10-CM | POA: Diagnosis not present

## 2022-09-05 DIAGNOSIS — I160 Hypertensive urgency: Secondary | ICD-10-CM | POA: Diagnosis not present

## 2022-09-05 DIAGNOSIS — E871 Hypo-osmolality and hyponatremia: Secondary | ICD-10-CM | POA: Diagnosis not present

## 2022-09-05 LAB — BASIC METABOLIC PANEL
Anion gap: 6 (ref 5–15)
BUN: 12 mg/dL (ref 8–23)
CO2: 22 mmol/L (ref 22–32)
Calcium: 8.4 mg/dL — ABNORMAL LOW (ref 8.9–10.3)
Chloride: 97 mmol/L — ABNORMAL LOW (ref 98–111)
Creatinine, Ser: 0.79 mg/dL (ref 0.44–1.00)
GFR, Estimated: >60 mL/min (ref >60)
Glucose, Bld: 147 mg/dL — ABNORMAL HIGH (ref 70–99)
Potassium: 3.8 mmol/L (ref 3.5–5.1)
Sodium: 125 mmol/L — ABNORMAL LOW (ref 135–145)

## 2022-09-05 LAB — PHOSPHORUS: Phosphorus: 4.2 mg/dL (ref 2.5–4.6)

## 2022-09-05 LAB — MAGNESIUM: Magnesium: 2.1 mg/dL (ref 1.7–2.4)

## 2022-09-05 MED ORDER — HYDRALAZINE HCL 50 MG PO TABS
75.0000 mg | ORAL_TABLET | Freq: Three times a day (TID) | ORAL | Status: DC
  Administered 2022-09-05 – 2022-09-06 (×4): 75 mg via ORAL
  Filled 2022-09-05 (×4): qty 1

## 2022-09-05 MED ORDER — SODIUM CHLORIDE 1 G PO TABS: 2.0000 g | ORAL_TABLET | Freq: Three times a day (TID) | ORAL | 0 refills | Status: DC

## 2022-09-05 MED ORDER — LOSARTAN POTASSIUM 100 MG PO TABS: 100.0000 mg | ORAL_TABLET | Freq: Every day | ORAL | 0 refills | Status: DC

## 2022-09-05 MED ORDER — HYDRALAZINE HCL 25 MG PO TABS: 75.0000 mg | ORAL_TABLET | Freq: Three times a day (TID) | ORAL | 0 refills | Status: DC

## 2022-09-05 MED ORDER — LOSARTAN POTASSIUM 50 MG PO TABS
100.0000 mg | ORAL_TABLET | Freq: Every day | ORAL | Status: DC
  Administered 2022-09-05 – 2022-09-06 (×2): 100 mg via ORAL
  Filled 2022-09-05 (×2): qty 2

## 2022-09-05 MED ORDER — ONDANSETRON HCL 4 MG PO TABS: 4.0000 mg | ORAL_TABLET | Freq: Four times a day (QID) | ORAL | 0 refills | Status: AC | PRN

## 2022-09-05 MED ORDER — SILVER SULFADIAZINE 1 % EX CREA: TOPICAL_CREAM | Freq: Every day | CUTANEOUS | 0 refills | Status: AC

## 2022-09-05 MED ORDER — SODIUM CHLORIDE 1 G PO TABS: 2.0000 g | ORAL_TABLET | Freq: Two times a day (BID) | ORAL | 0 refills | Status: DC

## 2022-09-05 MED ORDER — AMLODIPINE BESYLATE 5 MG PO TABS: 5.0000 mg | ORAL_TABLET | Freq: Every day | ORAL | Status: DC

## 2022-09-05 MED ORDER — ACETAMINOPHEN 325 MG PO TABS: 650.0000 mg | ORAL_TABLET | Freq: Four times a day (QID) | ORAL | 0 refills | Status: AC | PRN

## 2022-09-05 MED ORDER — HYDRALAZINE HCL 50 MG PO TABS: 50.0000 mg | ORAL_TABLET | Freq: Three times a day (TID) | ORAL | 0 refills | Status: DC

## 2022-09-05 NOTE — Progress Notes (Signed)
Mobility Specialist - Progress Note   09/05/22 1040  Mobility  Activity Ambulated with assistance in hallway  Level of Assistance Modified independent, requires aide device or extra time  Assistive Device Front wheel walker  Distance Ambulated (ft) 275 ft  Activity Response Tolerated well  Mobility Referral Yes  $Mobility charge 1 Mobility   Pt received in bed and agreeable to mobility. No complaints during session. Pt to recliner after session with all needs met.    [facility]

## 2022-09-05 NOTE — Progress Notes (Signed)
Patient states staff member last night was taking her blood pressure on left arm and she complained that it was too tight. The staff member continued to take the blood pressure. Bruise present on left anterior upper forearm. Pink bracelet was applied to left arm to restrict any further blood pressures in that arm. MD made aware. Charge nurse was informed and she spoke with patient and family.
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
## 2022-09-06 LAB — CBC WITH DIFFERENTIAL/PLATELET
Abs Immature Granulocytes: 0.01 10*3/uL (ref 0.00–0.07)
Basophils Absolute: 0.1 10*3/uL (ref 0.0–0.1)
Basophils Relative: 1 %
Eosinophils Absolute: 0.1 10*3/uL (ref 0.0–0.5)
Eosinophils Relative: 2 %
HCT: 26.3 % — ABNORMAL LOW (ref 36.0–46.0)
Hemoglobin: 8.6 g/dL — ABNORMAL LOW (ref 12.0–15.0)
Immature Granulocytes: 0 %
Lymphocytes Relative: 21 %
Lymphs Abs: 1.2 10*3/uL (ref 0.7–4.0)
MCH: 31 pg (ref 26.0–34.0)
MCHC: 32.7 g/dL (ref 30.0–36.0)
MCV: 94.9 fL (ref 80.0–100.0)
Monocytes Absolute: 0.6 10*3/uL (ref 0.1–1.0)
Monocytes Relative: 11 %
Neutro Abs: 3.8 10*3/uL (ref 1.7–7.7)
Neutrophils Relative %: 65 %
Platelets: 288 10*3/uL (ref 150–400)
RBC: 2.77 MIL/uL — ABNORMAL LOW (ref 3.87–5.11)
RDW: 11.8 % (ref 11.5–15.5)
WBC: 5.8 10*3/uL (ref 4.0–10.5)
nRBC: 0 % (ref 0.0–0.2)

## 2022-09-06 LAB — MAGNESIUM: Magnesium: 2.1 mg/dL (ref 1.7–2.4)

## 2022-09-06 LAB — BASIC METABOLIC PANEL
Anion gap: 8 (ref 5–15)
BUN: 13 mg/dL (ref 8–23)
CO2: 22 mmol/L (ref 22–32)
Calcium: 8.9 mg/dL (ref 8.9–10.3)
Chloride: 98 mmol/L (ref 98–111)
Creatinine, Ser: 0.79 mg/dL (ref 0.44–1.00)
GFR, Estimated: >60 mL/min (ref >60)
Glucose, Bld: 125 mg/dL — ABNORMAL HIGH (ref 70–99)
Potassium: 4.2 mmol/L (ref 3.5–5.1)
Sodium: 128 mmol/L — ABNORMAL LOW (ref 135–145)

## 2022-09-06 LAB — PHOSPHORUS: Phosphorus: 4.2 mg/dL (ref 2.5–4.6)

## 2022-09-06 MED ORDER — AMLODIPINE BESYLATE 5 MG PO TABS
2.5000 mg | ORAL_TABLET | Freq: Every day | ORAL | Status: DC
  Administered 2022-09-06: 2.5 mg via ORAL
  Filled 2022-09-06: qty 1

## 2022-09-06 MED ORDER — AMLODIPINE BESYLATE 2.5 MG PO TABS: 2.5000 mg | ORAL_TABLET | Freq: Every day | ORAL | 0 refills | Status: DC

## 2022-09-06 MED ORDER — SODIUM CHLORIDE 1 G PO TABS: 2.0000 g | ORAL_TABLET | Freq: Two times a day (BID) | ORAL | 0 refills | Status: AC

## 2022-09-06 NOTE — Discharge Summary (Signed)
Physician Discharge Summary   Patient: Paula Griffith MRN: [id number] DOB: [birth year]  Admit date:     09/01/2022  Discharge date: 09/06/2022  Discharge Physician: Raiford Noble, DO   PCP: Cassandria Anger, MD   Recommendations at discharge:   Follow-up with PCP within 1 to 2 weeks repeat CBC, CMP, mag, Phos within 1 week Follow-up with nephrology in outpatient setting an appointment has been scheduled for you in the office will call to schedule an appointment Follow-up with cardiology in outpatient setting if necessary  Discharge Diagnoses: Principal Problem:   Hypertensive urgency Active Problems:   Acute on chronic hyponatremia   MPN (myeloproliferative neoplasm) ([place])   Hypokalemia   Open wound of skin  Resolved Problems:   * No resolved hospital problems. [facility] Course: The patient is a 77 year old chronically ill-appearing African-American female with a past medical history significant for but not limited to hypertension, prior retinal infarct, myeloproliferative neoplasm as well as other comorbidities who presented to the ED with complaints of left eye droop, numbness in her left hand and face as well as left-sided systems have been mostly intermittent for last 3 days.  Her eye droop had been constant but daughter states that comes and goes.  She was initially seen in the PCPs office the PCP was concerned for left facial weakness and paralysis as well and sent the patient to the ED.  In the ED her blood pressures were extremely high and systolics were between [id number] and 260.  Symptoms started to slowly improve after she was given hydralazine and blood pressure started to improve.  She had ongoing symptoms in the ED with left eyelid droop however any new visual changes at this time but states that this has been intermittent happening.  Further workup revealed that she had a hyponatremia and is likely SIADH from drug etiology so she has been placed on salt tablets and repeating  sodiums q6h.     Given patient's sodium has not really improved nephrology was consulted formally they have cut back her sodium tablets and will give her a dose of tolvaptan.  Nephrology believes that her hyponatremia is due to her thiazide type diuretics. BP remains elevated still so will uptitrate po Hydralazine and have now changed to every 8 50 mg.  Have added back losartan at 50 mg p.o. daily.  Nephrology recommends continuing 2 g salt tablets for another 5 days and then discontinuing.     **Today her sodium dropped again to 125 however after discussion with nephrology she is planning on being discharged however prior to discharge her blood pressure became significantly elevated so discharge has been held.  Blood pressure medications have been adjusted and her losartan has been increased to 100 mg and her hydralazine has been uptitrated to 75 mg p.o. every 8 and if blood pressures continue remain significant elevated will add back amlodipine in the morning.  She subsequently improved the next morning and blood pressure improved after the addition of amlodipine.  She will need close PCP follow-up as well as follow-up with nephrology in outpatient setting.  Sodium also trended back up prior to discharge and she will be discharged on 2 g of sodium twice daily for 5 days total  Assessment and Plan: Hypertensive urgency, improved prior to discharge -BPs running [id number] systolic today prior to treatment in ED. -MRI negative for acute stroke. -Neurology thinks her intermittent neuro symptoms are secondary to HTN urgency, does not feel she needs transfer to [facility] or  stroke work up per discussion with Dr. Rory Percy -Continue home Coreg and added hydralazine -Stop HCTZ given hyponatremia -PRN IV hydralazine and added Scheduled Hydralazine increased to 75 mg every 8 -We have discussed with nephrology and added back her home losartan 50 mg/day -Daughter to bring in pts home meds to we can figure out what exactly  she is taking and when. -"Carotid artery PVD" listed on chart; however, looks like this was always <40% bilaterally in prior years. Most recent was in 2018 however so repeat Carotid done this time.  -Repeat Carotid done and showed "Right Carotid: Velocities in the right ICA are consistent with a 1-39% stenosis. Left Carotid: Velocities in the left ICA are consistent with a 1-39%  stenosis. Vertebrals: Bilateral vertebral arteries demonstrate antegrade flow."  -Continue to monitor blood pressures per protocol -Blood pressures remains elevated still and last BP was significantly elevated so we will add IV labetalol -Added back her losartan increased to 100 mg daily now and also added amlodipine 5 mg p.o. daily -Nephrology recommends continue to monitor and feel that she needs a SBP goal of 130-160 and blood pressure is way too high for her to be safely discharged home today -Follow-up with PCP, nephrology as well as cardiology in outpatient setting and if blood pressures continue to remain elevated consider outpatient workup for pheochromocytoma as well as renal artery stenosis workup   Acute on Chronic Hyponatremia -H/o same in past. -Despite this history, looks like she is on HCTZ still so we will recommend discontinuing it and holding -Na+ Trend: Recent Labs  Lab 09/03/22 0215 09/03/22 0810 09/03/22 1353 09/03/22 2052 09/04/22 0526 09/05/22 0854 09/06/22 0821  NA 119* 120* 119* 124* 130* 125* 128*  -Appears to have SIADH and I discussed the case with nephrology Dr. Soyla Murphy is a curbside and he recommends salt tablets 2 g 3 times daily and repeating sodiums every 6 and recommended fluid restriction to 1000 mL Daily and because she did not improve further nephrology was consulted formally and they gave her a dose of tolvaptan yesterday and reduce the salt tablets from 2 g 3 times a day to 2 times a day -Follow Na+ Trend and this is improving.  Nephrology recommends continuing salt tablets and  2 g twice daily for 5 days and have listed thiazides as an intolerance   Open wound of skin -Wound care consult for the small burn wound with eschar   Hypokalemia -Patient's K+ Level Trend: Recent Labs  Lab 09/01/22 1637 09/02/22 0500 09/03/22 0215 09/04/22 0526 09/05/22 0854 09/06/22 0821  K 3.3* 3.0* 4.4 4.3 3.8 4.2  -Continue to Monitor and Replete as Necessary -Repeat CMP within 1 week   MPN (Myeloproliferative Neoplasm) (HCC) Anemia of Chronic Disease -Anemia today appears to be chronic and about her baseline for several years now. Recent Labs  Lab 09/01/22 1637 09/02/22 0500 09/03/22 0215 09/04/22 0526 09/06/22 0821  HGB 9.1* 8.7* 8.7* 8.7* 8.6*  HCT 24.7* 24.5* 25.0* 25.3* 26.3*  MCV 88.8 89.7 91.6 92.0 94.9  -Checked Anemia Panel and showed an iron level of 68, UIBC of 226, TIBC of 294, saturation ratios of 23%, ferritin level 115, folate level 35.3, vitamin B12 358 -Followed by Dr. Burr Medico and will notify her as a courtesy -Continue monitor for signs and symptoms bleeding; no overt bleeding noted -Repeat CBC within 1 week    Hypoalbuminemia -Patient's Albumin Trend: Recent Labs  Lab 09/01/22 1637 09/03/22 0215 09/04/22 0526  ALBUMIN 3.8 3.3* 3.4*  -Continue  to Monitor and Trend and repeat CMP within 1 week  Consultants: Nephrology  Procedures performed: As above  Disposition: Home Diet recommendation:  Discharge Diet Orders (From admission, onward)     Start     Ordered   09/05/22 0000  Diet - low sodium heart healthy       Comments: 1000 mL Fluid Restriction   09/05/22 1244           Cardiac diet DISCHARGE MEDICATION: Allergies as of 09/06/2022       Reactions   Thiazide-type Diuretics Other (See Comments)   Hyponatremia 119 in march 2023 (was taking HCTZ)   Benazepril Cough   cough   Amlodipine    Leg edema at 5 mg/d   Fluad [influenza Vac A&b [employer] Adj]    Very sick   Other    BAND-AID CAUSE SKIN TEARS/BRUISES   Codeine Nausea  Only   Tramadol Other (See Comments)   Dizzy, nauseated        Medication List     STOP taking these medications    hydrochlorothiazide 12.5 MG capsule Commonly known as: MICROZIDE   ketoconazole 2 % cream Commonly known as: NIZORAL   potassium chloride 10 MEQ tablet Commonly known as: Klor-Con 10   urea 15 g Pack oral packet Commonly known as: URE-NA       TAKE these medications    acetaminophen 325 MG tablet Commonly known as: TYLENOL Take 2 tablets (650 mg total) by mouth every 6 (six) hours as needed for mild pain (or Fever >/= 101).   ADULT GUMMY PO Take 2 tablets by mouth daily.   albuterol 108 (90 Base) MCG/ACT inhaler Commonly known as: ProAir HFA Inhale 2 puffs into the lungs every 4 (four) hours as needed. For shortness of breath   amLODipine 2.5 MG tablet Commonly known as: NORVASC Take 1 tablet (2.5 mg total) by mouth daily. What changed:  medication strength how much to take   anagrelide 1 MG capsule Commonly known as: AGRYLIN Take 1 capsule (1 mg total) by mouth See admin instructions. TAKE 1 CAPSULE BY MOUTH DAILY EXCEPT 2 DAYS A WEEK (TUESDAYS AND THURSDAYS) DO NOT TAKE.   aspirin EC 325 MG tablet Take 325 mg by mouth daily after breakfast.   carvedilol 25 MG tablet Commonly known as: Coreg Take 1 tablet (25 mg total) by mouth 2 (two) times daily. What changed:  how much to take when to take this   Fluticasone-Salmeterol 250-50 MCG/DOSE Aepb Commonly known as: ADVAIR Please specify directions, refills and quantity   gabapentin 100 MG capsule Commonly known as: NEURONTIN TAKE 1 CAPSULE BY MOUTH THREE TIMES A DAY What changed: See the new instructions.   hydrALAZINE 25 MG tablet Commonly known as: APRESOLINE Take 3 tablets (75 mg total) by mouth every 8 (eight) hours.   losartan 100 MG tablet Commonly known as: COZAAR Take 1 tablet (100 mg total) by mouth daily.   ondansetron 4 MG tablet Commonly known as: ZOFRAN Take 1  tablet (4 mg total) by mouth every 6 (six) hours as needed for nausea.   silver sulfADIAZINE 1 % cream Commonly known as: SILVADENE Apply topically daily.   sodium chloride 1 g tablet Take 2 tablets (2 g total) by mouth 2 (two) times daily with a meal for 5 days.   trolamine salicylate 10 % cream Commonly known as: ASPERCREME Apply 1 application topically 2 (two) times daily as needed for muscle pain.   vitamin C 1000  MG tablet Take 1,000 mg by mouth daily.   Vitamin D3 50 MCG (2000 UT) Tabs Generic drug: Cholecalciferol Take 2,000 Units by mouth daily.               Discharge Care Instructions  (From admission, onward)           Start     Ordered   09/06/22 0000  Discharge wound care:       Comments: Apply Silvadene to right posterior thigh Q day, then cover with foam dressing.  (Change foam dressing Q 3 days or PRN soiling.)   09/06/22 1203   09/05/22 0000  Discharge wound care:       Comments: Apply Silvadene to right posterior thigh Q day, then cover with foam dressing.  (Change foam dressing Q 3 days or PRN soiling.)   09/05/22 1244            Discharge Exam: Filed Weights   09/04/22 0435 09/05/22 0500 09/06/22 0500  Weight: 57.5 kg 58.8 kg 60.7 kg   Vitals:   09/06/22 0809 09/06/22 1141  BP: (!) 184/82 (!) 166/73  Pulse: 81   Resp: 20   Temp: 97.8 F (36.6 C)   SpO2: 97%    Examination: Physical Exam:  Constitutional: WN/WD elderly African-American female in no acute distress appears calm Respiratory: Diminished to auscultation bilaterally, no wheezing, rales, rhonchi or crackles. Normal respiratory effort and patient is not tachypenic. No accessory muscle use.  Unlabored breathing Cardiovascular: RRR, no murmurs / rubs / gallops. S1 and S2 auscultated. No extremity edema.  Abdomen: Soft, non-tender, non-distended. Bowel sounds positive.  GU: Deferred. Musculoskeletal: No clubbing / cyanosis of digits/nails. No joint deformity upper and  lower extremities.  Skin: No rashes, lesions, ulcers or skin evaluation. No induration; Warm and dry.  Neurologic: CN 2-12 grossly intact with no focal deficits. Romberg sign and cerebellar reflexes not assessed.  Psychiatric: Normal judgment and insight. Alert and oriented x 3. Normal mood and appropriate affect.   Condition at discharge: stable  The results of significant diagnostics from this hospitalization (including imaging, microbiology, ancillary and laboratory) are listed below for reference.   Imaging Studies: VAS US CAROTID  Result Date: 09/02/2022 Carotid Arterial Duplex Study Patient Name:  Paula Griffith  Date of Exam:   09/02/2022 Medical Rec #: [id number]       Accession #:    [id number] Date of Birth: 03/24/1946       Patient Gender: F Patient Age:   89 years Exam Location:  [facility] Procedure:      VAS US CAROTID Referring Phys: Jennette Kettle --------------------------------------------------------------------------------  Indications:       Carotid stenosis-bilateral I65.23. Risk Factors:      Hypertension. Comparison Study:  No prior studies. Performing Technologist: Oliver Hum RVT  Examination Guidelines: A complete evaluation includes B-mode imaging, spectral Doppler, color Doppler, and power Doppler as needed of all accessible portions of each vessel. Bilateral testing is considered an integral part of a complete examination. Limited examinations for reoccurring indications may be performed as noted.  Right Carotid Findings: +----------+--------+--------+--------+-----------------------+--------+           PSV cm/sEDV cm/sStenosisPlaque Description     Comments +----------+--------+--------+--------+-----------------------+--------+ CCA Prox  150     29              smooth and heterogenous         +----------+--------+--------+--------+-----------------------+--------+ CCA Distal103     22  smooth and heterogenous          +----------+--------+--------+--------+-----------------------+--------+ ICA Prox  93      23              smooth and heterogenous         +----------+--------+--------+--------+-----------------------+--------+ ICA Mid   120     36                                              +----------+--------+--------+--------+-----------------------+--------+ ICA Distal117     35                                     tortuous +----------+--------+--------+--------+-----------------------+--------+ ECA       128     13                                              +----------+--------+--------+--------+-----------------------+--------+ +----------+--------+-------+--------+-------------------+           PSV cm/sEDV cmsDescribeArm Pressure (mmHG) +----------+--------+-------+--------+-------------------+ Subclavian215                                        +----------+--------+-------+--------+-------------------+ +---------+--------+---+--------+--+---------+ VertebralPSV cm/s107EDV cm/s34Antegrade +---------+--------+---+--------+--+---------+  Left Carotid Findings: +----------+--------+--------+--------+-----------------------+--------+           PSV cm/sEDV cm/sStenosisPlaque Description     Comments +----------+--------+--------+--------+-----------------------+--------+ CCA Prox  133     29                                              +----------+--------+--------+--------+-----------------------+--------+ CCA Distal115     23              smooth and heterogenous         +----------+--------+--------+--------+-----------------------+--------+ ICA Prox  80      24              smooth and heterogenous         +----------+--------+--------+--------+-----------------------+--------+ ICA Mid   119     39                                              +----------+--------+--------+--------+-----------------------+--------+ ICA Distal111     36                                      tortuous +----------+--------+--------+--------+-----------------------+--------+ ECA       122     21                                              +----------+--------+--------+--------+-----------------------+--------+ +----------+--------+--------+--------+-------------------+           PSV cm/sEDV cm/sDescribeArm Pressure (mmHG) +----------+--------+--------+--------+-------------------+ Subclavian209                                         +----------+--------+--------+--------+-------------------+ +---------+--------+---+--------+--+---------+  [place] cm/s133EDV cm/s30Antegrade +---------+--------+---+--------+--+---------+   Summary: Right Carotid: Velocities in the right ICA are consistent with a 1-39% stenosis. Left Carotid: Velocities in the left ICA are consistent with a 1-39% stenosis. Vertebrals: Bilateral vertebral arteries demonstrate antegrade flow. *See table(s) above for measurements and observations.  Electronically signed by Jamelle Haring on 09/02/2022 at 12:36:42 PM.    Final    MR BRAIN WO CONTRAST  Result Date: 09/01/2022 CLINICAL DATA:  Neuro deficit with acute stroke suspected. Left hand numbness. Facial droop EXAM: MRI HEAD WITHOUT CONTRAST TECHNIQUE: Multiplanar, multiecho pulse sequences of the brain and surrounding structures were obtained without intravenous contrast. COMPARISON:  04/26/2022 head CT FINDINGS: Brain: No acute infarction, hemorrhage, hydrocephalus, extra-axial collection or mass lesion. Patchy FLAIR hyperintensity in the cerebral white matter attributed to chronic small vessel ischemia. There is cortical to juxtacortical remote insult along the right frontal parietal convexity. Brain volume is normal for age. Vascular: Normal flow voids. Skull and upper cervical spine: Cervical fusion at C3-4. No evidence of bone lesion. Sinuses/Orbits: Bilateral cataract resection. Secretions seen in the sphenoid sinuses.  IMPRESSION: 1. No acute finding including infarct. 2. Moderate chronic small vessel ischemia in the cerebral white matter. Electronically Signed   By: Jorje Guild M.D.   On: 09/01/2022 20:29    Microbiology: Results for orders placed or performed in visit on 02/03/22  WOUND CULTURE     Status: None   Collection Time: 02/03/22  2:50 PM   Specimen: Wound  Result Value Ref Range Status   Source: WOUND (SITE NOT SPECIFIED)  Final   Status: FINAL  Final   Gram Stain:   Final    No white blood cells seen No epithelial cells seen Many Gram negative bacilli   RESULT:   Final    A mix of organisms of questionable significance was recovered on culture and not further identified. (Note: Growth did not detect the presence of S.aureus, beta-hemolytic Streptococci or P.aeruginosa).   Labs: CBC: Recent Labs  Lab 09/01/22 1637 09/02/22 0500 09/03/22 0215 09/04/22 0526 09/06/22 0821  WBC 5.5 6.5 6.9 5.7 5.8  NEUTROABS 3.4  --  4.3 3.6 3.8  HGB 9.1* 8.7* 8.7* 8.7* 8.6*  HCT 24.7* 24.5* 25.0* 25.3* 26.3*  MCV 88.8 89.7 91.6 92.0 94.9  PLT 239 242 213 206 [id number]   Basic Metabolic Panel: Recent Labs  Lab 09/02/22 0500 09/02/22 2038 09/03/22 0215 09/03/22 0810 09/03/22 1353 09/03/22 2052 09/04/22 0526 09/05/22 0854 09/06/22 0821  NA 121*   < > 119*   < > 119* 124* 130* 125* 128*  K 3.0*  --  4.4  --   --   --  4.3 3.8 4.2  CL 88*  --  92*  --   --   --  96* 97* 98  CO2 25  --  23  --   --   --  '24 22 22  '$ GLUCOSE 102*  --  97  --   --   --  104* 147* 125*  BUN 8  --  10  --   --   --  '13 12 13  '$ CREATININE 0.65  --  0.85  --   --   --  0.77 0.79 0.79  CALCIUM 8.5*  --  8.4*  --   --   --  9.1 8.4* 8.9  MG  --   --  1.7  --   --   --  2.3 2.1 2.1  PHOS  --   --  3.3  --   --   --  4.2 4.2 4.2   < > = values in this interval not displayed.   Liver Function Tests: Recent Labs  Lab 09/01/22 1637 09/03/22 0215 09/04/22 0526  AST '29 23 20  '$ ALT '8 7 8  '$ ALKPHOS 43 40 39  BILITOT 0.6  0.6 0.6  PROT 7.0 6.0* 6.1*  ALBUMIN 3.8 3.3* 3.4*   CBG: No results for input(s): "GLUCAP" in the last 168 hours.  Discharge time spent: greater than 30 minutes.  Signed: Raiford Noble, DO [facility] 09/06/2022

## 2022-09-06 NOTE — Progress Notes (Signed)
Mobility Specialist - Progress Note   09/06/22 0956  Mobility  Activity Ambulated with assistance in hallway  Level of Assistance Modified independent, requires aide device or extra time  Assistive Device Front wheel walker  Distance Ambulated (ft) 275 ft  Activity Response Tolerated well  Mobility Referral Yes  $Mobility charge 1 Mobility   Pt received EOB and agreeable to mobility. No complaints during session. Pt to EOB after session with all needs met & daughter in room.   [facility]

## 2022-09-06 NOTE — Progress Notes (Signed)
Patient discharged: Home with family  Via: Wheelchair   Discharge paperwork given: to patient and family  Reviewed with teach back  IV and telemetry disconnected  Belongings given to patient    

## 2022-09-06 NOTE — Plan of Care (Signed)
  Problem: Clinical Measurements: Goal: Ability to maintain clinical measurements within normal limits will improve Outcome: Progressing   Problem: Safety: Goal: Ability to remain free from injury will improve Outcome: Progressing   Problem: Pain Managment: Goal: General experience of comfort will improve Outcome: Progressing   Problem: Skin Integrity: Goal: Risk for impaired skin integrity will decrease Outcome: Progressing

## 2022-09-07 ENCOUNTER — Telehealth: Payer: Self-pay | Admitting: *Deleted

## 2022-09-07 ENCOUNTER — Encounter: Payer: Self-pay | Admitting: *Deleted

## 2022-09-07 NOTE — Transitions of Care (Post Inpatient/ED Visit) (Signed)
09/07/2022  Name: Paula Griffith MRN: [id number] DOB: 26-Jul-1945  Today's TOC FU Call Status: Today's TOC FU Call Status:: Successful TOC FU Call Competed TOC FU Call Complete Date: 09/07/22  Transition Care Management Follow-up Telephone Call Date of Discharge: 09/06/22 Discharge Facility: [facility]) Type of Discharge: Inpatient Admission Primary Inpatient Discharge Diagnosis:: HTN urgency; eyelid drooping How have you been since you were released from the hospital?: Better ("I am doing much better; my daughters are here helping me with anything I need, but I am able to do about everything I need to on my own.  My blood pressure this morning was 188/77, but I have not started the new BP medications yet") Any questions or concerns?: Yes Patient Questions/Concerns:: "We have not yet picked up all of her medications, we went to [facility] and they are preparing them, we'll pick them up as soon as they call us to tell us they are ready; these medications are a little bit confusing, thanks for your explanations" Patient Questions/Concerns Addressed: Other: (thoroughly reviewed all newly prescribed medications post-hospital discharge with patient and caregiver/ daughter; discussed need to continue monitoring/ recording blood pressures at home; see full interventions below)  Items Reviewed: Did you receive and understand the discharge instructions provided?: Yes (thoroughly reviewed with patient and caregiver/ daughter who verbalize good understanding of same) Medications obtained and verified?: Yes (Medications Reviewed) (full medication review completed; no concerns or discrepancies identified; confirmed patient has communicated with PO pharmacy and is waiting on all newly Rx'd medications to be prepared; denies current concerns around medications today) Any new allergies since your discharge?: No Dietary orders reviewed?: Yes Type of Diet Ordered:: Heart Healthy Do you have support at home?:  Yes People in Home: child(ren), adult Name of Support/Comfort Primary Source: daughters assist with care needs as needed/ indicated; patient reports she is essentially independent in self-care activities  [place] and Equipment/Supplies: [place] Ordered?: No Any new equipment or medical supplies ordered?: No  Functional Questionnaire: Do you need assistance with bathing/showering or dressing?: No Do you need assistance with meal preparation?: No (daughters assist as indicated) Do you need assistance with eating?: No Do you have difficulty maintaining continence: No Do you need assistance with getting out of bed/getting out of a chair/moving?: No Do you have difficulty managing or taking your medications?: No (daughters assist as indicated)  Folllow up appointments reviewed: PCP Follow-up appointment confirmed?: Yes Date of PCP follow-up appointment?: 09/16/22 Follow-up Provider: PCP [facility] Follow-up appointment confirmed?: No Reason Specialist Follow-Up Not Confirmed: Patient has Specialist Provider Number and will Call for Appointment Do you need transportation to your follow-up appointment?: No Do you understand care options if your condition(s) worsen?: Yes-patient verbalized understanding  SDOH Interventions Today    Flowsheet Row Most Recent Value  SDOH Interventions   Food Insecurity Interventions Intervention Not Indicated  Transportation Interventions Intervention Not Indicated  [family provides transportation]      TOC Interventions Today    Flowsheet Row Most Recent Value  TOC Interventions   TOC Interventions Discussed/Reviewed TOC Interventions Discussed  [reviewed BP's at home post-recent hospital dsicharge,  provided my direct contact information should questions/ concerns/ needs arise post-TOC call, prior to RN CM telephone visit]      Interventions Today    Flowsheet Row Most Recent Value  Chronic Disease   Chronic disease  during today's visit Hypertension (HTN)  General Interventions   General Interventions Discussed/Reviewed General Interventions Discussed, Doctor Visits, Referral to Nurse,  Communication with  Doctor Visits Discussed/Reviewed Doctor Visits Discussed, Specialist, PCP  [needs to schedule post-hospital follow up visit with nephrologist: patient states she will do]  PCP/Specialist Visits Compliance with follow-up visit  Communication with RN  Education Interventions   Education Provided Provided Education  Provided Verbal Education On Other, When to see the doctor, Medication  [general BP guidelines,  importance of monitoring/ recording BP's at home-- need to take to upcoming PCP office visit for review,  extensive education around newly Rx'd medications and their purpose/ need to obtain and start taking promptly]  Nutrition Interventions   Nutrition Discussed/Reviewed Nutrition Discussed  Pharmacy Interventions   Pharmacy Dicussed/Reviewed Pharmacy Topics Discussed  [extensive education around newly Rx'd medications and their purpose/ need to obtain and start taking promptly once ready for pick up at OP pharmacy]      Oneta Rack, RN, BSN, [place] RN CM Care Coordination/ Transition of [place] Management [phone]: direct office

## 2022-09-16 ENCOUNTER — Ambulatory Visit: Payer: Medicare PPO | Admitting: Internal Medicine

## 2022-09-16 ENCOUNTER — Encounter: Payer: Self-pay | Admitting: Internal Medicine

## 2022-09-16 VITALS — BP 140/76 | HR 80 | Temp 98.7°F | Ht 63.0 in | Wt 131.0 lb

## 2022-09-16 DIAGNOSIS — I160 Hypertensive urgency: Secondary | ICD-10-CM

## 2022-09-16 DIAGNOSIS — E876 Hypokalemia: Secondary | ICD-10-CM | POA: Diagnosis not present

## 2022-09-16 DIAGNOSIS — D759 Disease of blood and blood-forming organs, unspecified: Secondary | ICD-10-CM

## 2022-09-16 LAB — COMPREHENSIVE METABOLIC PANEL
ALT: 5 U/L (ref 0–35)
AST: 16 U/L (ref 0–37)
Albumin: 3.7 g/dL (ref 3.5–5.2)
Alkaline Phosphatase: 42 U/L (ref 39–117)
BUN: 11 mg/dL (ref 6–23)
CO2: 26 mEq/L (ref 19–32)
Calcium: 9.3 mg/dL (ref 8.4–10.5)
Chloride: 97 mEq/L (ref 96–112)
Creatinine, Ser: 0.74 mg/dL (ref 0.40–1.20)
GFR: 78.31 mL/min (ref >60.00)
Glucose, Bld: 126 mg/dL — ABNORMAL HIGH (ref 70–99)
Potassium: 3.6 mEq/L (ref 3.5–5.1)
Sodium: 130 mEq/L — ABNORMAL LOW (ref 135–145)
Total Bilirubin: 0.4 mg/dL (ref 0.2–1.2)
Total Protein: 6.6 g/dL (ref 6.0–8.3)

## 2022-09-16 LAB — CBC WITH DIFFERENTIAL/PLATELET
Basophils Absolute: 0.1 10*3/uL (ref 0.0–0.1)
Basophils Relative: 1 % (ref 0.0–3.0)
Eosinophils Absolute: 0.3 10*3/uL (ref 0.0–0.7)
Eosinophils Relative: 5 % (ref 0.0–5.0)
HCT: 25.4 % — ABNORMAL LOW (ref 36.0–46.0)
Hemoglobin: 8.8 g/dL — ABNORMAL LOW (ref 12.0–15.0)
Lymphocytes Relative: 16.8 % (ref 12.0–46.0)
Lymphs Abs: 0.9 10*3/uL (ref 0.7–4.0)
MCHC: 34.5 g/dL (ref 30.0–36.0)
MCV: 93.1 fl (ref 78.0–100.0)
Monocytes Absolute: 0.6 10*3/uL (ref 0.1–1.0)
Monocytes Relative: 11 % (ref 3.0–12.0)
Neutro Abs: 3.6 10*3/uL (ref 1.4–7.7)
Neutrophils Relative %: 66.2 % (ref 43.0–77.0)
Platelets: 439 10*3/uL — ABNORMAL HIGH (ref 150.0–400.0)
RBC: 2.71 Mil/uL — ABNORMAL LOW (ref 3.87–5.11)
RDW: 12.1 % (ref 11.5–15.5)
WBC: 5.5 10*3/uL (ref 4.0–10.5)

## 2022-09-16 LAB — MAGNESIUM: Magnesium: 1.9 mg/dL (ref 1.5–2.5)

## 2022-09-16 NOTE — Assessment & Plan Note (Signed)
NAS diet Meds reviewed

## 2022-09-16 NOTE — Progress Notes (Signed)
Subjective:  Patient ID: Paula Griffith, female    DOB: [birth year]  Age: 77 y.o. MRN: [id number]  CC: Follow-up (3 [facility] F/U)   HPI Paula Griffith presents for post-hospital f/u HTN, Low K, MF  Per hx: " Admit date:     09/01/2022  Discharge date: 09/06/2022  Discharge Physician: Raiford Noble, DO    PCP: Cassandria Anger, MD    Recommendations at discharge:    Follow-up with PCP within 1 to 2 weeks repeat CBC, CMP, mag, Phos within 1 week Follow-up with nephrology in outpatient setting an appointment has been scheduled for you in the office will call to schedule an appointment Follow-up with cardiology in outpatient setting if necessary   Discharge Diagnoses: Principal Problem:   Hypertensive urgency Active Problems:   Acute on chronic hyponatremia   MPN (myeloproliferative neoplasm) ([place])   Hypokalemia   Open wound of skin   Resolved Problems:   * No resolved hospital problems. [facility] Course: The patient is a 77 year old chronically ill-appearing African-American female with a past medical history significant for but not limited to hypertension, prior retinal infarct, myeloproliferative neoplasm as well as other comorbidities who presented to the ED with complaints of left eye droop, numbness in her left hand and face as well as left-sided systems have been mostly intermittent for last 3 days.  Her eye droop had been constant but daughter states that comes and goes.  She was initially seen in the PCPs office the PCP was concerned for left facial weakness and paralysis as well and sent the patient to the ED.  In the ED her blood pressures were extremely high and systolics were between [id number] and 260.  Symptoms started to slowly improve after she was given hydralazine and blood pressure started to improve.  She had ongoing symptoms in the ED with left eyelid droop however any new visual changes at this time but states that this has been intermittent happening.  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     Given patient's sodium has not really improved nephrology was consulted formally they have cut back her sodium tablets and will give her a dose of tolvaptan.  Nephrology believes that her hyponatremia is due to her thiazide type diuretics. BP remains elevated still so will uptitrate po Hydralazine and have now changed to every 8 50 mg.  Have added back losartan at 50 mg p.o. daily.  Nephrology recommends continuing 2 g salt tablets for another 5 days and then discontinuing.     **Today her sodium dropped again to 125 however after discussion with nephrology she is planning on being discharged however prior to discharge her blood pressure became significantly elevated so discharge has been held.  Blood pressure medications have been adjusted and her losartan has been increased to 100 mg and her hydralazine has been uptitrated to 75 mg p.o. every 8 and if blood pressures continue remain significant elevated will add back amlodipine in the morning.   She subsequently improved the next morning and blood pressure improved after the addition of amlodipine.  She will need close PCP follow-up as well as follow-up with nephrology in outpatient setting.  Sodium also trended back up prior to discharge and she will be discharged on 2 g of sodium twice daily for 5 days total   Assessment and Plan: Hypertensive urgency, improved  prior to discharge -BPs running [id number] systolic today prior to treatment in ED. -MRI negative for acute stroke. -Neurology thinks her intermittent neuro symptoms are secondary to HTN urgency, does not feel she needs transfer to [facility] or stroke work up per discussion with Dr. Rory Percy -Continue home Coreg and added hydralazine -Stop HCTZ given hyponatremia -PRN IV hydralazine and added Scheduled Hydralazine increased to 75 mg every 8 -We have discussed  with nephrology and added back her home losartan 50 mg/day -Daughter to bring in pts home meds to we can figure out what exactly she is taking and when. -"Carotid artery PVD" listed on chart; however, looks like this was always <40% bilaterally in prior years. Most recent was in 2018 however so repeat Carotid done this time.  -Repeat Carotid done and showed "Right Carotid: Velocities in the right ICA are consistent with a 1-39% stenosis. Left Carotid: Velocities in the left ICA are consistent with a 1-39%  stenosis. Vertebrals: Bilateral vertebral arteries demonstrate antegrade flow."  -Continue to monitor blood pressures per protocol -Blood pressures remains elevated still and last BP was significantly elevated so we will add IV labetalol -Added back her losartan increased to 100 mg daily now and also added amlodipine 5 mg p.o. daily -Nephrology recommends continue to monitor and feel that she needs a SBP goal of 130-160 and blood pressure is way too high for her to be safely discharged home today -Follow-up with PCP, nephrology as well as cardiology in outpatient setting and if blood pressures continue to remain elevated consider outpatient workup for pheochromocytoma as well as renal artery stenosis workup   Acute on Chronic Hyponatremia -H/o same in past. -Despite this history, looks like she is on HCTZ still so we will recommend discontinuing it and holding -Na+ Trend: Last Labs           Recent Labs  Lab 09/03/22 0215 09/03/22 0810 09/03/22 1353 09/03/22 2052 09/04/22 0526 09/05/22 0854 09/06/22 0821  NA 119* 120* 119* 124* 130* 125* 128*    -Appears to have SIADH and I discussed the case with nephrology Dr. Soyla Murphy is a curbside and he recommends salt tablets 2 g 3 times daily and repeating sodiums every 6 and recommended fluid restriction to 1000 mL Daily and because she did not improve further nephrology was consulted formally and they gave her a dose of tolvaptan yesterday and  reduce the salt tablets from 2 g 3 times a day to 2 times a day -Follow Na+ Trend and this is improving.  Nephrology recommends continuing salt tablets and 2 g twice daily for 5 days and have listed thiazides as an intolerance   Open wound of skin -Wound care consult for the small burn wound with eschar   Hypokalemia -Patient's K+ Level Trend: Last Labs          Recent Labs  Lab 09/01/22 1637 09/02/22 0500 09/03/22 0215 09/04/22 0526 09/05/22 0854 09/06/22 0821  K 3.3* 3.0* 4.4 4.3 3.8 4.2    -Continue to Monitor and Replete as Necessary -Repeat CMP within 1 week   MPN (Myeloproliferative Neoplasm) (HCC) Anemia of Chronic Disease -Anemia today appears to be chronic and about her baseline for several years now. Last Labs         Recent Labs  Lab 09/01/22 1637 09/02/22 0500 09/03/22 0215 09/04/22 0526 09/06/22 0821  HGB 9.1* 8.7* 8.7* 8.7* 8.6*  HCT 24.7* 24.5* 25.0* 25.3* 26.3*  MCV 88.8 89.7 91.6 92.0 94.9    -Checked Anemia Panel  and showed an iron level of 68, UIBC of 226, TIBC of 294, saturation ratios of 23%, ferritin level 115, folate level 35.3, vitamin B12 358 -Followed by Dr. Burr Medico and will notify her as a courtesy -Continue monitor for signs and symptoms bleeding; no overt bleeding noted -Repeat CBC within 1 week    Hypoalbuminemia -Patient's Albumin Trend: Last Labs       Recent Labs  Lab 09/01/22 1637 09/03/22 0215 09/04/22 0526  ALBUMIN 3.8 3.3* 3.4*    -Continue to Monitor and Trend and repeat CMP within 1 week   Consultants: Nephrology  Procedures performed: As above  Disposition: Home Diet recommendation:  Discharge Diet Orders (From admission, onward)        Start     Ordered    09/05/22 0000   Diet - low sodium heart healthy       Comments: 1000 mL Fluid Restriction   09/05/22 1244              "   Outpatient Medications Prior to Visit  Medication Sig Dispense Refill   acetaminophen (TYLENOL) 325 MG tablet Take 2 tablets  (650 mg total) by mouth every 6 (six) hours as needed for mild pain (or Fever >/= 101). 20 tablet 0   albuterol (PROAIR HFA) 108 (90 Base) MCG/ACT inhaler Inhale 2 puffs into the lungs every 4 (four) hours as needed. For shortness of breath 18 g 5   amLODipine (NORVASC) 2.5 MG tablet Take 1 tablet (2.5 mg total) by mouth daily. 30 tablet 0   anagrelide (AGRYLIN) 1 MG capsule Take 1 capsule (1 mg total) by mouth See admin instructions. TAKE 1 CAPSULE BY MOUTH DAILY EXCEPT 2 DAYS A WEEK (TUESDAYS AND THURSDAYS) DO NOT TAKE. 120 capsule 1   Ascorbic Acid (VITAMIN C) 1000 MG tablet Take 1,000 mg by mouth daily.     aspirin 325 MG EC tablet Take 325 mg by mouth daily after breakfast.     carvedilol (COREG) 25 MG tablet Take 1 tablet (25 mg total) by mouth 2 (two) times daily. (Patient taking differently: Take 50 mg by mouth daily.) 60 tablet 11   Cholecalciferol (VITAMIN D3) 2000 units TABS Take 2,000 Units by mouth daily.     Fluticasone-Salmeterol (ADVAIR) 250-50 MCG/DOSE AEPB Please specify directions, refills and quantity 1 each 11   gabapentin (NEURONTIN) 100 MG capsule TAKE 1 CAPSULE BY MOUTH THREE TIMES A DAY (Patient taking differently: Take 100 mg by mouth 3 (three) times daily.) 270 capsule 1   hydrALAZINE (APRESOLINE) 25 MG tablet Take 3 tablets (75 mg total) by mouth every 8 (eight) hours. 270 tablet 0   losartan (COZAAR) 100 MG tablet Take 1 tablet (100 mg total) by mouth daily. 30 tablet 0   Multiple Vitamins-Minerals (ADULT GUMMY PO) Take 2 tablets by mouth daily.     ondansetron (ZOFRAN) 4 MG tablet Take 1 tablet (4 mg total) by mouth every 6 (six) hours as needed for nausea. 20 tablet 0   silver sulfADIAZINE (SILVADENE) 1 % cream Apply topically daily. 50 g 0   trolamine salicylate (ASPERCREME) 10 % cream Apply 1 application topically 2 (two) times daily as needed for muscle pain.     No facility-administered medications prior to visit.    ROS: Review of Systems  Constitutional:   Positive for fatigue. Negative for activity change, appetite change, chills and unexpected weight change.  HENT:  Negative for congestion, mouth sores and sinus pressure.   Eyes:  Negative for  visual disturbance.  Respiratory:  Negative for cough and chest tightness.   Gastrointestinal:  Negative for abdominal pain and nausea.  Genitourinary:  Negative for difficulty urinating, frequency and vaginal pain.  Musculoskeletal:  Positive for gait problem. Negative for back pain.  Skin:  Negative for pallor and rash.  Neurological:  Negative for dizziness, tremors, weakness, numbness and headaches.  Psychiatric/Behavioral:  Negative for confusion, sleep disturbance and suicidal ideas.     Objective:  BP (!) 140/76 (BP Location: Left Arm, Patient Position: Sitting, Cuff Size: Normal)   Pulse 80   Temp 98.7 F (37.1 C) (Oral)   Ht 5\' 3"  (1.6 m)   Wt 131 lb (59.4 kg)   SpO2 97%   BMI 23.21 kg/m   BP Readings from Last 3 Encounters:  09/16/22 (!) 140/76  09/06/22 (!) 166/73  09/01/22 (!) 182/100    Wt Readings from Last 3 Encounters:  09/16/22 131 lb (59.4 kg)  09/06/22 133 lb 13.1 oz (60.7 kg)  09/01/22 131 lb (59.4 kg)    Physical Exam Constitutional:      General: She is not in acute distress.    Appearance: Normal appearance. She is well-developed.  HENT:     Head: Normocephalic.     Right Ear: External ear normal.     Left Ear: External ear normal.     Nose: Nose normal.  Eyes:     General:        Right eye: No discharge.        Left eye: No discharge.     Conjunctiva/sclera: Conjunctivae normal.     Pupils: Pupils are equal, round, and reactive to light.  Neck:     Thyroid: No thyromegaly.     Vascular: No JVD.     Trachea: No tracheal deviation.  Cardiovascular:     Rate and Rhythm: Normal rate and regular rhythm.     Heart sounds: Normal heart sounds.  Pulmonary:     Effort: No respiratory distress.     Breath sounds: No stridor. No wheezing.  Abdominal:      General: Bowel sounds are normal. There is no distension.     Palpations: Abdomen is soft. There is no mass.     Tenderness: There is no abdominal tenderness. There is no guarding or rebound.  Musculoskeletal:        General: No tenderness.     Cervical back: Normal range of motion and neck supple. No rigidity.  Lymphadenopathy:     Cervical: No cervical adenopathy.  Skin:    Findings: No erythema or rash.  Neurological:     Cranial Nerves: No cranial nerve deficit.     Motor: No abnormal muscle tone.     Coordination: Coordination normal.     Deep Tendon Reflexes: Reflexes normal.  Psychiatric:        Behavior: Behavior normal.        Thought Content: Thought content normal.        Judgment: Judgment normal.     Lab Results  Component Value Date   WBC 5.5 09/16/2022   HGB 8.8 Repeated and verified X2. (L) 09/16/2022   HCT 25.4 (L) 09/16/2022   PLT 439.0 (H) 09/16/2022   GLUCOSE 126 (H) 09/16/2022   CHOL 158 03/01/2018   TRIG 84.0 03/01/2018   HDL 47.10 03/01/2018   LDLCALC 94 03/01/2018   ALT 5 09/16/2022   AST 16 09/16/2022   NA 130 (L) 09/16/2022   K 3.6 09/16/2022  CL 97 09/16/2022   CREATININE 0.74 09/16/2022   BUN 11 09/16/2022   CO2 26 09/16/2022   TSH 1.975 09/03/2022   INR 1.0 09/01/2022   HGBA1C 5.4 04/28/2021    VAS US CAROTID  Result Date: 09/02/2022 Carotid Arterial Duplex Study Patient Name:  Paula Griffith  Date of Exam:   09/02/2022 Medical Rec #: [id number]       Accession #:    [id number] Date of Birth: [birth year]       Patient Gender: F Patient Age:   48 years Exam Location:  [facility] Procedure:      VAS US CAROTID Referring Phys: Jennette Kettle --------------------------------------------------------------------------------  Indications:       Carotid stenosis-bilateral I65.23. Risk Factors:      Hypertension. Comparison Study:  No prior studies. Performing Technologist: Oliver Hum RVT  Examination Guidelines: A complete evaluation  includes B-mode imaging, spectral Doppler, color Doppler, and power Doppler as needed of all accessible portions of each vessel. Bilateral testing is considered an integral part of a complete examination. Limited examinations for reoccurring indications may be performed as noted.  Right Carotid Findings: +----------+--------+--------+--------+-----------------------+--------+           PSV cm/sEDV cm/sStenosisPlaque Description     Comments +----------+--------+--------+--------+-----------------------+--------+ CCA Prox  150     29              smooth and heterogenous         +----------+--------+--------+--------+-----------------------+--------+ CCA Distal103     22              smooth and heterogenous         +----------+--------+--------+--------+-----------------------+--------+ ICA Prox  93      23              smooth and heterogenous         +----------+--------+--------+--------+-----------------------+--------+ ICA Mid   120     36                                              +----------+--------+--------+--------+-----------------------+--------+ ICA Distal117     35                                     tortuous +----------+--------+--------+--------+-----------------------+--------+ ECA       128     13                                              +----------+--------+--------+--------+-----------------------+--------+ +----------+--------+-------+--------+-------------------+           PSV cm/sEDV cmsDescribeArm Pressure (mmHG) +----------+--------+-------+--------+-------------------+ Subclavian215                                        +----------+--------+-------+--------+-------------------+ +---------+--------+---+--------+--+---------+ VertebralPSV cm/s107EDV cm/s34Antegrade +---------+--------+---+--------+--+---------+  Left Carotid Findings: +----------+--------+--------+--------+-----------------------+--------+           PSV  cm/sEDV cm/sStenosisPlaque Description     Comments +----------+--------+--------+--------+-----------------------+--------+ CCA Prox  133     29                                              +----------+--------+--------+--------+-----------------------+--------+  CCA Distal115     23              smooth and heterogenous         +----------+--------+--------+--------+-----------------------+--------+ ICA Prox  80      24              smooth and heterogenous         +----------+--------+--------+--------+-----------------------+--------+ ICA Mid   119     39                                              +----------+--------+--------+--------+-----------------------+--------+ ICA Distal111     36                                     tortuous +----------+--------+--------+--------+-----------------------+--------+ ECA       122     21                                              +----------+--------+--------+--------+-----------------------+--------+ +----------+--------+--------+--------+-------------------+           PSV cm/sEDV cm/sDescribeArm Pressure (mmHG) +----------+--------+--------+--------+-------------------+ Subclavian209                                         +----------+--------+--------+--------+-------------------+ +---------+--------+---+--------+--+---------+ VertebralPSV cm/s133EDV cm/s30Antegrade +---------+--------+---+--------+--+---------+   Summary: Right Carotid: Velocities in the right ICA are consistent with a 1-39% stenosis. Left Carotid: Velocities in the left ICA are consistent with a 1-39% stenosis. Vertebrals: Bilateral vertebral arteries demonstrate antegrade flow. *See table(s) above for measurements and observations.  Electronically signed by Jamelle Haring on 09/02/2022 at 12:36:42 PM.    Final    MR BRAIN WO CONTRAST  Result Date: 09/01/2022 CLINICAL DATA:  Neuro deficit with acute stroke suspected. Left hand numbness.  Facial droop EXAM: MRI HEAD WITHOUT CONTRAST TECHNIQUE: Multiplanar, multiecho pulse sequences of the brain and surrounding structures were obtained without intravenous contrast. COMPARISON:  04/26/2022 head CT FINDINGS: Brain: No acute infarction, hemorrhage, hydrocephalus, extra-axial collection or mass lesion. Patchy FLAIR hyperintensity in the cerebral white matter attributed to chronic small vessel ischemia. There is cortical to juxtacortical remote insult along the right frontal parietal convexity. Brain volume is normal for age. Vascular: Normal flow voids. Skull and upper cervical spine: Cervical fusion at C3-4. No evidence of bone lesion. Sinuses/Orbits: Bilateral cataract resection. Secretions seen in the sphenoid sinuses. IMPRESSION: 1. No acute finding including infarct. 2. Moderate chronic small vessel ischemia in the cerebral white matter. Electronically Signed   By: Jorje Guild M.D.   On: 09/01/2022 20:29    Assessment & Plan:   Problem List Items Addressed This Visit       Cardiovascular and Mediastinum   Hypertensive urgency    NAS diet Meds reviewed         Other   Hypokalemia - Primary   Relevant Orders   CBC with Differential/Platelet (Completed)   Comprehensive metabolic panel (Completed)   Magnesium (Completed)   Disease of blood and blood forming organ   Relevant Orders   CBC with Differential/Platelet (Completed)   Comprehensive metabolic panel (Completed)  Magnesium (Completed)      No orders of the defined types were placed in this encounter.     Follow-up: Return in about 3 months (around 12/17/2022) for a follow-up visit.  [name], MD

## 2022-09-21 ENCOUNTER — Ambulatory Visit: Payer: Self-pay

## 2022-09-21 NOTE — Patient Instructions (Signed)
Visit Information  Thank you for taking time to visit with me today. Please don't hesitate to contact me if I can be of assistance to you.   Following are the goals we discussed today:   Goals Addressed             This Visit's Progress    continue to improve post hospitalization       Interventions Today    Flowsheet Row Most Recent Value  Chronic Disease   Chronic disease during today's visit Hypertension (HTN)  General Interventions   General Interventions Discussed/Reviewed General Interventions Discussed, Doctor Visits  [evaluation of currentl treatment plan related to htn and patient's adherence to plan as established by provider.]  Doctor Visits Discussed/Reviewed Doctor Visits Discussed, PCP  PCP/Specialist Visits Compliance with follow-up visit  Education Interventions   Education Provided Provided Education  [advised patient to continue to check blood pressure and notify provider if outside of recommeded parameters]  Provided Verbal Education On Nutrition  [advised [place] pink salt is still sodium. patient on NAS diet]  Nutrition Interventions   Nutrition Discussed/Reviewed --  [reiterated low salt diet, discussed pink salt is still a sodium/salt.]            Our next appointment is by telephone on 10/05/22 at 10:30 am  Please call the care guide team at [phone] if you need to cancel or reschedule your appointment.   If you are experiencing a Mental Health or [place] or need someone to talk to, please call the Suicide and Crisis Lifeline: [place], RN, MSN, BSN, [place] [phone]

## 2022-09-21 NOTE — Patient Outreach (Signed)
  Care Coordination   Initial Visit Note   09/21/2022 Name: Paula Griffith MRN: [id number] DOB: [birth year]  Paula Griffith is a 77 y.o. year old female who sees [name], [name], MD for primary care. I spoke with  Paula Griffith by phone today.  What matters to the patients health and wellness today?  Admitted 09/01/22-09/06/22 with Hypertensive urgency/acute on chronic hyponatremia, hypokalemia. Paula Griffith reports her blood pressure has improved. She states her grandson checks her blood pressure daily. BP today it was 136/97 HR 69. She reports follow up with PCP completed on 09/16/22. She denies any questions or concerns at this time.  Goals Addressed             This Visit's Progress    continue to improve post hospitalization       Interventions Today    Flowsheet Row Most Recent Value  Chronic Disease   Chronic disease during today's visit Hypertension (HTN)  General Interventions   General Interventions Discussed/Reviewed General Interventions Discussed, Doctor Visits  [evaluation of currentl treatment plan related to htn and patient's adherence to plan as established by provider.]  Doctor Visits Discussed/Reviewed Doctor Visits Discussed, PCP  PCP/Specialist Visits Compliance with follow-up visit  Education Interventions   Education Provided Provided Education  [advised patient to continue to check blood pressure and notify provider if outside of recommeded parameters]  Provided Verbal Education On Nutrition  [advised [place] pink salt is still sodium. patient on NAS diet]  Nutrition Interventions   Nutrition Discussed/Reviewed --  [reiterated low salt diet, discussed pink salt is still a sodium/salt.]            SDOH assessments and interventions completed:  No  Care Coordination Interventions:  Yes, provided   Follow up plan: Follow up call scheduled for 10/05/22    Encounter Outcome:  Pt. Visit Completed

## 2022-09-23 ENCOUNTER — Telehealth: Payer: Self-pay

## 2022-09-23 NOTE — Telephone Encounter (Signed)
Contacted Paula Griffith to schedule their annual wellness visit. Appointment made for 10/01/22.  Norton Blizzard, [place] ([facility])  [facility] [phone]

## 2022-09-29 ENCOUNTER — Telehealth: Payer: Self-pay | Admitting: Internal Medicine

## 2022-09-29 NOTE — Telephone Encounter (Signed)
Patient dropped off document Handicap Placard, to be filled out by provider. Patient requested to send it via Call Patient to pick up within 7-days. Document is located in providers tray at front office.Please advise at Mobile [phone] (mobile)

## 2022-09-30 NOTE — Telephone Encounter (Signed)
Notified pt form is ready for pick-up../lmb 

## 2022-10-01 ENCOUNTER — Telehealth: Payer: Self-pay

## 2022-10-01 NOTE — Telephone Encounter (Signed)
Called patient to complete AWV-S via phone, voicemail full not able to leave message.   Varsha Knock N. Taneshia Lorence, LPN. [place] Team Direct Dial: [phone]

## 2022-10-05 ENCOUNTER — Ambulatory Visit: Payer: Self-pay

## 2022-10-05 NOTE — Patient Instructions (Signed)
Visit Information  Thank you for taking time to visit with me today. Please don't hesitate to contact me if I can be of assistance to you.   Following are the goals we discussed today:   Goals Addressed             This Visit's Progress    continue to improve post hospitalization       Interventions Today    Flowsheet Row Most Recent Value  Chronic Disease   Chronic disease during today's visit Hypertension (HTN)  General Interventions   General Interventions Discussed/Reviewed General Interventions Reviewed  Doctor Visits Discussed/Reviewed Doctor Visits Discussed  PCP/Specialist Visits Compliance with follow-up visit  Education Interventions   Education Provided Provided Education  Provided Verbal Education On Other, When to see the doctor  [discussed blood pressures. advised patient to check BP sometimes after medications have been taken to see how taking the medications affect the BP. reinforced monitor salt intake and avoid processed foods.]  Nutrition Interventions   Nutrition Discussed/Reviewed Nutrition Reviewed  Pharmacy Interventions   Pharmacy Dicussed/Reviewed Pharmacy Topics Reviewed            Our next appointment is by telephone on 10/19/22 at 3:30 pm  Please call the care guide team at [phone] if you need to cancel or reschedule your appointment.   If you are experiencing a Mental Health or Behavioral Health Crisis or need someone to talk to, please call the Suicide and Crisis Lifeline: 34  Kathyrn Sheriff, RN, MSN, BSN, CCM Care Management Coordinator [phone]

## 2022-10-05 NOTE — Patient Outreach (Signed)
  Care Coordination   Follow Up Visit Note   10/05/2022 Name: Paula Griffith MRN: [id number] DOB: 03/08/1946  Paula Griffith is a 77 y.o. year old female who sees [name], Georgina Quint, MD for primary care. I spoke with  Paula Griffith by phone today.  What matters to the patients health and wellness today?  [name] reports she is doing well. She reports blood pressure 136/97 this morning. She reports she was rushing this morning. Yesterday BP 174/89-reports was also rushing. Both checked prior to Medications. She continues to work on minimizing stress.  Goals Addressed             This Visit's Progress    continue to improve post hospitalization       Interventions Today    Flowsheet Row Most Recent Value  Chronic Disease   Chronic disease during today's visit Hypertension (HTN)  General Interventions   General Interventions Discussed/Reviewed General Interventions Reviewed  Doctor Visits Discussed/Reviewed Doctor Visits Discussed  PCP/Specialist Visits Compliance with follow-up visit  Education Interventions   Education Provided Provided Education  Provided Verbal Education On Other, When to see the doctor  [discussed blood pressures. advised patient to check BP sometimes after medications have been taken to see how taking the medications affect the BP. reinforced monitor salt intake and avoid processed foods.]  Nutrition Interventions   Nutrition Discussed/Reviewed Nutrition Reviewed  Pharmacy Interventions   Pharmacy Dicussed/Reviewed Pharmacy Topics Reviewed            SDOH assessments and interventions completed:  No  Care Coordination Interventions:  Yes, provided   Follow up plan: Follow up call scheduled for 10/19/22    Encounter Outcome:  Pt. Visit Completed   Kathyrn Sheriff, RN, MSN, BSN, CCM Care  Coordinator [phone]

## 2022-10-10 ENCOUNTER — Other Ambulatory Visit: Payer: Self-pay | Admitting: Internal Medicine

## 2022-10-19 ENCOUNTER — Ambulatory Visit: Payer: Self-pay

## 2022-10-19 NOTE — Patient Outreach (Signed)
  Care Coordination   Follow Up Visit Note   10/19/2022 Name: Paula Griffith MRN: [id number] DOB: 01-27-1946  Paula Griffith is a 77 y.o. year old female who sees Griffith, Paula Quint, MD for primary care. I spoke with  Paula Griffith by phone today.  What matters to the patients health and wellness today?  Admitted 09/01/22-09/06/22 with Hypertensive urgency. Paula Griffith reports she has been busy with church. She reports her son continues to routinely check her BP. BP last checked 10/09/22 136/89 HR 78. She denies any questions or concerns at this time.  Goals Addressed             This Visit's Progress    continue to improve post hospitalization       Interventions Today    Flowsheet Row Most Recent Value  Chronic Disease   Chronic disease during today's visit Hypertension (HTN)  General Interventions   General Interventions Discussed/Reviewed General Interventions Reviewed, Doctor Visits  Doctor Visits Discussed/Reviewed Doctor Visits Discussed  PCP/Specialist Visits Compliance with follow-up visit  [upcoming appointments reviewed]  Education Interventions   Education Provided Provided Education  Provided Verbal Education On --  [reiterated importance of continuing to monitor BP, and low salt diet, encouraged to continue to attend appointments as scheduled.]  Pharmacy Interventions   Pharmacy Dicussed/Reviewed Pharmacy Topics Reviewed            SDOH assessments and interventions completed:  Yes  Care Coordination Interventions:  Yes, provided   Follow up plan: Follow up call scheduled for 11/18/22    Encounter Outcome:  Pt. Visit Completed   Paula Sheriff, RN, MSN, BSN, CCM [facility] Care Coordinator [phone]

## 2022-10-19 NOTE — Patient Instructions (Signed)
Visit Information  Thank you for taking time to visit with me today. Please don't hesitate to contact me if I can be of assistance to you.   Following are the goals we discussed today:   Goals Addressed             This Visit's Progress    continue to improve post hospitalization       Interventions Today    Flowsheet Row Most Recent Value  Chronic Disease   Chronic disease during today's visit Hypertension (HTN)  General Interventions   General Interventions Discussed/Reviewed General Interventions Reviewed, Doctor Visits  Doctor Visits Discussed/Reviewed Doctor Visits Discussed  PCP/Specialist Visits Compliance with follow-up visit  [upcoming appointments reviewed]  Education Interventions   Education Provided Provided Education  Provided Verbal Education On --  [reiterated importance of continuing to monitor BP, and low salt diet, encouraged to continue to attend appointments as scheduled.]  Pharmacy Interventions   Pharmacy Dicussed/Reviewed Pharmacy Topics Reviewed            Our next appointment is by telephone on 11/18/22 at 11:30 am  Please call the care guide team at [phone] if you need to cancel or reschedule your appointment.   If you are experiencing a Mental Health or Behavioral Health Crisis or need someone to talk to, please call the Suicide and Crisis Lifeline: 68  Kathyrn Sheriff, RN, MSN, BSN, CCM [facility] Care Coordinator [phone]

## 2022-10-22 ENCOUNTER — Inpatient Hospital Stay: Payer: Medicare PPO | Admitting: Hematology

## 2022-10-22 ENCOUNTER — Inpatient Hospital Stay: Payer: Medicare PPO | Attending: Hematology

## 2022-10-22 ENCOUNTER — Encounter: Payer: Self-pay | Admitting: Hematology

## 2022-10-22 ENCOUNTER — Other Ambulatory Visit: Payer: Self-pay

## 2022-10-22 VITALS — BP 167/77 | HR 71 | Temp 98.7°F | Resp 18 | Ht 63.0 in | Wt 134.4 lb

## 2022-10-22 DIAGNOSIS — D471 Chronic myeloproliferative disease: Secondary | ICD-10-CM | POA: Diagnosis not present

## 2022-10-22 DIAGNOSIS — D75839 Thrombocytosis, unspecified: Secondary | ICD-10-CM | POA: Insufficient documentation

## 2022-10-22 DIAGNOSIS — I10 Essential (primary) hypertension: Secondary | ICD-10-CM | POA: Insufficient documentation

## 2022-10-22 DIAGNOSIS — Z79899 Other long term (current) drug therapy: Secondary | ICD-10-CM | POA: Insufficient documentation

## 2022-10-22 DIAGNOSIS — D649 Anemia, unspecified: Secondary | ICD-10-CM | POA: Insufficient documentation

## 2022-10-22 LAB — CBC WITH DIFFERENTIAL/PLATELET
Abs Immature Granulocytes: 0.01 10*3/uL (ref 0.00–0.07)
Basophils Absolute: 0.1 10*3/uL (ref 0.0–0.1)
Basophils Relative: 1 %
Eosinophils Absolute: 0.1 10*3/uL (ref 0.0–0.5)
Eosinophils Relative: 2 %
HCT: 25.5 % — ABNORMAL LOW (ref 36.0–46.0)
Hemoglobin: 9 g/dL — ABNORMAL LOW (ref 12.0–15.0)
Immature Granulocytes: 0 %
Lymphocytes Relative: 23 %
Lymphs Abs: 1.3 10*3/uL (ref 0.7–4.0)
MCH: 32.3 pg (ref 26.0–34.0)
MCHC: 35.3 g/dL (ref 30.0–36.0)
MCV: 91.4 fL (ref 80.0–100.0)
Monocytes Absolute: 0.6 10*3/uL (ref 0.1–1.0)
Monocytes Relative: 11 %
Neutro Abs: 3.5 10*3/uL (ref 1.7–7.7)
Neutrophils Relative %: 63 %
Platelets: 265 10*3/uL (ref 150–400)
RBC: 2.79 MIL/uL — ABNORMAL LOW (ref 3.87–5.11)
RDW: 11.9 % (ref 11.5–15.5)
WBC: 5.6 10*3/uL (ref 4.0–10.5)
nRBC: 0 % (ref 0.0–0.2)

## 2022-10-22 LAB — COMPREHENSIVE METABOLIC PANEL
ALT: 5 U/L (ref 0–44)
AST: 19 U/L (ref 15–41)
Albumin: 3.8 g/dL (ref 3.5–5.0)
Alkaline Phosphatase: 45 U/L (ref 38–126)
Anion gap: 8 (ref 5–15)
BUN: 10 mg/dL (ref 8–23)
CO2: 30 mmol/L (ref 22–32)
Calcium: 9.3 mg/dL (ref 8.9–10.3)
Chloride: 93 mmol/L — ABNORMAL LOW (ref 98–111)
Creatinine, Ser: 0.79 mg/dL (ref 0.44–1.00)
GFR, Estimated: >60 mL/min (ref >60)
Glucose, Bld: 122 mg/dL — ABNORMAL HIGH (ref 70–99)
Potassium: 3.4 mmol/L — ABNORMAL LOW (ref 3.5–5.1)
Sodium: 131 mmol/L — ABNORMAL LOW (ref 135–145)
Total Bilirubin: 0.3 mg/dL (ref 0.3–1.2)
Total Protein: 6.7 g/dL (ref 6.5–8.1)

## 2022-10-22 LAB — IRON AND IRON BINDING CAPACITY (CC-WL,HP ONLY)
Iron: 64 ug/dL (ref 28–170)
Saturation Ratios: 22 % (ref 10.4–31.8)
TIBC: 298 ug/dL (ref 250–450)
UIBC: 234 ug/dL (ref 148–442)

## 2022-10-22 NOTE — Assessment & Plan Note (Signed)
essential thrombocythemia, JAK2 mutation negative. -We previously reviewed the natural history of ET, most patients do well, major complication is thrombosis. -She was previously on Hydrea, switched to anagrelide in 05/2015 due to anemia from Hydrea. -Her anagrelide dose has been titrated as needed. We will monitor her EKG periodically. She is currently on Anagrelide  daily except Tuesdays/Thursdays.  -Labs overall stable. Plan to continue Anagrelide  once daily 5 days a week -lab and F/u in 4 months

## 2022-10-22 NOTE — Progress Notes (Signed)
[facility]   [phone] Fax:([phone]   Clinic Follow up Note   Patient Care Team: [name], Georgina Quint, MD as PCP - General Chuck Hint, MD (Vascular Surgery) Nahser, Deloris Ping, MD as Consulting Physician (Cardiology) Malachy Mood, MD as Consulting Physician (Hematology) Tia Alert, MD as Consulting Physician (Neurosurgery) [facility] as Consulting Physician (Ophthalmology) Colletta Maryland, RN as [facility] Care Management  Date of Service:  10/22/2022  CHIEF COMPLAINT: f/u of MPN   CURRENT THERAPY:   Anagrelide, starting 06/10/15, currently 1 mg daily except Tuesday and Thursday  ASSESSMENT:  Paula Griffith is a 77 y.o. female with   MPN (myeloproliferative neoplasm) (HCC) essential thrombocythemia, JAK2 mutation negative. -We previously reviewed the natural history of ET, most patients do well, major complication is thrombosis. -She was previously on Hydrea, switched to anagrelide in 05/2015 due to anemia from Hydrea. -Her anagrelide dose has been titrated as needed. We will monitor her EKG periodically. She is currently on Anagrelide 1mg  daily except Tuesdays/Thursdays.  -Labs overall stable. Plan to continue Anagrelide 1mg  once daily 5 days a week -lab and F/u in 4 months   2. Anemia -secondary to MPN and previous hydrea  -hgb improved to 9.6 today (04/23/22), iron has been WNL   3. Hypertension and other medical problems -continue follow-up with PCP    PLAN: - lab reviewed hg 9.0, plt normal  -Continue Anagrelide 1 mg daily except Tuesday and Thursday - lab and f/u in 6 months, lab in 3 months    INTERVAL HISTORY:  Paula Griffith is here for a follow up of   MPN .She was last seen by me on 12/22/2021. She presents to the clinic alone. Pt stated  that she was in the hospital for hypertension . Pt is very active in her church.    All other systems were reviewed with the patient and  are negative.  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   Spondylolisthesis of lumbar region    Thrombocytopathy [facility]) 2011   Dr Arline Asp    SURGICAL HISTORY: Past Surgical History:  Procedure Laterality Date   ABDOMINAL HYSTERECTOMY  1989   ANTERIOR CERVICAL DECOMP/DISCECTOMY FUSION N/A 09/22/2017   Procedure: ANTERIOR CERVICAL DECOMPRESSION AND FUSION CERVICAL THREE-FOUR.;  Surgeon: Tia Alert, MD;  Location: [facility] OR;  Service: Neurosurgery;  Laterality: N/A;  anterior   BACK SURGERY     BREAST EXCISIONAL BIOPSY Left    BREAST SURGERY     Left   LUMBAR LAMINECTOMY/DECOMPRESSION MICRODISCECTOMY Left 01/06/2018   Procedure: laminectomy Lumbar five -Sacral one - left, Lumbar two-Lumbar three - bilateral;  Surgeon: Tia Alert, MD;  Location: [facility] OR;  Service: Neurosurgery;  Laterality: Left;    I have reviewed the social history and family history with the patient and they are unchanged from previous note.  ALLERGIES:  is allergic to thiazide-type diuretics, benazepril, amlodipine, fluad [influenza vac a&b  surf ant adj], other, codeine, and tramadol.  MEDICATIONS:  Current Outpatient Medications  Medication Sig Dispense Refill   acetaminophen (TYLENOL) 325 MG tablet Take 2 tablets (650 mg total) by mouth every 6 (six) hours as needed for mild pain (or Fever >/= 101). 20 tablet 0   albuterol (PROAIR HFA) 108 (90 Base) MCG/ACT inhaler Inhale 2 puffs into the lungs every 4 (four) hours as needed. For shortness of breath 18 g 5   amLODipine (NORVASC) 2.5 MG  tablet Take 1 tablet (2.5 mg total) by mouth daily. 30 tablet 0   anagrelide (AGRYLIN) 1 MG capsule Take 1 capsule (1 mg total) by mouth See admin instructions. TAKE 1 CAPSULE BY MOUTH DAILY EXCEPT 2 DAYS A WEEK (TUESDAYS AND THURSDAYS) DO NOT TAKE. 120 capsule 1   Ascorbic Acid (VITAMIN C) 1000 MG tablet Take 1,000 mg by mouth daily.     aspirin 325 MG EC tablet Take 325 mg by mouth daily after breakfast.     carvedilol (COREG) 25 MG tablet TAKE 1 TABLET BY MOUTH TWICE A DAY 180 tablet 3   Cholecalciferol (VITAMIN D3) 2000 units TABS Take 2,000 Units by mouth daily.     Fluticasone-Salmeterol (ADVAIR) 250-50 MCG/DOSE AEPB Please specify directions, refills and quantity 1 each 11   gabapentin (NEURONTIN) 100 MG capsule TAKE 1 CAPSULE BY MOUTH THREE TIMES A DAY (Patient taking differently: Take 100 mg by mouth 3 (three) times daily.) 270 capsule 1   hydrALAZINE (APRESOLINE) 25 MG tablet Take 3 tablets (75 mg total) by mouth every 8 (eight) hours. 270 tablet 0   losartan (COZAAR) 100 MG tablet Take 1 tablet (100 mg total) by mouth daily. 30 tablet 0   Multiple Vitamins-Minerals (ADULT GUMMY PO) Take 2 tablets by mouth daily.     ondansetron (ZOFRAN) 4 MG tablet Take 1 tablet (4 mg total) by mouth every 6 (six) hours as needed for nausea. 20 tablet 0   silver sulfADIAZINE (SILVADENE) 1 % cream Apply topically daily. 50 g 0   trolamine salicylate (ASPERCREME) 10 % cream Apply 1 application topically 2 (two) times daily as needed for muscle pain.     No current facility-administered medications for this visit.    PHYSICAL EXAMINATION: ECOG PERFORMANCE STATUS: 0 - Asymptomatic  Vitals:   10/22/22 1509  BP: (!) 167/77  Pulse: 71  Resp: 18  Temp: 98.7 F (37.1 C)  SpO2: 100%   Wt Readings from Last 3 Encounters:  10/22/22 134 lb 6.4 oz (61 kg)  09/16/22 131 lb (59.4 kg)  09/06/22 133 lb 13.1 oz (60.7 kg)     GENERAL:alert, no distress and comfortable SKIN: skin color normal, no rashes or  significant lesions EYES: normal, Conjunctiva are pink and non-injected, sclera clear  NEURO: alert & oriented x 3 with fluent speech  LABORATORY DATA:  I have reviewed the data as listed    Latest Ref Rng & Units 10/22/2022    2:47 PM 09/16/2022    3:43 PM 09/06/2022    8:21 AM  CBC  WBC 4.0 - 10.5 K/uL 5.6  5.5  5.8   Hemoglobin 12.0 - 15.0 g/dL 9.0  8.8 Repeated and verified X2.  8.6   Hematocrit 36.0 - 46.0 % 25.5  25.4  26.3   Platelets 150 - 400 K/uL 265  439.0  288         Latest Ref Rng & Units 10/22/2022    2:47 PM 09/16/2022    3:43 PM 09/06/2022  8:21 AM  CMP  Glucose 70 - 99 mg/dL 604  540  981   BUN 8 - 23 mg/dL Creatinine 0.44 - 1.00 mg/dL 1.91  4.78  2.95   Sodium 135 - 145 mmol/L 131  130  128   Potassium 3.5 - 5.1 mmol/L 3.4  3.6  4.2   Chloride 98 - 111 mmol/L 93  97  98   CO2 22 - 32 mmol/L Calcium 8.9 - 10.3 mg/dL 9.3  9.3  8.9   Total Protein 6.5 - 8.1 g/dL 6.7  6.6    Total Bilirubin 0.3 - 1.2 mg/dL 0.3  0.4    Alkaline Phos 38 - 126 U/L 45  42    AST 15 - 41 U/L 19  16    ALT 0 - 44 U/L 5  5        RADIOGRAPHIC STUDIES: I have personally reviewed the radiological images as listed and agreed with the findings in the report. No results found.    No orders of the defined types were placed in this encounter.  All questions were answered. The patient knows to call the clinic with any problems, questions or concerns. No barriers to learning was detected. The total time spent in the appointment was 15 minutes.     Malachy Mood, MD 10/22/2022   Carolin Coy, CMA, am acting as scribe for Malachy Mood, MD.   I have reviewed the above documentation for accuracy and completeness, and I agree with the above.

## 2022-10-28 ENCOUNTER — Encounter: Payer: Self-pay | Admitting: Internal Medicine

## 2022-11-18 ENCOUNTER — Ambulatory Visit: Payer: Self-pay

## 2022-11-18 NOTE — Patient Instructions (Signed)
Visit Information  Thank you for taking time to visit with me today. Please don't hesitate to contact me if I can be of assistance to you.   Following are the goals we discussed today:   Goals Addressed             This Visit's Progress    continue to improve post hospitalization       Interventions Today    Flowsheet Row Most Recent Value  Chronic Disease   Chronic disease during today's visit Hypertension (HTN)  General Interventions   General Interventions Discussed/Reviewed General Interventions Reviewed, Doctor Visits  Doctor Visits Discussed/Reviewed Doctor Visits Discussed, PCP  PCP/Specialist Visits Compliance with follow-up visit  [reiterated upcoming PCP office visit scheduled for 12/17/22]  Education Interventions   Provided Verbal Education On Other  [encouraged self care during this time,  encouraged to continue to eat healthy, take medications as prescribed]  Mental Health Interventions   Mental Health Discussed/Reviewed Grief and Loss  [grief/loss: reiterated LCSW available. Patient declines at this time. encouraged to contact RNCM if needed.]            Our next appointment is by telephone on 12/22/22 at 10:00 am  Please call the care guide team at [phone] if you need to cancel or reschedule your appointment.   If you are experiencing a Mental Health or Behavioral Health Crisis or need someone to talk to, please call the Suicide and Crisis Lifeline: 32  Kathyrn Sheriff, RN, MSN, BSN, CCM [facility] Care Coordinator [phone]

## 2022-11-18 NOTE — Patient Outreach (Signed)
  Care Coordination   Follow Up Visit Note   11/18/2022 Name: WHISPER PANASUK MRN: [id number] DOB: [birth year]  SHARIN CHIRILLO is a 77 y.o. year old female who sees [name], Georgina Quint, MD for primary care. I spoke with  [name] by phone today.  What matters to the patients health and wellness today?  Ms. Bilbro reports this is not a good time to talk. Ms. Adger reports that she just lost her niece unexpectedly on yesterday and she is helping her sister make arrangements. She reports she has a supportive family who is their assisting as needed.  Goals Addressed             This Visit's Progress    continue to improve post hospitalization       Interventions Today    Flowsheet Row Most Recent Value  Chronic Disease   Chronic disease during today's visit Hypertension (HTN)  General Interventions   General Interventions Discussed/Reviewed General Interventions Reviewed, Doctor Visits  Doctor Visits Discussed/Reviewed Doctor Visits Discussed, PCP  PCP/Specialist Visits Compliance with follow-up visit  [reiterated upcoming PCP office visit scheduled for 12/17/22]  Education Interventions   Provided Verbal Education On Other  [encouraged self care during this time,  encouraged to continue to eat healthy, take medications as prescribed]  Mental Health Interventions   Mental Health Discussed/Reviewed Grief and Loss  [grief/loss: reiterated LCSW available. Patient declines at this time. encouraged to contact RNCM if needed.]            SDOH assessments and interventions completed:  No  Care Coordination Interventions:  Yes, provided   Follow up plan: Follow up call scheduled for 6/225/24    Encounter Outcome:  Pt. Visit Completed   Kathyrn Sheriff, RN, MSN, BSN, CCM [facility] Care Coordinator [phone]

## 2022-12-15 ENCOUNTER — Telehealth: Payer: Self-pay | Admitting: Internal Medicine

## 2022-12-15 NOTE — Telephone Encounter (Signed)
Noted. Thanks.

## 2022-12-15 NOTE — Telephone Encounter (Signed)
Patient's daughter called and said her mother is having memory issues. They would like for the patient to be checked for it at her appointment on 12/17/2022.

## 2022-12-17 ENCOUNTER — Encounter: Payer: Self-pay | Admitting: Internal Medicine

## 2022-12-17 ENCOUNTER — Ambulatory Visit: Payer: Medicare PPO | Admitting: Internal Medicine

## 2022-12-17 VITALS — BP 160/80 | HR 66 | Temp 98.5°F | Ht 63.0 in | Wt 128.0 lb

## 2022-12-17 DIAGNOSIS — I10 Essential (primary) hypertension: Secondary | ICD-10-CM

## 2022-12-17 DIAGNOSIS — R269 Unspecified abnormalities of gait and mobility: Secondary | ICD-10-CM

## 2022-12-17 DIAGNOSIS — I160 Hypertensive urgency: Secondary | ICD-10-CM

## 2022-12-17 DIAGNOSIS — D471 Chronic myeloproliferative disease: Secondary | ICD-10-CM | POA: Diagnosis not present

## 2022-12-17 DIAGNOSIS — F439 Reaction to severe stress, unspecified: Secondary | ICD-10-CM | POA: Diagnosis not present

## 2022-12-17 DIAGNOSIS — W19XXXD Unspecified fall, subsequent encounter: Secondary | ICD-10-CM

## 2022-12-17 DIAGNOSIS — Z0000 Encounter for general adult medical examination without abnormal findings: Secondary | ICD-10-CM

## 2022-12-17 MED ORDER — ANAGRELIDE HCL 1 MG PO CAPS: 1.0000 mg | ORAL_CAPSULE | ORAL | 1 refills | Status: DC

## 2022-12-17 NOTE — Assessment & Plan Note (Signed)
Stressed out today Re-checked BP -  better Cont on Diovan HCT 320/25, Clonidine twice daily prn. Continue Coreg 25 mg bid, Norvasc, Catapress patch

## 2022-12-17 NOTE — Progress Notes (Signed)
Subjective:  Patient ID: Paula Griffith, female    DOB: Jul 06, 1945  Age: 77 y.o. MRN: [id number]  CC: No chief complaint on file.   HPI Paula Griffith presents for HTN with high blood pressure readings.   Dtr [name] is worried about memory  Dtr [name] would be her POA for healthcare per pt  Outpatient Medications Prior to Visit  Medication Sig Dispense Refill   acetaminophen (TYLENOL) 325 MG tablet Take 2 tablets (650 mg total) by mouth every 6 (six) hours as needed for mild pain (or Fever >/= 101). 20 tablet 0   albuterol (PROAIR HFA) 108 (90 Base) MCG/ACT inhaler Inhale 2 puffs into the lungs every 4 (four) hours as needed. For shortness of breath 18 g 5   Ascorbic Acid (VITAMIN C) 1000 MG tablet Take 1,000 mg by mouth daily.     aspirin 325 MG EC tablet Take 325 mg by mouth daily after breakfast.     carvedilol (COREG) 25 MG tablet TAKE 1 TABLET BY MOUTH TWICE A DAY 180 tablet 3   Cholecalciferol (VITAMIN D3) 2000 units TABS Take 2,000 Units by mouth daily.     Fluticasone-Salmeterol (ADVAIR) 250-50 MCG/DOSE AEPB Please specify directions, refills and quantity 1 each 11   gabapentin (NEURONTIN) 100 MG capsule TAKE 1 CAPSULE BY MOUTH THREE TIMES A DAY (Patient taking differently: Take 100 mg by mouth 3 (three) times daily.) 270 capsule 1   hydrALAZINE (APRESOLINE) 25 MG tablet Take 3 tablets (75 mg total) by mouth every 8 (eight) hours. 270 tablet 0   Multiple Vitamins-Minerals (ADULT GUMMY PO) Take 2 tablets by mouth daily.     trolamine salicylate (ASPERCREME) 10 % cream Apply 1 application topically 2 (two) times daily as needed for muscle pain.     anagrelide (AGRYLIN) 1 MG capsule Take 1 capsule (1 mg total) by mouth See admin instructions. TAKE 1 CAPSULE BY MOUTH DAILY EXCEPT 2 DAYS A WEEK (TUESDAYS AND THURSDAYS) DO NOT TAKE. 120 capsule 1   amLODipine (NORVASC) 2.5 MG tablet Take 1 tablet (2.5 mg total) by mouth daily. (Patient not taking: Reported on 12/17/2022) 30  tablet 0   losartan (COZAAR) 100 MG tablet Take 1 tablet (100 mg total) by mouth daily. (Patient not taking: Reported on 12/17/2022) 30 tablet 0   ondansetron (ZOFRAN) 4 MG tablet Take 1 tablet (4 mg total) by mouth every 6 (six) hours as needed for nausea. (Patient not taking: Reported on 12/17/2022) 20 tablet 0   silver sulfADIAZINE (SILVADENE) 1 % cream Apply topically daily. (Patient not taking: Reported on 12/17/2022) 50 g 0   No facility-administered medications prior to visit.    ROS: Review of Systems  Constitutional:  Negative for activity change, appetite change, chills, fatigue and unexpected weight change.  HENT:  Negative for congestion, mouth sores and sinus pressure.   Eyes:  Negative for visual disturbance.  Respiratory:  Negative for cough and chest tightness.   Gastrointestinal:  Negative for abdominal pain and nausea.  Genitourinary:  Negative for difficulty urinating, frequency and vaginal pain.  Musculoskeletal:  Positive for gait problem. Negative for back pain.  Skin:  Negative for pallor and rash.  Neurological:  Negative for dizziness, tremors, weakness, numbness and headaches.  Psychiatric/Behavioral:  Negative for behavioral problems, confusion, decreased concentration, dysphoric mood, sleep disturbance and suicidal ideas. The patient is nervous/anxious.     Objective:  BP (!) 160/80   Pulse 66   Temp 98.5 F (36.9 C) (Oral)  Ht 5\' 3"  (1.6 m)   Wt 128 lb (58.1 kg)   SpO2 97%   BMI 22.67 kg/m   BP Readings from Last 3 Encounters:  12/17/22 (!) 160/80  10/22/22 (!) 167/77  09/16/22 (!) 140/76    Wt Readings from Last 3 Encounters:  12/17/22 128 lb (58.1 kg)  10/22/22 134 lb 6.4 oz (61 kg)  09/16/22 131 lb (59.4 kg)    Physical Exam Constitutional:      General: She is not in acute distress.    Appearance: She is well-developed. She is obese.  HENT:     Head: Normocephalic.     Right Ear: External ear normal.     Left Ear: External ear normal.      Nose: Nose normal.  Eyes:     General:        Right eye: No discharge.        Left eye: No discharge.     Conjunctiva/sclera: Conjunctivae normal.     Pupils: Pupils are equal, round, and reactive to light.  Neck:     Thyroid: No thyromegaly.     Vascular: No JVD.     Trachea: No tracheal deviation.  Cardiovascular:     Rate and Rhythm: Normal rate and regular rhythm.     Heart sounds: Normal heart sounds.  Pulmonary:     Effort: No respiratory distress.     Breath sounds: No stridor. No wheezing.  Abdominal:     General: Bowel sounds are normal. There is no distension.     Palpations: Abdomen is soft. There is no mass.     Tenderness: There is no abdominal tenderness. There is no guarding or rebound.  Musculoskeletal:        General: No tenderness.     Cervical back: Normal range of motion and neck supple. No rigidity.     Right lower leg: No edema.     Left lower leg: No edema.  Lymphadenopathy:     Cervical: No cervical adenopathy.  Skin:    Findings: No erythema, lesion or rash.  Neurological:     Mental Status: She is oriented to person, place, and time.     Cranial Nerves: No cranial nerve deficit.     Motor: No abnormal muscle tone.     Coordination: Coordination abnormal.     Gait: Gait abnormal.     Deep Tendon Reflexes: Reflexes normal.  Psychiatric:        Mood and Affect: Mood normal.        Behavior: Behavior normal.        Thought Content: Thought content normal.        Judgment: Judgment normal.   Using a cane  Lab Results  Component Value Date   WBC 5.6 10/22/2022   HGB 9.0 (L) 10/22/2022   HCT 25.5 (L) 10/22/2022   PLT 265 10/22/2022   GLUCOSE 122 (H) 10/22/2022   CHOL 158 03/01/2018   TRIG 84.0 03/01/2018   HDL 47.10 03/01/2018   LDLCALC 94 03/01/2018   ALT 5 10/22/2022   AST 19 10/22/2022   NA 131 (L) 10/22/2022   K 3.4 (L) 10/22/2022   CL 93 (L) 10/22/2022   CREATININE 0.79 10/22/2022   BUN 10 10/22/2022   CO2 30 10/22/2022    TSH 1.975 09/03/2022   INR 1.0 09/01/2022   HGBA1C 5.4 04/28/2021    VAS US CAROTID  Result Date: 09/02/2022 Carotid Arterial Duplex Study Patient Name:  ARMANDA FORAND  Date of Exam:   09/02/2022 Medical Rec #: [id number]       Accession #:    [id number] Date of Birth: [birth year]       Patient Gender: F Patient Age:   31 years Exam [facility]  [facility] Procedure:      VAS US CAROTID Referring Phys: Lyda Perone --------------------------------------------------------------------------------  Indications:       Carotid stenosis-bilateral I65.23. Risk Factors:      Hypertension. Comparison Study:  No prior studies. Performing Technologist: Chanda Busing RVT  Examination Guidelines: A complete evaluation includes B-mode imaging, spectral Doppler, color Doppler, and power Doppler as needed of all accessible portions of each vessel. Bilateral testing is considered an integral part of a complete examination. Limited examinations for reoccurring indications may be performed as noted.  Right Carotid Findings: +----------+--------+--------+--------+-----------------------+--------+           PSV cm/sEDV cm/sStenosisPlaque Description     Comments +----------+--------+--------+--------+-----------------------+--------+ CCA Prox  150     29              smooth and heterogenous         +----------+--------+--------+--------+-----------------------+--------+ CCA Distal103     22              smooth and heterogenous         +----------+--------+--------+--------+-----------------------+--------+ ICA Prox  93      23              smooth and heterogenous         +----------+--------+--------+--------+-----------------------+--------+ ICA Mid   120     36                                              +----------+--------+--------+--------+-----------------------+--------+ ICA Distal117     35                                     tortuous  +----------+--------+--------+--------+-----------------------+--------+ ECA       128     13                                              +----------+--------+--------+--------+-----------------------+--------+ +----------+--------+-------+--------+-------------------+           PSV cm/sEDV cmsDescribeArm Pressure (mmHG) +----------+--------+-------+--------+-------------------+ Subclavian215                                        +----------+--------+-------+--------+-------------------+ +---------+--------+---+--------+--+---------+ VertebralPSV cm/s107EDV cm/s34Antegrade +---------+--------+---+--------+--+---------+  Left Carotid Findings: +----------+--------+--------+--------+-----------------------+--------+           PSV cm/sEDV cm/sStenosisPlaque Description     Comments +----------+--------+--------+--------+-----------------------+--------+ CCA Prox  133     29                                              +----------+--------+--------+--------+-----------------------+--------+ CCA Distal115     23              smooth and heterogenous         +----------+--------+--------+--------+-----------------------+--------+ ICA  Prox  80      24              smooth and heterogenous         +----------+--------+--------+--------+-----------------------+--------+ ICA Mid   119     39                                              +----------+--------+--------+--------+-----------------------+--------+ ICA Distal111     36                                     tortuous +----------+--------+--------+--------+-----------------------+--------+ ECA       122     21                                              +----------+--------+--------+--------+-----------------------+--------+ +----------+--------+--------+--------+-------------------+           PSV cm/sEDV cm/sDescribeArm Pressure (mmHG)  +----------+--------+--------+--------+-------------------+ Subclavian209                                         +----------+--------+--------+--------+-------------------+ +---------+--------+---+--------+--+---------+ VertebralPSV cm/s133EDV cm/s30Antegrade +---------+--------+---+--------+--+---------+   Summary: Right Carotid: Velocities in the right ICA are consistent with a 1-39% stenosis. Left Carotid: Velocities in the left ICA are consistent with a 1-39% stenosis. Vertebrals: Bilateral vertebral arteries demonstrate antegrade flow. *See table(s) above for measurements and observations.  Electronically signed by Heath Lark on 09/02/2022 at 12:36:42 PM.    Final    MR BRAIN WO CONTRAST  Result Date: 09/01/2022 CLINICAL DATA:  Neuro deficit with acute stroke suspected. Left hand numbness. Facial droop EXAM: MRI HEAD WITHOUT CONTRAST TECHNIQUE: Multiplanar, multiecho pulse sequences of the brain and surrounding structures were obtained without intravenous contrast. COMPARISON:  04/26/2022 head CT FINDINGS: Brain: No acute infarction, hemorrhage, hydrocephalus, extra-axial collection or mass lesion. Patchy FLAIR hyperintensity in the cerebral white matter attributed to chronic small vessel ischemia. There is cortical to juxtacortical remote insult along the right frontal parietal convexity. Brain volume is normal for age. Vascular: Normal flow voids. Skull and upper cervical spine: Cervical fusion at C3-4. No evidence of bone lesion. Sinuses/Orbits: Bilateral cataract resection. Secretions seen in the sphenoid sinuses. IMPRESSION: 1. No acute finding including infarct. 2. Moderate chronic small vessel ischemia in the cerebral white matter. Electronically Signed   By: Tiburcio Pea M.D.   On: 09/01/2022 20:29    Assessment & Plan:   Problem List Items Addressed This Visit     Essential hypertension    Cont on Diovan HCT 320/25, Clonidine twice daily prn. Continue Coreg 25 mg bid,  Norvasc, Catapress patch       MPN (myeloproliferative neoplasm) (HCC) (Chronic)   Relevant Medications   anagrelide (AGRYLIN) 1 MG capsule   Well adult exam   Stress at home - Primary    I do not appreciate any signs of memory loss. Dtr [name] would be her POA for healthcare per pt      Gait disorder    Using a cane      Fall    No relapse, continue to use a cane  Hypertensive urgency    Stressed out today Re-checked BP -  better Cont on Diovan HCT 320/25, Clonidine twice daily prn. Continue Coreg 25 mg bid, Norvasc, Catapress patch          Meds ordered this encounter  Medications   anagrelide (AGRYLIN) 1 MG capsule    Sig: Take 1 capsule (1 mg total) by mouth See admin instructions. TAKE 1 CAPSULE BY MOUTH DAILY EXCEPT 2 DAYS A WEEK (TUESDAYS AND THURSDAYS) DO NOT TAKE.    Dispense:  120 capsule    Refill:  1      Follow-up: No follow-ups on file.  Sonda Primes, MD

## 2022-12-17 NOTE — Assessment & Plan Note (Signed)
Cont on Diovan HCT 320/25, Clonidine twice daily prn. Continue Coreg 25 mg bid, Norvasc, Catapress patch

## 2022-12-17 NOTE — Assessment & Plan Note (Addendum)
I do not appreciate any signs of memory loss. Dtr Paula Griffith would be her POA for healthcare per pt

## 2022-12-20 NOTE — Assessment & Plan Note (Signed)
Using a cane 

## 2022-12-20 NOTE — Assessment & Plan Note (Signed)
No relapse, continue to use a cane

## 2022-12-22 ENCOUNTER — Ambulatory Visit: Payer: Self-pay

## 2022-12-22 ENCOUNTER — Telehealth: Payer: Self-pay

## 2022-12-22 DIAGNOSIS — I10 Essential (primary) hypertension: Secondary | ICD-10-CM

## 2022-12-22 NOTE — Progress Notes (Signed)
   Care Guide Note  12/22/2022 Name: AURI JAHNKE MRN: [id number] DOB: 1946-04-12  Referred by: Tresa Garter, MD Reason for referral : Care Coordination (Outreach to schedule referral with Pharm d )   RONNEISHA JETT is a 77 y.o. year old female who is a primary care patient of [name], Georgina Quint, MD. [name] was referred to the pharmacist for assistance related to HTN.    An unsuccessful telephone outreach was attempted today to contact the patient who was referred to the pharmacy team for assistance with medication management. Additional attempts will be made to contact the patient.   [name], RMA Care Guide [facility]  [place], [place] [address] Direct Dial: [phone] [email or web address]

## 2022-12-22 NOTE — Patient Outreach (Signed)
  Care Coordination   Follow Up Visit Note   12/22/2022 Name: Paula Griffith MRN: [id number] DOB: [birth year]  Paula Griffith is a 77 y.o. year old female who sees [name], Paula Quint, MD for primary care. I spoke with  Paula Griffith by phone today.  What matters to the patients health and wellness today?  Medications reviewed. [name] reports she did not get latest AVS from 12/17/22 from PCP office visit, but received an AVS from 09/16/22. Medication discrepancies noted. Patient reports she does not have losartan or amlodipine. She reports she does not have Clonidine or Catapress patch as noted in office note. She reports she is checking blood pressure, but is not recording results. And reports daughter is checking her blood pressure also. Patient is not able to provide her blood pressure readings but reports they have been ok. [name] states she is on her way out and will stop by the office to get a copy of her after visit summary from 12/17/22.  Goals Addressed             This Visit's Progress    continue to improve post hospitalization       Interventions Today    Flowsheet Row Most Recent Value  Chronic Disease   Chronic disease during today's visit Hypertension (HTN)  General Interventions   General Interventions Discussed/Reviewed General Interventions Reviewed, Doctor Visits, Communication with  Doctor Visits Discussed/Reviewed Doctor Visits Discussed, Doctor Visits Reviewed, PCP  PCP/Specialist Visits Compliance with follow-up visit  Communication with Pharmacists  Education Interventions   Education Provided Provided Education  Provided Verbal Education On Medication, Other  [advised patient to obtain a copy of her after visit summary from PCP office visit 12/17/22. rienforced can access in my chart and obtain from provider office]  Pharmacy Interventions   Pharmacy Dicussed/Reviewed Referral to Pharmacist, Pharmacy Topics Reviewed, Pharmacy Topics Discussed, Medication  Adherence  [medication discrepancy between patient, medication list and PCP note]  Medication Adherence Not taking medication  [patient reports she is not taking amlodipine or losartan]            SDOH assessments and interventions completed:  No  Care Coordination Interventions:  Yes, provided   Follow up plan: Follow up call scheduled for 01/07/23    Encounter Outcome:  Pt. Visit Completed   Kathyrn Sheriff, RN, MSN, BSN, CCM [facility] Care Coordinator [phone]

## 2022-12-22 NOTE — Patient Instructions (Addendum)
Visit Information  Thank you for taking time to visit with me today. Please don't hesitate to contact me if I can be of assistance to you.   Following are the goals we discussed today:  Obtain your after visit summary from 12/17/22 I have sent a referral to the clinical pharmacist to review your medications with you Continue to attend provider visits as scheduled Contact your provider with health questions or concerns as needed  Our next appointment is by telephone on 01/07/23 at 9:15 am  Please call the care guide team at [phone] if you need to cancel or reschedule your appointment.   If you are experiencing a Mental Health or Behavioral Health Crisis or need someone to talk to, please call the Suicide and Crisis Lifeline: 56  Kathlen Brunswick, RN, MSN, BSN, [employer] [phone]

## 2022-12-25 ENCOUNTER — Emergency Department (HOSPITAL_COMMUNITY): Payer: Medicare PPO

## 2022-12-25 ENCOUNTER — Observation Stay (HOSPITAL_COMMUNITY)
Admission: EM | Admit: 2022-12-25 | Discharge: 2022-12-26 | Disposition: A | Discharge status: Home / Self Care | Payer: Medicare PPO | Attending: Internal Medicine | Admitting: Internal Medicine

## 2022-12-25 ENCOUNTER — Other Ambulatory Visit: Payer: Self-pay

## 2022-12-25 DIAGNOSIS — D649 Anemia, unspecified: Secondary | ICD-10-CM | POA: Insufficient documentation

## 2022-12-25 DIAGNOSIS — R079 Chest pain, unspecified: Secondary | ICD-10-CM | POA: Diagnosis not present

## 2022-12-25 DIAGNOSIS — Z7722 Contact with and (suspected) exposure to environmental tobacco smoke (acute) (chronic): Secondary | ICD-10-CM | POA: Diagnosis not present

## 2022-12-25 DIAGNOSIS — Z981 Arthrodesis status: Secondary | ICD-10-CM | POA: Diagnosis not present

## 2022-12-25 DIAGNOSIS — I10 Essential (primary) hypertension: Secondary | ICD-10-CM | POA: Diagnosis not present

## 2022-12-25 DIAGNOSIS — Z79899 Other long term (current) drug therapy: Secondary | ICD-10-CM | POA: Insufficient documentation

## 2022-12-25 DIAGNOSIS — R7989 Other specified abnormal findings of blood chemistry: Secondary | ICD-10-CM | POA: Insufficient documentation

## 2022-12-25 DIAGNOSIS — I161 Hypertensive emergency: Secondary | ICD-10-CM

## 2022-12-25 DIAGNOSIS — J45909 Unspecified asthma, uncomplicated: Secondary | ICD-10-CM | POA: Insufficient documentation

## 2022-12-25 DIAGNOSIS — E876 Hypokalemia: Secondary | ICD-10-CM | POA: Diagnosis present

## 2022-12-25 DIAGNOSIS — Z7982 Long term (current) use of aspirin: Secondary | ICD-10-CM | POA: Diagnosis not present

## 2022-12-25 LAB — MAGNESIUM: Magnesium: 1.8 mg/dL (ref 1.7–2.4)

## 2022-12-25 LAB — BASIC METABOLIC PANEL
Anion gap: 9 (ref 5–15)
Anion gap: 9 (ref 5–15)
BUN: 9 mg/dL (ref 8–23)
BUN: 9 mg/dL (ref 8–23)
CO2: 27 mmol/L (ref 22–32)
CO2: 26 mmol/L (ref 22–32)
Calcium: 8.8 mg/dL — ABNORMAL LOW (ref 8.9–10.3)
Calcium: 8.7 mg/dL — ABNORMAL LOW (ref 8.9–10.3)
Chloride: 96 mmol/L — ABNORMAL LOW (ref 98–111)
Chloride: 97 mmol/L — ABNORMAL LOW (ref 98–111)
Creatinine, Ser: 0.9 mg/dL (ref 0.44–1.00)
Creatinine, Ser: 0.75 mg/dL (ref 0.44–1.00)
GFR, Estimated: >60 mL/min (ref >60)
GFR, Estimated: >60 mL/min (ref >60)
Glucose, Bld: 142 mg/dL — ABNORMAL HIGH (ref 70–99)
Glucose, Bld: 156 mg/dL — ABNORMAL HIGH (ref 70–99)
Potassium: 2.3 mmol/L — CL (ref 3.5–5.1)
Potassium: 3.3 mmol/L — ABNORMAL LOW (ref 3.5–5.1)
Sodium: 132 mmol/L — ABNORMAL LOW (ref 135–145)
Sodium: 132 mmol/L — ABNORMAL LOW (ref 135–145)

## 2022-12-25 LAB — CBC
HCT: 25.9 % — ABNORMAL LOW (ref 36.0–46.0)
Hemoglobin: 8.8 g/dL — ABNORMAL LOW (ref 12.0–15.0)
MCH: 30.7 pg (ref 26.0–34.0)
MCHC: 34 g/dL (ref 30.0–36.0)
MCV: 90.2 fL (ref 80.0–100.0)
Platelets: 306 10*3/uL (ref 150–400)
RBC: 2.87 MIL/uL — ABNORMAL LOW (ref 3.87–5.11)
RDW: 12.2 % (ref 11.5–15.5)
WBC: 5.3 10*3/uL (ref 4.0–10.5)
nRBC: 0 % (ref 0.0–0.2)

## 2022-12-25 LAB — TROPONIN I (HIGH SENSITIVITY)
Troponin I (High Sensitivity): 19 ng/L — ABNORMAL HIGH (ref <18)
Troponin I (High Sensitivity): 31 ng/L — ABNORMAL HIGH (ref <18)

## 2022-12-25 MED ORDER — HYDRALAZINE HCL 25 MG PO TABS
75.0000 mg | ORAL_TABLET | Freq: Three times a day (TID) | ORAL | Status: DC
  Administered 2022-12-25 – 2022-12-26 (×2): 75 mg via ORAL
  Filled 2022-12-25 (×2): qty 3

## 2022-12-25 MED ORDER — LOSARTAN POTASSIUM 50 MG PO TABS: 100.0000 mg | ORAL_TABLET | Freq: Every day | ORAL | Status: DC

## 2022-12-25 MED ORDER — ANAGRELIDE HCL 0.5 MG PO CAPS
1.0000 mg | ORAL_CAPSULE | ORAL | Status: DC
  Administered 2022-12-26: 1 mg via ORAL
  Filled 2022-12-25: qty 2

## 2022-12-25 MED ORDER — ONDANSETRON HCL 4 MG/2ML IJ SOLN: 4.0000 mg | Freq: Four times a day (QID) | INTRAMUSCULAR | Status: DC | PRN

## 2022-12-25 MED ORDER — POTASSIUM CHLORIDE 10 MEQ/100ML IV SOLN
10.0000 meq | INTRAVENOUS | Status: AC
  Administered 2022-12-25 (×2): 10 meq via INTRAVENOUS
  Filled 2022-12-25 (×2): qty 100

## 2022-12-25 MED ORDER — GABAPENTIN 100 MG PO CAPS
100.0000 mg | ORAL_CAPSULE | Freq: Three times a day (TID) | ORAL | Status: DC
  Administered 2022-12-25 – 2022-12-26 (×2): 100 mg via ORAL
  Filled 2022-12-25 (×2): qty 1

## 2022-12-25 MED ORDER — ASPIRIN 325 MG PO TBEC
325.0000 mg | DELAYED_RELEASE_TABLET | Freq: Every day | ORAL | Status: DC
  Administered 2022-12-26: 325 mg via ORAL
  Filled 2022-12-25: qty 1

## 2022-12-25 MED ORDER — LOSARTAN POTASSIUM 50 MG PO TABS
100.0000 mg | ORAL_TABLET | Freq: Every day | ORAL | Status: DC
  Administered 2022-12-26: 100 mg via ORAL
  Filled 2022-12-25: qty 2

## 2022-12-25 MED ORDER — ACETAMINOPHEN 325 MG PO TABS: 650.0000 mg | ORAL_TABLET | ORAL | Status: DC | PRN

## 2022-12-25 MED ORDER — ISOSORBIDE MONONITRATE ER 30 MG PO TB24
30.0000 mg | ORAL_TABLET | Freq: Every day | ORAL | Status: DC
  Administered 2022-12-25: 30 mg via ORAL
  Filled 2022-12-25: qty 1

## 2022-12-25 MED ORDER — CARVEDILOL 12.5 MG PO TABS: 25.0000 mg | ORAL_TABLET | Freq: Two times a day (BID) | ORAL | Status: DC

## 2022-12-25 MED ORDER — ALBUTEROL SULFATE (2.5 MG/3ML) 0.083% IN NEBU: 3.0000 mL | INHALATION_SOLUTION | RESPIRATORY_TRACT | Status: DC | PRN

## 2022-12-25 MED ORDER — ENOXAPARIN SODIUM 40 MG/0.4ML IJ SOSY
40.0000 mg | PREFILLED_SYRINGE | INTRAMUSCULAR | Status: DC
  Administered 2022-12-25: 40 mg via SUBCUTANEOUS
  Filled 2022-12-25: qty 0.4

## 2022-12-25 MED ORDER — CARVEDILOL 12.5 MG PO TABS
25.0000 mg | ORAL_TABLET | Freq: Two times a day (BID) | ORAL | Status: DC
  Administered 2022-12-25 – 2022-12-26 (×2): 25 mg via ORAL
  Filled 2022-12-25 (×2): qty 2

## 2022-12-25 MED ORDER — POTASSIUM CHLORIDE CRYS ER 20 MEQ PO TBCR
20.0000 meq | EXTENDED_RELEASE_TABLET | Freq: Once | ORAL | Status: AC
  Administered 2022-12-26: 20 meq via ORAL
  Filled 2022-12-25: qty 1

## 2022-12-25 MED ORDER — POTASSIUM CHLORIDE CRYS ER 20 MEQ PO TBCR
40.0000 meq | EXTENDED_RELEASE_TABLET | Freq: Once | ORAL | Status: AC
  Administered 2022-12-25: 40 meq via ORAL
  Filled 2022-12-25: qty 2

## 2022-12-25 MED ORDER — ACETAMINOPHEN 325 MG PO TABS: 650.0000 mg | ORAL_TABLET | Freq: Four times a day (QID) | ORAL | Status: DC | PRN

## 2022-12-25 MED ORDER — MOMETASONE FURO-FORMOTEROL FUM 200-5 MCG/ACT IN AERO
2.0000 | INHALATION_SPRAY | Freq: Two times a day (BID) | RESPIRATORY_TRACT | Status: DC
  Administered 2022-12-25 – 2022-12-26 (×2): 2 via RESPIRATORY_TRACT
  Filled 2022-12-25: qty 8.8
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
## 2022-12-25 NOTE — ED Triage Notes (Signed)
Pt coming from home, daughter stated pt was complaining of chest pain. Pt thought it was just gas but when EMS arrived BP was 228/88 and 209/70 Has been taking blood pressure medication.

## 2022-12-25 NOTE — ED Provider Notes (Signed)
Paula Griffith Provider Note   CSN: [id number] Arrival date & time: 12/25/22  1155     History  Chief Complaint  Patient presents with   Hypertension    Paula Griffith is a 77 y.o. female with PMHx HTN who presents to ED concerned for HTN. Patient states that she had mild, quickly resolving chest pain today that felt like gas/acid reflux. Patient's daughter called EMS who found BP to be 228/88. Patient takes HTN medications and states that BP today is due to stress and eating SPAM for lunch. Patient without symptoms currently.  Denies recent head injury, AMS, visual changes, nausea, vomiting, back pain, dysnpea, drug use. No current chest pain.   Hypertension Associated symptoms include chest pain.       Home Medications Prior to Admission medications   Medication Sig Start Date End Date Taking? Authorizing Provider  acetaminophen (TYLENOL) 325 MG tablet Take 2 tablets (650 mg total) by mouth every 6 (six) hours as needed for mild pain (or Fever >/= 101). 09/05/22   Sheikh, Omair Latif, DO  albuterol (PROAIR HFA) 108 (90 Base) MCG/ACT inhaler Inhale 2 puffs into the lungs every 4 (four) hours as needed. For shortness of breath 08/07/19   [name], Georgina Quint, MD  amLODipine (NORVASC) 2.5 MG tablet Take 1 tablet (2.5 mg total) by mouth daily. Patient not taking: Reported on 12/17/2022 09/06/22   Paula Merles Latif, DO  anagrelide (AGRYLIN) 1 MG capsule Take 1 capsule (1 mg total) by mouth See admin instructions. TAKE 1 CAPSULE BY MOUTH DAILY EXCEPT 2 DAYS A WEEK (TUESDAYS AND THURSDAYS) DO NOT TAKE. 12/17/22   [name], Georgina Quint, MD  Ascorbic Acid (VITAMIN C) 1000 MG tablet Take 1,000 mg by mouth daily.    [provider]  aspirin 325 MG EC tablet Take 325 mg by mouth daily after breakfast.    [provider]  carvedilol (COREG) 25 MG tablet TAKE 1 TABLET BY MOUTH TWICE A DAY 10/12/22   [name], Georgina Quint, MD   Cholecalciferol (VITAMIN D3) 2000 units TABS Take 2,000 Units by mouth daily.    [provider]  Fluticasone-Salmeterol (ADVAIR) 250-50 MCG/DOSE AEPB Please specify directions, refills and quantity 08/07/19   [name], Georgina Quint, MD  gabapentin (NEURONTIN) 100 MG capsule TAKE 1 CAPSULE BY MOUTH THREE TIMES A DAY Patient taking differently: Take 100 mg by mouth 3 (three) times daily. 07/29/22   [name], Georgina Quint, MD  hydrALAZINE (APRESOLINE) 25 MG tablet Take 3 tablets (75 mg total) by mouth every 8 (eight) hours. 09/05/22   Paula Merles Latif, DO  losartan (COZAAR) 100 MG tablet Take 1 tablet (100 mg total) by mouth daily. Patient not taking: Reported on 12/17/2022 09/05/22   Paula Merles Latif, DO  Multiple Vitamins-Minerals (ADULT GUMMY PO) Take 2 tablets by mouth daily.    [provider]  ondansetron (ZOFRAN) 4 MG tablet Take 1 tablet (4 mg total) by mouth every 6 (six) hours as needed for nausea. Patient not taking: Reported on 12/17/2022 09/05/22   Paula Merles Latif, DO  silver sulfADIAZINE (SILVADENE) 1 % cream Apply topically daily. Patient not taking: Reported on 12/17/2022 09/05/22   Paula Merles Latif, DO  trolamine salicylate (ASPERCREME) 10 % cream Apply 1 application topically 2 (two) times daily as needed for muscle pain.    [provider]  losartan-hydrochlorothiazide (HYZAAR) 100-25 MG tablet Take 1 tablet by mouth daily. 04/03/19 06/28/19  [name], Georgina Quint, MD  Allergies    Thiazide-type diuretics, Benazepril, Amlodipine, Fluad [influenza vac a&b surf ant adj], Other, Codeine, and Tramadol    Review of Systems   Review of Systems  Cardiovascular:  Positive for chest pain.    Physical Exam Updated Vital Signs BP (!) 216/80   Pulse 68   Resp 13   SpO2 100%  Physical Exam Vitals and nursing note reviewed.  Constitutional:      General: She is not in acute distress.    Appearance: She is not ill-appearing, toxic-appearing or  diaphoretic.  HENT:     Head: Normocephalic and atraumatic.     Mouth/Throat:     Mouth: Mucous membranes are moist.  Eyes:     General: No scleral icterus.       Right eye: No discharge.        Left eye: No discharge.     Conjunctiva/sclera: Conjunctivae normal.  Cardiovascular:     Rate and Rhythm: Normal rate and regular rhythm.     Pulses: Normal pulses.     Heart sounds: Normal heart sounds. No murmur heard. Pulmonary:     Effort: Pulmonary effort is normal. No respiratory distress.     Breath sounds: Normal breath sounds. No wheezing, rhonchi or rales.  Abdominal:     General: Abdomen is flat. Bowel sounds are normal. There is no distension.     Palpations: Abdomen is soft. There is no mass.     Tenderness: There is no abdominal tenderness.  Musculoskeletal:     Right lower leg: No edema.     Left lower leg: No edema.  Skin:    General: Skin is warm and dry.     Findings: No rash.  Neurological:     General: No focal deficit present.     Mental Status: She is alert. Mental status is at baseline.     Comments: GCS 15. Speech is goal oriented. No deficits appreciated to CN III-XII; symmetric eyebrow raise, no facial drooping, tongue midline. Patient has equal grip strength bilaterally with 5/5 strength against resistance in all major muscle groups bilaterally. Sensation to light touch intact. Patient moves extremities without ataxia.    Psychiatric:        Mood and Affect: Mood normal.     ED Results / Procedures / Treatments   Labs (all labs ordered are listed, but only abnormal results are displayed) Labs Reviewed  BASIC METABOLIC PANEL - Abnormal; Notable for the following components:      Result Value   Sodium 132 (*)    Potassium 2.3 (*)    Chloride 96 (*)    Glucose, Bld 142 (*)    Calcium 8.8 (*)    All other components within normal limits  CBC - Abnormal; Notable for the following components:   RBC 2.87 (*)    Hemoglobin 8.8 (*)    HCT 25.9 (*)    All  other components within normal limits  TROPONIN I (HIGH SENSITIVITY) - Abnormal; Notable for the following components:   Troponin I (High Sensitivity) 19 (*)    All other components within normal limits  TROPONIN I (HIGH SENSITIVITY) - Abnormal; Notable for the following components:   Troponin I (High Sensitivity) 31 (*)    All other components within normal limits  MAGNESIUM    EKG EKG Interpretation Date/Time:  Friday December 25 2022 12:13:33 EDT Ventricular Rate:  83 PR Interval:  165 QRS Duration:  93 QT Interval:  399 QTC Calculation: 469 R  Axis:   -8  Text Interpretation: Sinus rhythm Probable left atrial enlargement Left ventricular hypertrophy Anterior Q waves, possibly due to LVH Confirmed by Glyn Ade [phone]) on 12/25/2022 12:15:49 PM  Radiology DG Chest 1 View  Result Date: 12/25/2022 CLINICAL DATA:  chest pain EXAM: CHEST  1 VIEW COMPARISON:  None Available. FINDINGS: No pleural effusion. No pneumothorax. No focal airspace opacity. Normal cardiac and mediastinal contours. No radiographically apparent displaced rib fractures. Visualized upper abdomen is unremarkable. Partially visualized lumbar spinal fusion hardware in place. Degenerative changes of the bilateral glenohumeral joints. IMPRESSION: No acute pulmonary disease. Electronically Signed   By: Lorenza Cambridge M.D.   On: 12/25/2022 12:31    Procedures Procedures    Medications Ordered in ED Medications  potassium chloride 10 mEq in 100 mL IVPB (10 mEq Intravenous New Bag/Given 12/25/22 1531)  potassium chloride SA (KLOR-CON M) CR tablet 40 mEq (40 mEq Oral Given 12/25/22 1425)    ED Course/ Medical Decision Making/ A&P Clinical Course as of 12/25/22 1602  Fri Dec 25, 2022  1213 Stable Currently asymptomatic, however earlier reported nonspecific chest pain which she attributed to gas discomfort. SBP elevated with with EMS brought in for further care and management. Will proceed down ACS workup, observe blood  pressure and reassess for hypertensive emergency.  Anticipate disposition per these results.  [CC]    Clinical Course User Index [CC] Glyn Ade, MD                             Medical Decision Making Amount and/or Complexity of Data Reviewed Labs: ordered. Radiology: ordered.  Risk Prescription drug management.   This patient presents to the ED for concern of chest pain, this involves an extensive number of treatment options, and is a complaint that carries with it a high risk of complications and morbidity.  The differential diagnosis includes acute coronary syndrome, congestive heart failure, pericarditis, pneumonia, pulmonary embolism, tension pneumothorax, esophageal rupture, aortic dissection, cardiac tamponade, musculoskeletal   Co morbidities that complicate the patient evaluation  HTN    Lab Tests:  I Ordered, and personally interpreted labs.  The pertinent results include:  - Troponin: 19 -> 31 - CBC: anemia (8.8); no leukocytosis - BMP: hypokalemia (2.3); mild hyponatremia (132) - Mag: within normal limits   Imaging Studies ordered:  I ordered imaging studies including  -chest xray: To assess for process contributing to patient's symptoms I independently visualized and interpreted imaging  I agree with the radiologist interpretation   Cardiac Monitoring: / EKG:  The patient was maintained on a cardiac monitor.  I personally viewed and interpreted the cardiac monitored which showed an underlying rhythm of: Sinus rhythm without acute ST changes or arrhythmias   Problem List / ED Course / Critical interventions / Medication management  Patient presented for mild, quickly resolving chest pain after eating lunch today. EMS provided patient with ASA. Patient states that she believes chest pain was gas/acid reflux. Physical exam unremarkable. Ordering ACS workup to evaluate for STEMI/NSTEMI.  EMS recorded BP 228/88 and they were concerned for HTN  emergency given HTN and chest pain. Patient states that chest pain resolved quickly without intervention and she is currently without symptoms. Patient stating that HTN is due to [facility] for lunch and stress from her daughter who made her eat the SPAM sandwich. Patient endorses compliance with hypertensive medicines.  Physical exam unremarkable. BP in ED room 187/68. Elevated to 240 systolic when  ambulating to bathroom -  still no symptoms.  BMP with hypokalemia (2.3). EKG without concern for hypokalemia such as inverted T waves or prominent U-waves. Provided patient with IV potassium and oral potassium supplementation. EKG without ST changes. Troponin increasing 19 -> 31. Consulted Cardiology Rosann Auerbach) who agreed that patient should be admitted for HTN Emergency. Consulting Hospitalist for HTN Emergency admission. Dr. Chipper Herb admitting. I have reviewed the patients home medicines and have made adjustments as needed   Ddx:  These are considered less likely due to history of present illness and physical exam findings.  Congestive heart failure: patient denies orthopnea, cough, and leg edema Pericarditis: pain is not positional and patient denies orthopnea and recent illness Pneumonia: lungs are clear to auscultation bilaterally Pulmonary embolism:no hypoxia or tachycardia Pneumothorax: lungs are clear to auscultation bilaterally Esophageal rupture: patient denies vomiting, heavy drinking, and hx of GERD Aortic dissection: vital signs are stable, no variation in pulse pressure Cardiac tamponade: absence of hypotension, JVD, and muffled heart sounds    Social Determinants of Health:  none          Final Clinical Impression(s) / ED Diagnoses Final diagnoses:  Hypertensive emergency    Rx / DC Orders ED Discharge Orders     None         Dorthy Cooler, [place] 12/25/22 1604    Glyn Ade, MD 12/28/22 1814

## 2022-12-25 NOTE — H&P (Signed)
History and Physical    [name] ZOX:[id number] DOB: [birth year] DOA: 12/25/2022  PCP: Tresa Garter, MD (Confirm with patient/family/NH records and if not entered, this has to be entered at [facility] point of entry) Patient coming from: Home  I have personally briefly reviewed patient's old medical records in [facility]  Chief Complaint: Feeling better  HPI: Paula Griffith is a 77 y.o. female with medical history significant of refractory HTN, myeloproliferative neoplasm/essential thrombocythemia on chronic chemotherapy, presented with new onset of chest pain headache and uncontrolled blood pressure.  Patient claims that recently she has been under increasing amounts of stress as last week her grandchildren went to the stay with her..  But she claimed that she has been compliant with her BP meds.  This morning, patient started to have cramping-like chest pain left-sided chest nonradiating, 7-8/10, denies any palpitations shortness of breath sweating nauseous vomiting and chest pain went away in last of 5 minutes by its own.  She also had blood headache on the left frontal area but denies any vision changes no numbness weakness upper and lower limbs.  Family reported patient has a chronic intermittent diarrhea and 2 loose bowel movements this morning.  Family called EMS EMS arrived found patient blood pressure 228/88  ED Course: Initial blood pressure 187/68 fluctuated to 220 systolic, chest pain and headache dissipated.  Blood work showed troponin 19> 31, no significant EKG changes.  Blood work showed K2.3  Review of Systems: As per HPI otherwise 14 point review of systems negative.    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   Spondylolisthesis of lumbar region    Thrombocytopathy [facility]) 2011   Dr Arline Asp    Past Surgical History:  Procedure Laterality Date   ABDOMINAL HYSTERECTOMY  1989   ANTERIOR CERVICAL DECOMP/DISCECTOMY FUSION N/A 09/22/2017   Procedure: ANTERIOR CERVICAL DECOMPRESSION AND FUSION CERVICAL THREE-FOUR.;  Surgeon: Tia Alert, MD;  Location: [facility] OR;  Service: Neurosurgery;  Laterality: N/A;  anterior   BACK SURGERY     BREAST EXCISIONAL BIOPSY Left    BREAST SURGERY     Left   LUMBAR LAMINECTOMY/DECOMPRESSION MICRODISCECTOMY Left 01/06/2018   Procedure: laminectomy Lumbar five -Sacral one - left, Lumbar two-Lumbar three - bilateral;  Surgeon: Tia Alert, MD;  Location: [facility] OR;  Service: Neurosurgery;  Laterality: Left;     reports that she has never smoked. She has been exposed to tobacco smoke. She has never used smokeless tobacco. She reports that she does not drink alcohol and does not use drugs.  Allergies  Allergen Reactions   Thiazide-Type Diuretics Other (See Comments)    Hyponatremia 119 in march 2023 (was taking HCTZ)   Benazepril Cough    cough   Amlodipine     Leg edema at 5 mg/d   Fluad [Influenza Vac A&B Surf Ant Adj]     Very sick   Other     BAND-AID CAUSE SKIN TEARS/BRUISES   Codeine  Nausea Only   Tramadol Other (See Comments)    Dizzy, nauseated    Family History  Problem Relation Age of Onset   Pancreatic cancer Mother    Arthritis Mother        RA   Early death Father 21       MI   Coronary artery disease Other        Female 1st degree relative Female <60   Lung cancer Other    Diabetes Sister    Breast cancer Maternal Grandmother    Colon cancer Neg Hx    Stomach cancer Neg Hx      Prior to Admission medications   Medication Sig Start Date End Date Taking? Authorizing Provider   acetaminophen (TYLENOL) 325 MG tablet Take 2 tablets (650 mg total) by mouth every 6 (six) hours as needed for mild pain (or Fever >/= 101). 09/05/22   Sheikh, Omair Latif, DO  albuterol (PROAIR HFA) 108 (90 Base) MCG/ACT inhaler Inhale 2 puffs into the lungs every 4 (four) hours as needed. For shortness of breath 08/07/19   [name], Georgina Quint, MD  amLODipine (NORVASC) 2.5 MG tablet Take 1 tablet (2.5 mg total) by mouth daily. Patient not taking: Reported on 12/17/2022 09/06/22   Marguerita Merles Latif, DO  anagrelide (AGRYLIN) 1 MG capsule Take 1 capsule (1 mg total) by mouth See admin instructions. TAKE 1 CAPSULE BY MOUTH DAILY EXCEPT 2 DAYS A WEEK (TUESDAYS AND THURSDAYS) DO NOT TAKE. 12/17/22   [name], Georgina Quint, MD  Ascorbic Acid (VITAMIN C) 1000 MG tablet Take 1,000 mg by mouth daily.    [provider]  aspirin 325 MG EC tablet Take 325 mg by mouth daily after breakfast.    [provider]  carvedilol (COREG) 25 MG tablet TAKE 1 TABLET BY MOUTH TWICE A DAY 10/12/22   [name], Georgina Quint, MD  Cholecalciferol (VITAMIN D3) 2000 units TABS Take 2,000 Units by mouth daily.    [provider]  Fluticasone-Salmeterol (ADVAIR) 250-50 MCG/DOSE AEPB Please specify directions, refills and quantity 08/07/19   [name], Georgina Quint, MD  gabapentin (NEURONTIN) 100 MG capsule TAKE 1 CAPSULE BY MOUTH THREE TIMES A DAY Patient taking differently: Take 100 mg by mouth 3 (three) times daily. 07/29/22   [name], Georgina Quint, MD  hydrALAZINE (APRESOLINE) 25 MG tablet Take 3 tablets (75 mg total) by mouth every 8 (eight) hours. 09/05/22   Marguerita Merles Latif, DO  losartan (COZAAR) 100 MG tablet Take 1 tablet (100 mg total) by mouth daily. Patient not taking: Reported on 12/17/2022 09/05/22   Marguerita Merles Latif, DO  Multiple Vitamins-Minerals (ADULT GUMMY PO) Take 2 tablets by mouth daily.    [provider]  ondansetron (ZOFRAN) 4 MG tablet Take 1 tablet (4 mg total) by mouth every 6 (six)  hours as needed for nausea. Patient not taking: Reported on 12/17/2022 09/05/22   Marguerita Merles Latif, DO  silver sulfADIAZINE (SILVADENE) 1 % cream Apply topically daily. Patient not taking: Reported on 12/17/2022 09/05/22   Marguerita Merles Latif, DO  trolamine salicylate (ASPERCREME) 10 % cream Apply 1 application topically 2 (two) times daily as needed for muscle pain.    [provider]  losartan-hydrochlorothiazide (HYZAAR) 100-25 MG tablet Take 1 tablet by mouth daily. 04/03/19 06/28/19  [name], Georgina Quint, MD    Physical Exam: Vitals:   12/25/22 1400 12/25/22 1530 12/25/22 1641  BP: (!) 187/68 (!) 216/80 (!) 234/99  Pulse: 69 68 72  Resp: 17 13  15  Temp:   98.3 F (36.8 C)  TempSrc:   Oral  SpO2: 100% 100% 99%    Constitutional: NAD, calm, comfortable Vitals:   12/25/22 1400 12/25/22 1530 12/25/22 1641  BP: (!) 187/68 (!) 216/80 (!) 234/99  Pulse: 69 68 72  Resp: 17 13 15   Temp:   98.3 F (36.8 C)  TempSrc:   Oral  SpO2: 100% 100% 99%   Eyes: PERRL, lids and conjunctivae normal ENMT: Mucous membranes are moist. Posterior pharynx clear of any exudate or lesions.Normal dentition.  Neck: normal, supple, no masses, no thyromegaly Respiratory: clear to auscultation bilaterally, no wheezing, no crackles. Normal respiratory effort. No accessory muscle use.  Cardiovascular: Regular rate and rhythm, no murmurs / rubs / gallops. No extremity edema. 2+ pedal pulses. No carotid bruits.  Bilateral equal radial pulses Abdomen: no tenderness, no masses palpated. No hepatosplenomegaly. Bowel sounds positive.  Musculoskeletal: no clubbing / cyanosis. No joint deformity upper and lower extremities. Good ROM, no contractures. Normal muscle tone.  Skin: no rashes, lesions, ulcers. No induration Neurologic: CN 2-12 grossly intact. Sensation intact, DTR normal. Strength 5/5 in all 4.  Psychiatric: Normal judgment and insight. Alert and oriented x 3. Normal mood.     Labs on Admission:  I have personally reviewed following labs and imaging studies  CBC: Recent Labs  Lab 12/25/22 1209  WBC 5.3  HGB 8.8*  HCT 25.9*  MCV 90.2  PLT 306   Basic Metabolic Panel: Recent Labs  Lab 12/25/22 1209 12/25/22 1415  NA 132*  --   K 2.3*  --   CL 96*  --   CO2 27  --   GLUCOSE 142*  --   BUN 9  --   CREATININE 0.90  --   CALCIUM 8.8*  --   MG  --  1.8   GFR: Estimated Creatinine Clearance: 43.3 mL/min (by C-G formula based on SCr of 0.9 mg/dL). Liver Function Tests: No results for input(s): "AST", "ALT", "ALKPHOS", "BILITOT", "PROT", "ALBUMIN" in the last 168 hours. No results for input(s): "LIPASE", "AMYLASE" in the last 168 hours. No results for input(s): "AMMONIA" in the last 168 hours. Coagulation Profile: No results for input(s): "INR", "PROTIME" in the last 168 hours. Cardiac Enzymes: No results for input(s): "CKTOTAL", "CKMB", "CKMBINDEX", "TROPONINI" in the last 168 hours. BNP (last 3 results) Recent Labs    04/02/22 1611  PROBNP 75.0   HbA1C: No results for input(s): "HGBA1C" in the last 72 hours. CBG: No results for input(s): "GLUCAP" in the last 168 hours. Lipid Profile: No results for input(s): "CHOL", "HDL", "LDLCALC", "TRIG", "CHOLHDL", "LDLDIRECT" in the last 72 hours. Thyroid Function Tests: No results for input(s): "TSH", "T4TOTAL", "FREET4", "T3FREE", "THYROIDAB" in the last 72 hours. Anemia Panel: No results for input(s): "VITAMINB12", "FOLATE", "FERRITIN", "TIBC", "IRON", "RETICCTPCT" in the last 72 hours. Urine analysis:    Component Value Date/Time   COLORURINE STRAW (A) 09/01/2022 1651   APPEARANCEUR CLEAR 09/01/2022 1651   LABSPEC 1.004 (L) 09/01/2022 1651   PHURINE 7.0 09/01/2022 1651   GLUCOSEU NEGATIVE 09/01/2022 1651   GLUCOSEU NEGATIVE 01/14/2022 1452   HGBUR NEGATIVE 09/01/2022 1651   BILIRUBINUR NEGATIVE 09/01/2022 1651   KETONESUR NEGATIVE 09/01/2022 1651   PROTEINUR 30 (A) 09/01/2022 1651   UROBILINOGEN 0.2  01/14/2022 1452   NITRITE NEGATIVE 09/01/2022 1651   LEUKOCYTESUR NEGATIVE 09/01/2022 1651    Radiological Exams on Admission: DG Chest 1 View  Result Date: 12/25/2022 CLINICAL DATA:  chest pain EXAM:  CHEST  1 VIEW COMPARISON:  None Available. FINDINGS: No pleural effusion. No pneumothorax. No focal airspace opacity. Normal cardiac and mediastinal contours. No radiographically apparent displaced rib fractures. Visualized upper abdomen is unremarkable. Partially visualized lumbar spinal fusion hardware in place. Degenerative changes of the bilateral glenohumeral joints. IMPRESSION: No acute pulmonary disease. Electronically Signed   By: Lorenza Cambridge M.D.   On: 12/25/2022 12:31    EKG: Independently reviewed.  Sinus, chronic anterior Q waves, no acute ST changes.  Assessment/Plan Principal Problem:   HTN (hypertension), malignant Active Problems:   Hypokalemia  (please populate well all problems here in Problem List. (For example, if patient is on BP meds at home and you resume or decide to hold them, it is a problem that needs to be her. Same for CAD, COPD, HLD and so on)  HTN emergency -Likely suspect noncompliance with HTN medication.  Went through patient's medication list at bedside with patient and family, patient confirmed taking 3 BP meds including Coreg, Losartan and hydralazine, on for about amlodipine which appears to be on the discharge medication list on the recent hospitalization in May 2024.  I asked patient's family to bring in patient's pillboxes tomorrow to clarify, and they agreed -Resume Losartan, Coreg, amlodipine and hydralazine, expect that blood pressure will be controlled after resuming home BP meds, if not, hydralazine still can be titrated up as well as amlodipine.  Aim of SBP reduction 25% tonight  Chest pain  Elevated troponins -Likely secondary to hypertension emergency, resolved. -Troponin elevation likely secondary to demand ischemia from hypertensive  emergency, ACS unlikely. -Other DDx, significant with significant elevation of blood pressure but no significant differences of pulses and blood pressure measurement on either side, arguing against dissection. -Continue aspirin and statin -Echo  Headache -Likely secondary to hypertension emergency, resolved.  Severe hypokalemia -Recurrent -IV and p.o. replacement, recheck level tonight and tomorrow morning -Mg=1.8  Anemia of chronic -H&H stable, outpatient follow-up with hematology  Chronic myeloproliferative neoplasm/thrombocythemia -Continue current chemotherapy and outpatient follow-up with oncology  DVT prophylaxis: Lovenox Code Status: Full code Family Communication: Two daughters at bedside Disposition Plan: Expect less than 2 midnight hospital stay Consults called: None Admission status: Tele obs   Emeline General MD [facility] Pager [phone]  12/25/2022, 4:49 PM

## 2022-12-25 NOTE — Progress Notes (Signed)
Appears that patient had leg swelling while on Amlodipine before. For now, will start Imdur 30 mg daily.

## 2022-12-26 ENCOUNTER — Other Ambulatory Visit (HOSPITAL_COMMUNITY): Payer: Self-pay

## 2022-12-26 DIAGNOSIS — I10 Essential (primary) hypertension: Secondary | ICD-10-CM | POA: Diagnosis not present

## 2022-12-26 LAB — BASIC METABOLIC PANEL
Anion gap: 7 (ref 5–15)
BUN: 8 mg/dL (ref 8–23)
CO2: 26 mmol/L (ref 22–32)
Calcium: 8.7 mg/dL — ABNORMAL LOW (ref 8.9–10.3)
Chloride: 100 mmol/L (ref 98–111)
Creatinine, Ser: 0.74 mg/dL (ref 0.44–1.00)
GFR, Estimated: >60 mL/min (ref >60)
Glucose, Bld: 94 mg/dL (ref 70–99)
Potassium: 3.4 mmol/L — ABNORMAL LOW (ref 3.5–5.1)
Sodium: 133 mmol/L — ABNORMAL LOW (ref 135–145)

## 2022-12-26 MED ORDER — LOSARTAN POTASSIUM 100 MG PO TABS
100.0000 mg | ORAL_TABLET | Freq: Every day | ORAL | 0 refills | Status: DC
  Filled 2022-12-26: qty 30, 30d supply, fill #0

## 2022-12-26 MED ORDER — POTASSIUM CHLORIDE CRYS ER 20 MEQ PO TBCR
40.0000 meq | EXTENDED_RELEASE_TABLET | Freq: Every day | ORAL | 0 refills | Status: DC
  Filled 2022-12-26: qty 6, 3d supply, fill #0

## 2022-12-26 MED ORDER — HYDRALAZINE HCL 25 MG PO TABS
75.0000 mg | ORAL_TABLET | Freq: Three times a day (TID) | ORAL | 0 refills | Status: DC
  Filled 2022-12-26: qty 90, 10d supply, fill #0

## 2022-12-26 MED ORDER — ISOSORBIDE MONONITRATE ER 60 MG PO TB24
60.0000 mg | ORAL_TABLET | Freq: Every day | ORAL | 0 refills | Status: DC
  Filled 2022-12-26: qty 30, 30d supply, fill #0

## 2022-12-26 MED ORDER — CARVEDILOL 25 MG PO TABS
25.0000 mg | ORAL_TABLET | Freq: Two times a day (BID) | ORAL | 0 refills | Status: DC
  Filled 2022-12-26: qty 60, 30d supply, fill #0

## 2022-12-26 MED ORDER — POTASSIUM CHLORIDE CRYS ER 20 MEQ PO TBCR
40.0000 meq | EXTENDED_RELEASE_TABLET | Freq: Once | ORAL | Status: AC
  Administered 2022-12-26: 40 meq via ORAL
  Filled 2022-12-26: qty 2

## 2022-12-26 MED ORDER — ISOSORBIDE MONONITRATE ER 60 MG PO TB24
60.0000 mg | ORAL_TABLET | Freq: Every day | ORAL | Status: DC
  Administered 2022-12-26: 60 mg via ORAL
  Filled 2022-12-26: qty 1

## 2022-12-26 NOTE — Discharge Summary (Signed)
Physician Discharge Summary  Paula Griffith ZOX:[id number] DOB: 09-Aug-1945 DOA: 12/25/2022  PCP: Tresa Garter, MD  Admit date: 12/25/2022 Discharge date: 12/26/2022  Admitted From: Home Disposition:  Home  Discharge Condition:Stable CODE STATUS:FULL Diet recommendation: Heart Healthy   Brief/Interim Summary: Patient is a 77 y.o. female with medical history significant of refractory HTN, myeloproliferative neoplasm/essential thrombocythemia on chronic chemotherapy, presented with new onset of chest pain headache and uncontrolled blood pressure.  On presentation her systolic blood pressure was in the range of 200s.  Noncompliance suspected.  She was restarted on her home medications, added Imdur.  Her chest pain has completely resolved today.  She remains very comfortable.  Denies any headache.  Mental blood pressure check this morning is significantly better.  She is medically stable for discharge home today with current medications for the blood pressure.  I have recommended her to follow-up with her PCP in a week and monitor her blood pressure at home  Following problems were addressed during the hospitalization:  HTN emergency -Management as above -Continue Imdur, hydralazine, losartan, Coreg   chest pain  Elevated troponins -Likely secondary to hypertension emergency, resolved. -Troponin elevation likely secondary to demand ischemia from hypertensive emergency, ACS unlikely. -Continue aspirin and statin -No indication for further workup   Headache -Likely secondary to hypertension emergency, resolved.   Severe hypokalemia -Supplemented   Anemia of chronic -H&H stable, outpatient follow-up with hematology   Chronic myeloproliferative neoplasm/thrombocythemia -Continue current chemotherapy and outpatient follow-up with oncology  Discharge Diagnoses:  Principal Problem:   HTN (hypertension), malignant Active Problems:   Hypokalemia    Discharge  Instructions  Discharge Instructions     Diet - low sodium heart healthy   Complete by: As directed    Discharge instructions   Complete by: As directed    1)Please take prescribed medications as instructed 2)Follow up with your PCP in a week.  Monitor blood pressure at home   Increase activity slowly   Complete by: As directed       Allergies as of 12/26/2022       Reactions   Thiazide-type Diuretics Other (See Comments)   Hyponatremia 119 in march 2023 (was taking HCTZ)   Benazepril Cough   cough   Amlodipine    Leg edema at 5 mg/d   Fluad [influenza Vac A&b Surf Ant Adj]    Very sick   Other    BAND-AID CAUSE SKIN TEARS/BRUISES   Codeine Nausea Only   Tramadol Other (See Comments)   Dizzy, nauseated        Medication List     STOP taking these medications    amLODipine 2.5 MG tablet Commonly known as: NORVASC       TAKE these medications    acetaminophen 325 MG tablet Commonly known as: TYLENOL Take 2 tablets (650 mg total) by mouth every 6 (six) hours as needed for mild pain (or Fever >/= 101).   ADULT GUMMY PO Take 2 tablets by mouth daily.   albuterol 108 (90 Base) MCG/ACT inhaler Commonly known as: ProAir HFA Inhale 2 puffs into the lungs every 4 (four) hours as needed. For shortness of breath What changed:  reasons to take this additional instructions   anagrelide 1 MG capsule Commonly known as: AGRYLIN Take 1 capsule (1 mg total) by mouth See admin instructions. TAKE 1 CAPSULE BY MOUTH DAILY EXCEPT 2 DAYS A WEEK (TUESDAYS AND THURSDAYS) DO NOT TAKE. What changed: additional instructions   aspirin EC 325 MG tablet  Take 325 mg by mouth daily after breakfast.   carvedilol 25 MG tablet Commonly known as: COREG Take 1 tablet (25 mg total) by mouth 2 (two) times daily.   Fluticasone-Salmeterol 250-50 MCG/DOSE Aepb Commonly known as: ADVAIR Please specify directions, refills and quantity What changed:  how much to take how to take  this when to take this additional instructions   gabapentin 100 MG capsule Commonly known as: NEURONTIN TAKE 1 CAPSULE BY MOUTH THREE TIMES A DAY What changed: See the new instructions.   hydrALAZINE 25 MG tablet Commonly known as: APRESOLINE Take 3 tablets (75 mg total) by mouth every 8 (eight) hours.   isosorbide mononitrate 60 MG 24 hr tablet Commonly known as: IMDUR Take 1 tablet (60 mg total) by mouth daily. Start taking on: December 27, 2022   losartan 100 MG tablet Commonly known as: COZAAR Take 1 tablet (100 mg total) by mouth daily.   ondansetron 4 MG tablet Commonly known as: ZOFRAN Take 1 tablet (4 mg total) by mouth every 6 (six) hours as needed for nausea.   potassium chloride SA 20 MEQ tablet Commonly known as: KLOR-CON M Take 2 tablets (40 mEq total) by mouth daily for 3 days. Start taking on: December 27, 2022   silver sulfADIAZINE 1 % cream Commonly known as: SILVADENE Apply topically daily. What changed: how much to take   trolamine salicylate 10 % cream Commonly known as: ASPERCREME Apply 1 application topically 2 (two) times daily as needed for muscle pain.   vitamin C 1000 MG tablet Take 1,000 mg by mouth daily.   Vitamin D3 50 MCG (2000 UT) Tabs Take 2,000 Units by mouth daily.        Follow-up Information     [name], Georgina Quint, MD. Schedule an appointment as soon as possible for a visit in 1 week(s).   Specialty: Internal Medicine Contact information: [address] [place] [address] [phone]                Allergies  Allergen Reactions   Thiazide-Type Diuretics Other (See Comments)    Hyponatremia 119 in march 2023 (was taking HCTZ)   Benazepril Cough    cough   Amlodipine     Leg edema at 5 mg/d   Fluad [Influenza Vac A&B Surf Ant Adj]     Very sick   Other     BAND-AID CAUSE SKIN TEARS/BRUISES   Codeine Nausea Only   Tramadol Other (See Comments)    Dizzy, nauseated     Consultations: None   Procedures/Studies: DG Chest 1 View  Result Date: 12/25/2022 CLINICAL DATA:  chest pain EXAM: CHEST  1 VIEW COMPARISON:  None Available. FINDINGS: No pleural effusion. No pneumothorax. No focal airspace opacity. Normal cardiac and mediastinal contours. No radiographically apparent displaced rib fractures. Visualized upper abdomen is unremarkable. Partially visualized lumbar spinal fusion hardware in place. Degenerative changes of the bilateral glenohumeral joints. IMPRESSION: No acute pulmonary disease. Electronically Signed   By: Lorenza Cambridge M.D.   On: 12/25/2022 12:31      Subjective: Patient seen and examined the bedside today.  Hemodynamically stable.  Comfortable.  Desperate to go home today.  Blood pressure better.  Denies chest pain or headache.I called and discussed with daughter on phone about discharge planning  Discharge Exam: Vitals:   12/26/22 1055 12/26/22 1100  BP: (!) 152/48 (!) 146/64  Pulse:    Resp:    Temp:    SpO2:  Vitals:   12/26/22 0805 12/26/22 1050 12/26/22 1055 12/26/22 1100  BP: (!) 176/60 (!) 150/50 (!) 152/48 (!) 146/64  Pulse: 82     Resp: 14     Temp: 98.8 F (37.1 C)     TempSrc: Oral     SpO2:        General: Pt is alert, awake, not in acute distress Cardiovascular: RRR, S1/S2 +, no rubs, no gallops Respiratory: CTA bilaterally, no wheezing, no rhonchi Abdominal: Soft, NT, ND, bowel sounds + Extremities: no edema, no cyanosis    The results of significant diagnostics from this hospitalization (including imaging, microbiology, ancillary and laboratory) are listed below for reference.     Microbiology: No results found for this or any previous visit (from the past 240 hour(s)).   Labs: BNP (last 3 results) No results for input(s): "BNP" in the last 8760 hours. Basic Metabolic Panel: Recent Labs  Lab 12/25/22 1209 12/25/22 1415 12/25/22 2046 12/26/22 0156  NA 132*  --  132* 133*  K 2.3*  --   3.3* 3.4*  CL 96*  --  97* 100  CO2 27  --  26 26  GLUCOSE 142*  --  156* 94  BUN 9  --  9 8  CREATININE 0.90  --  0.75 0.74  CALCIUM 8.8*  --  8.7* 8.7*  MG  --  1.8  --   --    Liver Function Tests: No results for input(s): "AST", "ALT", "ALKPHOS", "BILITOT", "PROT", "ALBUMIN" in the last 168 hours. No results for input(s): "LIPASE", "AMYLASE" in the last 168 hours. No results for input(s): "AMMONIA" in the last 168 hours. CBC: Recent Labs  Lab 12/25/22 1209  WBC 5.3  HGB 8.8*  HCT 25.9*  MCV 90.2  PLT 306   Cardiac Enzymes: No results for input(s): "CKTOTAL", "CKMB", "CKMBINDEX", "TROPONINI" in the last 168 hours. BNP: Invalid input(s): "POCBNP" CBG: No results for input(s): "GLUCAP" in the last 168 hours. D-Dimer No results for input(s): "DDIMER" in the last 72 hours. Hgb A1c No results for input(s): "HGBA1C" in the last 72 hours. Lipid Profile No results for input(s): "CHOL", "HDL", "LDLCALC", "TRIG", "CHOLHDL", "LDLDIRECT" in the last 72 hours. Thyroid function studies No results for input(s): "TSH", "T4TOTAL", "T3FREE", "THYROIDAB" in the last 72 hours.  Invalid input(s): "FREET3" Anemia work up No results for input(s): "VITAMINB12", "FOLATE", "FERRITIN", "TIBC", "IRON", "RETICCTPCT" in the last 72 hours. Urinalysis    Component Value Date/Time   COLORURINE STRAW (A) 09/01/2022 1651   APPEARANCEUR CLEAR 09/01/2022 1651   LABSPEC 1.004 (L) 09/01/2022 1651   PHURINE 7.0 09/01/2022 1651   GLUCOSEU NEGATIVE 09/01/2022 1651   GLUCOSEU NEGATIVE 01/14/2022 1452   HGBUR NEGATIVE 09/01/2022 1651   BILIRUBINUR NEGATIVE 09/01/2022 1651   KETONESUR NEGATIVE 09/01/2022 1651   PROTEINUR 30 (A) 09/01/2022 1651   UROBILINOGEN 0.2 01/14/2022 1452   NITRITE NEGATIVE 09/01/2022 1651   LEUKOCYTESUR NEGATIVE 09/01/2022 1651   Sepsis Labs Recent Labs  Lab 12/25/22 1209  WBC 5.3   Microbiology No results found for this or any previous visit (from the past 240  hour(s)).  Please note: You were cared for by a hospitalist during your hospital stay. Once you are discharged, your primary care physician will handle any further medical issues. Please note that NO REFILLS for any discharge medications will be authorized once you are discharged, as it is imperative that you return to your primary care physician (or establish a relationship with a primary care physician  if you do not have one) for your post hospital discharge needs so that they can reassess your need for medications and monitor your lab values.    Time coordinating discharge: 40 minutes  SIGNED:   Burnadette Pop, MD  [facility] 12/26/2022, 11:18 AM Pager [phone]  If 7PM-7AM, please contact night-coverage [email or web address] Password [email or web address]

## 2022-12-29 ENCOUNTER — Emergency Department (HOSPITAL_COMMUNITY)
Admission: EM | Admit: 2022-12-29 | Discharge: 2022-12-29 | Disposition: A | Discharge status: Home / Self Care | Payer: Medicare PPO | Attending: Emergency Medicine | Admitting: Emergency Medicine

## 2022-12-29 ENCOUNTER — Emergency Department (HOSPITAL_COMMUNITY): Payer: Medicare PPO

## 2022-12-29 ENCOUNTER — Telehealth: Payer: Self-pay

## 2022-12-29 ENCOUNTER — Other Ambulatory Visit: Payer: Self-pay

## 2022-12-29 ENCOUNTER — Ambulatory Visit (INDEPENDENT_AMBULATORY_CARE_PROVIDER_SITE_OTHER): Payer: Medicare PPO

## 2022-12-29 VITALS — BP 219/105 | HR 77 | Ht 63.0 in | Wt 128.0 lb

## 2022-12-29 DIAGNOSIS — Z7951 Long term (current) use of inhaled steroids: Secondary | ICD-10-CM | POA: Diagnosis not present

## 2022-12-29 DIAGNOSIS — Z0000 Encounter for general adult medical examination without abnormal findings: Secondary | ICD-10-CM

## 2022-12-29 DIAGNOSIS — Z79899 Other long term (current) drug therapy: Secondary | ICD-10-CM | POA: Diagnosis not present

## 2022-12-29 DIAGNOSIS — Z856 Personal history of leukemia: Secondary | ICD-10-CM | POA: Insufficient documentation

## 2022-12-29 DIAGNOSIS — Z7982 Long term (current) use of aspirin: Secondary | ICD-10-CM | POA: Diagnosis not present

## 2022-12-29 DIAGNOSIS — J45909 Unspecified asthma, uncomplicated: Secondary | ICD-10-CM | POA: Diagnosis not present

## 2022-12-29 DIAGNOSIS — I10 Essential (primary) hypertension: Secondary | ICD-10-CM | POA: Diagnosis not present

## 2022-12-29 DIAGNOSIS — R079 Chest pain, unspecified: Secondary | ICD-10-CM | POA: Diagnosis not present

## 2022-12-29 LAB — COMPREHENSIVE METABOLIC PANEL
ALT: 10 U/L (ref 0–44)
AST: 23 U/L (ref 15–41)
Albumin: 3.4 g/dL — ABNORMAL LOW (ref 3.5–5.0)
Alkaline Phosphatase: 46 U/L (ref 38–126)
Anion gap: 11 (ref 5–15)
BUN: 9 mg/dL (ref 8–23)
CO2: 21 mmol/L — ABNORMAL LOW (ref 22–32)
Calcium: 9.5 mg/dL (ref 8.9–10.3)
Chloride: 98 mmol/L (ref 98–111)
Creatinine, Ser: 0.81 mg/dL (ref 0.44–1.00)
GFR, Estimated: >60 mL/min (ref >60)
Glucose, Bld: 123 mg/dL — ABNORMAL HIGH (ref 70–99)
Potassium: 4.8 mmol/L (ref 3.5–5.1)
Sodium: 130 mmol/L — ABNORMAL LOW (ref 135–145)
Total Bilirubin: 0.8 mg/dL (ref 0.3–1.2)
Total Protein: 6.7 g/dL (ref 6.5–8.1)

## 2022-12-29 LAB — TROPONIN I (HIGH SENSITIVITY)
Troponin I (High Sensitivity): 12 ng/L (ref <18)
Troponin I (High Sensitivity): 13 ng/L (ref <18)

## 2022-12-29 LAB — CBC WITH DIFFERENTIAL/PLATELET
Abs Immature Granulocytes: 0.02 10*3/uL (ref 0.00–0.07)
Basophils Absolute: 0.1 10*3/uL (ref 0.0–0.1)
Basophils Relative: 1 %
Eosinophils Absolute: 0.1 10*3/uL (ref 0.0–0.5)
Eosinophils Relative: 1 %
HCT: 25.4 % — ABNORMAL LOW (ref 36.0–46.0)
Hemoglobin: 8.4 g/dL — ABNORMAL LOW (ref 12.0–15.0)
Immature Granulocytes: 0 %
Lymphocytes Relative: 17 %
Lymphs Abs: 1.2 10*3/uL (ref 0.7–4.0)
MCH: 31.2 pg (ref 26.0–34.0)
MCHC: 33.1 g/dL (ref 30.0–36.0)
MCV: 94.4 fL (ref 80.0–100.0)
Monocytes Absolute: 0.6 10*3/uL (ref 0.1–1.0)
Monocytes Relative: 9 %
Neutro Abs: 5.1 10*3/uL (ref 1.7–7.7)
Neutrophils Relative %: 72 %
Platelets: 303 10*3/uL (ref 150–400)
RBC: 2.69 MIL/uL — ABNORMAL LOW (ref 3.87–5.11)
RDW: 12 % (ref 11.5–15.5)
WBC: 7 10*3/uL (ref 4.0–10.5)
nRBC: 0 % (ref 0.0–0.2)

## 2022-12-29 MED ORDER — HYDRALAZINE HCL 25 MG PO TABS
25.0000 mg | ORAL_TABLET | Freq: Once | ORAL | Status: AC
  Administered 2022-12-29: 25 mg via ORAL
  Filled 2022-12-29: qty 1

## 2022-12-29 MED ORDER — CARVEDILOL 12.5 MG PO TABS
25.0000 mg | ORAL_TABLET | Freq: Once | ORAL | Status: AC
  Administered 2022-12-29: 25 mg via ORAL
  Filled 2022-12-29: qty 2

## 2022-12-29 MED ORDER — LOSARTAN POTASSIUM 50 MG PO TABS: 100.0000 mg | ORAL_TABLET | Freq: Once | ORAL | Status: DC

## 2022-12-29 MED ORDER — LABETALOL HCL 5 MG/ML IV SOLN
10.0000 mg | Freq: Once | INTRAVENOUS | Status: AC
  Administered 2022-12-29: 10 mg via INTRAVENOUS
  Filled 2022-12-29: qty 4

## 2022-12-29 NOTE — Progress Notes (Addendum)
Subjective:   Paula Griffith is a 77 y.o. female who presents for Medicare Annual (Subsequent) preventive examination.  Visit Complete: Virtual  I connected with  [name] on 12/29/22 by a audio enabled telemedicine application and verified that I am speaking with the correct person using two identifiers.  Patient Location: Home  Provider Location: Office/Clinic  I discussed the limitations of evaluation and management by telemedicine. The patient expressed understanding and agreed to proceed.  Review of Systems     Cardiac Risk Factors include: advanced age (>77men, >25 women);dyslipidemia;family history of premature cardiovascular disease;hypertension;Other (see comment), Risk factor comments: stress     Objective:    Today's Vitals   12/29/22 1503 12/29/22 1530  BP: (!) 215/105 (!) 219/105  Pulse: 77   Weight: 128 lb (58.1 kg)   Height: 5\' 3"  (1.6 m)   PainSc: 0-No pain    Body mass index is 22.67 kg/m.     12/29/2022    3:05 PM 09/01/2022   10:27 PM 09/01/2022    4:05 PM 04/26/2022   11:14 AM 01/14/2022    8:55 PM 01/14/2022    5:51 PM 09/29/2021    9:49 AM  Advanced Directives  Does Patient Have a Medical Advance Directive? No No No No No No Yes  Type of Advance Directive       Living will;Healthcare Power of Attorney  Does patient want to make changes to medical advance directive?       No - Patient declined  Copy of Healthcare Power of Attorney in Chart?       No - copy requested  Would patient like information on creating a medical advance directive? No - Patient declined Yes (Inpatient - patient requests chaplain consult to create a medical advance directive)  No - Patient declined No - Patient declined      Current Medications (verified) Outpatient Encounter Medications as of 12/29/2022  Medication Sig   acetaminophen (TYLENOL) 325 MG tablet Take 2 tablets (650 mg total) by mouth every 6 (six) hours as needed for mild pain (or Fever >/= 101).   albuterol  (PROAIR HFA) 108 (90 Base) MCG/ACT inhaler Inhale 2 puffs into the lungs every 4 (four) hours as needed. For shortness of breath (Patient taking differently: Inhale 2 puffs into the lungs every 4 (four) hours as needed for shortness of breath.)   anagrelide (AGRYLIN) 1 MG capsule Take 1 capsule (1 mg total) by mouth See admin instructions. TAKE 1 CAPSULE BY MOUTH DAILY EXCEPT 2 DAYS A WEEK (TUESDAYS AND THURSDAYS) DO NOT TAKE. (Patient taking differently: Take 1 mg by mouth See admin instructions. TAKE 1 CAPSULE BY MOUTH DAILY EXCEPT 2 DAYS A WEEK (TUESDAYS AND THURSDAYS ONLY PER DAUGHTER)   Ascorbic Acid (VITAMIN C) 1000 MG tablet Take 1,000 mg by mouth daily.   aspirin 325 MG EC tablet Take 325 mg by mouth daily after breakfast.   carvedilol (COREG) 25 MG tablet Take 1 tablet (25 mg total) by mouth 2 (two) times daily.   Cholecalciferol (VITAMIN D3) 2000 units TABS Take 2,000 Units by mouth daily.   Fluticasone-Salmeterol (ADVAIR) 250-50 MCG/DOSE AEPB Please specify directions, refills and quantity (Patient taking differently: Inhale 1 puff into the lungs daily.)   gabapentin (NEURONTIN) 100 MG capsule TAKE 1 CAPSULE BY MOUTH THREE TIMES A DAY (Patient taking differently: Take 100 mg by mouth 3 (three) times daily.)   hydrALAZINE (APRESOLINE) 25 MG tablet Take 3 tablets (75 mg total) by mouth every  8 (eight) hours.   isosorbide mononitrate (IMDUR) 60 MG 24 hr tablet Take 1 tablet (60 mg total) by mouth daily.   losartan (COZAAR) 100 MG tablet Take 1 tablet (100 mg total) by mouth daily.   Multiple Vitamins-Minerals (ADULT GUMMY PO) Take 2 tablets by mouth daily.   ondansetron (ZOFRAN) 4 MG tablet Take 1 tablet (4 mg total) by mouth every 6 (six) hours as needed for nausea.   potassium chloride SA (KLOR-CON M) 20 MEQ tablet Take 2 tablets (40 mEq total) by mouth daily for 3 days.   silver sulfADIAZINE (SILVADENE) 1 % cream Apply topically daily. (Patient taking differently: Apply 1 Application  topically daily.)   trolamine salicylate (ASPERCREME) 10 % cream Apply 1 application topically 2 (two) times daily as needed for muscle pain.   [DISCONTINUED] losartan-hydrochlorothiazide (HYZAAR) 100-25 MG tablet Take 1 tablet by mouth daily.   No facility-administered encounter medications on file as of 12/29/2022.    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   Spondylolisthesis of lumbar region    Thrombocytopathy [facility]) 2011   Dr Arline Asp   Past Surgical History:  Procedure Laterality Date   ABDOMINAL HYSTERECTOMY  1989   ANTERIOR CERVICAL DECOMP/DISCECTOMY FUSION N/A 09/22/2017   Procedure: ANTERIOR CERVICAL DECOMPRESSION AND FUSION CERVICAL THREE-FOUR.;  Surgeon: Tia Alert, MD;  Location: [facility] OR;  Service: Neurosurgery;  Laterality: N/A;  anterior   BACK SURGERY     BREAST EXCISIONAL BIOPSY Left    BREAST SURGERY     Left   LUMBAR LAMINECTOMY/DECOMPRESSION MICRODISCECTOMY Left 01/06/2018   Procedure: laminectomy Lumbar five -Sacral one - left, Lumbar two-Lumbar three - bilateral;  Surgeon: Tia Alert, MD;  Location: [facility] OR;  Service: Neurosurgery;  Laterality: Left;   Family History  Problem Relation Age of Onset   Pancreatic cancer Mother    Arthritis Mother        RA   Early death Father 71       MI   Coronary artery disease Other        Female 1st degree relative Female <60   Lung cancer Other    Diabetes Sister     Breast cancer Maternal Grandmother    Colon cancer Neg Hx    Stomach cancer Neg Hx    Social History   Socioeconomic History   Marital status: Widowed    Spouse name: Not on file   Number of children: Not on file   Years of education: Not on file   Highest education level: Not on file  Occupational History   Occupation: Retired    Comment: but wking  Tobacco Use   Smoking status: Never    Passive exposure: Past   Smokeless tobacco: Never  Vaping Use   Vaping Use: Never used  Substance and Sexual Activity   Alcohol use: No    Alcohol/week:  0.0 standard drinks of alcohol   Drug use: No   Sexual activity: Not on file  Other Topics Concern   Not on file  Social History Narrative   Not on file   Social Determinants of Health   Financial Resource Strain: Low Risk  (12/29/2022)   Overall Financial Resource Strain (CARDIA)    Difficulty of Paying Living Expenses: Not hard at all  Food Insecurity: No Food Insecurity (12/29/2022)   Hunger Vital Sign    Worried About Running Out of Food in the Last Year: Never true    Ran Out of Food in the Last Year: Never true  Transportation Needs: No Transportation Needs (12/29/2022)   PRAPARE - Administrator, Civil Service (Medical): No    Lack of Transportation (Non-Medical): No  Physical Activity: Sufficiently Active (12/29/2022)   Exercise Vital Sign    Days of Exercise per Week: 7 days    Minutes of Exercise per Session: 30 min  Stress: Stress Concern Present (12/29/2022)   [employer] of Occupational Health - Occupational Stress Questionnaire    Feeling of Stress : To some extent  Social Connections: Moderately Integrated (12/29/2022)   Social Connection and Isolation Panel [NHANES]    Frequency of Communication with Friends and Family: More than three times a week    Frequency of Social Gatherings with Friends and Family: More than three times a week    Attends Religious Services: More than 4 times per year    Active  Member of [employer] or Organizations: Yes    Attends Banker Meetings: More than 4 times per year    Marital Status: Widowed    Tobacco Counseling Counseling given: Not Answered   Clinical Intake:  Pre-visit preparation completed: Yes  Pain : No/denies pain Pain Score: 0-No pain     BMI - recorded: 22.67 Nutritional Status: BMI of 19-24  Normal Nutritional Risks: None Diabetes: No  How often do you need to have someone help you when you read instructions, pamphlets, or other written materials from your doctor or pharmacy?: 1 - Never What is the last grade level you completed in school?: HSG  Interpreter Needed?: No  Information entered by :: Mikayla Chiusano N. Ivon Oelkers, LPN.   Activities of Daily Living    12/29/2022    3:10 PM 12/25/2022    6:20 PM  In your present state of health, do you have any difficulty performing the following activities:  Hearing? 0 0  Vision? 0 0  Difficulty concentrating or making decisions? 1 0  Walking or climbing stairs? 0 0  Dressing or bathing? 0 0  Doing errands, shopping? 0 0  Preparing Food and eating ? N   Using the Toilet? N   In the past six months, have you accidently leaked urine? N   Do you have problems with loss of bowel control? Y   Managing your Medications? N   Managing your Finances? N   Housekeeping or managing your Housekeeping? N     Patient Care Team: [name], Georgina Quint, MD as PCP - General Chuck Hint, MD (Vascular Surgery) Nahser, Deloris Ping, MD as Consulting Physician (Cardiology) Malachy Mood, MD as Consulting Physician (Hematology) Arman Bogus, MD as Consulting Physician (Neurosurgery) [facility] as Consulting Physician (Ophthalmology) Colletta Maryland, RN as [facility] Care Management  Indicate any recent Medical Services you may have received from other than [facility] providers in the past year (date may be approximate).  Assessment:   This is a  routine wellness examination for [place].  Hearing/Vision screen Hearing Screening - Comments:: Denies hearing difficulties   Vision Screening - Comments:: Wears reading glasses - up to date with routine eye exams with [facility]   Dietary issues and exercise activities discussed:     Goals Addressed             This Visit's Progress    Client understands the importance of follow-up with providers by attending scheduled visits.        Depression Screen    12/29/2022    3:40 PM 12/17/2022    2:39 PM 09/01/2022    3:11 PM 06/17/2022    1:45 PM 05/06/2022   10:47 AM 02/03/2022   10:37 AM 09/29/2021    9:36 AM  PHQ 2/9 Scores  PHQ - 2 Score 0 0 0 0 0 0 0  PHQ- 9 Score 0  0 0 0 0     Fall Risk    12/29/2022    3:06 PM 12/17/2022    2:40 PM 12/17/2022    2:39 PM 09/16/2022    2:38 PM 09/01/2022    3:11 PM  Fall Risk   Falls in the past year? 1 1 0 1 0  Number falls in past yr: 1 0 0 1 0  Injury with Fall? 1  0 0 0  Risk for fall due to : Impaired balance/gait  No Fall Risks Impaired balance/gait;History of fall(s)   Follow up Falls evaluation completed;Education provided;Falls prevention discussed  Falls evaluation completed Falls evaluation completed Falls evaluation completed    MEDICARE RISK AT HOME:  Medicare Risk at Home - 12/29/22 1507     Any stairs in or around the home? No   3 steps in the back, 12 in the front   If so, are there any without handrails? Yes    Home free of loose throw rugs in walkways, pet beds, electrical cords, etc? Yes    Adequate lighting in your home to reduce risk of falls? Yes    Life alert? Yes   necklace   Use of a cane, walker or w/c? Yes    Grab bars in the bathroom? No    Shower chair or bench in shower? No    Elevated toilet seat or a handicapped toilet? Yes             TIMED UP AND GO:  Was the test performed?  No    Cognitive Function:        12/29/2022    3:16 PM 09/29/2021    9:55 AM  6CIT Screen  What Year? 0 points  0 points  What month? 0 points 0 points  What time? 0 points 0 points  Count back from 20 0 points 0 points  Months in reverse 4 points 0 points  Repeat phrase 0 points 0 points  Total Score 4 points 0 points    Immunizations Immunization History  Administered Date(s) Administered   Fluad Quad(high Dose 65+) 04/03/2019, 08/14/2020, 03/10/2021   Influenza Split 04/08/2011, 04/08/2012   Influenza, High Dose Seasonal PF 04/20/2016, 02/23/2017, 03/01/2018   Influenza,inj,Quad PF,6+ Mos 03/15/2013, 04/23/2014, 03/21/2015   Moderna SARS-COV2 Booster Vaccination 04/20/2020   Moderna Sars-Covid-2 Vaccination 08/21/2019, 09/19/2019   Pneumococcal Conjugate-13 08/24/2016   Pneumococcal Polysaccharide-23 12/09/2012   Tdap 04/20/2016    TDAP status: Up to date  Flu Vaccine status: Due, Education has been provided regarding the importance of this  vaccine. Advised may receive this vaccine at local pharmacy or Health Dept. Aware to provide a copy of the vaccination record if obtained from local pharmacy or Health Dept. Verbalized acceptance and understanding.  Pneumococcal vaccine status: Up to date  Covid-19 vaccine status: Completed vaccines  Qualifies for Shingles Vaccine? Yes   Zostavax completed No   Shingrix Completed?: No.    Education has been provided regarding the importance of this vaccine. Patient has been advised to call insurance company to determine out of pocket expense if they have not yet received this vaccine. Advised may also receive vaccine at local pharmacy or Health Dept. Verbalized acceptance and understanding.  Screening Tests Health Maintenance  Topic Date Due   Hepatitis C Screening  Never done   DEXA SCAN  Never done   COVID-19 Vaccine (3 - Moderna risk series) 05/18/2020   Medicare Annual Wellness (AWV)  12/29/2023   DTaP/Tdap/Td (2 - Td or Tdap) 04/20/2026   Pneumonia Vaccine 43+ Years old  Completed   HPV VACCINES  Aged Out   INFLUENZA VACCINE   Discontinued   Colonoscopy  Discontinued   Zoster Vaccines- Shingrix  Discontinued    Health Maintenance  Health Maintenance Due  Topic Date Due   Hepatitis C Screening  Never done   DEXA SCAN  Never done   COVID-19 Vaccine (3 - Moderna risk series) 05/18/2020    Colorectal cancer screening: No longer required.   Mammogram status: No longer required due to age/patient denial.  Bone Density status: Never done  Lung Cancer Screening: (Low Dose CT Chest recommended if Age 21-80 years, 20 pack-year currently smoking OR have quit w/in 15years.) does not qualify.   Lung Cancer Screening Referral: no  Additional Screening:  Hepatitis C Screening: does qualify; Completed no  Vision Screening: Recommended annual ophthalmology exams for early detection of glaucoma and other disorders of the eye. Is the patient up to date with their annual eye exam?  Yes  Who is the provider or what is the name of the office in which the patient attends annual eye exams? [facility] If pt is not established with a provider, would they like to be referred to a provider to establish care? No .   Dental Screening: Recommended annual dental exams for proper oral hygiene  Diabetic Foot Exam: N/A  Community Resource Referral / Chronic Care Management: CRR required this visit?  No   CCM required this visit?  No     Plan:     I have personally reviewed and noted the following in the patient's chart:   Medical and social history Use of alcohol, tobacco or illicit drugs  Current medications and supplements including opioid prescriptions. Patient is not currently taking opioid prescriptions. Functional ability and status Nutritional status Physical activity Advanced directives List of other physicians Hospitalizations, surgeries, and ER visits in previous 12 months Vitals Screenings to include cognitive, depression, and falls Referrals and appointments  In addition, I have reviewed and  discussed with patient certain preventive protocols, quality metrics, and best practice recommendations. A written personalized care plan for preventive services as well as general preventive health recommendations were provided to patient.     Mickeal Needy, LPN   1/0/2725   After Visit Summary: (Mail) Due to this being a telephonic visit, the after visit summary with patients personalized plan was offered to patient via mail   Nurse Notes: Normal cognitive status assessed by direct observation via telephone conversation by this Nurse Health Advisor.  No abnormalities found.   Medical screening examination/treatment/procedure(s) were performed by non-physician practitioner and as supervising physician I was immediately available for consultation/collaboration.  I agree with above. Jacinta Shoe, MD

## 2022-12-29 NOTE — Telephone Encounter (Signed)
Spoke with patient and completed AWV via phone call.  Daughter assisted patient with visit for today.  Daughter took bp and got the following readings: 215/105 and 219/103 with pulse of 77.  During the tele visit, patient daughter stated that the patient is always home alone, barely eating and has fallen multiple times.  Patient was recently discharged from [facility] for HTN.  I advised daughter to contact EMS just to be evaluated for HTN so high. Patient does have a Life Alert necklace that she does not wear according to patient.  I advised patient to please wear life alert for her safety, since she has a gait d/o and multiple falls.

## 2022-12-29 NOTE — ED Triage Notes (Signed)
Patient is coming from home BIB EMS. Patient daughter came over this afternoon and took blood pressure and was noted to be 200 over something. Daughter called her primary care office and was told to call 911. When EMS arrived on scene daughter states that patient was seen with [place] for blood pressure on June 27th and was discharged and was told to follow up with primary care doctor in 1 week. Patient states she is not having a headache or any weakness. Negative stroke screen noted.

## 2022-12-29 NOTE — Patient Instructions (Addendum)
Paula Griffith , Thank you for taking time to come for your Medicare Wellness Visit. I appreciate your ongoing commitment to your health goals. Please review the following plan we discussed and let me know if I can assist you in the future.   These are the goals we discussed:  Goals      Client understands the importance of follow-up with providers by attending scheduled visits.     continue to improve post hospitalization     Interventions Today    Flowsheet Row Most Recent Value  Chronic Disease   Chronic disease during today's visit Hypertension (HTN)  General Interventions   General Interventions Discussed/Reviewed General Interventions Reviewed, Doctor Visits, Communication with  Doctor Visits Discussed/Reviewed Doctor Visits Discussed, Doctor Visits Reviewed, PCP  PCP/Specialist Visits Compliance with follow-up visit  Communication with Pharmacists  Education Interventions   Education Provided Provided Education  Provided Verbal Education On Medication, Other  [advised patient to obtain a copy of her after visit summary from PCP office visit 12/17/22. rienforced can access in my chart and obtain from provider office]  Pharmacy Interventions   Pharmacy Dicussed/Reviewed Referral to Pharmacist, Pharmacy Topics Reviewed, Pharmacy Topics Discussed, Medication Adherence  [medication discrepancy between patient, medication list and PCP note]  Medication Adherence Not taking medication  [patient reports she is not taking amlodipine or losartan]              This is a list of the screening recommended for you and due dates:  Health Maintenance  Topic Date Due   Hepatitis C Screening  Never done   DEXA scan (bone density measurement)  Never done   COVID-19 Vaccine (3 - Moderna risk series) 05/18/2020   Medicare Annual Wellness Visit  12/29/2023   DTaP/Tdap/Td vaccine (2 - Td or Tdap) 04/20/2026   Pneumonia Vaccine  Completed   HPV Vaccine  Aged Out   Flu Shot  Discontinued   Colon  Cancer Screening  Discontinued   Zoster (Shingles) Vaccine  Discontinued    Advanced directives: No  Conditions/risks identified: Yes; Falls  Next appointment: It was nice speaking with you today!  Please follow up in one year for your annual wellness visit via telephone call with Nurse Percell Miller on 01/03/2024 at 3:00 p.m.  If you need to cancel or reschedule please call [phone].   Preventive Care 77 Years and Older, Female Preventive care refers to lifestyle choices and visits with your health care provider that can promote health and wellness. What does preventive care include? A yearly physical exam. This is also called an annual well check. Dental exams once or twice a year. Routine eye exams. Ask your health care provider how often you should have your eyes checked. Personal lifestyle choices, including: Daily care of your teeth and gums. Regular physical activity. Eating a healthy diet. Avoiding tobacco and drug use. Limiting alcohol use. Practicing safe sex. Taking low-dose aspirin every day. Taking vitamin and mineral supplements as recommended by your health care provider. What happens during an annual well check? The services and screenings done by your health care provider during your annual well check will depend on your age, overall health, lifestyle risk factors, and family history of disease. Counseling  Your health care provider may ask you questions about your: Alcohol use. Tobacco use. Drug use. Emotional well-being. Home and relationship well-being. Sexual activity. Eating habits. History of falls. Memory and ability to understand (cognition). Work and work Astronomer. Reproductive health. Screening  You may have the following  tests or measurements: Height, weight, and BMI. Blood pressure. Lipid and cholesterol levels. These may be checked every 5 years, or more frequently if you are over 68 years old. Skin check. Lung cancer screening. You may  have this screening every year starting at age 58 if you have a 30-pack-year history of smoking and currently smoke or have quit within the past 15 years. Fecal occult blood test (FOBT) of the stool. You may have this test every year starting at age 78. Flexible sigmoidoscopy or colonoscopy. You may have a sigmoidoscopy every 5 years or a colonoscopy every 10 years starting at age 56. Hepatitis C blood test. Hepatitis B blood test. Sexually transmitted disease (STD) testing. Diabetes screening. This is done by checking your blood sugar (glucose) after you have not eaten for a while (fasting). You may have this done every 1-3 years. Bone density scan. This is done to screen for osteoporosis. You may have this done starting at age 65. Mammogram. This may be done every 1-2 years. Talk to your health care provider about how often you should have regular mammograms. Talk with your health care provider about your test results, treatment options, and if necessary, the need for more tests. Vaccines  Your health care provider may recommend certain vaccines, such as: Influenza vaccine. This is recommended every year. Tetanus, diphtheria, and acellular pertussis (Tdap, Td) vaccine. You may need a Td booster every 10 years. Zoster vaccine. You may need this after age 87. Pneumococcal 13-valent conjugate (PCV13) vaccine. One dose is recommended after age 50. Pneumococcal polysaccharide (PPSV23) vaccine. One dose is recommended after age 50. Talk to your health care provider about which screenings and vaccines you need and how often you need them. This information is not intended to replace advice given to you by your health care provider. Make sure you discuss any questions you have with your health care provider. Document Released: 07/12/2015 Document Revised: 03/04/2016 Document Reviewed: 04/16/2015 Elsevier Interactive Patient Education  2017 [employer].  Fall Prevention in the Home Falls can cause  injuries. They can happen to people of all ages. There are many things you can do to make your home safe and to help prevent falls. What can I do on the outside of my home? Regularly fix the edges of walkways and driveways and fix any cracks. Remove anything that might make you trip as you walk through a door, such as a raised step or threshold. Trim any bushes or trees on the path to your home. Use bright outdoor lighting. Clear any walking paths of anything that might make someone trip, such as rocks or tools. Regularly check to see if handrails are loose or broken. Make sure that both sides of any steps have handrails. Any raised decks and porches should have guardrails on the edges. Have any leaves, snow, or ice cleared regularly. Use sand or salt on walking paths during winter. Clean up any spills in your garage right away. This includes oil or grease spills. What can I do in the bathroom? Use night lights. Install grab bars by the toilet and in the tub and shower. Do not use towel bars as grab bars. Use non-skid mats or decals in the tub or shower. If you need to sit down in the shower, use a plastic, non-slip stool. Keep the floor dry. Clean up any water that spills on the floor as soon as it happens. Remove soap buildup in the tub or shower regularly. Attach bath mats securely with  double-sided non-slip rug tape. Do not have throw rugs and other things on the floor that can make you trip. What can I do in the bedroom? Use night lights. Make sure that you have a light by your bed that is easy to reach. Do not use any sheets or blankets that are too big for your bed. They should not hang down onto the floor. Have a firm chair that has side arms. You can use this for support while you get dressed. Do not have throw rugs and other things on the floor that can make you trip. What can I do in the kitchen? Clean up any spills right away. Avoid walking on wet floors. Keep items that you  use a lot in easy-to-reach places. If you need to reach something above you, use a strong step stool that has a grab bar. Keep electrical cords out of the way. Do not use floor polish or wax that makes floors slippery. If you must use wax, use non-skid floor wax. Do not have throw rugs and other things on the floor that can make you trip. What can I do with my stairs? Do not leave any items on the stairs. Make sure that there are handrails on both sides of the stairs and use them. Fix handrails that are broken or loose. Make sure that handrails are as long as the stairways. Check any carpeting to make sure that it is firmly attached to the stairs. Fix any carpet that is loose or worn. Avoid having throw rugs at the top or bottom of the stairs. If you do have throw rugs, attach them to the floor with carpet tape. Make sure that you have a light switch at the top of the stairs and the bottom of the stairs. If you do not have them, ask someone to add them for you. What else can I do to help prevent falls? Wear shoes that: Do not have high heels. Have rubber bottoms. Are comfortable and fit you well. Are closed at the toe. Do not wear sandals. If you use a stepladder: Make sure that it is fully opened. Do not climb a closed stepladder. Make sure that both sides of the stepladder are locked into place. Ask someone to hold it for you, if possible. Clearly mark and make sure that you can see: Any grab bars or handrails. First and last steps. Where the edge of each step is. Use tools that help you move around (mobility aids) if they are needed. These include: Canes. Walkers. Scooters. Crutches. Turn on the lights when you go into a dark area. Replace any light bulbs as soon as they burn out. Set up your furniture so you have a clear path. Avoid moving your furniture around. If any of your floors are uneven, fix them. If there are any pets around you, be aware of where they are. Review your  medicines with your doctor. Some medicines can make you feel dizzy. This can increase your chance of falling. Ask your doctor what other things that you can do to help prevent falls. This information is not intended to replace advice given to you by your health care provider. Make sure you discuss any questions you have with your health care provider. Document Released: 04/11/2009 Document Revised: 11/21/2015 Document Reviewed: 07/20/2014 Elsevier Interactive Patient Education  2017 [employer].

## 2022-12-29 NOTE — Discharge Instructions (Signed)
We evaluated you for your elevated blood pressures.  Your blood pressure was elevated in the emergency department but we did not see any signs of heart dysfunction or abnormal laboratory test.  You had no symptoms of high blood pressure in the emergency department.  We discussed home health but you did not want to have help with this today.  Please discuss this with Dr. Posey Rea if you would like to have home health.  Your blood pressure still quite high, and if you develop any symptoms of high blood pressure we would want to see you in the emergency department right away.  Symptoms would include headaches, vision changes, nausea or vomiting, chest pain, difficulty breathing, leg swelling, abdominal pain, confusion, or any other concerning symptoms.

## 2022-12-29 NOTE — ED Provider Notes (Signed)
[place] EMERGENCY DEPARTMENT AT [facility] Provider Note  CSN: [id number] Arrival date & time: 12/29/22 1630  Chief Complaint(s) Hypertension  HPI Paula Griffith is a 77 y.o. female history of hypertension presenting to the emergency department with elevated blood pressure.  Patient reports that her blood pressure was elevated this morning.  She did not take her medication earlier in the morning.  Her daughter came over and took her blood pressure and it was very elevated, over 200 systolic.  Her primary care office advised her to come get checked out in the emergency department and the paramedics were called.  She was recently admitted for hypertensive emergency and this was thought to be due to medication compliance.  Patient reports currently she feels at her baseline and she denies any symptoms at all including no headaches, vision changes, chest pain, shortness of breath, lightheadedness or dizziness, confusion, neck pain, back pain, abdominal pain, leg swelling.   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   Spondylolisthesis of lumbar region    Thrombocytopathy [facility]) 2011   Dr Arline Asp   Patient Active Problem List   Diagnosis Date Noted   Open wound of skin 09/02/2022   Weakness of left side of body 09/01/2022   Hypertensive urgency 09/01/2022   Fall 05/06/2022   Contusion of hand 05/06/2022   Pain and swelling of right lower leg 04/02/2022   Witnessed episode of apnea 03/08/2022   Tinea corporis 02/03/2022   Cellulitis of umbilicus  02/03/2022   Sleep-disordered breathing 02/01/2022   Malnutrition of moderate degree 01/16/2022   Nausea vomiting and diarrhea 01/14/2022   Dehydration 01/14/2022   Confusion 01/14/2022   Acute on chronic hyponatremia 01/14/2022   Hypokalemia 01/14/2022   Bacteriuria 01/14/2022   AKI (acute kidney injury) (HCC) 01/14/2022   Skin cyst 07/14/2021   Vision changes 07/14/2021   Grief 07/14/2021   Influenza vaccine side effect, initial encounter 03/31/2021   UTI (urinary tract infection) 03/31/2021   Tinnitus aurium, left 10/23/2019   Hearing loss 10/23/2019   S/P lumbar spinal fusion 01/06/2018   Preop exam for internal medicine 11/16/2017   Cervical vertebral fusion 09/22/2017   Gait disorder 07/06/2017   Stress at home 08/24/2016   Well adult exam 04/20/2016   Anemia 12/24/2015   Left sciatic nerve pain 09/24/2014   MPN (myeloproliferative neoplasm) (HCC) 06/12/2014   Tick bite, infected 11/15/2013   Ankle pain, right 07/19/2013   Hip pain, chronic, left 09/10/2012   Dermatitis 10/07/2011   Hyperglycemia 10/07/2011   Occlusion and stenosis of carotid artery without mention of cerebral infarction 08/05/2011   Arthritis pain, wrist 10/06/2010   VERTIGO 12/20/2009   SKIN RASH 12/20/2009   Retinal ischemia 08/21/2009   PERIPHERAL VASCULAR DISEASE 08/21/2009   Edema 08/21/2009   Disease of blood and blood forming organ 06/07/2009   VISUAL IMPAIRMENT 06/07/2009   Allergic rhinitis 08/09/2007   SPRAIN&STRAIN OF UNSPECIFIED SITE OF KNEE&LEG 08/09/2007   INSOMNIA-SLEEP DISORDER-UNSPEC  04/06/2007   HTN (hypertension), malignant 04/06/2007   Asthma 04/06/2007   DIVERTICULOSIS, COLON 04/06/2007   Osteoarthritis 04/06/2007   Lumbago 04/06/2007   History of colonic polyps 04/06/2007   Home Medication(s) Prior to Admission medications   Medication Sig Start Date End Date Taking? Authorizing Provider  acetaminophen (TYLENOL) 325 MG tablet Take 2 tablets (650 mg total) by mouth  every 6 (six) hours as needed for mild pain (or Fever >/= 101). 09/05/22   Sheikh, Omair Latif, DO  albuterol (PROAIR HFA) 108 (90 Base) MCG/ACT inhaler Inhale 2 puffs into the lungs every 4 (four) hours as needed. For shortness of breath Patient taking differently: Inhale 2 puffs into the lungs every 4 (four) hours as needed for shortness of breath. 08/07/19   [name], Georgina Quint, MD  anagrelide (AGRYLIN) 1 MG capsule Take 1 capsule (1 mg total) by mouth See admin instructions. TAKE 1 CAPSULE BY MOUTH DAILY EXCEPT 2 DAYS A WEEK (TUESDAYS AND THURSDAYS) DO NOT TAKE. Patient taking differently: Take 1 mg by mouth See admin instructions. TAKE 1 CAPSULE BY MOUTH DAILY EXCEPT 2 DAYS A WEEK (TUESDAYS AND THURSDAYS ONLY PER DAUGHTER 12/17/22   [name], Georgina Quint, MD  Ascorbic Acid (VITAMIN C) 1000 MG tablet Take 1,000 mg by mouth daily.    [provider]  aspirin 325 MG EC tablet Take 325 mg by mouth daily after breakfast.    [provider]  carvedilol (COREG) 25 MG tablet Take 1 tablet (25 mg total) by mouth 2 (two) times daily. 12/26/22   Burnadette Pop, MD  Cholecalciferol (VITAMIN D3) 2000 units TABS Take 2,000 Units by mouth daily.    [provider]  Fluticasone-Salmeterol (ADVAIR) 250-50 MCG/DOSE AEPB Please specify directions, refills and quantity Patient taking differently: Inhale 1 puff into the lungs daily. 08/07/19   [name], Georgina Quint, MD  gabapentin (NEURONTIN) 100 MG capsule TAKE 1 CAPSULE BY MOUTH THREE TIMES A DAY Patient taking differently: Take 100 mg by mouth 3 (three) times daily. 07/29/22   [name], Georgina Quint, MD  hydrALAZINE (APRESOLINE) 25 MG tablet Take 3 tablets (75 mg total) by mouth every 8 (eight) hours. 12/26/22   Burnadette Pop, MD  isosorbide mononitrate (IMDUR) 60 MG 24 hr tablet Take 1 tablet (60 mg total) by mouth daily. 12/27/22   Burnadette Pop, MD  losartan (COZAAR) 100 MG tablet Take 1 tablet (100 mg total) by mouth daily. 12/26/22    Burnadette Pop, MD  Multiple Vitamins-Minerals (ADULT GUMMY PO) Take 2 tablets by mouth daily.    [provider]  ondansetron (ZOFRAN) 4 MG tablet Take 1 tablet (4 mg total) by mouth every 6 (six) hours as needed for nausea. 09/05/22   Marguerita Merles Latif, DO  potassium chloride SA (KLOR-CON M) 20 MEQ tablet Take 2 tablets (40 mEq total) by mouth daily for 3 days. 12/27/22 12/30/22  Burnadette Pop, MD  silver sulfADIAZINE (SILVADENE) 1 % cream Apply topically daily. Patient taking differently: Apply 1 Application topically daily. 09/05/22   Marguerita Merles Latif, DO  trolamine salicylate (ASPERCREME) 10 % cream Apply 1 application topically 2 (two) times daily as needed for muscle pain.    [provider]  losartan-hydrochlorothiazide (HYZAAR) 100-25 MG tablet Take 1 tablet by mouth daily. 04/03/19 06/28/19  [name], Georgina Quint, MD  Past Surgical History Past Surgical History:  Procedure Laterality Date   ABDOMINAL HYSTERECTOMY  1989   ANTERIOR CERVICAL DECOMP/DISCECTOMY FUSION N/A 09/22/2017   Procedure: ANTERIOR CERVICAL DECOMPRESSION AND FUSION CERVICAL THREE-FOUR.;  Surgeon: Tia Alert, MD;  Location: [facility] OR;  Service: Neurosurgery;  Laterality: N/A;  anterior   BACK SURGERY     BREAST EXCISIONAL BIOPSY Left    BREAST SURGERY     Left   LUMBAR LAMINECTOMY/DECOMPRESSION MICRODISCECTOMY Left 01/06/2018   Procedure: laminectomy Lumbar five -Sacral one - left, Lumbar two-Lumbar three - bilateral;  Surgeon: Tia Alert, MD;  Location: [facility] OR;  Service: Neurosurgery;  Laterality: Left;   Family History Family History  Problem Relation Age of Onset   Pancreatic cancer Mother    Arthritis Mother        RA   Early death Father 56       MI   Coronary artery disease Other        Female 1st degree relative Female <60   Lung cancer Other    Diabetes  Sister    Breast cancer Maternal Grandmother    Colon cancer Neg Hx    Stomach cancer Neg Hx     Social History Social History   Tobacco Use   Smoking status: Never    Passive exposure: Past   Smokeless tobacco: Never  Vaping Use   Vaping Use: Never used  Substance Use Topics   Alcohol use: No    Alcohol/week: 0.0 standard drinks of alcohol   Drug use: No   Allergies Thiazide-type diuretics, Benazepril, Amlodipine, Fluad [influenza vac a&b surf ant adj], Other, Codeine, and Tramadol  Review of Systems Review of Systems  All other systems reviewed and are negative.   Physical Exam Vital Signs  I have reviewed the triage vital signs BP (!) 205/85   Pulse 66   Temp 98.6 F (37 C) (Oral)   Resp 15   Ht 5\' 3"  (1.6 m)   Wt 58.1 kg   SpO2 100%   BMI 22.69 kg/m  Physical Exam Vitals and nursing note reviewed.  Constitutional:      General: She is not in acute distress.    Appearance: She is well-developed.  HENT:     Head: Normocephalic and atraumatic.     Mouth/Throat:     Mouth: Mucous membranes are moist.  Eyes:     Pupils: Pupils are equal, round, and reactive to light.  Cardiovascular:     Rate and Rhythm: Normal rate and regular rhythm.     Heart sounds: No murmur heard. Pulmonary:     Effort: Pulmonary effort is normal. No respiratory distress.     Breath sounds: Normal breath sounds.  Abdominal:     General: Abdomen is flat.     Palpations: Abdomen is soft.     Tenderness: There is no abdominal tenderness.  Musculoskeletal:        General: No tenderness.     Right lower leg: No edema.     Left lower leg: No edema.  Skin:    General: Skin is warm and dry.  Neurological:     General: No focal deficit present.     Mental Status: She is alert and oriented to person, place, and time. Mental status is at baseline.     Comments: Cranial nerves II through XII intact, strength 5 out of 5 in the bilateral upper and lower extremities, no sensory deficit to  light touch, no dysmetria on  finger-nose-finger testing, ambulatory with steady gait.  Psychiatric:        Mood and Affect: Mood normal.        Behavior: Behavior normal.     ED Results and Treatments Labs (all labs ordered are listed, but only abnormal results are displayed) Labs Reviewed  COMPREHENSIVE METABOLIC PANEL - Abnormal; Notable for the following components:      Result Value   Sodium 130 (*)    CO2 21 (*)    Glucose, Bld 123 (*)    Albumin 3.4 (*)    All other components within normal limits  CBC WITH DIFFERENTIAL/PLATELET - Abnormal; Notable for the following components:   RBC 2.69 (*)    Hemoglobin 8.4 (*)    HCT 25.4 (*)    All other components within normal limits  TROPONIN I (HIGH SENSITIVITY)  TROPONIN I (HIGH SENSITIVITY)                                                                                                                          Radiology DG Chest Port 1 View  Result Date: 12/29/2022 CLINICAL DATA:  Chest pain, hypertension EXAM: PORTABLE CHEST 1 VIEW COMPARISON:  12/25/2022 FINDINGS: The heart size and mediastinal contours are within normal limits. Both lungs are clear. The visualized skeletal structures are unremarkable. IMPRESSION: Negative. Electronically Signed   By: Charlett Nose M.D.   On: 12/29/2022 17:45    Pertinent labs & imaging results that were available during my care of the patient were reviewed by me and considered in my medical decision making (see MDM for details).  Medications Ordered in ED Medications  labetalol (NORMODYNE) injection 10 mg (10 mg Intravenous Given 12/29/22 1804)  carvedilol (COREG) tablet 25 mg (25 mg Oral Given 12/29/22 1943)  hydrALAZINE (APRESOLINE) tablet 25 mg (25 mg Oral Given 12/29/22 1943)                                                                                                                                     Procedures .Critical Care  Performed by: Lonell Grandchild, MD Authorized by:  Lonell Grandchild, MD   Critical care provider statement:    Critical care time (minutes):  30   Critical care was necessary to treat or prevent imminent or life-threatening deterioration of the following conditions:  Cardiac failure   Critical care was time spent personally by  me on the following activities:  Development of treatment plan with patient or surrogate, discussions with consultants, evaluation of patient's response to treatment, examination of patient, ordering and review of laboratory studies, ordering and review of radiographic studies, ordering and performing treatments and interventions, pulse oximetry, re-evaluation of patient's condition and review of old charts   (including critical care time)  Medical Decision Making / ED Course   MDM:  77 year old female presenting to the emergency department hypertension.  Patient well-appearing, physical exam is reassuring including normal neurologic exam.  She denies any focal symptoms.  Laboratory testing was obtained to evaluate for signs of endorgan dysfunction such as cardiac dysfunction, her troponin is normal, creatinine is normal.  Patient essentially has asymptomatic hypertension.  Blood pressure was quite high up to 240 and patient received medications for this.  I also gave her dose of her home medication with improvement in her blood pressure.  She had negative troponin x 2.    I discussed also with the patient's daughter on the phone.  She requested that we talked the patient about whether home health would be beneficial.  The patient refused home health.  She reports she did not take her blood pressure medications today until the ambulance was called.  I advised her that she should take her medication as prescribed or she is at risk for dangerous complications of high blood pressure.  Patient is aware and understands.  Ultimately discussed observation for further blood pressure control versus discharge, patient request  discharge and wants to go home.  Since she is asymptomatic I think this is reasonable and she understands she should return should she develop any symptoms that would be concerning for signs of endorgan dysfunction  Will discharge patient to home. All questions answered. Patient comfortable with plan of discharge. Return precautions discussed with patient and specified on the after visit summary.   Clinical Course as of 12/29/22 2140  Tue Dec 29, 2022  1910 BMI (Calculated): 22.7 [WS]    Clinical Course User Index [WS] Lonell Grandchild, MD     Additional history obtained: -Additional history obtained from family and ems -External records from outside source obtained and reviewed including: Chart review including previous notes, labs, imaging, consultation notes including prior admission for HTN   Lab Tests: -I ordered, reviewed, and interpreted labs.   The pertinent results include:   Labs Reviewed  COMPREHENSIVE METABOLIC PANEL - Abnormal; Notable for the following components:      Result Value   Sodium 130 (*)    CO2 21 (*)    Glucose, Bld 123 (*)    Albumin 3.4 (*)    All other components within normal limits  CBC WITH DIFFERENTIAL/PLATELET - Abnormal; Notable for the following components:   RBC 2.69 (*)    Hemoglobin 8.4 (*)    HCT 25.4 (*)    All other components within normal limits  TROPONIN I (HIGH SENSITIVITY)  TROPONIN I (HIGH SENSITIVITY)    Notable for chronic anemia, normal cr and troponin  EKG   EKG Interpretation Date/Time:  Tuesday December 29 2022 16:38:47 EDT Ventricular Rate:  72 PR Interval:  188 QRS Duration:  75 QT Interval:  368 QTC Calculation: 403 R Axis:   29  Text Interpretation: Sinus rhythm Confirmed by Alvino Blood ([id number]) on 12/29/2022 5:40:31 PM         Imaging Studies ordered: I ordered imaging studies including CXR On my interpretation imaging demonstrates no acute process I independently visualized and  interpreted  imaging. I agree with the radiologist interpretation   Medicines ordered and prescription drug management: Meds ordered this encounter  Medications   labetalol (NORMODYNE) injection 10 mg   carvedilol (COREG) tablet 25 mg   hydrALAZINE (APRESOLINE) tablet 25 mg   DISCONTD: losartan (COZAAR) tablet 100 mg    -I have reviewed the patients home medicines and have made adjustments as needed   Cardiac Monitoring: The patient was maintained on a cardiac monitor.  I personally viewed and interpreted the cardiac monitored which showed an underlying rhythm of: NSR  Social Determinants of Health:  Diagnosis or treatment significantly limited by social determinants of health: medication non compliance   Reevaluation: After the interventions noted above, I reevaluated the patient and found that their symptoms have improved  Co morbidities that complicate the patient evaluation  Past Medical History:  Diagnosis Date   Allergic rhinitis    Asthma    Cervicalgia    Colon polyps    Complication of anesthesia    Diverticulosis of colon    Glucose intolerance (impaired glucose tolerance)    HTN (hypertension)    LBP (low back pain)    Leukemia (HCC)    Osteoarthritis    PONV (postoperative nausea and vomiting)    PVD (peripheral vascular disease) (HCC) 2011   Right Carotid Dr Edilia Bo   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    Final Clinical Impression(s) / ED Diagnoses Final diagnoses:  Asymptomatic hypertension     This chart was dictated using voice recognition software.  Despite best efforts to proofread,  errors can occur which can change the documentation meaning.    Lonell Grandchild, MD 12/29/22 2140

## 2023-01-01 NOTE — Progress Notes (Signed)
   Care Guide Note  01/01/2023 Name: [name] MRN: [id number] DOB: [birth year]  Referred by: Tresa Garter, MD Reason for referral : Care Coordination (Outreach to schedule referral with Pharm d )   Paula Griffith is a 77 y.o. year old female who is a primary care patient of [name], Georgina Quint, MD. [name] was referred to the pharmacist for assistance related to HTN.    Successful contact was made with the patient to discuss pharmacy services including being ready for the pharmacist to call at least 5 minutes before the scheduled appointment time, to have medication bottles and any blood sugar or blood pressure readings ready for review. The patient agreed to meet with the pharmacist via with the pharmacist via telephone visit on (date/time).  01/14/2023  [name], RMA Care Guide [facility]  [place], [place] [address] Direct Dial: [phone] [email or web address]

## 2023-01-03 NOTE — Telephone Encounter (Signed)
Paula Griffith, I am not sure who you talked to. Her daughter ,[name], would be her POA for healthcare 2024.  The patient does not want other family members to be involved in her healthcare.  Thank you

## 2023-01-04 ENCOUNTER — Telehealth: Payer: Self-pay

## 2023-01-04 NOTE — Telephone Encounter (Signed)
Transition Care Management Unsuccessful Follow-up Telephone Call  Date of discharge and from where:  12/29/2022 [facility]  Attempts:  1st Attempt  Reason for unsuccessful TCM follow-up call:  Left voice message  Kesley Gaffey [place]  [facility] Population Health Community Resource Care Guide   [email or web address]  ?? [phone]   Website: [email or web address]  [email or web address]      

## 2023-01-05 ENCOUNTER — Telehealth: Payer: Self-pay

## 2023-01-05 NOTE — Telephone Encounter (Signed)
Transition Care Management Unsuccessful Follow-up Telephone Call  Date of discharge and from where:  12/29/2022 [facility]  Attempts:  2nd Attempt  Reason for unsuccessful TCM follow-up call:  Left voice message  Sarena Jezek [facility]  [facility] Population Health Community Resource Care Guide   [email or web address]  ?? [phone]   Website: [email or web address]

## 2023-01-07 ENCOUNTER — Telehealth: Payer: Self-pay

## 2023-01-07 NOTE — Patient Outreach (Signed)
  Care Coordination   01/07/2023 Name: DUAA STELZNER MRN: [id number] DOB: 1945-09-19   Care Coordination Outreach Attempts:  An unsuccessful telephone outreach was attempted for a scheduled appointment today.  Follow Up Plan:  Additional outreach attempts will be made to offer the patient care coordination information and services.   Encounter Outcome:  No Answer   Care Coordination Interventions:  No, not indicated    Kathyrn Sheriff, RN, MSN, BSN, CCM [facility] Care Coordinator [phone]

## 2023-01-14 ENCOUNTER — Other Ambulatory Visit: Payer: Medicare PPO

## 2023-01-14 NOTE — Progress Notes (Signed)
   01/14/2023  Patient ID: Paula Griffith, female   DOB: [birth year], 78 y.o.   MRN: [id number]  Outreach attempt for scheduled telephone call to review medications unsuccessful.  Phone number is not working/invalid; tried to call numerous times with no luck.  Will ask department scheduler to try to reach patient in a few days to try to reschedule if she can get through.  Lenna Gilford, PharmD, DPLA

## 2023-01-20 ENCOUNTER — Other Ambulatory Visit: Payer: Self-pay

## 2023-01-20 DIAGNOSIS — D649 Anemia, unspecified: Secondary | ICD-10-CM

## 2023-01-20 DIAGNOSIS — D471 Chronic myeloproliferative disease: Secondary | ICD-10-CM

## 2023-01-21 ENCOUNTER — Inpatient Hospital Stay: Payer: Medicare PPO | Attending: Hematology

## 2023-01-21 DIAGNOSIS — D471 Chronic myeloproliferative disease: Secondary | ICD-10-CM | POA: Diagnosis not present

## 2023-01-21 DIAGNOSIS — D649 Anemia, unspecified: Secondary | ICD-10-CM

## 2023-01-21 LAB — CBC WITH DIFFERENTIAL (CANCER CENTER ONLY)
Abs Immature Granulocytes: 0 10*3/uL (ref 0.00–0.07)
Basophils Absolute: 0 10*3/uL (ref 0.0–0.1)
Basophils Relative: 0 %
Eosinophils Absolute: 0 10*3/uL (ref 0.0–0.5)
Eosinophils Relative: 1 %
HCT: 24.1 % — ABNORMAL LOW (ref 36.0–46.0)
Hemoglobin: 8.5 g/dL — ABNORMAL LOW (ref 12.0–15.0)
Lymphocytes Relative: 24 %
Lymphs Abs: 1 10*3/uL (ref 0.7–4.0)
MCH: 32 pg (ref 26.0–34.0)
MCHC: 35.3 g/dL (ref 30.0–36.0)
MCV: 90.6 fL (ref 80.0–100.0)
Monocytes Absolute: 0.3 10*3/uL (ref 0.1–1.0)
Monocytes Relative: 7 %
Neutro Abs: 2.9 10*3/uL (ref 1.7–7.7)
Neutrophils Relative %: 68 %
Platelet Count: 154 10*3/uL (ref 150–400)
RBC: 2.66 MIL/uL — ABNORMAL LOW (ref 3.87–5.11)
RDW: 12.2 % (ref 11.5–15.5)
WBC Count: 4.3 10*3/uL (ref 4.0–10.5)
nRBC: 0 % (ref 0.0–0.2)

## 2023-01-21 LAB — CMP (CANCER CENTER ONLY)
ALT: <5 U/L (ref 0–44)
AST: 15 U/L (ref 15–41)
Albumin: 3.6 g/dL (ref 3.5–5.0)
Alkaline Phosphatase: 52 U/L (ref 38–126)
Anion gap: 8 (ref 5–15)
BUN: 8 mg/dL (ref 8–23)
CO2: 27 mmol/L (ref 22–32)
Calcium: 9.1 mg/dL (ref 8.9–10.3)
Chloride: 97 mmol/L — ABNORMAL LOW (ref 98–111)
Creatinine: 0.87 mg/dL (ref 0.44–1.00)
GFR, Estimated: >60 mL/min (ref >60)
Glucose, Bld: 101 mg/dL — ABNORMAL HIGH (ref 70–99)
Potassium: 3.5 mmol/L (ref 3.5–5.1)
Sodium: 132 mmol/L — ABNORMAL LOW (ref 135–145)
Total Bilirubin: 0.4 mg/dL (ref 0.3–1.2)
Total Protein: 6.3 g/dL — ABNORMAL LOW (ref 6.5–8.1)

## 2023-01-21 LAB — IRON AND IRON BINDING CAPACITY (CC-WL,HP ONLY)
Iron: 16 ug/dL — ABNORMAL LOW (ref 28–170)
Saturation Ratios: 6 % — ABNORMAL LOW (ref 10.4–31.8)
TIBC: 253 ug/dL (ref 250–450)
UIBC: 237 ug/dL (ref 148–442)

## 2023-01-26 ENCOUNTER — Telehealth: Payer: Self-pay

## 2023-01-26 NOTE — Progress Notes (Signed)
   Care Guide Note  01/26/2023 Name: Paula Griffith MRN: [id number] DOB: [birth year]  Referred by: Tresa Garter, MD Reason for referral : Care Coordination (Outreach to reschedule with Pharm d )   Paula Griffith is a 77 y.o. year old female who is a primary care patient of [name], Georgina Quint, MD. Paula Griffith was referred to the pharmacist for assistance related to HTN.    An unsuccessful telephone outreach was attempted today to contact the patient who was referred to the pharmacy team for assistance with medication management. Additional attempts will be made to contact the patient.   [name], RMA Care Guide [facility]  [place], [place] [address] Direct Dial: [phone] [email or web address]

## 2023-02-08 ENCOUNTER — Telehealth: Payer: Self-pay | Admitting: Internal Medicine

## 2023-02-08 NOTE — Telephone Encounter (Signed)
Patient's daughter would like an order done for a shower chair for her mom.  Please call Melissa at [phone]

## 2023-02-10 NOTE — Telephone Encounter (Signed)
Her daughter ,[name], would be her POA for healthcare; 2024.  The patient does not want other family members to be involved in her healthcare.  Thanks

## 2023-02-12 ENCOUNTER — Telehealth: Payer: Self-pay | Admitting: Internal Medicine

## 2023-02-12 NOTE — Telephone Encounter (Signed)
Pt daughter called about her mother having real bad dementia episodes and mentioned is there any way she can get some type of help with her mom because everythging is on her and she's on thin ice with her job, to the point of termination. She has siblings to help but she's the only one actually doing for her mother and she's the youngest with her own livelihood to tend to as well. She mentioned that she would love for her mothers nurse to give her a call back with help or advice to push her in the right direction for home health.  Lakiera Thornbrugh (daughter) [phone]

## 2023-02-15 NOTE — Telephone Encounter (Signed)
Noted. Ms Loven instructed me to communicate only with her daughter Paula Griffith, who would be her POA for healthcare; 2024.  The patient does not want other family members to be involved in her healthcare.  Thank you

## 2023-02-25 NOTE — Progress Notes (Signed)
   Care Guide Note  02/25/2023 Name: Paula Griffith MRN: [id number] DOB: June 01, 1946  Referred by: Tresa Garter, MD Reason for referral : Care Coordination (Outreach to reschedule with Pharm d )   Paula Griffith is a 77 y.o. year old female who is a primary care patient of [name], Georgina Quint, MD. [name] was referred to the pharmacist for assistance related to HTN.    Successful contact was made with the patient to discuss pharmacy services including being ready for the pharmacist to call at least 5 minutes before the scheduled appointment time, to have medication bottles and any blood sugar or blood pressure readings ready for review. The patient agreed to meet with the pharmacist via with the pharmacist via telephone visit on (date/time).   03/18/2023 [name], RMA Care Guide [facility]  [place], [place] [address] Direct Dial: [phone] [email or web address]

## 2023-03-02 ENCOUNTER — Telehealth: Payer: Self-pay | Admitting: Internal Medicine

## 2023-03-02 NOTE — Telephone Encounter (Signed)
Prescription Request  03/02/2023  LOV: 12/17/2022  What is the name of the medication or equipment? gabapentin (NEURONTIN) 100 MG capsule   Have you contacted your pharmacy to request a refill? No   Which pharmacy would you like this sent to?  [id number] [facility], [place] - [address] [place] [address] Phone: [phone] Fax: [phone]    Patient notified that their request is being sent to the clinical staff for review and that they should receive a response within 2 business days.   Please advise at Mobile [phone] (mobile)

## 2023-03-04 MED ORDER — GABAPENTIN 100 MG PO CAPS: 100.0000 mg | ORAL_CAPSULE | Freq: Three times a day (TID) | ORAL | 1 refills | Status: DC | PRN

## 2023-03-04 NOTE — Telephone Encounter (Signed)
Ok Thx 

## 2023-03-17 ENCOUNTER — Other Ambulatory Visit: Payer: Self-pay

## 2023-03-18 ENCOUNTER — Other Ambulatory Visit: Payer: Medicare PPO | Admitting: Pharmacist

## 2023-03-18 NOTE — Progress Notes (Cosign Needed Addendum)
03/18/2023 Name: Paula Griffith MRN: [id number] DOB: 09/02/1945  No chief complaint on file.   Paula Griffith is a 77 y.o. year old female who presented for a telephone visit.   They were referred to the pharmacist by their Case Management Team  for assistance in managing  medication reconciliation .    Subjective:  Care Team: Primary Care Provider: Tresa Garter, MD ; Next Scheduled Visit: 01/03/2024 Oncologist: Dr. Mosetta Putt.Next visit 04/23/23  Medication Access/Adherence  Current Pharmacy:  [id number] [place], [place] - [address] [place] [address] Phone: [phone] Fax: [phone]    Patient reports affordability concerns with their medications: No  Patient reports access/transportation concerns to their pharmacy: No  Patient reports adherence concerns with their medications:  Yes  Pt reports she has been out of gabapentin, has been unable to get a refill from PCP. [facility] is [facility]  Patient has several medications listed that she is not currently taking - mostly meds that were prescribed after hypertensive emergency hospitalization 12/25/2022 - hydralazine, losartan, and isosorbide. Pt's carvedilol appears to be overdue for refill x2 months. She states she is going to the pharmacy today to pick up prescriptions.   Hypertension:  Current medications: hydrochlorothiazide 12.5 mg daily, carvedilol 25 mg twice daily Medications previously tried:   Patient has a validated, automated, upper arm home BP cuff. Pt notes her grandsons check her BP regularly. Current blood pressure readings readings: unable to find list  Patient denies hypotensive s/sx including dizziness, lightheadedness.  Patient denies hypertensive symptoms including headache, chest pain, shortness of breath     Objective:  Lab Results  Component Value Date   HGBA1C 5.4 04/28/2021    Lab Results  Component Value  Date   CREATININE 0.87 01/21/2023   BUN 8 01/21/2023   NA 132 (L) 01/21/2023   K 3.5 01/21/2023   CL 97 (L) 01/21/2023   CO2 27 01/21/2023    Lab Results  Component Value Date   CHOL 158 03/01/2018   HDL 47.10 03/01/2018   LDLCALC 94 03/01/2018   TRIG 84.0 03/01/2018   CHOLHDL 3 03/01/2018    Medications Reviewed Today     Reviewed by Bonita Quin, RPH (Pharmacist) on 03/18/23 at 1512  Med List Status: <None>   Medication Order Taking? Sig Documenting Provider Last Dose Status Informant  acetaminophen (TYLENOL) 325 MG tablet [id number] Yes Take 2 tablets (650 mg total) by mouth every 6 (six) hours as needed for mild pain (or Fever >/= 101). Marguerita Merles Bernard, DO Taking Active Child           Med Note Bonita Quin   Thu Mar 18, 2023  2:53 PM) PRN  albuterol [facility] HFA) 108 [phone] Base) MCG/ACT inhaler [id number] Yes Inhale 2 puffs into the lungs every 4 (four) hours as needed. For shortness of breath  Patient taking differently: Inhale 2 puffs into the lungs every 4 (four) hours as needed for shortness of breath.   [name], Georgina Quint, MD Taking Active Child           Med Note Georgeann Oppenheim Mar 18, 2023  2:53 PM) PRN  anagrelide (AGRYLIN) 1 MG capsule [id number] Yes Take 1 capsule (1 mg total) by mouth See admin instructions. TAKE 1 CAPSULE BY MOUTH DAILY EXCEPT 2 DAYS A WEEK (TUESDAYS AND THURSDAYS) DO NOT TAKE.  Patient taking differently: Take 1 mg by  mouth See admin instructions. TAKE 1 CAPSULE BY MOUTH DAILY EXCEPT 2 DAYS A WEEK (TUESDAYS AND THURSDAYS ONLY PER DAUGHTER   [name], Georgina Quint, MD Taking Active Child  Ascorbic Acid (VITAMIN C) 1000 MG tablet [id number] Yes Take 1,000 mg by mouth daily. [provider] Taking Active Child  aspirin 325 MG EC tablet [id number] Yes Take 325 mg by mouth daily after breakfast. [provider] Taking Active Child  carvedilol (COREG) 25 MG tablet [id number] Yes Take 1 tablet (25 mg total) by  mouth 2 (two) times daily. Burnadette Pop, MD Taking Active   Cholecalciferol (VITAMIN D3) 2000 units TABS [id number] Yes Take 2,000 Units by mouth daily. [provider] Taking Active Child  Fluticasone-Salmeterol (ADVAIR) 250-50 MCG/DOSE AEPB [id number] Yes Please specify directions, refills and quantity  Patient taking differently: Inhale 1 puff into the lungs daily.   [name], Georgina Quint, MD Taking Active Child           Med Note Earlene Plater, [place]   Mon Sep 21, 2022  1:25 PM) Reports takes 2 puffs every 4 hours as needed for SOB  gabapentin (NEURONTIN) 100 MG capsule [id number] No Take 1 capsule (100 mg total) by mouth 3 (three) times daily as needed.  Patient not taking: Reported on 03/18/2023   [name], Georgina Quint, MD Not Taking Active   hydrALAZINE (APRESOLINE) 25 MG tablet [id number] No Take 3 tablets (75 mg total) by mouth every 8 (eight) hours.  Patient not taking: Reported on 03/18/2023   Burnadette Pop, MD Not Taking Active   hydrochlorothiazide (MICROZIDE) 12.5 MG capsule [id number] Yes Take 12.5 mg by mouth daily. [provider]  Active   isosorbide mononitrate (IMDUR) 60 MG 24 hr tablet [id number] No Take 1 tablet (60 mg total) by mouth daily.  Patient not taking: Reported on 03/18/2023   Burnadette Pop, MD Not Taking Active   losartan (COZAAR) 100 MG tablet [id number] No Take 1 tablet (100 mg total) by mouth daily.  Patient not taking: Reported on 03/18/2023   Burnadette Pop, MD Not Taking Active     Discontinued 06/28/19 1140   Multiple Vitamins-Minerals (ADULT GUMMY PO) [id number] Yes Take 2 tablets by mouth daily. [provider] Taking Active Child  ondansetron (ZOFRAN) 4 MG tablet [id number] No Take 1 tablet (4 mg total) by mouth every 6 (six) hours as needed for nausea.  Patient not taking: Reported on 03/18/2023   Merlene Laughter, DO Not Taking Active Child  silver sulfADIAZINE (SILVADENE) 1 % cream [id number]  Apply topically daily.  Patient  taking differently: Apply 1 Application topically daily.   Marguerita Merles [place], DO  Active Child           Med Note Camila Li, Kynsli Haapala R   Thu Mar 18, 2023  2:52 PM) PRN  trolamine salicylate (ASPERCREME) 10 % cream [id number] No Apply 1 application topically 2 (two) times daily as needed for muscle pain.  Patient not taking: Reported on 03/18/2023   [provider] Not Taking Active Child              Assessment/Plan:  Calling patient tomorrow to see if she was able to locate her home BP readings to determine if she needs BP med adjustment. May need OV to check BP checked, possible med adjustments.  Called 9/20 and patient did not have BP readings and noted she had not gone to the pharmacy. I will plan to check back next week.  Hypertension: - Currently uncontrolled, goal <130/80 Needing  to get home BP readings to assess BP or get BP checked in office. Consider adding losartan 50 mg daily although suspect adherence may also be an issue.    Follow Up Plan: Telephone follow up in ~1 week  Arbutus Leas, PharmD, BCPS [facility] [phone]  Medical screening examination/treatment/procedure(s) were performed by non-physician practitioner and as supervising physician I was immediately available for consultation/collaboration.  I agree with above. Jacinta Shoe, MD

## 2023-03-19 NOTE — Patient Instructions (Addendum)
It was a pleasure speaking with you!  Continue taking hydrochlorothiazide 12.5 mg daily, carvedilol 25 mg twice daily for your blood pressure.  I will call back next week to try to obtain some blood pressure readings. Your blood pressure has been running high at previous appointments and may require medication adjustment.  Feel free to call me if you have questions or concerns.  Arbutus Leas, PharmD, BCPS [facility] [phone]

## 2023-03-22 ENCOUNTER — Telehealth: Payer: Self-pay | Admitting: Internal Medicine

## 2023-03-22 NOTE — Telephone Encounter (Signed)
Patient's daughter [name] called and said she has been trying to get a call back from the nurse. They are trying to get home health care for her mother for daily care. Melissa would like a call back at [phone].

## 2023-03-23 NOTE — Telephone Encounter (Signed)
Ms Spainhower instructed me to communicate only with her daughter [name], who would be her POA for healthcare; 2024.  The patient does not want other family members to be involved in her healthcare.   Thanks

## 2023-03-23 NOTE — Telephone Encounter (Signed)
Tried to call pts daughter Paula Griffith phone rang busy... as Dr. Posey Rea has stated "[name] instructed me to communicate only with her daughter [name], who would be her POA for healthcare; 2024.  The patient does not want other family members to be involved in her healthcare."  *Please instruct anyne calling for services to have pt or her daughter Paula Griffith call the office to request health care needs as it has been stated to PCP that pt is only wanting her daughter Paula Griffith to discuss anything on her behalf.

## 2023-04-20 ENCOUNTER — Telehealth: Payer: Self-pay | Admitting: Hematology

## 2023-04-22 ENCOUNTER — Other Ambulatory Visit: Payer: Medicare PPO

## 2023-04-22 ENCOUNTER — Ambulatory Visit: Payer: Medicare PPO | Admitting: Hematology

## 2023-04-23 ENCOUNTER — Inpatient Hospital Stay (HOSPITAL_BASED_OUTPATIENT_CLINIC_OR_DEPARTMENT_OTHER): Payer: Medicare PPO | Admitting: Hematology

## 2023-04-23 ENCOUNTER — Inpatient Hospital Stay: Payer: Medicare PPO | Attending: Hematology

## 2023-04-23 ENCOUNTER — Other Ambulatory Visit: Payer: Self-pay

## 2023-04-23 VITALS — BP 181/74 | HR 64 | Temp 98.4°F | Resp 16 | Ht 63.0 in | Wt 120.7 lb

## 2023-04-23 DIAGNOSIS — Z79899 Other long term (current) drug therapy: Secondary | ICD-10-CM | POA: Insufficient documentation

## 2023-04-23 DIAGNOSIS — D473 Essential (hemorrhagic) thrombocythemia: Secondary | ICD-10-CM | POA: Insufficient documentation

## 2023-04-23 DIAGNOSIS — D649 Anemia, unspecified: Secondary | ICD-10-CM | POA: Diagnosis not present

## 2023-04-23 DIAGNOSIS — I10 Essential (primary) hypertension: Secondary | ICD-10-CM | POA: Diagnosis not present

## 2023-04-23 DIAGNOSIS — D471 Chronic myeloproliferative disease: Secondary | ICD-10-CM | POA: Insufficient documentation

## 2023-04-23 LAB — CMP (CANCER CENTER ONLY)
ALT: <5 U/L (ref 0–44)
AST: 14 U/L — ABNORMAL LOW (ref 15–41)
Albumin: 3.9 g/dL (ref 3.5–5.0)
Alkaline Phosphatase: 45 U/L (ref 38–126)
Anion gap: 7 (ref 5–15)
BUN: 16 mg/dL (ref 8–23)
CO2: 31 mmol/L (ref 22–32)
Calcium: 9.4 mg/dL (ref 8.9–10.3)
Chloride: 93 mmol/L — ABNORMAL LOW (ref 98–111)
Creatinine: 1.28 mg/dL — ABNORMAL HIGH (ref 0.44–1.00)
GFR, Estimated: 43 mL/min — ABNORMAL LOW (ref >60)
Glucose, Bld: 126 mg/dL — ABNORMAL HIGH (ref 70–99)
Potassium: 3.2 mmol/L — ABNORMAL LOW (ref 3.5–5.1)
Sodium: 131 mmol/L — ABNORMAL LOW (ref 135–145)
Total Bilirubin: 0.5 mg/dL (ref 0.3–1.2)
Total Protein: 6.8 g/dL (ref 6.5–8.1)

## 2023-04-23 LAB — CBC WITH DIFFERENTIAL (CANCER CENTER ONLY)
Abs Immature Granulocytes: 0.02 10*3/uL (ref 0.00–0.07)
Basophils Absolute: 0 10*3/uL (ref 0.0–0.1)
Basophils Relative: 1 %
Eosinophils Absolute: 0.1 10*3/uL (ref 0.0–0.5)
Eosinophils Relative: 1 %
HCT: 24.3 % — ABNORMAL LOW (ref 36.0–46.0)
Hemoglobin: 8.8 g/dL — ABNORMAL LOW (ref 12.0–15.0)
Immature Granulocytes: 0 %
Lymphocytes Relative: 19 %
Lymphs Abs: 1.2 10*3/uL (ref 0.7–4.0)
MCH: 31.9 pg (ref 26.0–34.0)
MCHC: 36.2 g/dL — ABNORMAL HIGH (ref 30.0–36.0)
MCV: 88 fL (ref 80.0–100.0)
Monocytes Absolute: 0.6 10*3/uL (ref 0.1–1.0)
Monocytes Relative: 9 %
Neutro Abs: 4.3 10*3/uL (ref 1.7–7.7)
Neutrophils Relative %: 70 %
Platelet Count: 545 10*3/uL — ABNORMAL HIGH (ref 150–400)
RBC: 2.76 MIL/uL — ABNORMAL LOW (ref 3.87–5.11)
RDW: 11.9 % (ref 11.5–15.5)
WBC Count: 6.2 10*3/uL (ref 4.0–10.5)
nRBC: 0 % (ref 0.0–0.2)

## 2023-04-23 LAB — IRON AND IRON BINDING CAPACITY (CC-WL,HP ONLY)
Iron: 57 ug/dL (ref 28–170)
Saturation Ratios: 17 % (ref 10.4–31.8)
TIBC: 330 ug/dL (ref 250–450)
UIBC: 273 ug/dL (ref 148–442)

## 2023-04-23 MED ORDER — ANAGRELIDE HCL 1 MG PO CAPS
1.0000 mg | ORAL_CAPSULE | ORAL | 1 refills | Status: DC
Start: 2023-04-23 — End: 2024-01-20

## 2023-04-23 NOTE — Progress Notes (Signed)
[facility]   Telephone:([phone] [phone]   Clinic Follow up Note   Patient Care Team: [name], Georgina Quint, MD as PCP - General (Internal Medicine) Chuck Hint, MD (Inactive) (Vascular Surgery) Nahser, Deloris Ping, MD as Consulting Physician (Cardiology) Malachy Mood, MD as Consulting Physician (Hematology) Arman Bogus, MD as Consulting Physician (Neurosurgery) [facility] as Consulting Physician (Ophthalmology) Colletta Maryland, RN as [facility] Bonita Quin, [facility] (Pharmacist)  Date of Service:  04/23/2023  CHIEF COMPLAINT: f/u of ET  CURRENT THERAPY:  Anagrelide 1 mg daily except Tuesdays and Thursdays  Oncology History   MPN (myeloproliferative neoplasm) (HCC) essential thrombocythemia, JAK2 mutation negative. -We previously reviewed the natural history of ET, most patients do well, major complication is thrombosis. -She was previously on Hydrea, switched to anagrelide in 05/2015 due to anemia from Hydrea. -Her anagrelide dose has been titrated as needed. We will monitor her EKG periodically. She is currently on Anagrelide 1mg  daily except Tuesdays/Thursdays.  -Labs overall stable. Plan to continue Anagrelide 1mg  once daily 5 days a week -lab and F/u in 4 months   Assessment and Plan    Essential Thrombocytopenia Platelet count elevated at 545 (normal 150-400). Patient is compliant with Anagrelide dosing (1 capsule daily except Tuesday and Thursday). Discussed the importance of consistent medication adherence and the potential need to increase the dose if platelet count remains elevated. -Repeat platelet count in 1 month. -If platelet count remains above 500, consider increasing Anagrelide dose.  Anemia Chronic, stable anemia with hemoglobin at 8.8. No symptoms of fatigue or decreased energy. Discussed potential need for further investigation if hemoglobin drops below  8. -Continue monitoring hemoglobin levels. -Consider taking over-the-counter prenatal vitamins for anemia.  Hypertension Blood pressure elevated during visit, potentially due to physical exertion. Patient has home blood pressure monitor and is advised to check blood pressure weekly. -Check blood pressure at home weekly and record readings. -Review blood pressure medications with primary care provider, Dr. Posey Rea.  Medication Management Patient uses a pill box for medication organization. There is some confusion about current medications, and patient is advised to bring medication list or bottles to future appointments. -Bring medication list or bottles to future appointments for review. -Refill Anagrelide prescription to prevent running out.  Follow-up Plan to return in 1 month for lab work and in 3 months for a follow-up appointment. If Anagrelide dose is increased, patient should return in 1 month for a check-up. -Schedule follow-up appointment in 3 months.       SUMMARY OF ONCOLOGIC HISTORY: Oncology History   No history exists.     Discussed the use of AI scribe software for clinical note transcription with the patient, who gave verbal consent to proceed.  History of Present Illness   A 77 year old female with a history of Essential Thrombocythemia (ET) presents for a routine follow-up. She reports no new health issues in the past three months. She is wheelchair-bound due to fatigue from walking long distances. She is currently on Anagrelide, which she takes daily except for two days a week (Tuesday and Thursday). She uses a pillbox to manage her medication schedule and refills it weekly. However, there is some confusion about her medication adherence, which needs to be clarified.  Her recent lab results show a high platelet count of 545, which is the highest it has been in the past year. She also has mild anemia with a hemoglobin level of 8.8. She denies feeling  tired or  having any symptoms related to anemia. She is also on various medications for other conditions, but there is some confusion about which ones she is currently taking.         All other systems were reviewed with the patient and are negative.  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   Spondylolisthesis of lumbar region    Thrombocytopathy [facility]) 2011   Dr Arline Asp    SURGICAL HISTORY: Past Surgical History:  Procedure Laterality Date   ABDOMINAL HYSTERECTOMY  1989   ANTERIOR CERVICAL DECOMP/DISCECTOMY FUSION N/A 09/22/2017   Procedure: ANTERIOR CERVICAL DECOMPRESSION AND FUSION CERVICAL THREE-FOUR.;  Surgeon: Tia Alert, MD;  Location: [facility] OR;  Service: Neurosurgery;  Laterality: N/A;  anterior   BACK SURGERY     BREAST EXCISIONAL BIOPSY Left    BREAST SURGERY     Left   LUMBAR LAMINECTOMY/DECOMPRESSION MICRODISCECTOMY Left 01/06/2018   Procedure: laminectomy Lumbar five -Sacral one - left, Lumbar two-Lumbar three - bilateral;  Surgeon: Tia Alert, MD;  Location: [facility] OR;  Service: Neurosurgery;  Laterality: Left;    I have reviewed the social history and family history with the patient and they are unchanged from previous note.  ALLERGIES:  is allergic to thiazide-type diuretics, benazepril, amlodipine, fluad [influenza vac a&b surf ant adj], other, codeine, and tramadol.  MEDICATIONS:  Current Outpatient Medications  Medication Sig Dispense Refill   acetaminophen (TYLENOL) 325 MG tablet Take 2 tablets (650 mg total) by mouth every 6 (six) hours as  needed for mild pain (or Fever >/= 101). 20 tablet 0   albuterol (PROAIR HFA) 108 (90 Base) MCG/ACT inhaler Inhale 2 puffs into the lungs every 4 (four) hours as needed. For shortness of breath (Patient taking differently: Inhale 2 puffs into the lungs every 4 (four) hours as needed for shortness of breath.) 18 g 5   anagrelide (AGRYLIN) 1 MG capsule Take 1 capsule (1 mg total) by mouth See admin instructions. TAKE 1 CAPSULE BY MOUTH DAILY EXCEPT 2 DAYS A WEEK (TUESDAYS AND THURSDAYS) DO NOT TAKE. 120 capsule 1   Ascorbic Acid (VITAMIN C) 1000 MG tablet Take 1,000 mg by mouth daily.     aspirin 325 MG EC tablet Take 325 mg by mouth daily after breakfast.     carvedilol (COREG) 25 MG tablet Take 1 tablet (25 mg total) by mouth 2 (two) times daily. 60 tablet 0   Cholecalciferol (VITAMIN D3) 2000 units TABS Take 2,000 Units by mouth daily.     Fluticasone-Salmeterol (ADVAIR) 250-50 MCG/DOSE AEPB Please specify directions, refills and quantity (Patient taking differently: Inhale 1 puff into the lungs daily.) 1 each 11   gabapentin (NEURONTIN) 100 MG capsule Take 1 capsule (100 mg total) by mouth 3 (three) times daily as needed. (Patient not taking: Reported on 03/18/2023) 270 capsule 1   hydrALAZINE (APRESOLINE) 25 MG tablet Take 3 tablets (75 mg total) by mouth every 8 (eight) hours. (Patient not taking:  Reported on 03/18/2023) 90 tablet 0   hydrochlorothiazide (MICROZIDE) 12.5 MG capsule Take 12.5 mg by mouth daily.     isosorbide mononitrate (IMDUR) 60 MG 24 hr tablet Take 1 tablet (60 mg total) by mouth daily. (Patient not taking: Reported on 03/18/2023) 30 tablet 0   losartan (COZAAR) 100 MG tablet Take 1 tablet (100 mg total) by mouth daily. (Patient not taking: Reported on 03/18/2023) 30 tablet 0   Multiple Vitamins-Minerals (ADULT GUMMY PO) Take 2 tablets by mouth daily.     ondansetron (ZOFRAN) 4 MG tablet Take 1 tablet (4 mg total) by mouth every 6 (six) hours as needed for nausea. (Patient not  taking: Reported on 03/18/2023) 20 tablet 0   silver sulfADIAZINE (SILVADENE) 1 % cream Apply topically daily. (Patient taking differently: Apply 1 Application topically daily.) 50 g 0   trolamine salicylate (ASPERCREME) 10 % cream Apply 1 application topically 2 (two) times daily as needed for muscle pain. (Patient not taking: Reported on 03/18/2023)     No current facility-administered medications for this visit.    PHYSICAL EXAMINATION: ECOG PERFORMANCE STATUS: 1 - Symptomatic but completely ambulatory  Vitals:   04/23/23 1352 04/23/23 1424  BP: (!) 180/79 (!) 181/74  Pulse:    Resp:    Temp:    SpO2:     Wt Readings from Last 3 Encounters:  04/23/23 120 lb 11.2 oz (54.7 kg)  12/29/22 128 lb 1.4 oz (58.1 kg)  12/29/22 128 lb (58.1 kg)     GENERAL:alert, no distress and comfortable SKIN: skin color, texture, turgor are normal, no rashes or significant lesions EYES: normal, Conjunctiva are pink and non-injected, sclera clear NECK: supple, thyroid normal size, non-tender, without nodularity LYMPH:  no palpable lymphadenopathy in the cervical, axillary  LUNGS: clear to auscultation and percussion with normal breathing effort HEART: regular rate & rhythm and no murmurs and no lower extremity edema ABDOMEN:abdomen soft, non-tender and normal bowel sounds Musculoskeletal:no cyanosis of digits and no clubbing  NEURO: alert & oriented x 3 with fluent speech, no focal motor/sensory deficits  LABORATORY DATA:  I have reviewed the data as listed    Latest Ref Rng & Units 04/23/2023    1:23 PM 01/21/2023    2:33 PM 12/29/2022    4:42 PM  CBC  WBC 4.0 - 10.5 K/uL 6.2  4.3  7.0   Hemoglobin 12.0 - 15.0 g/dL 8.8  8.5  8.4   Hematocrit 36.0 - 46.0 % 24.3  24.1  25.4   Platelets 150 - 400 K/uL 545  154  303         Latest Ref Rng & Units 04/23/2023    1:23 PM 01/21/2023    2:33 PM 12/29/2022    4:42 PM  CMP  Glucose 70 - 99 mg/dL 742  595  638   BUN 8 - 23 mg/dL 16  8  9     Creatinine 0.44 - 1.00 mg/dL 7.56  4.33  2.95   Sodium 135 - 145 mmol/L 131  132  130   Potassium 3.5 - 5.1 mmol/L 3.2  3.5  4.8   Chloride 98 - 111 mmol/L 93  97  98   CO2 22 - 32 mmol/L 31  27  21    Calcium 8.9 - 10.3 mg/dL 9.4  9.1  9.5   Total Protein 6.5 - 8.1 g/dL 6.8  6.3  6.7   Total Bilirubin 0.3 - 1.2 mg/dL 0.5  0.4  0.8   Alkaline Phos  38 - 126 U/L 45  52  46   AST 15 - 41 U/L 14  15  23    ALT 0 - 44 U/L <5  <5  10       RADIOGRAPHIC STUDIES: I have personally reviewed the radiological images as listed and agreed with the findings in the report. No results found.    No orders of the defined types were placed in this encounter.  All questions were answered. The patient knows to call the clinic with any problems, questions or concerns. No barriers to learning was detected. The total time spent in the appointment was 15 minutes.     Malachy Mood, MD 04/23/2023

## 2023-04-23 NOTE — Assessment & Plan Note (Signed)
essential thrombocythemia, JAK2 mutation negative. -We previously reviewed the natural history of ET, most patients do well, major complication is thrombosis. -She was previously on Hydrea, switched to anagrelide in 05/2015 due to anemia from Hydrea. -Her anagrelide dose has been titrated as needed. We will monitor her EKG periodically. She is currently on Anagrelide  daily except Tuesdays/Thursdays.  -Labs overall stable. Plan to continue Anagrelide  once daily 5 days a week -lab and F/u in 4 months

## 2023-04-30 ENCOUNTER — Telehealth: Payer: Self-pay

## 2023-04-30 NOTE — Telephone Encounter (Addendum)
Called patient to relay message below as per Dr. Mosetta Putt. Patient voiced full understanding.    ----- Message from Malachy Mood sent at 04/30/2023 10:41 AM EDT ----- Please let pt know her K is slightly low, encourage high K diet, Cr is slightly elevated, encourage her to increase oral liquid intake, make sure she has lab scheduled in 3-4 weeks, thanks   Malachy Mood

## 2023-05-25 ENCOUNTER — Inpatient Hospital Stay: Payer: Medicare PPO | Attending: Hematology

## 2023-05-25 DIAGNOSIS — D471 Chronic myeloproliferative disease: Secondary | ICD-10-CM

## 2023-05-25 DIAGNOSIS — D649 Anemia, unspecified: Secondary | ICD-10-CM

## 2023-05-25 DIAGNOSIS — D473 Essential (hemorrhagic) thrombocythemia: Secondary | ICD-10-CM | POA: Insufficient documentation

## 2023-05-25 LAB — CMP (CANCER CENTER ONLY)
ALT: <5 U/L (ref 0–44)
AST: 16 U/L (ref 15–41)
Albumin: 4 g/dL (ref 3.5–5.0)
Alkaline Phosphatase: 44 U/L (ref 38–126)
Anion gap: 7 (ref 5–15)
BUN: 21 mg/dL (ref 8–23)
CO2: 29 mmol/L (ref 22–32)
Calcium: 9.7 mg/dL (ref 8.9–10.3)
Chloride: 96 mmol/L — ABNORMAL LOW (ref 98–111)
Creatinine: 0.94 mg/dL (ref 0.44–1.00)
GFR, Estimated: >60 mL/min (ref >60)
Glucose, Bld: 90 mg/dL (ref 70–99)
Potassium: 4.1 mmol/L (ref 3.5–5.1)
Sodium: 132 mmol/L — ABNORMAL LOW (ref 135–145)
Total Bilirubin: 0.4 mg/dL (ref <1.2)
Total Protein: 7.2 g/dL (ref 6.5–8.1)

## 2023-05-25 LAB — CBC WITH DIFFERENTIAL (CANCER CENTER ONLY)
Abs Immature Granulocytes: 0.01 10*3/uL (ref 0.00–0.07)
Basophils Absolute: 0.1 10*3/uL (ref 0.0–0.1)
Basophils Relative: 1 %
Eosinophils Absolute: 0.1 10*3/uL (ref 0.0–0.5)
Eosinophils Relative: 2 %
HCT: 26.9 % — ABNORMAL LOW (ref 36.0–46.0)
Hemoglobin: 9.2 g/dL — ABNORMAL LOW (ref 12.0–15.0)
Immature Granulocytes: 0 %
Lymphocytes Relative: 26 %
Lymphs Abs: 1.4 10*3/uL (ref 0.7–4.0)
MCH: 31.8 pg (ref 26.0–34.0)
MCHC: 34.2 g/dL (ref 30.0–36.0)
MCV: 93.1 fL (ref 80.0–100.0)
Monocytes Absolute: 0.6 10*3/uL (ref 0.1–1.0)
Monocytes Relative: 11 %
Neutro Abs: 3.2 10*3/uL (ref 1.7–7.7)
Neutrophils Relative %: 60 %
Platelet Count: 417 10*3/uL — ABNORMAL HIGH (ref 150–400)
RBC: 2.89 MIL/uL — ABNORMAL LOW (ref 3.87–5.11)
RDW: 11.8 % (ref 11.5–15.5)
WBC Count: 5.4 10*3/uL (ref 4.0–10.5)
nRBC: 0 % (ref 0.0–0.2)

## 2023-05-25 LAB — IRON AND IRON BINDING CAPACITY (CC-WL,HP ONLY)
Iron: 55 ug/dL (ref 28–170)
Saturation Ratios: 16 % (ref 10.4–31.8)
TIBC: 344 ug/dL (ref 250–450)
UIBC: 289 ug/dL (ref 148–442)

## 2023-07-22 ENCOUNTER — Inpatient Hospital Stay (HOSPITAL_BASED_OUTPATIENT_CLINIC_OR_DEPARTMENT_OTHER): Payer: Medicare PPO | Admitting: Hematology

## 2023-07-22 ENCOUNTER — Encounter: Payer: Self-pay | Admitting: Hematology

## 2023-07-22 ENCOUNTER — Inpatient Hospital Stay: Payer: Medicare PPO | Attending: Hematology

## 2023-07-22 VITALS — BP 180/110 | HR 79 | Temp 98.3°F | Resp 18 | Wt 118.3 lb

## 2023-07-22 DIAGNOSIS — I10 Essential (primary) hypertension: Secondary | ICD-10-CM | POA: Diagnosis not present

## 2023-07-22 DIAGNOSIS — D471 Chronic myeloproliferative disease: Secondary | ICD-10-CM | POA: Insufficient documentation

## 2023-07-22 DIAGNOSIS — D649 Anemia, unspecified: Secondary | ICD-10-CM | POA: Insufficient documentation

## 2023-07-22 DIAGNOSIS — D473 Essential (hemorrhagic) thrombocythemia: Secondary | ICD-10-CM | POA: Insufficient documentation

## 2023-07-22 DIAGNOSIS — Z79899 Other long term (current) drug therapy: Secondary | ICD-10-CM | POA: Insufficient documentation

## 2023-07-22 LAB — CBC WITH DIFFERENTIAL (CANCER CENTER ONLY)
Abs Immature Granulocytes: 0.01 10*3/uL (ref 0.00–0.07)
Basophils Absolute: 0 10*3/uL (ref 0.0–0.1)
Basophils Relative: 1 %
Eosinophils Absolute: 0.1 10*3/uL (ref 0.0–0.5)
Eosinophils Relative: 1 %
HCT: 25.8 % — ABNORMAL LOW (ref 36.0–46.0)
Hemoglobin: 9.5 g/dL — ABNORMAL LOW (ref 12.0–15.0)
Immature Granulocytes: 0 %
Lymphocytes Relative: 26 %
Lymphs Abs: 1.4 10*3/uL (ref 0.7–4.0)
MCH: 30.5 pg (ref 26.0–34.0)
MCHC: 36.8 g/dL — ABNORMAL HIGH (ref 30.0–36.0)
MCV: 83 fL (ref 80.0–100.0)
Monocytes Absolute: 0.5 10*3/uL (ref 0.1–1.0)
Monocytes Relative: 9 %
Neutro Abs: 3.5 10*3/uL (ref 1.7–7.7)
Neutrophils Relative %: 63 %
Platelet Count: ADEQUATE 10*3/uL (ref 150–400)
RBC: 3.11 MIL/uL — ABNORMAL LOW (ref 3.87–5.11)
RDW: 10.9 % — ABNORMAL LOW (ref 11.5–15.5)
WBC Count: 5.5 10*3/uL (ref 4.0–10.5)
nRBC: 0 % (ref 0.0–0.2)

## 2023-07-22 LAB — CMP (CANCER CENTER ONLY)
ALT: 5 U/L (ref 0–44)
AST: 23 U/L (ref 15–41)
Albumin: 4.2 g/dL (ref 3.5–5.0)
Alkaline Phosphatase: 49 U/L (ref 38–126)
Anion gap: 10 (ref 5–15)
BUN: 10 mg/dL (ref 8–23)
CO2: 32 mmol/L (ref 22–32)
Calcium: 10 mg/dL (ref 8.9–10.3)
Chloride: 80 mmol/L — ABNORMAL LOW (ref 98–111)
Creatinine: 0.91 mg/dL (ref 0.44–1.00)
GFR, Estimated: >60 mL/min (ref >60)
Glucose, Bld: 90 mg/dL (ref 70–99)
Potassium: 2.9 mmol/L — ABNORMAL LOW (ref 3.5–5.1)
Sodium: 122 mmol/L — ABNORMAL LOW (ref 135–145)
Total Bilirubin: 0.6 mg/dL (ref 0.0–1.2)
Total Protein: 7.4 g/dL (ref 6.5–8.1)

## 2023-07-22 LAB — IRON AND IRON BINDING CAPACITY (CC-WL,HP ONLY)
Iron: 66 ug/dL (ref 28–170)
Saturation Ratios: 18 % (ref 10.4–31.8)
TIBC: 367 ug/dL (ref 250–450)
UIBC: 301 ug/dL (ref 148–442)
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
## 2023-07-22 NOTE — Assessment & Plan Note (Signed)
essential thrombocythemia, JAK2 mutation negative. -We previously reviewed the natural history of ET, most patients do well, major complication is thrombosis. -She was previously on Hydrea, switched to anagrelide in 05/2015 due to anemia from Hydrea. -Her anagrelide dose has been titrated as needed. We will monitor her EKG periodically. She is currently on Anagrelide  daily except Tuesdays/Thursdays.  -Labs overall stable. Plan to continue Anagrelide  once daily 5 days a week -lab and F/u in 4 months

## 2023-07-23 ENCOUNTER — Telehealth: Payer: Self-pay

## 2023-07-23 ENCOUNTER — Other Ambulatory Visit: Payer: Self-pay | Admitting: Hematology

## 2023-07-23 MED ORDER — POTASSIUM CHLORIDE CRYS ER 20 MEQ PO TBCR: 20.0000 meq | EXTENDED_RELEASE_TABLET | Freq: Two times a day (BID) | ORAL | 0 refills | Status: DC

## 2023-07-23 NOTE — Telephone Encounter (Addendum)
Called patient to relay message below as per Dr. Mosetta Putt. Patient voiced full understanding with no further questions at this time.   ----- Message from Paula Griffith sent at 07/23/2023  1:20 PM EST ----- I called pt and informed his low Na and K, she is on KCL daily and hydrochlorothiazide. I called in additional KCL for her and she will take 1 tab tid for a week then change to bid, I strongly encouraged her to see her PCP in a few weeks with repeated lab, and adjust her BP meds if needed. For low Na, she ill increase Na intake in diet and hydrate herself better.   Paula Griffith, please call Dr. Loren Racer office and remind him to check the Epic staff message and this message (he is coped here), and my recommendation of lab and OV with him in 2-3 weeks  Paula Griffith

## 2023-08-02 ENCOUNTER — Telehealth: Payer: Self-pay

## 2023-08-02 NOTE — Patient Outreach (Signed)
  Care Coordination   Follow Up Visit Note   08/02/2023 Name: Paula Griffith MRN: [id number] DOB: Sep 11, 1945  Paula Griffith is a 78 y.o. year old female who sees [name], Georgina Quint, MD for primary care. I spoke with  Paula Griffith by phone today.  What matters to the patients health and wellness today?  Paula Griffith reports she is doing well, but does not have a lot of time to talk today. She states she has a BP monitor and does check her BP. RNCM confirmed with Patient that it is ok to speak with Daughters Paula Griffith and Paula Griffith regarding healthcare as noted in [place].  Goals Addressed             This Visit's Progress    Continue to maintain and/or improved health       Interventions Today    Flowsheet Row Most Recent Value  Chronic Disease   Chronic disease during today's visit Hypertension (HTN)  General Interventions   General Interventions Discussed/Reviewed General Interventions Reviewed, Durable Medical Equipment (DME)  [Evaluation of current treatment plan for health condition and patient's adherence to plan. assessed if patient checking blood pressure]  Durable Medical Equipment (DME) BP Cuff  [assess if patient has BP monitor and is checking BP]  Education Interventions   Education Provided Provided Education  Provided Verbal Education On Other  [advised to check BP and record.]    SDOH assessments and interventions completed:  No  Care Coordination Interventions:  Yes, provided   Follow up plan: Follow up call scheduled for 08/11/23    Encounter Outcome:  Patient Visit Completed   Kathyrn Sheriff, RN, MSN, BSN, CCM [place]  [facility], Population Health Case Manager Phone: [phone]

## 2023-08-02 NOTE — Patient Instructions (Signed)
Visit Information  Thank you for taking time to visit with me today. Please don't hesitate to contact me if I can be of assistance to you.   Following are the goals we discussed today:   Our next appointment is by telephone on 08/11/23 at 2:00 pm Continue to attend provider visits as scheduled Contact provider with health questions or concerns as needed Continue to check blood pressure routinely and contact provider if questions or concerns  Please call the care guide team at [phone] if you need to cancel or reschedule your appointment.   If you are experiencing a Mental Health or Behavioral Health Crisis or need someone to talk to, please call the Suicide and Crisis Lifeline: 988 call the [place] National Suicide Prevention Lifeline: (407)250-2248 or TTY: 951-002-2278 TTY (603) 014-3774) to talk to a trained counselor   Kathyrn Sheriff, RN, MSN, BSN, CCM [place]  [facility], Population Health Case Manager Phone: [phone]

## 2023-08-03 ENCOUNTER — Ambulatory Visit: Payer: Self-pay | Admitting: Internal Medicine
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
## 2023-08-04 ENCOUNTER — Encounter: Payer: Self-pay | Admitting: Internal Medicine

## 2023-08-04 ENCOUNTER — Ambulatory Visit: Payer: Medicare PPO | Admitting: Internal Medicine

## 2023-08-04 VITALS — BP 160/94 | HR 108 | Temp 98.2°F | Ht 63.0 in | Wt 117.0 lb

## 2023-08-04 DIAGNOSIS — W19XXXD Unspecified fall, subsequent encounter: Secondary | ICD-10-CM

## 2023-08-04 DIAGNOSIS — R739 Hyperglycemia, unspecified: Secondary | ICD-10-CM | POA: Diagnosis not present

## 2023-08-04 DIAGNOSIS — I10 Essential (primary) hypertension: Secondary | ICD-10-CM | POA: Diagnosis not present

## 2023-08-04 MED ORDER — HYDROCODONE-ACETAMINOPHEN 5-325 MG PO TABS: ORAL_TABLET | ORAL | 0 refills | Status: AC

## 2023-08-04 NOTE — Patient Instructions (Addendum)
Please take all new medication as prescribed  Please continue all other medications as before, and refills have been done if requested.  Please have the pharmacy call with any other refills you may need.  Please continue your efforts at being more active, low cholesterol diet, and weight control.  Please keep your appointments with your specialists as you may have planned    

## 2023-08-04 NOTE — Progress Notes (Signed)
 Patient ID: Paula Griffith, female   DOB: [birth year], 78 y.o.   MRN: [id number]        Chief Complaint: follow up after fall on wet floor       HPI:  Paula Griffith is a 78 y.o. female here with family, pt had a slip on wet floor, fell to right side, primarily right temple and hip and shoulder last wk, still quite painful with some swelling but no functional changes such as mental status change or reduced ROM to shoulder and hip.  No further falls since then.  Has good support at home.  Pt denies chest pain, increased sob or doe, wheezing, orthopnea, PND, increased LE swelling, palpitations, dizziness or syncope. Pt denies polydipsia, polyuria, or new focal neuro s/s.  Pt denies fever, wt loss, night sweats, loss of appetite, or other constitutional symptoms  BP improved but remains high today, pt wants to attribute this to pain.       Wt Readings from Last 3 Encounters:  08/04/23 117 lb (53.1 kg)  07/22/23 118 lb 4.8 oz (53.7 kg)  04/23/23 120 lb 11.2 oz (54.7 kg)   BP Readings from Last 3 Encounters:  08/04/23 (!) 160/94  07/22/23 (!) 180/110  04/23/23 (!) 181/74         Past Medical History:  Diagnosis Date   Allergic rhinitis    Asthma    Cervicalgia    Colon polyps    Complication of anesthesia    Diverticulosis of colon    Glucose intolerance (impaired glucose tolerance)    HTN (hypertension)    LBP (low back pain)    Leukemia (HCC)    Osteoarthritis    PONV (postoperative nausea and vomiting)    PVD (peripheral vascular disease) (HCC) 2011   Right Carotid Dr Eliza   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 OR;  Service: Neurosurgery;   Laterality: N/A;  anterior   BACK SURGERY     BREAST EXCISIONAL BIOPSY Left    BREAST SURGERY     Left   LUMBAR LAMINECTOMY/DECOMPRESSION MICRODISCECTOMY Left 01/06/2018   Procedure: laminectomy Lumbar five -Sacral one - left, Lumbar two-Lumbar three - bilateral;  Surgeon: Joshua Alm RAMAN, MD;  Location: [facility] OR;  Service: Neurosurgery;  Laterality: Left;    reports that she has never smoked. She has been exposed to tobacco smoke. She has never used smokeless tobacco. She reports that she does not drink alcohol and does not use drugs. family history includes Arthritis in her mother; Breast cancer in her maternal grandmother; Coronary artery disease in an other family member; Diabetes in her sister; Early death (age of onset: 33) in her father; Lung cancer in an other family member; Pancreatic cancer in her mother. Allergies  Allergen Reactions   Thiazide-Type Diuretics Other (See Comments)    Hyponatremia 119 in march 2023 (was taking HCTZ)   Benazepril  Cough    cough   Amlodipine      Leg edema at 5 mg/d   Fluad [Influenza Vac A&B Surf Ant Adj]     Very sick   Other     BAND-AID CAUSE SKIN TEARS/BRUISES   Codeine Nausea  Only   Tramadol  Other (See Comments)    Dizzy, nauseated   Current Outpatient Medications on File Prior to Visit  Medication Sig Dispense Refill   acetaminophen  (TYLENOL ) 325 MG tablet Take 2 tablets (650 mg total) by mouth every 6 (six) hours as needed for mild pain (or Fever >/= 101). 20 tablet 0   albuterol  (PROAIR  HFA) 108 (90 Base) MCG/ACT inhaler Inhale 2 puffs into the lungs every 4 (four) hours as needed. For shortness of breath (Patient taking differently: Inhale 2 puffs into the lungs every 4 (four) hours as needed for shortness of breath.) 18 g 5   anagrelide  (AGRYLIN) 1 MG capsule Take 1 capsule (1 mg total) by mouth See admin instructions. TAKE 1 CAPSULE BY MOUTH DAILY EXCEPT 2 DAYS A WEEK (TUESDAYS AND THURSDAYS) DO NOT TAKE. 120 capsule 1   Ascorbic Acid   (VITAMIN C) 1000 MG tablet Take 1,000 mg by mouth daily.     aspirin  325 MG EC tablet Take 325 mg by mouth daily after breakfast.     carvedilol  (COREG ) 25 MG tablet Take 1 tablet (25 mg total) by mouth 2 (two) times daily. 60 tablet 0   Cholecalciferol  (VITAMIN D3) 2000 units TABS Take 2,000 Units by mouth daily.     Fluticasone -Salmeterol (ADVAIR) 250-50 MCG/DOSE AEPB Please specify directions, refills and quantity (Patient taking differently: Inhale 1 puff into the lungs daily.) 1 each 11   gabapentin  (NEURONTIN ) 100 MG capsule Take 1 capsule (100 mg total) by mouth 3 (three) times daily as needed. 270 capsule 1   hydrALAZINE  (APRESOLINE ) 25 MG tablet Take 3 tablets (75 mg total) by mouth every 8 (eight) hours. 90 tablet 0   hydrochlorothiazide  (MICROZIDE ) 12.5 MG capsule Take 12.5 mg by mouth daily.     isosorbide  mononitrate (IMDUR ) 60 MG 24 hr tablet Take 1 tablet (60 mg total) by mouth daily. 30 tablet 0   losartan  (COZAAR ) 100 MG tablet Take 1 tablet (100 mg total) by mouth daily. 30 tablet 0   Multiple Vitamins-Minerals (ADULT GUMMY PO) Take 2 tablets by mouth daily.     ondansetron  (ZOFRAN ) 4 MG tablet Take 1 tablet (4 mg total) by mouth every 6 (six) hours as needed for nausea. 20 tablet 0   potassium chloride  SA (KLOR-CON  M) 20 MEQ tablet Take 1 tablet (20 mEq total) by mouth 2 (two) times daily. Take 1 tab three time a day for one week then change to twice daily 90 tablet 0   silver  sulfADIAZINE  (SILVADENE ) 1 % cream Apply topically daily. (Patient taking differently: Apply 1 Application topically daily.) 50 g 0   trolamine salicylate (ASPERCREME) 10 % cream Apply 1 application  topically 2 (two) times daily as needed for muscle pain.     [DISCONTINUED] losartan -hydrochlorothiazide  (HYZAAR) 100-25 MG tablet Take 1 tablet by mouth daily. 90 tablet 3   No current facility-administered medications on file prior to visit.        ROS:  All others reviewed and negative.  Objective         PE:  BP (!) 160/94 (BP Location: Left Arm, Patient Position: Sitting, Cuff Size: Normal)   Pulse (!) 108   Temp 98.2 F (36.8 C) (Oral)   Ht 5' 3 (1.6 m)   Wt 117 lb (53.1 kg)   SpO2 98%   BMI 20.73 kg/m                 Constitutional: Pt appears in NAD  HENT: Head: NCAT.                Right Ear: External ear normal.                 Left Ear: External ear normal.                Eyes: . Pupils are equal, round, and reactive to light. Conjunctivae and EOM are normal               Nose: without d/c or deformity               Neck: Neck supple. Gross normal ROM               Cardiovascular: Normal rate and regular rhythm.                 Pulmonary/Chest: Effort normal and breath sounds without rales or wheezing.                Abd:  Soft, NT, ND, + BS, no organomegaly               Neurological: Pt is alert. At baseline orientation, motor grossly intact               Skin: Skin is warm. No rashes, no other new lesions, LE edema - none               Psychiatric: Pt behavior is normal without agitation   Micro: none  Cardiac tracings I have personally interpreted today:  none  Pertinent Radiological findings (summarize): none   Lab Results  Component Value Date   WBC 5.5 07/22/2023   HGB 9.5 (L) 07/22/2023   HCT 25.8 (L) 07/22/2023   PLT  07/22/2023    PLATELET CLUMPS NOTED ON SMEAR, COUNT APPEARS ADEQUATE   GLUCOSE 90 07/22/2023   CHOL 158 03/01/2018   TRIG 84.0 03/01/2018   HDL 47.10 03/01/2018   LDLCALC 94 03/01/2018   ALT 5 07/22/2023   AST 23 07/22/2023   NA 122 (L) 07/22/2023   K 2.9 (L) 07/22/2023   CL 80 (L) 07/22/2023   CREATININE 0.91 07/22/2023   BUN 10 07/22/2023   CO2 32 07/22/2023   TSH 1.975 09/03/2022   INR 1.0 09/01/2022   HGBA1C 5.4 04/28/2021   Assessment/Plan:  MASHONDA BROSKI is a 78 y.o. Black or African American [2] female with  has a past medical history of Allergic rhinitis, Asthma, Cervicalgia, Colon polyps, Complication of  anesthesia, Diverticulosis of colon, Glucose intolerance (impaired glucose tolerance), HTN (hypertension), LBP (low back pain), Leukemia (HCC), Osteoarthritis, PONV (postoperative nausea and vomiting), PVD (peripheral vascular disease) (HCC) (2011), Retinal infarct (2011), Spondylolisthesis of lumbar region, and Thrombocytopathy (HCC) (2011).  Fall Non recurrent lately, has no significant functional change since fall last wk, ok for vicodin 1/2 prn only very occasional use refilled today; declines PT referral for now,  to f/u any worsening symptoms or concerns  HTN (hypertension), malignant Uncontrolled and difficult resistant in past, pt and family attribute to pain today, decline med change  BP Readings from Last 3 Encounters:  08/04/23 (!) 160/94  07/22/23 (!) 180/110  04/23/23 (!) 181/74    Hyperglycemia Lab Results  Component Value Date   HGBA1C 5.4 04/28/2021   Stable, pt to continue current medical treatment  - diet,wt control  Followup: Return if symptoms worsen or fail to improve.  Lynwood Rush, MD 08/08/2023 11:43 AM  Medical Group  [facility]

## 2023-08-08 ENCOUNTER — Encounter: Payer: Self-pay | Admitting: Internal Medicine

## 2023-08-08 NOTE — Assessment & Plan Note (Signed)
 Uncontrolled and difficult resistant in past, pt and family attribute to pain today, decline med change  BP Readings from Last 3 Encounters:  08/04/23 (!) 160/94  07/22/23 (!) 180/110  04/23/23 (!) 181/74

## 2023-08-08 NOTE — Assessment & Plan Note (Signed)
 Non recurrent lately, has no significant functional change since fall last wk, ok for vicodin 1/2 prn only very occasional use refilled today; declines PT referral for now,  to f/u any worsening symptoms or concerns

## 2023-08-08 NOTE — Assessment & Plan Note (Signed)
 Lab Results  Component Value Date   HGBA1C 5.4 04/28/2021   Stable, pt to continue current medical treatment  - diet,wt control

## 2023-08-11 ENCOUNTER — Ambulatory Visit: Payer: Self-pay

## 2023-08-11 DIAGNOSIS — I10 Essential (primary) hypertension: Secondary | ICD-10-CM

## 2023-08-11 NOTE — Patient Outreach (Signed)
  Care Coordination   Follow Up Visit Note   08/11/2023 Name: Paula Griffith MRN: [id number] DOB: July 16, 1945  Paula Griffith is a 78 y.o. year old female who sees [name], Georgina Quint, MD for primary care. I spoke with  [name] by phone today.  What matters to the patients health and wellness today?  Patient states, "I am doing fine". She reports that she tripped over her dog(floor wet due to rain), while coming into the house. Follow up visit for fall with primary provider on 08/04/23. She denies any problems resulting from the fall. Last office visit BP 160/94. Patient reports has not been checked since provider visit, but states, she has a BP cuff and daughter will check. Per review of medication list, patient states she does not have refills on: Hydralazine, Imdur, Losartan, carvedilol, Potassium, Advair inhaler Albuterol inhaler and Vitamin C. RNCM instructed Vitamin C is OTC. RNCM [facility] [address] and was informed that some of the medications are not on the pharmacy profile, such as Hydralazine to obtain refill. Patient has been active with clinical pharmacist in the past. RNCM will refer to clinical pharmacist for disease management and assist with medication management. Patient is in agreement.  Goals Addressed             This Visit's Progress    Continue to maintain and/or improved health       Interventions Today    Flowsheet Row Most Recent Value  Chronic Disease   Chronic disease during today's visit Hypertension (HTN)  General Interventions   General Interventions Discussed/Reviewed General Interventions Reviewed, Doctor Visits  [Evaluation of current treatment plan for health condition and patient's adherence to plan. assess if BP has been checked since last provider office visit.]  Doctor Visits Discussed/Reviewed Doctor Visits Reviewed, PCP  PCP/Specialist Visits Compliance with follow-up visit  [reviewed upcoming scheduled appointments]   Education Interventions   Education Provided Provided Education  Provided Verbal Education On Other, When to see the doctor, Medication, Exercise  [encouraged to remain as active as possible, take medications as prescribed, attend provider visits as scheduled/recommended.]  Pharmacy Interventions   Pharmacy Dicussed/Reviewed Pharmacy Topics Reviewed, Medications and their functions, Medication Adherence  [medications reviewed. call to patient's pharmacy to request if refills available on the medications.]  Safety Interventions   Safety Discussed/Reviewed Fall Risk, Safety Discussed  [discussed fall prevention strategies]            SDOH assessments and interventions completed:  No  Care Coordination Interventions:  Yes, provided   Follow up plan:  10/09/23    Encounter Outcome:  Patient Visit Completed   Kathyrn Sheriff, RN, MSN, BSN, CCM [place]  [facility], Population Health Case Manager Phone: [phone]

## 2023-08-11 NOTE — Patient Instructions (Signed)
Visit Information  Thank you for taking time to visit with me today. Please don't hesitate to contact me if I can be of assistance to you.   Following are the goals we discussed today:  Continue to take medications as prescribed. Continue to attend provider visits as scheduled Continue to eat healthy, lean meats, vegetables, fruits, avoid saturated and transfats Contact provider with health questions or concerns as needed Continue to check blood pressure routinely and contact provider if questions or concerns  Our next appointment is by telephone on 09/08/23 at 11:30 am  Please call the care guide team at [phone] if you need to cancel or reschedule your appointment.   If you are experiencing a Mental Health or Behavioral Health Crisis or need someone to talk to, please call the Suicide and Crisis Lifeline: 988 call the [place] National Suicide Prevention Lifeline: 925 310 7886 or TTY: 248-818-4735 TTY (856) 268-1727) to talk to a trained counselor  Kathyrn Sheriff, RN, MSN, BSN, CCM [place]  [facility], Population Health Case Manager Phone: [phone]

## 2023-08-13 ENCOUNTER — Telehealth: Payer: Self-pay

## 2023-08-13 NOTE — Progress Notes (Signed)
Care Guide Pharmacy Note  08/13/2023 Name: Paula Griffith MRN: [id number] DOB: 05-Nov-1945  Referred By: Tresa Garter, MD Reason for referral: Care Coordination (Outreach to schedule with Pharm d )   Paula Griffith is a 78 y.o. year old female who is a primary care patient of [name], Georgina Quint, MD.  Paula Griffith was referred to the pharmacist for assistance related to: HTN  Successful contact was made with the patient to discuss pharmacy services including being ready for the pharmacist to call at least 5 minutes before the scheduled appointment time and to have medication bottles and any blood pressure readings ready for review. The patient agreed to meet with the pharmacist via telephone visit on (date/time).08/26/2023  [name] , RMA     [place]  [facility] Hospital Guide  Direct Dial: [phone]  Website: [email or web address]

## 2023-08-26 ENCOUNTER — Other Ambulatory Visit: Payer: Medicare PPO | Admitting: Pharmacist

## 2023-08-26 DIAGNOSIS — I10 Essential (primary) hypertension: Secondary | ICD-10-CM

## 2023-08-26 NOTE — Progress Notes (Addendum)
   08/26/2023 Name: Paula Griffith MRN: [id number] DOB: [birth year]  Chief Complaint  Patient presents with   Hypertension   Medication Management    Paula Griffith is a 78 y.o. year old female who presented for a telephone visit.   They were referred to the pharmacist by their Case Management Team  for assistance in managing  medication reconciliation .    Subjective:  Care Team: Primary Care Provider: Tresa Garter, MD ; Next Scheduled Visit: not scheduled  Medication Access/Adherence  [phone] [place], [place] - [address] [place] [address] Phone: [phone] Fax: [phone]    Patient reports affordability concerns with their medications: No  Patient reports access/transportation concerns to their pharmacy: No  Patient reports adherence concerns with their medications:  Yes    Pt states she takes all of her medications that are prescribed to her however patient has several medications listed that she is not currently taking - mostly meds that were prescribed after hypertensive emergency hospitalization 12/25/2022 - hydralazine, losartan, and isosorbide. She has not been taking carvedilol   Hypertension:  Current medications: hydrochlorothiazide 12.5 mg daily  **Pt never took potassium chloride after Dr. Mosetta Putt sent rx after January labs showed very low potassium  Patient has a validated, automated, upper arm home BP cuff. Pt notes her grandsons check her BP regularly. Current blood pressure readings readings: not readings   Objective:  Lab Results  Component Value Date   HGBA1C 5.4 04/28/2021    Lab Results  Component Value Date   CREATININE 0.91 07/22/2023   BUN 10 07/22/2023   NA 122 (L) 07/22/2023   K 2.9 (L) 07/22/2023   CL 80 (L) 07/22/2023   CO2 32 07/22/2023    Lab Results  Component Value Date   CHOL 158 03/01/2018   HDL 47.10 03/01/2018   LDLCALC 94 03/01/2018    TRIG 84.0 03/01/2018   CHOLHDL 3 03/01/2018    Medications Reviewed Today   Medications were not reviewed in this encounter      Assessment/Plan:    Hypertension: - Currently uncontrolled, goal <130/80 - hx of very uncontrolled BP over the last year, appears due to medication nonadherence - Updated medication list to remove all medications she has not been taking for awhile to avoid confusion - [facility]. Informed pt to pick up and start taking ASAP. - Scheduled PCP follow up to check BP and get labs updated - no PCP visit since 11/2022 - She has hx of hypokalemia, recommend changing hydrochlorothiazide to spironolactone    Follow Up Plan:  F/u after PCP f/u   Arbutus Leas, PharmD, BCPS, CPP Clinical Pharmacist Practitioner [place] [facility] [phone]   Medical screening examination/treatment/procedure(s) were performed by non-physician practitioner and as supervising physician I was immediately available for consultation/collaboration.  I agree with above. Jacinta Shoe, MD

## 2023-08-26 NOTE — Patient Instructions (Signed)
 It was a pleasure speaking with you today!  Please be sure to start potassium chloride for your low potassium.  Come for appt with Dr. Posey Rea 3/13.  Feel free to call with any questions or concerns!  Arbutus Leas, PharmD, BCPS, CPP Clinical Pharmacist Practitioner  Primary [facility] at [facility] [phone]

## 2023-08-28 ENCOUNTER — Other Ambulatory Visit: Payer: Self-pay | Admitting: Internal Medicine

## 2023-09-08 ENCOUNTER — Ambulatory Visit: Payer: Self-pay

## 2023-09-08 NOTE — Patient Instructions (Addendum)
 Visit Information  Thank you for taking time to visit with me today. Please don't hesitate to contact me if I can be of assistance to you.   Following are the goals we discussed today:  Take your Medicine bottles and BP monitor with you to your provider visit on 09/09/23 Continue to take medications as prescribed. Continue to attend provider visits as scheduled Continue to eat healthy, avoid foods high is sodium(salt) Contact provider with health questions or concerns as needed Continue to check blood pressure routinely and contact provider if questions or concerns  Our next appointment is by telephone on 09/14/23 at 2:30 pm  Please call the care guide team at [phone] if you need to cancel or reschedule your appointment.   If you are experiencing a Mental Health or Behavioral Health Crisis or need someone to talk to, please call the Suicide and Crisis Lifeline: 988 call the [place] National Suicide Prevention Lifeline: 956-455-6962 or TTY: (585)836-0057 TTY 316-843-8206) to talk to a trained counselor   Kathyrn Sheriff, RN, MSN, BSN, CCM [place]  [facility], Population Health Case Manager Phone: [phone]

## 2023-09-08 NOTE — Patient Outreach (Signed)
 Care Coordination   Follow Up Visit Note   09/08/2023 Name: Paula Griffith MRN: [id number] DOB: 1945-11-25  Paula Griffith is a 78 y.o. year old female who sees [name], Georgina Quint, MD for primary care. I spoke with  Paula Griffith by phone today.  What matters to the patients health and wellness today?  Paula Griffith reports "I am doing great". Per review of chart: 08/26/23 clinical pharmacy telephone visit- patient instructed to pick up potassium and begin to take. Paula Griffith reports, [facility] was was told they did not have a prescription, therefore she has not taken potassium. She states her daughters take her blood pressure routinely, but patient does not know the readings. Paula Griffith has an upcoming appointment with PCP on tomorrow. RNCM encouraged  patient to take pill bottles with her to her appointment, as well as, BP monitor so to check for Monitor accuracy/saved readings. Patient is active with clinical pharmacist.  Goals Addressed             This Visit's Progress    Continue to maintain and/or improved health       Interventions Today    Flowsheet Row Most Recent Value  Chronic Disease   Chronic disease during today's visit Hypertension (HTN)  General Interventions   General Interventions Discussed/Reviewed General Interventions Reviewed, Doctor Visits, Communication with  [Evaluation of current treatment plan for health condition and patient's adherence to plan. Assessed for BP readings, medications reviewed with patient]  Doctor Visits Discussed/Reviewed PCP, Specialist, Doctor Visits Reviewed  PCP/Specialist Visits Compliance with follow-up visit  [reviewed upcoming provider appointments]  Communication with PCP/Specialists  [message to update primary care provider]  Exercise Interventions   Exercise Discussed/Reviewed Physical Activity  [encouraged to remain as active as tolerated]  Education Interventions   Education Provided Provided  Education  Provided Verbal Education On When to see the doctor, Medication, Nutrition  [advised to take medications as prescribed, attend provider visits as scheduled, discussed food high in sodium]  Nutrition Interventions   Nutrition Discussed/Reviewed Nutrition Reviewed  [discussed salt intake and high sodium foods to avoid ie. processed and canned foods, adding salt to foods.]  Pharmacy Interventions   Pharmacy Dicussed/Reviewed Pharmacy Topics Reviewed  [medications reviewed]            SDOH assessments and interventions completed:  No  Care Coordination Interventions:  Yes, provided   Follow up plan: Follow up call scheduled for 09/14/23    Encounter Outcome:  Patient Visit Completed   Paula Sheriff, RN, MSN, BSN, CCM [place]  [facility], Population Health Case Manager Phone: [phone]

## 2023-09-09 ENCOUNTER — Other Ambulatory Visit: Payer: Self-pay | Admitting: Internal Medicine

## 2023-09-09 ENCOUNTER — Ambulatory Visit: Payer: Medicare PPO | Admitting: Internal Medicine

## 2023-09-09 ENCOUNTER — Encounter: Payer: Self-pay | Admitting: Internal Medicine

## 2023-09-09 VITALS — BP 158/92 | HR 78 | Temp 98.4°F | Ht 63.0 in | Wt 115.0 lb

## 2023-09-09 DIAGNOSIS — I10 Essential (primary) hypertension: Secondary | ICD-10-CM

## 2023-09-09 DIAGNOSIS — J4531 Mild persistent asthma with (acute) exacerbation: Secondary | ICD-10-CM | POA: Diagnosis not present

## 2023-09-09 DIAGNOSIS — R269 Unspecified abnormalities of gait and mobility: Secondary | ICD-10-CM

## 2023-09-09 DIAGNOSIS — W19XXXD Unspecified fall, subsequent encounter: Secondary | ICD-10-CM

## 2023-09-09 MED ORDER — ALBUTEROL SULFATE HFA 108 (90 BASE) MCG/ACT IN AERS: 2.0000 | INHALATION_SPRAY | RESPIRATORY_TRACT | 5 refills | Status: AC | PRN

## 2023-09-09 MED ORDER — DULERA 200-5 MCG/ACT IN AERO: 2.0000 | INHALATION_SPRAY | Freq: Two times a day (BID) | RESPIRATORY_TRACT | 5 refills | Status: DC

## 2023-09-09 NOTE — Assessment & Plan Note (Signed)
 No relapse  LBP from the fall has resolved

## 2023-09-09 NOTE — Assessment & Plan Note (Addendum)
 Better BP at home Cont on Diovan HCT 320/25, Clonidine twice daily prn. Continue Coreg 25 mg bid, Norvasc, Catapress patch

## 2023-09-09 NOTE — Progress Notes (Signed)
 Subjective:  Patient ID: Paula Griffith, female    DOB: [birth year]  Age: 78 y.o. MRN: [id number]  CC: Medical Management of Chronic Issues   HPI Paula Griffith presents for a fall in Feb 2025 - LBP from the fall has resolved F/u HTN, asthma, LBP. Needs Dulera renewed  Outpatient Medications Prior to Visit  Medication Sig Dispense Refill   acetaminophen (TYLENOL) 325 MG tablet Take 2 tablets (650 mg total) by mouth every 6 (six) hours as needed for mild pain (or Fever >/= 101). 20 tablet 0   anagrelide (AGRYLIN) 1 MG capsule Take 1 capsule (1 mg total) by mouth See admin instructions. TAKE 1 CAPSULE BY MOUTH DAILY EXCEPT 2 DAYS A WEEK (TUESDAYS AND THURSDAYS) DO NOT TAKE. 120 capsule 1   Ascorbic Acid (VITAMIN C) 1000 MG tablet Take 1,000 mg by mouth daily.     aspirin 325 MG EC tablet Take 325 mg by mouth daily after breakfast.     Cholecalciferol (VITAMIN D3) 2000 units TABS Take 2,000 Units by mouth daily.     gabapentin (NEURONTIN) 100 MG capsule Take 1 capsule (100 mg total) by mouth 3 (three) times daily as needed. 270 capsule 1   hydrochlorothiazide (MICROZIDE) 12.5 MG capsule Take 12.5 mg by mouth daily.     HYDROcodone-acetaminophen (NORCO/VICODIN) 5-325 MG tablet 1/2 - 1 tab by mouth up to four times per day as needed for moderate to severe pain 30 tablet 0   Multiple Vitamins-Minerals (ADULT GUMMY PO) Take 2 tablets by mouth daily.     albuterol (PROAIR HFA) 108 (90 Base) MCG/ACT inhaler Inhale 2 puffs into the lungs every 4 (four) hours as needed. For shortness of breath 18 g 5   ondansetron (ZOFRAN) 4 MG tablet Take 1 tablet (4 mg total) by mouth every 6 (six) hours as needed for nausea. (Patient not taking: Reported on 09/08/2023) 20 tablet 0   potassium chloride SA (KLOR-CON M) 20 MEQ tablet Take 1 tablet (20 mEq total) by mouth 2 (two) times daily. Take 1 tab three time a day for one week then change to twice daily (Patient not taking: Reported on 08/11/2023) 90 tablet 0    silver sulfADIAZINE (SILVADENE) 1 % cream Apply topically daily. (Patient not taking: Reported on 09/08/2023) 50 g 0   trolamine salicylate (ASPERCREME) 10 % cream Apply 1 application  topically 2 (two) times daily as needed for muscle pain. (Patient not taking: Reported on 08/11/2023)     Fluticasone-Salmeterol (ADVAIR) 250-50 MCG/DOSE AEPB Please specify directions, refills and quantity (Patient not taking: Reported on 08/11/2023) 1 each 11   No facility-administered medications prior to visit.    ROS: Review of Systems  Constitutional:  Negative for activity change, appetite change, chills, fatigue and unexpected weight change.  HENT:  Negative for congestion, mouth sores and sinus pressure.   Eyes:  Negative for visual disturbance.  Respiratory:  Negative for cough and chest tightness.   Gastrointestinal:  Negative for abdominal pain and nausea.  Genitourinary:  Negative for difficulty urinating, frequency and vaginal pain.  Musculoskeletal:  Positive for arthralgias. Negative for back pain and gait problem.  Skin:  Negative for pallor and rash.  Neurological:  Negative for dizziness, tremors, weakness, numbness and headaches.  Hematological:  Bruises/bleeds easily.  Psychiatric/Behavioral:  Positive for decreased concentration. Negative for confusion, sleep disturbance and suicidal ideas. The patient is not nervous/anxious.     Objective:  BP (!) 158/92   Pulse 78   Temp 98.4  F (36.9 C) (Oral)   Ht 5\' 3"  (1.6 m)   Wt 115 lb (52.2 kg)   SpO2 98%   BMI 20.37 kg/m   BP Readings from Last 3 Encounters:  09/09/23 (!) 158/92  08/04/23 (!) 160/94  07/22/23 (!) 180/110    Wt Readings from Last 3 Encounters:  09/09/23 115 lb (52.2 kg)  08/04/23 117 lb (53.1 kg)  07/22/23 118 lb 4.8 oz (53.7 kg)    Physical Exam Constitutional:      General: She is not in acute distress.    Appearance: Normal appearance. She is well-developed.  HENT:     Head: Normocephalic.     Right Ear:  External ear normal.     Left Ear: External ear normal.     Nose: Nose normal.  Eyes:     General:        Right eye: No discharge.        Left eye: No discharge.     Conjunctiva/sclera: Conjunctivae normal.     Pupils: Pupils are equal, round, and reactive to light.  Neck:     Thyroid: No thyromegaly.     Vascular: No JVD.     Trachea: No tracheal deviation.  Cardiovascular:     Rate and Rhythm: Normal rate and regular rhythm.     Heart sounds: Normal heart sounds.  Pulmonary:     Effort: No respiratory distress.     Breath sounds: No stridor. No wheezing.  Abdominal:     General: Bowel sounds are normal. There is no distension.     Palpations: Abdomen is soft. There is no mass.     Tenderness: There is no abdominal tenderness. There is no guarding or rebound.  Musculoskeletal:        General: No tenderness.     Cervical back: Normal range of motion and neck supple. No rigidity.  Lymphadenopathy:     Cervical: No cervical adenopathy.  Skin:    Findings: No erythema or rash.  Neurological:     Mental Status: Mental status is at baseline.     Cranial Nerves: No cranial nerve deficit.     Motor: No weakness or abnormal muscle tone.     Coordination: Coordination normal.     Gait: Gait abnormal.     Deep Tendon Reflexes: Reflexes normal.  Psychiatric:        Behavior: Behavior normal.        Thought Content: Thought content normal.        Judgment: Judgment normal.     Lab Results  Component Value Date   WBC 5.5 07/22/2023   HGB 9.5 (L) 07/22/2023   HCT 25.8 (L) 07/22/2023   PLT  07/22/2023    PLATELET CLUMPS NOTED ON SMEAR, COUNT APPEARS ADEQUATE   GLUCOSE 90 07/22/2023   CHOL 158 03/01/2018   TRIG 84.0 03/01/2018   HDL 47.10 03/01/2018   LDLCALC 94 03/01/2018   ALT 5 07/22/2023   AST 23 07/22/2023   NA 122 (L) 07/22/2023   K 2.9 (L) 07/22/2023   CL 80 (L) 07/22/2023   CREATININE 0.91 07/22/2023   BUN 10 07/22/2023   CO2 32 07/22/2023   TSH 1.975  09/03/2022   INR 1.0 09/01/2022   HGBA1C 5.4 04/28/2021    DG Chest Port 1 View Result Date: 12/29/2022 CLINICAL DATA:  Chest pain, hypertension EXAM: PORTABLE CHEST 1 VIEW COMPARISON:  12/25/2022 FINDINGS: The heart size and mediastinal contours are within normal limits. Both lungs are  clear. The visualized skeletal structures are unremarkable. IMPRESSION: Negative. Electronically Signed   By: Charlett Nose M.D.   On: 12/29/2022 17:45    Assessment & Plan:   Problem List Items Addressed This Visit     Asthma   Continue on Symbicort.  Use a rescue inhaler as needed-Ventolin      Relevant Medications   mometasone-formoterol (DULERA) 200-5 MCG/ACT AERO   albuterol (PROAIR HFA) 108 (90 Base) MCG/ACT inhaler   Fall - Primary   No relapse  LBP from the fall has resolved      Gait disorder   Using a cane  LBP from the fall has resolved      HTN (hypertension), malignant   Better BP at home Cont on Diovan HCT 320/25, Clonidine twice daily prn. Continue Coreg 25 mg bid, Norvasc, Catapress patch          Meds ordered this encounter  Medications   mometasone-formoterol (DULERA) 200-5 MCG/ACT AERO    Sig: Inhale 2 puffs into the lungs 2 (two) times daily.    Dispense:  1 each    Refill:  5   albuterol (PROAIR HFA) 108 (90 Base) MCG/ACT inhaler    Sig: Inhale 2 puffs into the lungs every 4 (four) hours as needed for wheezing. For shortness of breath    Dispense:  8 g    Refill:  5      Follow-up: Return in about 3 months (around 12/10/2023) for a follow-up visit.  Sonda Primes, MD

## 2023-09-09 NOTE — Assessment & Plan Note (Signed)
 Using a cane  LBP from the fall has resolved

## 2023-09-09 NOTE — Assessment & Plan Note (Signed)
Continue on Symbicort.  Use a rescue inhaler as needed-Ventolin

## 2023-09-14 ENCOUNTER — Ambulatory Visit: Payer: Self-pay

## 2023-09-14 NOTE — Patient Outreach (Unsigned)
 Care Coordination   Follow Up Visit Note   09/15/2023 Name: Paula Griffith MRN: [id number] DOB: 10-01-1945  Paula Griffith is a 78 y.o. year old female who sees [name], Georgina Quint, MD for primary care. I spoke with  [name] by phone today.  What matters to the patients health and wellness today?  Patient reports follow up visit with PCP on 09/09/23. Patient reports she took BP monitor and medications with her to PCP visit on 09/09/23. Per review of chart: discrepancy noted between MD note, patient's AVS dated 09/09/23 and listed allergies. Message to PCP and clinical pharmacist to notify/request clarification and medication reconciliation.   Goals Addressed             This Visit's Progress    Continue to maintain and/or improved health       Interventions Today    Flowsheet Row Most Recent Value  Chronic Disease   Chronic disease during today's visit Hypertension (HTN)  General Interventions   General Interventions Discussed/Reviewed General Interventions Reviewed, Communication with  Doctor Visits Discussed/Reviewed PCP  PCP/Specialist Visits Compliance with follow-up visit  [reviewed upcoming appointment with patient]  Communication with Pharmacists, PCP/Specialists  [PCP and clinical pharmacist regarding medication discrepancey between PCP note, AVS and listed allergies]  Education Interventions   Provided Verbal Education On When to see the doctor, Medication, Other  [advised patient to continue to check and record BP, take medications as prescribed, eat healthy, contact provider with health questions/concerns as needed.]  Pharmacy Interventions   Pharmacy Dicussed/Reviewed Pharmacy Topics Reviewed  Precious Haws to clinical pharmacy and PCP regarding descrepancy: AVS and pcp note]            SDOH assessments and interventions completed:  No  Care Coordination Interventions:  Yes, provided   Follow up plan: Follow up call scheduled for 10/19/23    Encounter Outcome:   Patient Visit Completed   Kathyrn Sheriff, RN, MSN, BSN, CCM  [place]  [facility], Population Health Case Manager Phone: [phone]

## 2023-09-15 NOTE — Patient Instructions (Signed)
 Visit Information  Thank you for taking time to visit with me today. Please don't hesitate to contact me if I can be of assistance to you.   Following are the goals we discussed today:  Continue to take medications as prescribed. Continue to attend provider visits as scheduled Continue to eat healthy, lean meats, vegetables, fruits, avoid saturated and transfats Contact provider with health questions or concerns as needed Continue to check blood pressure and record BP and contact provider if questions or concerns   Our next appointment is by telephone on 10/18/23 at 10:30 am  Please call the care guide team at [phone] if you need to cancel or reschedule your appointment.   If you are experiencing a Mental Health or Behavioral Health Crisis or need someone to talk to, please call the Suicide and Crisis Lifeline: 988 call the [place] National Suicide Prevention Lifeline: 225 433 0342 or TTY: 435 503 1994 TTY (205)664-1604) to talk to a trained counselor   Kathyrn Sheriff, RN, MSN, BSN, CCM [place]  [facility], Population Health Case Manager Phone: [phone]

## 2023-09-16 ENCOUNTER — Telehealth: Payer: Self-pay

## 2023-09-16 NOTE — Patient Outreach (Signed)
 Care Coordination   Follow Up Visit Note   09/16/2023 Name: Paula Griffith MRN: [id number] DOB: Oct 07, 1945  HARRY BARK is a 78 y.o. year old female who sees [name], Georgina Quint, MD for primary care. I spoke with  [name] by phone today.  Care Coordination: RNCM received clarification from clinical pharmacist, Stefani Dama, regarding BP, that patient is "has only been on hydrochlorothiazide". RNCM contacted patient and reinforced to continue to take medications per PCP visit and AVS summary. RNCM advised patient to continue to check BP and contact provider if elevated or with questions or concerns.    Goals Addressed             This Visit's Progress    Continue to maintain and/or improved health       Interventions Today    Flowsheet Row Most Recent Value  Chronic Disease   Chronic disease during today's visit Hypertension (HTN)  General Interventions   General Interventions Discussed/Reviewed General Interventions Reviewed  [reiterated with patient to continue to take medications per MD visit/AVS,  continue to monitor and record BP and notify provider if BP elevated or with questions or concerns. Goal <140/90 or per provider recommendation.]            SDOH assessments and interventions completed:  No  Care Coordination Interventions:  Yes, provided   Follow up plan:  as previously scheduled    Encounter Outcome:  Patient Visit Completed   Kathyrn Sheriff, RN, MSN, BSN, CCM [place]  [facility], Population Health Case Manager Phone: [phone]

## 2023-09-16 NOTE — Patient Instructions (Addendum)
 Visit Information  Thank you for taking time to visit with me today. Please don't hesitate to contact me if I can be of assistance to you.   Following are the goals we discussed today:  Continue to take medications as prescribed Continue to monitor and record Blood Pressure. Notify provider if elevated or with questions or concerns. Target Blood Pressure less than 140/90 or per Provider recommendation.   Our next appointment is by telephone on 10/18/23 at 10:00 am.  Please call the care guide team at [phone] if you need to cancel or reschedule your appointment.   If you are experiencing a Mental Health or Behavioral Health Crisis or need someone to talk to, please call the Suicide and Crisis Lifeline: 988 call the [place] National Suicide Prevention Lifeline: 915 307 4231 or TTY: 4697362050 TTY (657)[phone]) to talk to a trained counselor  Kathyrn Sheriff, RN, MSN, BSN, CCM   [facility], Population Health Case Manager Phone: [phone]

## 2023-10-21 ENCOUNTER — Inpatient Hospital Stay: Payer: Medicare PPO | Attending: Hematology

## 2023-10-21 DIAGNOSIS — D649 Anemia, unspecified: Secondary | ICD-10-CM

## 2023-10-21 DIAGNOSIS — D751 Secondary polycythemia: Secondary | ICD-10-CM | POA: Insufficient documentation

## 2023-10-21 DIAGNOSIS — D471 Chronic myeloproliferative disease: Secondary | ICD-10-CM | POA: Insufficient documentation

## 2023-10-21 LAB — CBC WITH DIFFERENTIAL (CANCER CENTER ONLY)
Abs Immature Granulocytes: 0.01 10*3/uL (ref 0.00–0.07)
Basophils Absolute: 0 10*3/uL (ref 0.0–0.1)
Basophils Relative: 1 %
Eosinophils Absolute: 0 10*3/uL (ref 0.0–0.5)
Eosinophils Relative: 0 %
HCT: 29.2 % — ABNORMAL LOW (ref 36.0–46.0)
Hemoglobin: 10.1 g/dL — ABNORMAL LOW (ref 12.0–15.0)
Immature Granulocytes: 0 %
Lymphocytes Relative: 25 %
Lymphs Abs: 1.3 10*3/uL (ref 0.7–4.0)
MCH: 31.1 pg (ref 26.0–34.0)
MCHC: 34.6 g/dL (ref 30.0–36.0)
MCV: 89.8 fL (ref 80.0–100.0)
Monocytes Absolute: 0.4 10*3/uL (ref 0.1–1.0)
Monocytes Relative: 8 %
Neutro Abs: 3.3 10*3/uL (ref 1.7–7.7)
Neutrophils Relative %: 66 %
Platelet Count: 285 10*3/uL (ref 150–400)
RBC: 3.25 MIL/uL — ABNORMAL LOW (ref 3.87–5.11)
RDW: 11.3 % — ABNORMAL LOW (ref 11.5–15.5)
WBC Count: 5 10*3/uL (ref 4.0–10.5)
nRBC: 0 % (ref 0.0–0.2)

## 2023-10-21 LAB — CMP (CANCER CENTER ONLY)
ALT: <5 U/L (ref 0–44)
AST: 15 U/L (ref 15–41)
Albumin: 4.1 g/dL (ref 3.5–5.0)
Alkaline Phosphatase: 48 U/L (ref 38–126)
Anion gap: 7 (ref 5–15)
BUN: 12 mg/dL (ref 8–23)
CO2: 25 mmol/L (ref 22–32)
Calcium: 9.6 mg/dL (ref 8.9–10.3)
Chloride: 102 mmol/L (ref 98–111)
Creatinine: 0.91 mg/dL (ref 0.44–1.00)
GFR, Estimated: >60 mL/min (ref >60)
Glucose, Bld: 99 mg/dL (ref 70–99)
Potassium: 4.3 mmol/L (ref 3.5–5.1)
Sodium: 134 mmol/L — ABNORMAL LOW (ref 135–145)
Total Bilirubin: 0.4 mg/dL (ref 0.0–1.2)
Total Protein: 7.1 g/dL (ref 6.5–8.1)

## 2023-10-21 LAB — IRON AND IRON BINDING CAPACITY (CC-WL,HP ONLY)
Iron: 46 ug/dL (ref 28–170)
Saturation Ratios: 14 % (ref 10.4–31.8)
TIBC: 329 ug/dL (ref 250–450)
UIBC: 283 ug/dL (ref 148–442)

## 2023-10-22 ENCOUNTER — Encounter

## 2023-11-05 ENCOUNTER — Telehealth: Payer: Self-pay

## 2023-11-09 ENCOUNTER — Telehealth: Payer: Self-pay

## 2023-11-26 ENCOUNTER — Telehealth: Payer: Self-pay

## 2023-12-13 ENCOUNTER — Ambulatory Visit: Admitting: Internal Medicine

## 2023-12-14 ENCOUNTER — Other Ambulatory Visit: Payer: Self-pay

## 2023-12-14 NOTE — Patient Outreach (Signed)
 Complex Care Management   Visit Note  12/14/2023  Name:  Paula Griffith MRN: [id number] DOB: [birth year]  Situation: Referral received for Complex Care Management related to HTN I obtained verbal consent from Patient.  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 She reports her daughter checked her BP last week-she does not remember reading, but states daughter states her BP was good. Patient reports prescription refill needs for hydrochlorothiazide  and Potassium(She states last taken  the weekend). She also reports she has to request a refill on Agrylin, Wixela and Albuterol  inhaler. Ms. Raynard Callander reports planned to get medications refill at appointment, but appointment was rescheduled per provider office and upcoming appointment scheduled for 12/17/23.  Patient Reported Symptoms:  Cognitive Cognitive Status: Normal speech and language skills, Insightful and able to interpret abstract concepts      Neurological Neurological Review of Symptoms: No symptoms reported    HEENT HEENT Symptoms Reported: No symptoms reported      Cardiovascular Cardiovascular Symptoms Reported: No symptoms reported    Respiratory Respiratory Symptoms Reported: No symptoms reported    Endocrine Patient reports the following symptoms related to hypoglycemia or hyperglycemia : No symptoms reported Is patient diabetic?:  No    Gastrointestinal Gastrointestinal Symptoms Reported: No symptoms reported      Genitourinary Genitourinary Symptoms Reported: No symptoms reported    Integumentary Integumentary Symptoms Reported: No symptoms reported    Musculoskeletal Musculoskelatal Symptoms Reviewed: Muscle pain Additional Musculoskeletal Details: reports when sitting or still a long time-patient reports, notices soreness to back of legs, but states it goes away when moving during the day. Musculoskeletal Conditions:  (hips down back of legs) Musculoskeletal Management Strategies: Activity, Medication therapy, Routine screening, Medical device (walking cane) Musculoskeletal Self-Management Outcome: 4 (good)      Psychosocial Psychosocial Symptoms Reported: No symptoms reported     Quality of Family Relationships: supportive      08/04/2023    4:26 PM  Depression screen PHQ 2/9  Decreased Interest 0  Down, Depressed, Hopeless 0  PHQ - 2 Score 0    There were no vitals filed for this visit.  Medications Reviewed Today     Reviewed by Attie Nawabi M, RN (Registered Nurse) on 12/14/23 at 1829  Med List Status: <None>   Medication Order Taking? Sig Documenting Provider Last Dose Status Informant  acetaminophen  (TYLENOL ) 325 MG tablet [id number] Yes Take 2 tablets (650 mg total) by mouth every 6 (six) hours as needed for mild pain (or Fever >/= 101). Aura Leeds Navarre, DO  Active Child           Med Note Brinton Canavan Mar 18, 2023  2:53 PM) PRN  albuterol  (PROAIR  HFA) 108 [phone] Base) MCG/ACT inhaler [id number] Yes Inhale 2  puffs into the lungs every 4 (four) hours as needed for wheezing. For shortness of breath [name], Oakley Bellman, MD  Active   anagrelide  (AGRYLIN) 1 MG capsule [id number]  Take 1 capsule (1 mg total) by mouth See admin instructions. TAKE 1 CAPSULE BY MOUTH DAILY EXCEPT 2 DAYS A WEEK (TUESDAYS AND THURSDAYS) DO NOT TAKE. Sonja [place], MD  Active   Ascorbic Acid  (VITAMIN C) 1000 MG  tablet [id number] Yes Take 1,000 mg by mouth daily. [provider]  Active Self  aspirin  325 MG EC tablet [id number] Yes Take 325 mg by mouth daily after breakfast. [provider]  Active Child  Cholecalciferol  (VITAMIN D3) 2000 units TABS [id number] Yes Take 2,000 Units by mouth daily. [provider]  Active Child           Med Note Lajuana Pilar, Jacky Massing   Wed Sep 08, 2023 11:46 AM) Reports has 1000IU/66mcg. takes one tablet daily  fluticasone -salmeterol (WIXELA INHUB) 100-50 MCG/ACT AEPB [id number] Yes Inhale 1 puff into the lungs 2 (two) times daily. [name], Aleksei V, MD  Active   gabapentin  (NEURONTIN ) 100 MG capsule [id number]  Take 1 capsule (100 mg total) by mouth 3 (three) times daily as needed. [name], Aleksei V, MD  Active   hydrochlorothiazide  (MICROZIDE ) 12.5 MG capsule [id number]  Take 12.5 mg by mouth daily. [provider]  Active   HYDROcodone -acetaminophen  (NORCO/VICODIN) 5-325 MG tablet [id number]  1/2 - 1 tab by mouth up to four times per day as needed for moderate to severe pain Roslyn Coom[place], MD  Active   Ibuprofen  (ADVIL ) 200 MG CAPS [id number] Yes Take 1 capsule by mouth daily as needed (as needed). [provider]  Active     Discontinued 06/28/19 1140   Multiple Vitamins-Minerals (ADULT GUMMY PO) [id number] Yes Take 2 tablets by mouth daily. [provider]  Active Child  Nutritional Supplements (ENSURE HIGH PROTEIN) LIQD [id number] Yes Take 1 Can by mouth daily. [provider]  Active   ondansetron  (ZOFRAN ) 4 MG tablet [id number] Yes Take 1 tablet (4 mg total) by mouth every 6 (six) hours as needed for nausea. Sheikh, Omair [place], DO  Active Child  potassium chloride  SA (KLOR-CON  M) 20 MEQ tablet [id number]  Take 1 tablet (20 mEq total) by mouth 2 (two) times daily. Take 1 tab three time a day for one week then change to twice daily Sonja [place], MD  Active   silver  sulfADIAZINE  (SILVADENE ) 1 % cream [id number]  Apply  topically daily. Aura Leeds Lena, DO  Active Child           Med Note Lajuana Pilar, Kealy Lewter M   Tue Sep 14, 2023  2:54 PM) Reports uses as needed  trolamine salicylate (ASPERCREME) 10 % cream [id number]  Apply 1 application  topically 2 (two) times daily as needed for muscle pain.  Patient not taking: Reported on 12/14/2023   [provider]  Active Child          Recommendation:   Patient to request refill from local pharmacy and take medications as prescribed Patient to attend upcoming appointment with PCP on 12/17/23  Follow Up Plan:   Telephone follow up appointment date/time:  12/23/23 at 10:00 am  Lindi Revering, RN, MSN, BSN, CCM [place]  [facility], Population Health Case Manager Phone: [phone]

## 2023-12-14 NOTE — Patient Instructions (Addendum)
 Visit Information  Thank you for taking time to visit with me today. Please don't hesitate to contact me if I can be of assistance to you before our next scheduled appointment.  Our next appointment is by telephone on 12/23/23 at 10:00 am Please call the care guide team at [phone] if you need to cancel or reschedule your appointment.   Following is a copy of your care plan:   Goals Addressed             This Visit's Progress    COMPLETED: Continue to maintain and/or improved health       Interventions Today    Flowsheet Row Most Recent Value  Chronic Disease   Chronic disease during today's visit Hypertension (HTN)  General Interventions   General Interventions Discussed/Reviewed General Interventions Reviewed  [reiterated with patient to continue to take medications per MD visit/AVS,  continue to monitor and record BP and notify provider if BP elevated or with questions or concerns. Goal <140/90 or per provider recommendation.]           VBCI RN Care Plan       Problems:  Chronic Disease Management support and education needs related to HTN  Goal: Over the next 90 days the Patient will attend all scheduled medical appointments: PCP visit as evidenced by patient report and review of chart        continue to work with RN Care Manager and/or Social Worker to address care management and care coordination needs related to HTN as evidenced by adherence to care management team scheduled appointments     take all medications exactly as prescribed and will call provider for medication related questions as evidenced by patient report and/or review of chart     Interventions:   Hypertension Interventions: Last practice recorded BP readings:  BP Readings from Last 3 Encounters:  09/09/23 (!) 158/92  08/04/23 (!) 160/94  07/22/23 (!) 180/110   Most recent eGFR/CrCl:  Lab Results  Component Value Date   EGFR >60 05/27/2017    No components found for: CRCL  Evaluation of  current treatment plan related to hypertension self management and patient's adherence to plan as established by provider Reviewed medications with patient and discussed importance of compliance Discussed plans with patient for ongoing care management follow up and provided patient with direct contact information for care management team Discussed complications of poorly controlled blood pressure such as heart disease, stroke, circulatory complications, vision complications, kidney impairment, sexual dysfunction Advised patient to contact local pharmacy to request refills on: Angrylin; albuterol  inhaler; hydrochlorothiazide ; Potassium and Wixela. RNCM sent message to PCP regarding refill needs.   Patient Self-Care Activities:  Attend all scheduled provider appointments Call pharmacy for medication refills 3-7 days in advance of running out of medications Call provider office for new concerns or questions  Perform all self care activities independently  Take medications as prescribed   keep a blood pressure log take blood pressure log to all doctor appointments take medications for blood pressure exactly as prescribed  Plan:  Telephone follow up appointment with care management team member scheduled for:  12/23/23 at 10:00 am           Please call the Suicide and Crisis Lifeline: 988 call the [place]  National Suicide Prevention Lifeline: 314-403-6802 or TTY: 212 236 3267 TTY (365)837-1079) to talk to a trained counselor if you are experiencing a Mental Health or Behavioral Health Crisis or need someone to talk to.  Patient verbalizes understanding of instructions and  care plan provided today and agrees to view in MyChart. Active MyChart status and patient understanding of how to access instructions and care plan via MyChart confirmed with patient.     Lindi Revering, RN, MSN, BSN, CCM [place]  [facility], Population Health Case Manager Phone: [phone]

## 2023-12-17 ENCOUNTER — Other Ambulatory Visit: Payer: Self-pay | Admitting: Internal Medicine

## 2023-12-17 ENCOUNTER — Ambulatory Visit: Admitting: Internal Medicine

## 2023-12-17 ENCOUNTER — Other Ambulatory Visit: Payer: Self-pay | Admitting: Hematology

## 2023-12-19 ENCOUNTER — Other Ambulatory Visit: Payer: Self-pay | Admitting: Internal Medicine

## 2023-12-19 MED ORDER — HYDROCHLOROTHIAZIDE 12.5 MG PO CAPS: 12.5000 mg | ORAL_CAPSULE | Freq: Every day | ORAL | 3 refills | Status: AC

## 2023-12-19 MED ORDER — POTASSIUM CHLORIDE CRYS ER 20 MEQ PO TBCR: 20.0000 meq | EXTENDED_RELEASE_TABLET | Freq: Every day | ORAL | 3 refills | Status: AC

## 2023-12-23 ENCOUNTER — Ambulatory Visit: Admitting: Internal Medicine

## 2023-12-23 ENCOUNTER — Other Ambulatory Visit: Payer: Self-pay

## 2023-12-23 NOTE — Patient Outreach (Signed)
 Complex Care Management   Visit Note  12/23/2023  Name:  Paula Griffith MRN: [id number] DOB: 08-27-1945  Situation: Referral received for Complex Care Management related to HTN I obtained verbal consent from Patient.  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 RNCM completed conference call to reschedule patient's follow up appointment.  Patient Reported Symptoms: Cognitive Cognitive Status: Alert and oriented to person, place, and time, Normal speech and language skills, Insightful and able to interpret abstract concepts      Neurological Neurological Review of Symptoms: No symptoms reported    HEENT HEENT Symptoms Reported: No symptoms reported      Cardiovascular Cardiovascular Symptoms Reported: No symptoms reported Does patient have uncontrolled Hypertension?: Yes Is patient checking Blood Pressure at home?: Yes Patient's Recent BP reading at home: patient reports family checks her BP and records, but she is unable to tell RNCM readings. RNCM encouraged patient to take BP reading to upcoming appointment next week.    Respiratory Respiratory Symptoms Reported: No symptoms reported    Endocrine Patient reports the following symptoms related to hypoglycemia or hyperglycemia : No symptoms reported    Gastrointestinal Gastrointestinal Symptoms Reported: No symptoms  reported      Genitourinary Genitourinary Symptoms Reported: No symptoms reported    Integumentary Integumentary Symptoms Reported: No symptoms reported    Musculoskeletal Musculoskelatal Symptoms Reviewed: No symptoms reported        Psychosocial Psychosocial Symptoms Reported: No symptoms reported            08/04/2023    4:26 PM  Depression screen PHQ 2/9  Decreased Interest 0  Down, Depressed, Hopeless 0  PHQ - 2 Score 0    There were no vitals filed for this visit.  Medications Reviewed Today     Reviewed by Madeliene Tejera M, RN (Registered Nurse) on 12/23/23 at 1029  Med List Status: <None>   Medication Order Taking? Sig Documenting Provider Last Dose Status Informant  acetaminophen  (TYLENOL ) 325 MG tablet [id number] Yes Take 2 tablets (650 mg total) by mouth every 6 (six) hours as needed for mild pain (or Fever >/= 101). Sherrill Cable [place], DO  Active Child           Med Note VIOLETTA, CRISTINA R   Thu Mar 18, 2023  2:53 PM) PRN  albuterol  (PROAIR  HFA) 108 (90 Base) MCG/ACT inhaler [id number] Yes Inhale 2 puffs into the lungs every 4 (four) hours as needed for wheezing. For shortness of breath [name], [name], MD  Active   anagrelide  (AGRYLIN) 1 MG capsule [id number] Yes Take 1 capsule (1 mg total) by mouth See admin instructions. TAKE 1 CAPSULE BY MOUTH DAILY EXCEPT 2 DAYS A WEEK (TUESDAYS AND THURSDAYS) DO NOT TAKE. Lanny Callander, MD  Active  Ascorbic Acid  (VITAMIN C) 1000 MG tablet [id number] Yes Take 1,000 mg by mouth daily. [provider]  Active Self  aspirin  325 MG EC tablet [id number] Yes Take 325 mg by mouth daily after breakfast. [provider]  Active Child  Cholecalciferol  (VITAMIN D3) 2000 units TABS [id number] Yes Take 2,000 Units by mouth daily. [provider]  Active Child           Med Note KARLYNN, HEDDY HERO   Wed Sep 08, 2023 11:46 AM) Reports has 1000IU/25mcg. takes one tablet daily  fluticasone -salmeterol (WIXELA INHUB)  100-50 MCG/ACT AEPB [id number] Yes Inhale 1 puff into the lungs 2 (two) times daily. [name], Aleksei V, MD  Active   gabapentin  (NEURONTIN ) 100 MG capsule [id number]  Take 1 capsule (100 mg total) by mouth 3 (three) times daily as needed. [name], Aleksei V, MD  Active   hydrochlorothiazide  (MICROZIDE ) 12.5 MG capsule [id number] Yes Take 1 capsule (12.5 mg total) by mouth daily. [name], Aleksei V, MD  Active   HYDROcodone -acetaminophen  (NORCO/VICODIN) 5-325 MG tablet [id number]  1/2 - 1 tab by mouth up to four times per day as needed for moderate to severe pain  Patient not taking: Reported on 12/23/2023   Norleen Lynwood ORN, MD  Active   Ibuprofen  (ADVIL ) 200 MG CAPS [id number]  Take 1 capsule by mouth daily as needed (as needed).  Patient not taking: Reported on 12/23/2023   [provider]  Active     Discontinued 06/28/19 1140   Multiple Vitamins-Minerals (ADULT GUMMY PO) [id number] Yes Take 2 tablets by mouth daily. [provider]  Active Child  Nutritional Supplements (ENSURE HIGH PROTEIN) LIQD [id number] Yes Take 1 Can by mouth daily. [provider]  Active   ondansetron  (ZOFRAN ) 4 MG tablet [id number] Yes Take 1 tablet (4 mg total) by mouth every 6 (six) hours as needed for nausea. Sheikh, Omair Floyd, DO  Active Child  potassium chloride  SA (KLOR-CON  M) 20 MEQ tablet [id number] Yes Take 1 tablet (20 mEq total) by mouth daily. [name], Aleksei V, MD  Active   silver  sulfADIAZINE  (SILVADENE ) 1 % cream [id number]  Apply topically daily.  Patient not taking: Reported on 12/23/2023   Sherrill Alejandro [place], DO  Active Child           Med Note KARLYNN, Denijah Karrer M   Tue Sep 14, 2023  2:54 PM) Reports uses as needed  trolamine salicylate (ASPERCREME) 10 % cream [id number]  Apply 1 application  topically 2 (two) times daily as needed for muscle pain.  Patient not taking: Reported on 12/23/2023   [provider]  Active Child          Recommendation:   PCP Follow-up  Schedule on 12/30/23 at 10:40 am  Follow Up Plan:   Telephone follow up appointment date/time:  01/25/24 at 11:30  HEDDY Shutter, RN, MSN, BSN, CCM [place]  [facility], Population Health Case Manager Phone: [phone]

## 2023-12-23 NOTE — Patient Instructions (Signed)
 Visit Information  Thank you for taking time to visit with me today. Please don't hesitate to contact me if I can be of assistance to you before our next scheduled appointment.  Our next appointment is by telephone on 01/25/24 at 11:30 am. Please call the care guide team at [phone] if you need to cancel or reschedule your appointment.   Following is a copy of your care plan:   Goals Addressed             This Visit's Progress    VBCI RN Care Plan       Problems:  Chronic Disease Management support and education needs related to HTN  Goal: Over the next 90 days the Patient will attend all scheduled medical appointments: PCP visit as evidenced by patient report and review of chart        continue to work with RN Care Manager and/or Social Worker to address care management and care coordination needs related to HTN as evidenced by adherence to care management team scheduled appointments     take all medications exactly as prescribed and will call provider for medication related questions as evidenced by patient report and/or review of chart     Interventions:   Hypertension Interventions: Last practice recorded BP readings:  BP Readings from Last 3 Encounters:  09/09/23 (!) 158/92  08/04/23 (!) 160/94  07/22/23 (!) 180/110   Most recent eGFR/CrCl:  Lab Results  Component Value Date   EGFR >60 05/27/2017    No components found for: CRCL  Evaluation of current treatment plan related to hypertension self management and patient's adherence to plan as established by provider Reviewed medications with patient and discussed importance of compliance Discussed plans with patient for ongoing care management follow up and provided patient with direct contact information for care management team Discussed complications of poorly controlled blood pressure such as heart disease, stroke, circulatory complications, vision complications, kidney impairment, sexual dysfunction Completed  conference call to assist patient with rescheduling follow up visit with PCP. Reviewed other upcoming appointments with patient Advised patient to continue BP monitoring at home and take readings to provider at upcoming appointment  Patient Self-Care Activities:  Attend all scheduled provider appointments Call pharmacy for medication refills 3-7 days in advance of running out of medications Call provider office for new concerns or questions  Perform all self care activities independently  Take medications as prescribed   keep a blood pressure log take blood pressure log to all doctor appointments take medications for blood pressure exactly as prescribed  Plan:  Telephone follow up appointment with care management team member scheduled for:  01/25/24 at 11:30 am           Please call the Suicide and Crisis Lifeline: 988 call the [place]  National Suicide Prevention Lifeline: (787) 059-3606 or TTY: (959)481-0941 TTY (986)591-4544) to talk to a trained counselor if you are experiencing a Mental Health or Behavioral Health Crisis or need someone to talk to.  Patient verbalizes understanding of instructions and care plan provided today and agrees to view in MyChart. Active MyChart status and patient understanding of how to access instructions and care plan via MyChart confirmed with patient.     Heddy Shutter, RN, MSN, BSN, CCM [place]  [facility], Population Health Case Manager Phone: [phone]

## 2023-12-30 ENCOUNTER — Ambulatory Visit: Admitting: Internal Medicine

## 2023-12-30 ENCOUNTER — Encounter: Payer: Self-pay | Admitting: Internal Medicine

## 2023-12-30 VITALS — BP 166/90 | HR 88 | Temp 98.0°F | Ht 63.0 in | Wt 108.0 lb

## 2023-12-30 DIAGNOSIS — R634 Abnormal weight loss: Secondary | ICD-10-CM

## 2023-12-30 DIAGNOSIS — I739 Peripheral vascular disease, unspecified: Secondary | ICD-10-CM

## 2023-12-30 DIAGNOSIS — M150 Primary generalized (osteo)arthritis: Secondary | ICD-10-CM | POA: Diagnosis not present

## 2023-12-30 DIAGNOSIS — D471 Chronic myeloproliferative disease: Secondary | ICD-10-CM | POA: Diagnosis not present

## 2023-12-30 NOTE — Progress Notes (Signed)
 Subjective:  Patient ID: Paula Griffith, female    DOB: 08/28/1945  Age: 78 y.o. MRN: [id number]  CC: Medical Management of Chronic Issues   HPI Paula Griffith presents for HTN, CMF, ataxia, gait disorder   Outpatient Medications Prior to Visit  Medication Sig Dispense Refill   acetaminophen  (TYLENOL ) 325 MG tablet Take 2 tablets (650 mg total) by mouth every 6 (six) hours as needed for mild pain (or Fever >/= 101). 20 tablet 0   albuterol  (PROAIR  HFA) 108 (90 Base) MCG/ACT inhaler Inhale 2 puffs into the lungs every 4 (four) hours as needed for wheezing. For shortness of breath 8 g 5   anagrelide  (AGRYLIN) 1 MG capsule Take 1 capsule (1 mg total) by mouth See admin instructions. TAKE 1 CAPSULE BY MOUTH DAILY EXCEPT 2 DAYS A WEEK (TUESDAYS AND THURSDAYS) DO NOT TAKE. 120 capsule 1   Ascorbic Acid  (VITAMIN C) 1000 MG tablet Take 1,000 mg by mouth daily.     aspirin  325 MG EC tablet Take 325 mg by mouth daily after breakfast.     Cholecalciferol  (VITAMIN D3) 2000 units TABS Take 2,000 Units by mouth daily.     fluticasone -salmeterol (WIXELA INHUB) 100-50 MCG/ACT AEPB Inhale 1 puff into the lungs 2 (two) times daily. 1 each 5   gabapentin  (NEURONTIN ) 100 MG capsule Take 1 capsule (100 mg total) by mouth 3 (three) times daily as needed. 270 capsule 1   hydrochlorothiazide  (MICROZIDE ) 12.5 MG capsule Take 1 capsule (12.5 mg total) by mouth daily. 90 capsule 3   HYDROcodone -acetaminophen  (NORCO/VICODIN) 5-325 MG tablet 1/2 - 1 tab by mouth up to four times per day as needed for moderate to severe pain 30 tablet 0   Ibuprofen  (ADVIL ) 200 MG CAPS Take 1 capsule by mouth daily as needed (as needed).     Multiple Vitamins-Minerals (ADULT GUMMY PO) Take 2 tablets by mouth daily.     Nutritional Supplements (ENSURE HIGH PROTEIN) LIQD Take 1 Can by mouth daily.     ondansetron  (ZOFRAN ) 4 MG tablet Take 1 tablet (4 mg total) by mouth every 6 (six) hours as needed for nausea. 20 tablet 0   potassium  chloride SA (KLOR-CON  M) 20 MEQ tablet Take 1 tablet (20 mEq total) by mouth daily. 90 tablet 3   silver  sulfADIAZINE  (SILVADENE ) 1 % cream Apply topically daily. 50 g 0   trolamine salicylate (ASPERCREME) 10 % cream Apply 1 application  topically 2 (two) times daily as needed for muscle pain.     No facility-administered medications prior to visit.    ROS: Review of Systems  Constitutional:  Negative for activity change, appetite change, chills, fatigue and unexpected weight change.  HENT:  Negative for congestion, mouth sores and sinus pressure.   Eyes:  Negative for visual disturbance.  Respiratory:  Negative for cough and chest tightness.   Gastrointestinal:  Negative for abdominal pain and nausea.  Genitourinary:  Negative for difficulty urinating, frequency and vaginal pain.  Musculoskeletal:  Positive for arthralgias, back pain, gait problem and neck stiffness.  Skin:  Negative for pallor and rash.  Neurological:  Positive for weakness. Negative for dizziness, tremors, numbness and headaches.  Hematological:  Negative for adenopathy. Bruises/bleeds easily.  Psychiatric/Behavioral:  Negative for confusion, decreased concentration, sleep disturbance and suicidal ideas. The patient is not nervous/anxious.     Objective:  BP (!) 166/90 (BP Location: Left Arm, Patient Position: Sitting, Cuff Size: Normal)   Pulse 88   Temp 98 F (36.7  C) (Oral)   Ht 5' 3 (1.6 m)   Wt 108 lb (49 kg)   SpO2 99%   BMI 19.13 kg/m   BP Readings from Last 3 Encounters:  12/30/23 (!) 166/90  09/09/23 (!) 158/92  08/04/23 (!) 160/94    Wt Readings from Last 3 Encounters:  12/30/23 108 lb (49 kg)  09/09/23 115 lb (52.2 kg)  08/04/23 117 lb (53.1 kg)    Physical Exam Constitutional:      General: She is not in acute distress.    Appearance: Normal appearance. She is well-developed.  HENT:     Head: Normocephalic.     Right Ear: External ear normal.     Left Ear: External ear normal.      Nose: Nose normal.  Eyes:     General:        Right eye: No discharge.        Left eye: No discharge.     Conjunctiva/sclera: Conjunctivae normal.     Pupils: Pupils are equal, round, and reactive to light.  Neck:     Thyroid : No thyromegaly.     Vascular: No JVD.     Trachea: No tracheal deviation.  Cardiovascular:     Rate and Rhythm: Normal rate and regular rhythm.     Heart sounds: Normal heart sounds.  Pulmonary:     Effort: No respiratory distress.     Breath sounds: No stridor. No wheezing.  Abdominal:     General: Bowel sounds are normal. There is no distension.     Palpations: Abdomen is soft. There is no mass.     Tenderness: There is no abdominal tenderness. There is no guarding or rebound.  Musculoskeletal:        General: Tenderness present.     Cervical back: Normal range of motion and neck supple. No rigidity.     Right lower leg: No edema.     Left lower leg: No edema.  Lymphadenopathy:     Cervical: No cervical adenopathy.  Skin:    Findings: No erythema or rash.  Neurological:     Mental Status: Mental status is at baseline.     Cranial Nerves: No cranial nerve deficit.     Motor: No abnormal muscle tone.     Coordination: Coordination normal.     Deep Tendon Reflexes: Reflexes normal.  Psychiatric:        Behavior: Behavior normal.        Thought Content: Thought content normal.        Judgment: Judgment normal.   Thin Using a cane  Lab Results  Component Value Date   WBC 5.0 10/21/2023   HGB 10.1 (L) 10/21/2023   HCT 29.2 (L) 10/21/2023   PLT 285 10/21/2023   GLUCOSE 99 10/21/2023   CHOL 158 03/01/2018   TRIG 84.0 03/01/2018   HDL 47.10 03/01/2018   LDLCALC 94 03/01/2018   ALT <5 10/21/2023   AST 15 10/21/2023   NA 134 (L) 10/21/2023   K 4.3 10/21/2023   CL 102 10/21/2023   CREATININE 0.91 10/21/2023   BUN 12 10/21/2023   CO2 25 10/21/2023   TSH 1.975 09/03/2022   INR 1.0 09/01/2022   HGBA1C 5.4 04/28/2021    DG Chest Port 1  View Result Date: 12/29/2022 CLINICAL DATA:  Chest pain, hypertension EXAM: PORTABLE CHEST 1 VIEW COMPARISON:  12/25/2022 FINDINGS: The heart size and mediastinal contours are within normal limits. Both lungs are clear. The visualized skeletal structures  are unremarkable. IMPRESSION: Negative. Electronically Signed   By: Franky Crease M.D.   On: 12/29/2022 17:45    Assessment & Plan:   Problem List Items Addressed This Visit     PVD (peripheral vascular disease) (HCC)   Chronic  ASA Declined statins      Osteoarthritis   Not on Rx      MPN (myeloproliferative neoplasm) (HCC) (Chronic)   F/u w/Dr Lanny Leos at [facility]      Weight loss - Primary   Wt Readings from Last 3 Encounters:  12/30/23 108 lb (49 kg)  09/09/23 115 lb (52.2 kg)  08/04/23 117 lb (53.1 kg)  Use Ensure complete Labs/Xrays at [facility]           No orders of the defined types were placed in this encounter.     Follow-up: Return in about 4 months (around 05/01/2024) for a follow-up visit.  Marolyn Noel, MD

## 2023-12-30 NOTE — Assessment & Plan Note (Signed)
 Not on Rx

## 2023-12-30 NOTE — Assessment & Plan Note (Addendum)
 Wt Readings from Last 3 Encounters:  12/30/23 108 lb (49 kg)  09/09/23 115 lb (52.2 kg)  08/04/23 117 lb (53.1 kg)  Use Ensure complete Labs/Xrays at [facility]

## 2023-12-30 NOTE — Assessment & Plan Note (Signed)
 F/u w/Dr Lanny Leos at [facility]

## 2023-12-30 NOTE — Assessment & Plan Note (Signed)
 Chronic  ASA Declined statins

## 2024-01-03 ENCOUNTER — Ambulatory Visit (INDEPENDENT_AMBULATORY_CARE_PROVIDER_SITE_OTHER): Payer: Medicare PPO

## 2024-01-03 VITALS — Ht 62.0 in | Wt 108.0 lb

## 2024-01-03 DIAGNOSIS — Z0000 Encounter for general adult medical examination without abnormal findings: Secondary | ICD-10-CM | POA: Diagnosis not present

## 2024-01-03 DIAGNOSIS — Z1159 Encounter for screening for other viral diseases: Secondary | ICD-10-CM

## 2024-01-03 DIAGNOSIS — Z780 Asymptomatic menopausal state: Secondary | ICD-10-CM

## 2024-01-03 DIAGNOSIS — Z0100 Encounter for examination of eyes and vision without abnormal findings: Secondary | ICD-10-CM

## 2024-01-03 NOTE — Progress Notes (Cosign Needed Addendum)
 Subjective:   Paula Griffith is a 78 y.o. who presents for a Medicare Wellness preventive visit.  As a reminder, Annual Wellness Visits don't include a physical exam, and some assessments may be limited, especially if this visit is performed virtually. We may recommend an in-person follow-up visit with your provider if needed.  Visit Complete: Virtual I connected with  [name] on 01/03/24 by a audio enabled telemedicine application and verified that I am speaking with the correct person using two identifiers.  Patient Location: Home  Provider Location: Office/Clinic  I discussed the limitations of evaluation and management by telemedicine. The patient expressed understanding and agreed to proceed.  Vital Signs: Because this visit was a virtual/telehealth visit, some criteria may be missing or patient reported. Any vitals not documented were not able to be obtained and vitals that have been documented are patient reported.  VideoDeclined- This patient declined Librarian, academic. Therefore the visit was completed with audio only.  Persons Participating in Visit: Patient.  AWV Questionnaire: No: Patient Medicare AWV questionnaire was not completed prior to this visit.  Cardiac Risk Factors include: advanced age (>63men, >31 women);hypertension     Objective:    Today's Vitals   01/03/24 1511  Weight: 108 lb (49 kg)  Height: 5' 2 (1.575 m)   Body mass index is 19.75 kg/m.     01/03/2024    3:10 PM 12/29/2022    4:41 PM 12/29/2022    3:05 PM 09/01/2022   10:27 PM 09/01/2022    4:05 PM 04/26/2022   11:14 AM 01/14/2022    8:55 PM  Advanced Directives  Does Patient Have a Medical Advance Directive? Yes No No No No No No  Type of Estate agent of [place];Living will        Copy of Healthcare Power of Attorney in Chart? No - copy requested        Would patient like information on creating a medical advance directive?   No -  Patient declined Yes (Inpatient - patient requests chaplain consult to create a medical advance directive)  No - Patient declined No - Patient declined    Current Medications (verified) Outpatient Encounter Medications as of 01/03/2024  Medication Sig   acetaminophen  (TYLENOL ) 325 MG tablet Take 2 tablets (650 mg total) by mouth every 6 (six) hours as needed for mild pain (or Fever >/= 101).   albuterol  (PROAIR  HFA) 108 (90 Base) MCG/ACT inhaler Inhale 2 puffs into the lungs every 4 (four) hours as needed for wheezing. For shortness of breath   anagrelide  (AGRYLIN) 1 MG capsule Take 1 capsule (1 mg total) by mouth See admin instructions. TAKE 1 CAPSULE BY MOUTH DAILY EXCEPT 2 DAYS A WEEK (TUESDAYS AND THURSDAYS) DO NOT TAKE.   Ascorbic Acid  (VITAMIN C) 1000 MG tablet Take 1,000 mg by mouth daily.   aspirin  325 MG EC tablet Take 325 mg by mouth daily after breakfast.   Cholecalciferol  (VITAMIN D3) 2000 units TABS Take 2,000 Units by mouth daily.   fluticasone -salmeterol (WIXELA INHUB) 100-50 MCG/ACT AEPB Inhale 1 puff into the lungs 2 (two) times daily.   gabapentin  (NEURONTIN ) 100 MG capsule Take 1 capsule (100 mg total) by mouth 3 (three) times daily as needed.   hydrochlorothiazide  (MICROZIDE ) 12.5 MG capsule Take 1 capsule (12.5 mg total) by mouth daily.   Multiple Vitamins-Minerals (ADULT GUMMY PO) Take 2 tablets by mouth daily.   Nutritional Supplements (ENSURE HIGH PROTEIN) LIQD Take 1  Can by mouth daily.   ondansetron  (ZOFRAN ) 4 MG tablet Take 1 tablet (4 mg total) by mouth every 6 (six) hours as needed for nausea.   potassium chloride  SA (KLOR-CON  M) 20 MEQ tablet Take 1 tablet (20 mEq total) by mouth daily.   silver  sulfADIAZINE  (SILVADENE ) 1 % cream Apply topically daily.   trolamine salicylate (ASPERCREME) 10 % cream Apply 1 application  topically 2 (two) times daily as needed for muscle pain.   HYDROcodone -acetaminophen  (NORCO/VICODIN) 5-325 MG tablet 1/2 - 1 tab by mouth up to four  times per day as needed for moderate to severe pain   Ibuprofen  (ADVIL ) 200 MG CAPS Take 1 capsule by mouth daily as needed (as needed). (Patient not taking: Reported on 01/03/2024)   [DISCONTINUED] losartan -hydrochlorothiazide  (HYZAAR) 100-25 MG tablet Take 1 tablet by mouth daily.   No facility-administered encounter medications on file as of 01/03/2024.    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   Spondylolisthesis of lumbar region    Thrombocytopathy [facility]) 2011   Dr Townsend   Past Surgical History:  Procedure Laterality Date   ABDOMINAL HYSTERECTOMY  1989   ANTERIOR CERVICAL DECOMP/DISCECTOMY FUSION N/A 09/22/2017   Procedure: ANTERIOR CERVICAL DECOMPRESSION AND FUSION CERVICAL THREE-FOUR.;  Surgeon: Joshua Alm RAMAN, MD;  Location: [facility] OR;  Service: Neurosurgery;  Laterality: N/A;  anterior   BACK SURGERY     BREAST EXCISIONAL BIOPSY Left    BREAST SURGERY     Left   LUMBAR LAMINECTOMY/DECOMPRESSION MICRODISCECTOMY Left 01/06/2018   Procedure: laminectomy Lumbar five -Sacral one - left, Lumbar two-Lumbar three - bilateral;  Surgeon: Joshua Alm RAMAN, MD;  Location: [facility] OR;  Service: Neurosurgery;  Laterality: Left;   Family History  Problem Relation Age of Onset   Pancreatic cancer Mother    Arthritis Mother        RA   Early death Father 47       MI   Coronary artery disease Other        Female 1st degree relative Female <60    Lung cancer Other    Diabetes Sister    Breast cancer Maternal Grandmother    Colon cancer Neg Hx    Stomach cancer Neg Hx    Social History   Socioeconomic History   Marital status: Widowed    Spouse name: Not on file   Number of children: Not on file   Years of education: Not on file   Highest education level: Not on file  Occupational History   Occupation: Retired    Comment: but wking  Tobacco Use   Smoking status: Never    Passive exposure: Past   Smokeless tobacco: Never  Vaping Use   Vaping status: Never Used  Substance and Sexual Activity   Alcohol use: No    Alcohol/week: 0.0 standard drinks of alcohol   Drug  use: No   Sexual activity: Not Currently  Other Topics Concern   Not on file  Social History Narrative   Not on file   Social Drivers of Health   Financial Resource Strain: Low Risk  (01/03/2024)   Overall Financial Resource Strain (CARDIA)    Difficulty of Paying Living Expenses: Not hard at all  Food Insecurity: No Food Insecurity (01/03/2024)   Hunger Vital Sign    Worried About Running Out of Food in the Last Year: Never true    Ran Out of Food in the Last Year: Never true  Transportation Needs: No Transportation Needs (01/03/2024)   PRAPARE - Administrator, Civil Service (Medical): No    Lack of Transportation (Non-Medical): No  Physical Activity: Sufficiently Active (01/03/2024)   Exercise Vital Sign    Days of Exercise per Week: 7 days    Minutes of Exercise per Session: 60 min  Stress: No Stress Concern Present (01/03/2024)   [employer] of Occupational Health - Occupational Stress Questionnaire    Feeling of Stress: Not at all  Social Connections: Moderately Integrated (01/03/2024)   Social Connection and Isolation Panel    Frequency of Communication with Friends and Family: More than three times a week    Frequency of Social Gatherings with Friends and Family: More than three times a week    Attends Religious Services: More than 4  times per year    Active Member of [employer] or Organizations: Yes    Attends Banker Meetings: More than 4 times per year    Marital Status: Widowed    Tobacco Counseling Counseling given: No    Clinical Intake:  Pre-visit preparation completed: Yes  Pain : No/denies pain     BMI - recorded: 19.75 Nutritional Status: BMI <19  Underweight Nutritional Risks: None Diabetes: No  Lab Results  Component Value Date   HGBA1C 5.4 04/28/2021   HGBA1C 5.4 03/10/2021   HGBA1C 6.8 (H) 08/26/2017     How often do you need to have someone help you when you read instructions, pamphlets, or other written materials from your doctor or pharmacy?: 1 - Never  Interpreter Needed?: No  Information entered by :: Verdie Saba, CMA   Activities of Daily Living     01/03/2024    3:15 PM  In your present state of health, do you have any difficulty performing the following activities:  Hearing? 0  Vision? 0  Difficulty concentrating or making decisions? 0  Walking or climbing stairs? 0  Dressing or bathing? 0  Doing errands, shopping? 0  Preparing Food and eating ? N  Using the Toilet? N  In the past six months, have you accidently leaked urine? N  Do you have problems with loss of bowel control? N  Managing your Medications? N  Managing your Finances? N  Housekeeping or managing your Housekeeping? N    Patient Care Team: [name], [name], MD as PCP - General (Internal Medicine) Eliza Lonni RAMAN, MD (Inactive) (Vascular Surgery) Nahser, Aleene PARAS, MD as Consulting Physician (Cardiology) Lanny Callander, MD as Consulting Physician (Hematology) Joshua Alm Hamilton, MD as Consulting Physician (Neurosurgery) [facility] as Consulting Physician (Ophthalmology) Merceda Lela SAUNDERS, [facility] (Pharmacist) Prentiss Heddy HERO, RN as [facility] Care Management Wallace, Juana M, RN  I have updated your Care Teams any recent Medical Services you may have received from  other providers in the past year.     Assessment:   This is a  routine wellness examination for [place].  Hearing/Vision screen Hearing Screening - Comments:: Denies hearing difficulties   Vision Screening - Comments:: Wears rx glasses - referral to an [facility]   Goals Addressed               This Visit's Progress     Patient Stated (pt-stated)        Patient stated that she plans to walk more and stay active.       Depression Screen     01/03/2024    3:17 PM 12/30/2023   11:00 AM 08/04/2023    4:26 PM 12/29/2022    3:40 PM 12/17/2022    2:39 PM 09/01/2022    3:11 PM 06/17/2022    1:45 PM  PHQ 2/9 Scores  PHQ - 2 Score 0 0 0 0 0 0 0  PHQ- 9 Score 1 0  0  0 0    Fall Risk     01/03/2024    3:16 PM 12/30/2023   11:00 AM 08/04/2023    4:26 PM 12/29/2022    3:06 PM 12/17/2022    2:40 PM  Fall Risk   Falls in the past year? 0 0 1 1 1   Number falls in past yr: 0 0 1 1 0  Injury with Fall? 0 0 0 1   Risk for fall due to : History of fall(s);Impaired balance/gait No Fall Risks History of fall(s) Impaired balance/gait   Follow up Falls evaluation completed;Falls prevention discussed Falls evaluation completed Falls evaluation completed Falls evaluation completed;Education provided;Falls prevention discussed     MEDICARE RISK AT HOME:  Medicare Risk at Home Any stairs in or around the home?: Yes (outside) If so, are there any without handrails?: No Home free of loose throw rugs in walkways, pet beds, electrical cords, etc?: Yes Adequate lighting in your home to reduce risk of falls?: Yes Life alert?: No Use of a cane, walker or w/c?: No Grab bars in the bathroom?: Yes Shower chair or bench in shower?: Yes Elevated toilet seat or a handicapped toilet?: No  TIMED UP AND GO:  Was the test performed?  No  Cognitive Function: 6CIT completed        01/03/2024    3:19 PM 12/29/2022    3:16 PM 09/29/2021    9:55 AM  6CIT Screen  What Year? 0 points 0 points 0 points  What  month? 0 points 0 points 0 points  What time? 0 points 0 points 0 points  Count back from 20 0 points 0 points 0 points  Months in reverse 0 points 4 points 0 points  Repeat phrase 0 points 0 points 0 points  Total Score 0 points 4 points 0 points    Immunizations Immunization History  Administered Date(s) Administered   Fluad Quad(high Dose 65+) 04/03/2019, 08/14/2020, 03/10/2021   Influenza Split 04/08/2011, 04/08/2012   Influenza, High Dose Seasonal PF 04/20/2016, 02/23/2017, 03/01/2018   Influenza,inj,Quad PF,6+ Mos 03/15/2013, 04/23/2014, 03/21/2015   Moderna SARS-COV2 Booster Vaccination 04/20/2020   Moderna Sars-Covid-2 Vaccination 08/21/2019, 09/19/2019   Pneumococcal Conjugate-13 08/24/2016   Pneumococcal Polysaccharide-23 12/09/2012   Tdap 04/20/2016    Screening Tests Health Maintenance  Topic Date Due   Hepatitis C Screening  Never done   DEXA SCAN  Never done   COVID-19 Vaccine (3 - Moderna risk series) 05/18/2020   Medicare Annual Wellness (AWV)  01/02/2025   DTaP/Tdap/Td (2 - Td or Tdap) 04/20/2026   Pneumococcal Vaccine: 50+ Years  Completed   Hepatitis B Vaccines  Aged Out   HPV VACCINES  Aged Out   Meningococcal B Vaccine  Aged Out   INFLUENZA VACCINE  Discontinued   Colonoscopy  Discontinued   Zoster Vaccines- Shingrix  Discontinued    Health Maintenance  Health Maintenance Due  Topic Date Due   Hepatitis C Screening  Never done   DEXA SCAN  Never done   COVID-19 Vaccine (3 - Moderna risk series) 05/18/2020   Health Maintenance Items Addressed:  DEXA ordered, Referral sent to Optometry/Ophthalmology, Labs Ordered: Hepatitis C Screening  Additional Screening:  Vision Screening: Recommended annual ophthalmology exams for early detection of glaucoma and other disorders of the eye. Would you like a referral to an eye doctor? Yes  - Referral to [facility]  Dental Screening: Recommended annual dental exams for proper oral hygiene  Community  Resource Referral / Chronic Care Management: CRR required this visit?  No   CCM required this visit?  No   Plan:    I have personally reviewed and noted the following in the patient's chart:   Medical and social history Use of alcohol, tobacco or illicit drugs  Current medications and supplements including opioid prescriptions. Patient is not currently taking opioid prescriptions. Functional ability and status Nutritional status Physical activity Advanced directives List of other physicians Hospitalizations, surgeries, and ER visits in previous 12 months Vitals Screenings to include cognitive, depression, and falls Referrals and appointments  In addition, I have reviewed and discussed with patient certain preventive protocols, quality metrics, and best practice recommendations. A written personalized care plan for preventive services as well as general preventive health recommendations were provided to patient.   Verdie CHRISTELLA Saba, CMA   01/03/2024   After Visit Summary: (MyChart) Due to this being a telephonic visit, the after visit summary with patients personalized plan was offered to patient via MyChart   Notes: Nothing significant to report at this time.  Medical screening examination/treatment/procedure(s) were performed by non-physician practitioner and as supervising physician I was immediately available for consultation/collaboration.  I agree with above. Karlynn Noel, MD

## 2024-01-03 NOTE — Patient Instructions (Addendum)
 Ms. Frogge , Thank you for taking time out of your busy schedule to complete your Annual Wellness Visit with me. I enjoyed our conversation and look forward to speaking with you again next year. I, as well as your care team,  appreciate your ongoing commitment to your health goals. Please review the following plan we discussed and let me know if I can assist you in the future. Your Game plan/ To Do List    Referrals: If you haven't heard from the office you've been referred to, please reach out to them at the phone provided.  Ordered a DEXA (bone) scan, a Hepatitis C Screening (Lab), and a referral to [facility] for an eye exam. Follow up Visits: Next Medicare AWV with our clinical staff: 01/03/2025  in-person appointment Have you seen your provider in the last 6 months (3 months if uncontrolled diabetes)? Yes Next Office Visit with your provider: 05/01/2024  Clinician Recommendations:  Aim for 30 minutes of exercise or brisk walking, 6-8 glasses of water, and 5 servings of fruits and vegetables each day.       This is a list of the screening recommended for you and due dates:  Health Maintenance  Topic Date Due   Hepatitis C Screening  Never done   DEXA scan (bone density measurement)  Never done   COVID-19 Vaccine (3 - Moderna risk series) 05/18/2020   Medicare Annual Wellness Visit  01/02/2025   DTaP/Tdap/Td vaccine (2 - Td or Tdap) 04/20/2026   Pneumococcal Vaccine for age over 96  Completed   Hepatitis B Vaccine  Aged Out   HPV Vaccine  Aged Out   Meningitis B Vaccine  Aged Out   Flu Shot  Discontinued   Colon Cancer Screening  Discontinued   Zoster (Shingles) Vaccine  Discontinued    Advanced directives: (Copy Requested) Please bring a copy of your health care power of attorney and living will to the office to be added to your chart at your convenience. You can mail to [facility] [address] [place], [place] [address] or email to  [email or web address] Advance Care Planning is important because it:  [x]  Makes sure you receive the medical care that is consistent with your values, goals, and preferences  [x]  It provides guidance to your family and loved ones and reduces their decisional burden about whether or not they are making the right decisions based on your wishes.  Follow the link provided in your after visit summary or read over the paperwork we have mailed to you to help you started getting your Advance Directives in place. If you need assistance in completing these, please reach out to us  so that we can help you!

## 2024-01-20 ENCOUNTER — Inpatient Hospital Stay (HOSPITAL_BASED_OUTPATIENT_CLINIC_OR_DEPARTMENT_OTHER): Payer: Medicare PPO | Admitting: Hematology

## 2024-01-20 ENCOUNTER — Inpatient Hospital Stay: Payer: Medicare PPO | Attending: Hematology

## 2024-01-20 VITALS — BP 229/95 | HR 87 | Temp 98.3°F | Resp 17 | Ht 62.0 in | Wt 110.1 lb

## 2024-01-20 DIAGNOSIS — D473 Essential (hemorrhagic) thrombocythemia: Secondary | ICD-10-CM | POA: Insufficient documentation

## 2024-01-20 DIAGNOSIS — D471 Chronic myeloproliferative disease: Secondary | ICD-10-CM

## 2024-01-20 DIAGNOSIS — D649 Anemia, unspecified: Secondary | ICD-10-CM

## 2024-01-20 LAB — CMP (CANCER CENTER ONLY)
ALT: 5 U/L (ref 0–44)
AST: 17 U/L (ref 15–41)
Albumin: 4 g/dL (ref 3.5–5.0)
Alkaline Phosphatase: 55 U/L (ref 38–126)
Anion gap: 7 (ref 5–15)
BUN: 17 mg/dL (ref 8–23)
CO2: 26 mmol/L (ref 22–32)
Calcium: 9.8 mg/dL (ref 8.9–10.3)
Chloride: 97 mmol/L — ABNORMAL LOW (ref 98–111)
Creatinine: 0.96 mg/dL (ref 0.44–1.00)
GFR, Estimated: >60 mL/min (ref >60)
Glucose, Bld: 80 mg/dL (ref 70–99)
Potassium: 4.5 mmol/L (ref 3.5–5.1)
Sodium: 130 mmol/L — ABNORMAL LOW (ref 135–145)
Total Bilirubin: 0.5 mg/dL (ref 0.0–1.2)
Total Protein: 7.3 g/dL (ref 6.5–8.1)

## 2024-01-20 LAB — CBC WITH DIFFERENTIAL (CANCER CENTER ONLY)
Abs Immature Granulocytes: 0.01 K/uL (ref 0.00–0.07)
Basophils Absolute: 0 K/uL (ref 0.0–0.1)
Basophils Relative: 1 %
Eosinophils Absolute: 0.1 K/uL (ref 0.0–0.5)
Eosinophils Relative: 2 %
HCT: 28.9 % — ABNORMAL LOW (ref 36.0–46.0)
Hemoglobin: 10.2 g/dL — ABNORMAL LOW (ref 12.0–15.0)
Immature Granulocytes: 0 %
Lymphocytes Relative: 26 %
Lymphs Abs: 1.5 K/uL (ref 0.7–4.0)
MCH: 31.8 pg (ref 26.0–34.0)
MCHC: 35.3 g/dL (ref 30.0–36.0)
MCV: 90 fL (ref 80.0–100.0)
Monocytes Absolute: 0.5 K/uL (ref 0.1–1.0)
Monocytes Relative: 9 %
Neutro Abs: 3.5 K/uL (ref 1.7–7.7)
Neutrophils Relative %: 62 %
Platelet Count: 280 K/uL (ref 150–400)
RBC: 3.21 MIL/uL — ABNORMAL LOW (ref 3.87–5.11)
RDW: 11.7 % (ref 11.5–15.5)
WBC Count: 5.7 K/uL (ref 4.0–10.5)
nRBC: 0 % (ref 0.0–0.2)

## 2024-01-20 LAB — IRON AND IRON BINDING CAPACITY (CC-WL,HP ONLY)
Iron: 89 ug/dL (ref 28–170)
Saturation Ratios: 27 % (ref 10.4–31.8)
TIBC: 333 ug/dL (ref 250–450)
UIBC: 244 ug/dL (ref 148–442)

## 2024-01-20 MED ORDER — ANAGRELIDE HCL 1 MG PO CAPS: 1.0000 mg | ORAL_CAPSULE | ORAL | 1 refills | Status: AC

## 2024-01-20 NOTE — Progress Notes (Signed)
 [facility]   Telephone:([phone] [phone]   Clinic Follow up Note   Patient Care Team: [name], [name], MD as PCP - General (Internal Medicine) Eliza Lonni RAMAN, MD (Inactive) (Vascular Surgery) Nahser, Aleene PARAS, MD (Inactive) as Consulting Physician (Cardiology) Lanny Callander, MD as Consulting Physician (Hematology) Joshua Alm Hamilton, MD as Consulting Physician (Neurosurgery) [facility] as Consulting Physician (Ophthalmology) Merceda Lela SAUNDERS, [facility] (Pharmacist) Prentiss Heddy HERO, RN as [facility] Management Wallace, Juana M, RN  Date of Service:  01/20/2024  CHIEF COMPLAINT: f/u of ET  CURRENT THERAPY:  Anagrelide  1 mg daily except Tuesdays and Thursdays  Oncology History   MPN (myeloproliferative neoplasm) (HCC) essential thrombocythemia, JAK2 mutation negative. -We previously reviewed the natural history of ET, most patients do well, major complication is thrombosis. -She was previously on Hydrea , switched to anagrelide  in 05/2015 due to anemia from Hydrea . -Her anagrelide  dose has been titrated as needed. We will monitor her EKG periodically. She is currently on Anagrelide  1mg  daily except Tuesdays/Thursdays.  -Labs overall stable. Plan to continue Anagrelide  1mg  once daily 5 days a week   Assessment & Plan Essential Thrombocythemia Essential thrombocythemia is well-managed with normal platelet count and stable hemoglobin at 10.2. Kidney and liver functions are within normal limits. - Refill anagrelide  prescription at [facility] on [facility]. - Continue current anagrelide  regimen of one capsule daily except on Tuesdays and Thursdays. - Schedule follow-up blood test in three months. - Schedule follow-up appointment in six months.  Weight Loss Weight loss to 110 pounds due to stress-related decreased appetite. She reports resuming regular eating habits.  Uncontrolled hypertension - Her blood pressure  was very high, she contributes to her long waiting and upset - She agrees to monitor blood pressure at home and call her PCP if needed.  Plan - CBC reviewed, normal platelet count, mild anemia stable, will continue anagrelide  at current dose - Lab every 3 months and follow-up in 6 months   Discussed the use of AI scribe software for clinical note transcription with the patient, who gave verbal consent to proceed.  History of Present Illness Paula Griffith is a 78 year old female with essential thrombocythemia who presents for follow-up.  She takes anagrelide  five days a week, excluding Tuesdays and Thursdays. Her last lab results in April showed a normal platelet count and a hemoglobin level of 10.1, an improvement from previous results. She reports weight loss, now weighing 110 pounds, but is back to eating regularly. She is experiencing significant stress due to her youngest daughter's terminal illness.     All other systems were reviewed with the patient and are negative.  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   Spondylolisthesis of lumbar region    Thrombocytopathy [facility]) 2011   Dr Townsend    SURGICAL HISTORY: Past Surgical History:  Procedure Laterality Date   ABDOMINAL HYSTERECTOMY  1989   ANTERIOR CERVICAL DECOMP/DISCECTOMY FUSION N/A 09/22/2017  Procedure: ANTERIOR CERVICAL DECOMPRESSION AND FUSION CERVICAL THREE-FOUR.;  Surgeon: Joshua Alm RAMAN, MD;  Location: [facility] OR;  Service: Neurosurgery;  Laterality: N/A;  anterior   BACK SURGERY     BREAST  EXCISIONAL BIOPSY Left    BREAST SURGERY     Left   LUMBAR LAMINECTOMY/DECOMPRESSION MICRODISCECTOMY Left 01/06/2018   Procedure: laminectomy Lumbar five -Sacral one - left, Lumbar two-Lumbar three - bilateral;  Surgeon: Joshua Alm RAMAN, MD;  Location[facility] OR;  Service: Neurosurgery;  Laterality: Left;    I have reviewed the social history and family history with the patient and they are unchanged from previous note.  ALLERGIES:  is allergic to thiazide-type diuretics, benazepril , amlodipine , fluad [influenza vac a&b surf ant adj], other, codeine, and tramadol .  MEDICATIONS:  Current Outpatient Medications  Medication Sig Dispense Refill   acetaminophen  (TYLENOL ) 325 MG tablet Take 2 tablets (650 mg total) by mouth every 6 (six) hours as needed for mild pain (or Fever >/= 101). 20 tablet 0   albuterol  (PROAIR  HFA) 108 (90 Base) MCG/ACT inhaler Inhale 2 puffs into the lungs every 4 (four) hours as needed for wheezing. For shortness of breath 8 g 5   anagrelide  (AGRYLIN) 1 MG capsule Take 1 capsule (1 mg total) by mouth See admin instructions. TAKE 1 CAPSULE BY MOUTH DAILY EXCEPT 2 DAYS A WEEK (TUESDAYS AND THURSDAYS) DO NOT TAKE. 120 capsule 1   Ascorbic Acid  (VITAMIN C) 1000 MG tablet Take 1,000 mg by mouth daily.     aspirin  325 MG EC tablet Take 325 mg by mouth daily after breakfast.     Cholecalciferol  (VITAMIN D3) 2000 units TABS Take 2,000 Units by mouth daily.     fluticasone -salmeterol (WIXELA INHUB) 100-50 MCG/ACT AEPB Inhale 1 puff into the lungs 2 (two) times daily. 1 each 5   gabapentin  (NEURONTIN ) 100 MG capsule Take 1 capsule (100 mg total) by mouth 3 (three) times daily as needed. 270 capsule 1   hydrochlorothiazide  (MICROZIDE ) 12.5 MG capsule Take 1 capsule (12.5 mg total) by mouth daily. 90 capsule 3   HYDROcodone -acetaminophen  (NORCO/VICODIN) 5-325 MG tablet 1/2 - 1 tab by mouth up to four times per day as needed for moderate to severe pain 30 tablet 0   Ibuprofen  (ADVIL ) 200 MG  CAPS Take 1 capsule by mouth daily as needed (as needed). (Patient not taking: Reported on 01/03/2024)     Multiple Vitamins-Minerals (ADULT GUMMY PO) Take 2 tablets by mouth daily.     Nutritional Supplements (ENSURE HIGH PROTEIN) LIQD Take 1 Can by mouth daily.     ondansetron  (ZOFRAN ) 4 MG tablet Take 1 tablet (4 mg total) by mouth every 6 (six) hours as needed for nausea. 20 tablet 0   potassium chloride  SA (KLOR-CON  M) 20 MEQ tablet Take 1 tablet (20 mEq total) by mouth daily. 90 tablet 3   silver  sulfADIAZINE  (SILVADENE ) 1 % cream Apply topically daily. 50 g 0   trolamine salicylate (ASPERCREME) 10 % cream Apply 1 application  topically 2 (two) times daily as needed for muscle pain.     No current facility-administered medications for this visit.    PHYSICAL EXAMINATION: ECOG PERFORMANCE STATUS: 1 - Symptomatic but completely ambulatory  Vitals:   01/20/24 1525 01/20/24 1526  BP: (!) 226/95 (!) 229/95  Pulse: 75 87  Resp:    Temp:    SpO2: 100% 100%   Wt Readings from Last 3 Encounters:  01/20/24 110 lb 1.6 oz (49.9 kg)  01/03/24 108 lb (  49 kg)  12/30/23 108 lb (49 kg)     GENERAL:alert, no distress and comfortable SKIN: skin color, texture, turgor are normal, no rashes or significant lesions EYES: normal, Conjunctiva are pink and non-injected, sclera clear Musculoskeletal:no cyanosis of digits and no clubbing  NEURO: alert & oriented x 3 with fluent speech, no focal motor/sensory deficits  Physical Exam    LABORATORY DATA:  I have reviewed the data as listed    Latest Ref Rng & Units 01/20/2024    2:58 PM 10/21/2023    2:01 PM 07/22/2023    1:29 PM  CBC  WBC 4.0 - 10.5 K/uL 5.7  5.0  5.5   Hemoglobin 12.0 - 15.0 g/dL 89.7  89.8  9.5   Hematocrit 36.0 - 46.0 % 28.9  29.2  25.8   Platelets 150 - 400 K/uL 280  285  PLATELET CLUMPS NOTED ON SMEAR, COUNT APPEARS ADEQUATE         Latest Ref Rng & Units 01/20/2024    2:58 PM 10/21/2023    2:01 PM 07/22/2023    1:29 PM   CMP  Glucose 70 - 99 mg/dL 80  99  90   BUN 8 - 23 mg/dL 17  12  10    Creatinine 0.44 - 1.00 mg/dL 9.03  9.08  9.08   Sodium 135 - 145 mmol/L 130  134  122   Potassium 3.5 - 5.1 mmol/L 4.5  4.3  2.9   Chloride 98 - 111 mmol/L 97  102  80   CO2 22 - 32 mmol/L 26  25  32   Calcium 8.9 - 10.3 mg/dL 9.8  9.6  89.9   Total Protein 6.5 - 8.1 g/dL 7.3  7.1  7.4   Total Bilirubin 0.0 - 1.2 mg/dL 0.5  0.4  0.6   Alkaline Phos 38 - 126 U/L 55  48  49   AST 15 - 41 U/L 17  15  23    ALT 0 - 44 U/L 5  <5  5       RADIOGRAPHIC STUDIES: I have personally reviewed the radiological images as listed and agreed with the findings in the report. No results found.    No orders of the defined types were placed in this encounter.  All questions were answered. The patient knows to call the clinic with any problems, questions or concerns. No barriers to learning was detected. The total time spent in the appointment was 15 minutes, including review of chart and various tests results, discussions about plan of care and coordination of care plan     Paula Mattock, MD 01/20/2024

## 2024-01-20 NOTE — Assessment & Plan Note (Addendum)
 essential thrombocythemia, JAK2 mutation negative. -We previously reviewed the natural history of ET, most patients do well, major complication is thrombosis. -She was previously on Hydrea , switched to anagrelide  in 05/2015 due to anemia from Hydrea . -Her anagrelide  dose has been titrated as needed. We will monitor her EKG periodically. She is currently on Anagrelide  1mg  daily except Tuesdays/Thursdays.  -Labs overall stable. Plan to continue Anagrelide  1mg  once daily 5 days a week

## 2024-01-25 ENCOUNTER — Other Ambulatory Visit: Payer: Self-pay

## 2024-01-25 NOTE — Patient Outreach (Signed)
 Complex Care Management   Visit Note  01/25/2024  Name:  Paula Griffith MRN: [id number] DOB: 05/03/1946  Situation: Referral received for Complex Care Management related to HTN I obtained verbal consent from Patient.  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) 2011   Dr Townsend    Assessment: Medication review completed: Patient reports she needs a refill on [place] and Hydrocodone -acetaminophen  for back pain. Paula Griffith reports she has been out of Hydrocodone -acetaminophen  about a month.    Per review of chart: patient BP elevated at oncology office visit on 01/20/24, BP 226/95 HR 75, and is unable to tell RNCM when last checked. But reports she is taking her BP medication. She expressed stress/concern about her daughter being in the hospital and she states she would have the hospital nurse check her BP.  When RNCM reiterated the importance of also taking care of herself and ensuring that her Blood pressure is controlled. Patient reports she is getting upset because she needs to go to the hospital to check on her daughter. She declines to discuss any further and patient ended call abruptly. RNCM was unable to complete the call any further.   Patient Reported Symptoms:  Cognitive Cognitive Status: Alert and oriented to person, place, and time, Insightful and able to interpret abstract concepts, Normal speech and language skills      Neurological Neurological Review of  Symptoms: No symptoms reported    HEENT HEENT Symptoms Reported: No symptoms reported      Cardiovascular Cardiovascular Symptoms Reported: No symptoms reported Does patient have uncontrolled Hypertension?: Yes Patient's Recent BP reading at home: patient reports she has not checked BP today and is unable to tell RNCM when last checked, but states, she will have the nurse at the hospital to check her blood pressure. RNCM reiterated the importance of also taking care of herself and ensuring that her Blood pressure is managed. Patient reports she is getting upset because she needs to go to the hospital to check on her daughter, declines to discuss any further and patient ended call abruptly.    Respiratory Respiratory Symptoms Reported: No symptoms reported    Endocrine Endocrine Symptoms Reported:  (patient did not want to continue assessment and hung up)    Gastrointestinal Gastrointestinal Symptoms Reported: Not assessed Additional Gastrointestinal Details: patient did not want to continue assessment and hung up      Genitourinary Genitourinary Symptoms Reported: Not assessed Additional Genitourinary Details: patient did not want to continue assessment and hung up    Integumentary Integumentary Symptoms Reported: Not assessed Additional Integumentary Details: patient did not want to continue assessment and hung up    Musculoskeletal Musculoskelatal Symptoms Reviewed: Back pain Additional Musculoskeletal Details: patient reports back pain due to increased walking back and forth to hospital to see her daughter. She states 8/10 on pain scale. She states she  recently took some pain medication and is awaiting on it to work.        Psychosocial Additional Psychological Details: Patient reports stress due to daughter is in the hospital-She states daughter has been gvien 3 months to live. Patient states she has to go to the hospital to see her daughter and did not want to continue assessment  with RNCM and hung up.            01/03/2024    3:17 PM  Depression screen PHQ 2/9  Decreased Interest 0  Down, Depressed, Hopeless 0  PHQ - 2 Score 0  Altered sleeping 0  Tired, decreased energy 0  Change in appetite 0  Feeling bad or failure about yourself  0  Trouble concentrating 0  Moving slowly or fidgety/restless 1  Suicidal thoughts 0  PHQ-9 Score 1  Difficult doing work/chores Not difficult at all    There were no vitals filed for this visit.  Medications Reviewed Today     Reviewed by Paula Moor M, RN (Registered Nurse) on 01/25/24 at 1158  Med List Status: <None>   Medication Order Taking? Sig Documenting Provider Last Dose Status Informant  acetaminophen  (TYLENOL ) 325 MG tablet [id number] Yes Take 2 tablets (650 mg total) by mouth every 6 (six) hours as needed for mild pain (or Fever >/= 101). Paula Griffith [place], DO  Active Child           Med Note Paula Griffith   Thu Mar 18, 2023  2:53 PM) PRN  albuterol  (PROAIR  HFA) 108 (90 Base) MCG/ACT inhaler [id number] Yes Inhale 2 puffs into the lungs every 4 (four) hours as needed for wheezing. For shortness of breath Griffith, Paula GAILS, MD  Active   anagrelide  (AGRYLIN) 1 MG capsule [id number] Yes Take 1 capsule (1 mg total) by mouth See admin instructions. TAKE 1 CAPSULE BY MOUTH DAILY EXCEPT 2 DAYS A WEEK (TUESDAYS AND THURSDAYS) DO NOT TAKE. Paula Callander, MD  Active   Ascorbic Acid  (VITAMIN C) 1000 MG tablet [id number] Yes Take 1,000 mg by mouth daily. [provider]  Active Self  aspirin  325 MG EC tablet [id number] Yes Take 325 mg by mouth daily after breakfast. [provider]  Active Child  Cholecalciferol  (VITAMIN D3) 2000 units TABS [id number] Yes Take 2,000 Units by mouth daily. [provider]  Active Child           Med Note Paula Griffith   Wed Sep 08, 2023 11:46 AM) Reports has 1000IU/33mcg. takes one tablet daily  fluticasone -salmeterol (WIXELA INHUB) 100-50 MCG/ACT AEPB  [id number]  Inhale 1 puff into the lungs 2 (two) times daily.  Patient not taking: Reported on 01/25/2024   Griffith, Paula V, MD  Active   gabapentin  (NEURONTIN ) 100 MG capsule [id number] Yes Take 1 capsule (100 mg total) by mouth 3 (three) times daily as needed. Griffith, Paula V, MD  Active   hydrochlorothiazide  (MICROZIDE ) 12.5 MG capsule [id number] Yes Take 1 capsule (12.5 mg total) by mouth daily. Griffith, Paula V, MD  Active   HYDROcodone -acetaminophen  (NORCO/VICODIN) 5-325 MG tablet [id number]  1/2 - 1 tab by mouth up to four times per day as needed for moderate to severe pain  Patient not taking: Reported on 01/25/2024   Norleen Lynwood ORN, MD  Active   Ibuprofen  (ADVIL ) 200 MG CAPS [id number]  Take 1 capsule by mouth daily as needed (as needed).  Patient not taking: Reported on 01/03/2024   [provider]  Active  Discontinued 06/28/19 1140   Multiple Vitamins-Minerals (ADULT GUMMY PO) [id number] Yes Take 2 tablets by mouth daily. [provider]  Active Child  Nutritional Supplements (ENSURE HIGH PROTEIN) LIQD [id number] Yes Take 1 Can by mouth daily. [provider]  Active   ondansetron  (ZOFRAN ) 4 MG tablet [id number] Yes Take 1 tablet (4 mg total) by mouth every 6 (six) hours as needed for nausea. Sheikh, Omair Newton, DO  Active Child  potassium chloride  SA (KLOR-CON  Griffith) 20 MEQ tablet [id number]  Take 1 tablet (20 mEq total) by mouth daily.  Patient not taking: Reported on 01/25/2024   Griffith, Paula V, MD  Active   silver  sulfADIAZINE  (SILVADENE ) 1 % cream [id number]  Apply topically daily.  Patient not taking: Reported on 01/25/2024   Paula Alejandro Donovan, DO  Active Child           Med Note Paula, Briannah Lona Griffith   Tue Sep 14, 2023  2:54 PM) Reports uses as needed  trolamine salicylate (ASPERCREME) 10 % cream [id number] Yes Apply 1 application  topically 2 (two) times daily as needed for muscle pain. [provider]  Active Child           Recommendation:   Outreach to reschedule telephone visit. Possible referral to LCSW regarding stressors  Follow Up Plan:   RNCM will send request to Care guide to assist with rescheduling telephone visit.  Heddy Shutter, RN, MSN, BSN, CCM [place]  [facility], Population Health Case Manager Phone: [phone]

## 2024-01-25 NOTE — Patient Instructions (Signed)
 Visit Information  Thank you for taking time to visit with me today. Please don't hesitate to contact me if I can be of assistance to you.  The care guide will call you to reschedule our next telephone call or you may call me at [phone].  Please call the care guide team at [phone] if you need to cancel, schedule, or reschedule an appointment.   Please call the Suicide and Crisis Lifeline: 988 call the [place]  National Suicide Prevention Lifeline: 4358001763 or TTY: 405 501 6092 TTY [phone]) to talk to a trained counselor if you are experiencing a Mental Health or Behavioral Health Crisis or need someone to talk to.  Heddy Shutter, RN, MSN, BSN, CCM [place]  [facility], Population Health Case Manager Phone: [phone]

## 2024-03-06 ENCOUNTER — Other Ambulatory Visit

## 2024-03-14 ENCOUNTER — Other Ambulatory Visit: Payer: Self-pay

## 2024-03-14 ENCOUNTER — Other Ambulatory Visit: Payer: Self-pay | Admitting: Internal Medicine

## 2024-03-14 DIAGNOSIS — I10 Essential (primary) hypertension: Secondary | ICD-10-CM

## 2024-03-14 NOTE — Patient Outreach (Signed)
 Complex Care Management   Visit Note  03/14/2024  Name:  Paula Griffith MRN: [id number] DOB: 1945-09-17  Situation: Referral received for Complex Care Management related to HTN I obtained verbal consent from Patient.  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 Per review of medications with patient, she is in need of refills for Albuterol  inhaler, Wixela inhaler, Neurontin , hydrochlorothiazide  and Agrylin. She is not able to tell RNCM when she had these medications last. And states refills were needed on these prescriptions. However, patient states she does have the hydrochlorothiazide  bottle and states there are refills on hydrochlorothiazide . RNCM called patient's pharmacy to request refill on all these medications except the Agryln. RNCM will update oncologist regarding refill recommendations on this medication. RNCM discussed blister packaging for patient, but patient declines at this time.  [facility] and states they are preparing refills on Albuterol  inhaler, Wixela. The HCTZ is due to be filled on 03/16/24. When I asked about the Agrylin the pharmacy tech stated that they have a bottle that is already ready for pick up now. And that if Neurontin  is needed that patient  will need a new prescription. Message to providers updated: PCP/Clinical pool/Clinical pharmacist and Dr. Lanny.  Patient Reported Symptoms:  Cognitive Cognitive Status: No symptoms reported, Alert and oriented to person, place, and time      Neurological Neurological Review of Symptoms: No symptoms reported    HEENT HEENT Symptoms Reported: No symptoms reported      Cardiovascular Cardiovascular Symptoms Reported: No symptoms reported Does patient have uncontrolled Hypertension?: Yes Is patient checking Blood Pressure at home?: No Patient's Recent BP reading at home: patient reports her BP has not been checked since last week. She states it is checked by her daughter, and states it is fine, but unable to tell RNCM the reading. Patient denies any headaches, dizziness or pain. Patient states, I am fine.    Respiratory Respiratory Symptoms Reported: No symptoms reported    Endocrine Endocrine Symptoms Reported: No symptoms reported Is patient diabetic?: No    Gastrointestinal Gastrointestinal Symptoms Reported: No symptoms reported      Genitourinary Genitourinary Symptoms Reported: No symptoms reported    Integumentary Integumentary Symptoms Reported: No symptoms reported    Musculoskeletal Musculoskelatal Symptoms Reviewed: No symptoms reported Additional Musculoskeletal Details: denies any pain at this time        Psychosocial Psychosocial Symptoms Reported: No symptoms reported          There were no vitals filed for this visit.  Medications Reviewed Today     Reviewed by Octavious Zidek M, RN (Registered Nurse) on 03/14/24 at 1213  Med List Status: <None>   Medication Order  Taking? Sig Documenting Provider Last Dose Status Informant  acetaminophen  (TYLENOL ) 325 MG tablet [id number] Yes Take 2 tablets (650 mg total) by mouth every 6 (six) hours as needed for mild pain (or Fever >/= 101). Sherrill Cable [place], DO  Active Child           Med Note VIOLETTA, CRISTINA R    Thu Mar 18, 2023  2:53 PM) PRN  albuterol  (PROAIR  HFA) 108 (90 Base) MCG/ACT inhaler [id number]  Inhale 2 puffs into the lungs every 4 (four) hours as needed for wheezing. For shortness of breath  Patient not taking: Reported on 03/14/2024   [name], Aleksei V, MD  Active   anagrelide  (AGRYLIN) 1 MG capsule [id number]  Take 1 capsule (1 mg total) by mouth See admin instructions. TAKE 1 CAPSULE BY MOUTH DAILY EXCEPT 2 DAYS A WEEK (TUESDAYS AND THURSDAYS) DO NOT TAKE.  Patient not taking: Reported on 03/14/2024   Lanny Callander, MD  Active   Ascorbic Acid  (VITAMIN C) 1000 MG tablet [id number]  Take 1,000 mg by mouth daily.  Patient not taking: Reported on 03/14/2024   [provider]  Active Self  aspirin  325 MG EC tablet [id number] Yes Take 325 mg by mouth daily after breakfast. [provider]  Active Child  Cholecalciferol  (VITAMIN D3) 2000 units TABS [id number] Yes Take 2,000 Units by mouth daily. [provider]  Active Child           Med Note KARLYNN, HEDDY HERO   Wed Sep 08, 2023 11:46 AM) Reports has 1000IU/61mcg. takes one tablet daily  fluticasone -salmeterol (WIXELA INHUB) 100-50 MCG/ACT AEPB [id number]  Inhale 1 puff into the lungs 2 (two) times daily.  Patient not taking: Reported on 03/14/2024   [name], Aleksei V, MD  Active   gabapentin  (NEURONTIN ) 100 MG capsule [id number]  Take 1 capsule (100 mg total) by mouth 3 (three) times daily as needed.  Patient not taking: Reported on 03/14/2024   [name], Aleksei V, MD  Active   hydrochlorothiazide  (MICROZIDE ) 12.5 MG capsule [id number]  Take 1 capsule (12.5 mg total) by mouth daily.  Patient not taking: Reported on 03/14/2024   [name], Aleksei V, MD  Active   HYDROcodone -acetaminophen  (NORCO/VICODIN) 5-325 MG tablet [id number]  1/2 - 1 tab by mouth up to four times per day as needed for moderate to severe pain  Patient not taking: Reported on 03/14/2024   Norleen Lynwood ORN, MD  Active   Ibuprofen  (ADVIL ) 200 MG CAPS  [id number]  Take 1 capsule by mouth daily as needed (as needed).  Patient not taking: Reported on 03/14/2024   [provider]  Active     Discontinued 06/28/19 1140   Multiple Vitamins-Minerals (ADULT GUMMY PO) [id number] Yes Take 2 tablets by mouth daily. [provider]  Active Child  Nutritional Supplements (ENSURE HIGH PROTEIN) LIQD [id number] Yes Take 1 Can by mouth daily. [provider]  Active   ondansetron  (ZOFRAN ) 4 MG tablet [id number] Yes Take 1 tablet (4 mg total) by mouth every 6 (six) hours as needed for nausea. Sheikh, Omair Chamberlain, DO  Active Child  potassium chloride  SA (KLOR-CON  M) 20 MEQ tablet [id number] Yes Take 1 tablet (20 mEq total) by mouth daily. [name], Aleksei V, MD  Active   silver  sulfADIAZINE  (SILVADENE ) 1 % cream [id number]  Apply topically daily.  Patient not taking: Reported on 03/14/2024   Sherrill Cable [place], DO  Active Child           Med Note [place],  HEDDY HERO   Tue Sep 14, 2023  2:54 PM) Reports uses as needed  trolamine salicylate (ASPERCREME) 10 % cream [id number]  Apply 1 application  topically 2 (two) times daily as needed for muscle pain.  Patient not taking: Reported on 03/14/2024   [provider]  Active Child          Recommendation:   PCP Follow-up Specialty provider follow-up 04/20/24 labs/oncology Referral to: clinical pharmacy regarding medication adherence RNCM will update PCP and oncologist of above  Follow Up Plan:   Telephone follow up appointment date/time:  03/23/24 at 11:30 am  HEDDY Shutter, RN, MSN, BSN, CCM [place]  [facility], Population Health Case Manager Phone: [phone]

## 2024-03-14 NOTE — Patient Instructions (Signed)
 Visit Information  Thank you for taking time to visit with me today. Please don't hesitate to contact me if I can be of assistance to you before our next scheduled appointment.  Your next care management appointment is by telephone on 03/23/24 at 11:30 am    Please call the care guide team at [phone] if you need to cancel, schedule, or reschedule an appointment.   Please call the Suicide and Crisis Lifeline: 988 call the [place]  National Suicide Prevention Lifeline: (412)796-4811 or TTY: 412-572-0067 TTY 410-275-1463) to talk to a trained counselor call 1-800-273-TALK (toll free, 24 hour hotline) if you are experiencing a Mental Health or Behavioral Health Crisis or need someone to talk to.  Heddy Shutter, RN, MSN, BSN, CCM [place]  [facility], Population Health Case Manager Phone: [phone]

## 2024-03-20 ENCOUNTER — Telehealth: Payer: Self-pay | Admitting: *Deleted

## 2024-03-20 NOTE — Progress Notes (Signed)
 Care Guide Pharmacy Note  03/20/2024 Name: Paula Griffith MRN: [id number] DOB: [birth year]  Referred By: Garald [name], MD Reason for referral: Call Attempt #1 and Complex Care Management (Outreach to schedule referral with pharmacist )   Paula Griffith is a 78 y.o. year old female who is a primary care patient of [name], [name], MD.  [name] was referred to the pharmacist for assistance related to: HTN  Successful contact was made with the patient to discuss pharmacy services including being ready for the pharmacist to call at least 5 minutes before the scheduled appointment time and to have medication bottles and any blood pressure readings ready for review. The patient agreed to meet with the pharmacist via telephone visit on 03/30/2024  Thedford Franks, CMA [place]  [facility], [facility] Guide Direct Dial: [phone]  Fax: [phone] Website: [email or web address]

## 2024-03-23 ENCOUNTER — Other Ambulatory Visit: Payer: Self-pay

## 2024-03-23 NOTE — Patient Outreach (Signed)
 Complex Care Management   Visit Note  03/23/2024  Name:  Paula Griffith MRN: [id number] DOB: 1945/07/17  Situation: Referral received for Complex Care Management related to HTN I obtained verbal consent from Patient.  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) 2011   Dr Townsend    Assessment: Patient Reported Symptoms:  Cognitive Cognitive Status: No symptoms reported      Neurological Neurological Review of Symptoms: No symptoms reported    HEENT HEENT Symptoms Reported: No symptoms reported      Cardiovascular Cardiovascular Symptoms Reported: No symptoms reported Does patient have uncontrolled Hypertension?: Yes Is patient checking Blood Pressure at home?: Yes Patient's Recent BP reading at home: patient unable to tell RNCM latest blood pressure. she reports her daughter has checked her BP recently, but did not record the reading.    Respiratory Respiratory Symptoms Reported: No symptoms reported    Endocrine Endocrine Symptoms Reported: No symptoms reported    Gastrointestinal Gastrointestinal Symptoms Reported: No symptoms reported      Genitourinary Genitourinary Symptoms Reported: No symptoms reported    Integumentary Integumentary Symptoms Reported: No symptoms reported    Musculoskeletal Musculoskelatal Symptoms Reviewed: No symptoms reported        Psychosocial Psychosocial Symptoms Reported: No symptoms reported Additional Psychological  Details: patient reports, I am doing good She states she is sitting on the porch doing something she loves to do everyday.          There were no vitals filed for this visit.  Medications Reviewed Today     Reviewed by Lekeith Wulf M, RN (Registered Nurse) on 03/23/24 at 2230  Med List Status: <None>   Medication Order Taking? Sig Documenting Provider Last Dose Status Informant  acetaminophen  (TYLENOL ) 325 MG tablet [id number] Yes Take 2 tablets (650 mg total) by mouth every 6 (six) hours as needed for mild pain (or Fever >/= 101). Sherrill Cable [place], DO  Active Child           Med Note VIOLETTA, CRISTINA R   Thu Mar 18, 2023  2:53 PM) PRN  albuterol  (PROAIR  HFA) 108 (90 Base) MCG/ACT inhaler [id number] Yes Inhale 2 puffs into the lungs every 4 (four) hours as needed for wheezing. For shortness of breath [name], [name], MD  Active   anagrelide  (AGRYLIN) 1 MG capsule [id number] Yes Take 1 capsule (1 mg total) by mouth See admin instructions. TAKE 1 CAPSULE BY MOUTH DAILY EXCEPT 2 DAYS A WEEK (TUESDAYS AND THURSDAYS) DO NOT TAKE. Lanny Callander, MD  Active   Ascorbic Acid  (VITAMIN C) 1000 MG tablet [id number] Yes Take 1,000 mg by mouth daily. [provider]  Active Self  aspirin  325 MG EC tablet [id number] Yes Take 325 mg by mouth daily after breakfast. [provider]  Active Child  Cholecalciferol  (VITAMIN D3) 2000 units TABS [id number] Yes Take 2,000  Units by mouth daily. [provider]  Active Child           Med Note KARLYNN, HEDDY HERO   Wed Sep 08, 2023 11:46 AM) Reports has 1000IU/61mcg. takes one tablet daily  fluticasone -salmeterol (WIXELA INHUB) 100-50 MCG/ACT AEPB [id number] Yes Inhale 1 puff into the lungs 2 (two) times daily. [name], Aleksei V, MD  Active   gabapentin  (NEURONTIN ) 100 MG capsule [id number] Yes TAKE 1 CAPSULE BY MOUTH THREE TIMES A DAY AS NEEDED [name], Aleksei V, MD  Active   hydrochlorothiazide  (MICROZIDE ) 12.5 MG capsule [id number]  Yes Take 1 capsule (12.5 mg total) by mouth daily. [name], Aleksei V, MD  Active   HYDROcodone -acetaminophen  (NORCO/VICODIN) 5-325 MG tablet [id number]  1/2 - 1 tab by mouth up to four times per day as needed for moderate to severe pain  Patient not taking: Reported on 03/23/2024   Norleen Lynwood ORN, MD  Active   Ibuprofen  (ADVIL ) 200 MG CAPS [id number]  Take 1 capsule by mouth daily as needed (as needed).  Patient not taking: Reported on 03/23/2024   [provider]  Active     Discontinued 06/28/19 1140   Multiple Vitamins-Minerals (ADULT GUMMY PO) [id number] Yes Take 2 tablets by mouth daily. [provider]  Active Child  Nutritional Supplements (ENSURE HIGH PROTEIN) LIQD [id number]  Take 1 Can by mouth daily. [provider]  Active   ondansetron  (ZOFRAN ) 4 MG tablet [id number]  Take 1 tablet (4 mg total) by mouth every 6 (six) hours as needed for nausea.  Patient not taking: Reported on 03/23/2024   Sherrill Alejandro Donovan, DO  Active Child  potassium chloride  SA (KLOR-CON  M) 20 MEQ tablet [id number] Yes Take 1 tablet (20 mEq total) by mouth daily. [name], Aleksei V, MD  Active   silver  sulfADIAZINE  (SILVADENE ) 1 % cream [id number]  Apply topically daily.  Patient not taking: Reported on 03/23/2024   Sherrill Alejandro Donovan, DO  Active Child           Med Note KARLYNN, Faith Patricelli M   Tue Sep 14, 2023  2:54 PM) Reports uses as needed  trolamine salicylate (ASPERCREME) 10 % cream [id number]  Apply 1 application  topically 2 (two) times daily as needed for muscle pain.  Patient not taking: Reported on 03/23/2024   [provider]  Active Child          Recommendation:   Continue Current Plan of Care Clinical pharmacist scheduled to call patient 03/30/24  Follow Up Plan:   Telephone follow up appointment date/time:  04/19/24 at 1:30 pm  HEDDY Shutter, RN, MSN, BSN, CCM [place]  [facility], Population Health Case Manager Phone: [phone]

## 2024-03-23 NOTE — Patient Instructions (Signed)
 Visit Information  Thank you for taking time to visit with me today. Please don't hesitate to contact me if I can be of assistance to you before our next scheduled appointment.  Your next care management appointment is by telephone on 04/19/24 at 1:30 pm   Please call the care guide team at [phone] if you need to cancel, schedule, or reschedule an appointment.   Please call the Suicide and Crisis Lifeline: 988 call the [place]  National Suicide Prevention Lifeline: 347-819-6528 or TTY: 606-159-3115 TTY 469 565 8676) to talk to a trained counselor call 1-800-273-TALK (toll free, 24 hour hotline) if you are experiencing a Mental Health or Behavioral Health Crisis or need someone to talk to.  Heddy Shutter, RN, MSN, BSN, CCM [place]  [facility], Population Health Case Manager Phone: [phone]

## 2024-03-28 ENCOUNTER — Other Ambulatory Visit: Admitting: Pharmacist

## 2024-03-28 DIAGNOSIS — I10 Essential (primary) hypertension: Secondary | ICD-10-CM

## 2024-03-28 MED ORDER — LOSARTAN POTASSIUM 50 MG PO TABS: 50.0000 mg | ORAL_TABLET | Freq: Every day | ORAL | 0 refills | Status: DC

## 2024-03-28 NOTE — Patient Instructions (Signed)
 It was a pleasure speaking with you today!  Start losartan  50 mg daily. Stop potassium.  Come for your appointment on 11/3 for BP check and labs with Dr. Garald.  Feel free to call with any questions or concerns!  Darrelyn Drum, PharmD, BCPS, CPP Clinical Pharmacist Practitioner [place] [facility] at [facility] [phone]

## 2024-03-28 NOTE — Progress Notes (Cosign Needed Addendum)
 03/28/2024 Name: Paula Griffith MRN: [id number] DOB: 05-09-1946  Chief Complaint  Patient presents with   Hypertension   Medication Management    Paula Griffith is a 78 y.o. year old female who presented for a telephone visit.   They were referred to the pharmacist by their Case Management Team  for assistance in managing hypertension and medication adherence.    Subjective:  Care Team: Primary Care Provider: Garald [name], MD ; Next Scheduled Visit: not scheduled  Medication Access/Adherence  [phone] [place] [place] - [address] [place] [address] Phone: [phone] Fax: [phone]    Patient reports affordability concerns with their medications: No  Patient reports access/transportation concerns to their pharmacy: No  Patient reports adherence concerns with their medications:  Yes    Pt states she takes all of her medications that are prescribed to her.  Have previously spoken with pt twice regarding medication adherence/uncontrolled blood pressure.    Hypertension:  Current medications: hydrochlorothiazide  12.5 mg daily  Currently taking potassium chloride  ER 20 mEQ daily  Hydrochlorothiazide  and potassium are current up to date on refills  Pt notes she is currently going through a very difficult time which is likely affecting her blood pressure. Her daughter has received a terminal diagnosis.  Patient has a validated, automated, upper arm home BP cuff.  Current blood pressure readings: none   Objective: BP Readings from Last 3 Encounters:  01/20/24 (!) 229/95  12/30/23 (!) 166/90  09/09/23 (!) 158/92     Lab Results  Component Value Date   HGBA1C 5.4 04/28/2021    Lab Results  Component Value Date   CREATININE 0.96 01/20/2024   BUN 17 01/20/2024   NA 130 (L) 01/20/2024   K 4.5 01/20/2024   CL 97 (L) 01/20/2024   CO2 26 01/20/2024    Lab Results  Component  Value Date   CHOL 158 03/01/2018   HDL 47.10 03/01/2018   LDLCALC 94 03/01/2018   TRIG 84.0 03/01/2018   CHOLHDL 3 03/01/2018    Medications Reviewed Today     Reviewed by Merceda Lela SAUNDERS, RPH (Pharmacist) on 03/28/24 at 1329  Med List Status: <None>   Medication Order Taking? Sig Documenting Provider Last Dose Status Informant  acetaminophen  (TYLENOL ) 325 MG tablet [id number]  Take 2 tablets (650 mg total) by mouth every 6 (six) hours as needed for mild pain (or Fever >/= 101). Sherrill Cable [place], DO  Active Child           Med Note VIOLETTA, Locke Barrell R   Thu Mar 18, 2023  2:53 PM) PRN  albuterol  (PROAIR  HFA) 108 (90 Base) MCG/ACT inhaler [id number]  Inhale 2 puffs into the lungs every 4 (four) hours as needed for wheezing. For shortness of breath [name], [name], MD  Active   anagrelide  (AGRYLIN) 1 MG capsule [id number]  Take 1 capsule (1 mg total) by mouth See admin instructions. TAKE 1 CAPSULE BY MOUTH DAILY EXCEPT 2 DAYS A WEEK (TUESDAYS AND THURSDAYS) DO NOT TAKE. Lanny Callander, MD  Active   Ascorbic Acid  (VITAMIN C) 1000 MG tablet [id number]  Take 1,000 mg by mouth daily. [provider]  Active Self  aspirin  325 MG EC tablet [id number]  Take 325 mg by mouth daily after breakfast. [provider]  Active Child  Cholecalciferol  (VITAMIN D3) 2000 units TABS [id number]  Take 2,000 Units by mouth daily. [provider]  Active Child  Med Note KARLYNN, HEDDY HERO   Wed Sep 08, 2023 11:46 AM) Reports has 1000IU/33mcg. takes one tablet daily  fluticasone -salmeterol (WIXELA INHUB) 100-50 MCG/ACT AEPB [id number]  Inhale 1 puff into the lungs 2 (two) times daily. [name], Aleksei V, MD  Active   gabapentin  (NEURONTIN ) 100 MG capsule [id number]  TAKE 1 CAPSULE BY MOUTH THREE TIMES A DAY AS NEEDED [name], Aleksei V, MD  Active   hydrochlorothiazide  (MICROZIDE ) 12.5 MG capsule [id number] Yes Take 1 capsule (12.5 mg total) by mouth daily. [name], Aleksei V, MD   Active   HYDROcodone -acetaminophen  (NORCO/VICODIN) 5-325 MG tablet [id number]  1/2 - 1 tab by mouth up to four times per day as needed for moderate to severe pain  Patient not taking: Reported on 03/23/2024   Norleen Lynwood ORN, MD  Active   Ibuprofen  (ADVIL ) 200 MG CAPS [id number]  Take 1 capsule by mouth daily as needed (as needed).  Patient not taking: Reported on 03/23/2024   [provider]  Active   losartan  (COZAAR ) 50 MG tablet [id number]  Take 1 tablet (50 mg total) by mouth daily. [name], [name], MD  Active     Discontinued 06/28/19 1140   Multiple Vitamins-Minerals (ADULT GUMMY PO) [id number]  Take 2 tablets by mouth daily. [provider]  Active Child  Nutritional Supplements (ENSURE HIGH PROTEIN) LIQD [id number]  Take 1 Can by mouth daily. [provider]  Active   ondansetron  (ZOFRAN ) 4 MG tablet [id number]  Take 1 tablet (4 mg total) by mouth every 6 (six) hours as needed for nausea.  Patient not taking: Reported on 03/23/2024   Sherrill Alejandro Donovan, DO  Active Child  potassium chloride  SA (KLOR-CON  M) 20 MEQ tablet [id number] Yes Take 1 tablet (20 mEq total) by mouth daily. [name], Aleksei V, MD  Active   silver  sulfADIAZINE  (SILVADENE ) 1 % cream [id number]  Apply topically daily.  Patient not taking: Reported on 03/23/2024   Sherrill Alejandro Donovan, DO  Active Child           Med Note KARLYNN, JUANA M   Tue Sep 14, 2023  2:54 PM) Reports uses as needed  trolamine salicylate (ASPERCREME) 10 % cream [id number]  Apply 1 application  topically 2 (two) times daily as needed for muscle pain.  Patient not taking: Reported on 03/23/2024   [provider]  Active Child             Assessment/Plan:    Hypertension: - Currently uncontrolled, goal <130/80 - hx of very uncontrolled BP with significantly elevated BP at oncology appt in July.  - Recommend to continue hydrochlorothiazide  and add losartan  50 mg daily. Stop potassium. Last K was 4.5, GFR  WNL. - Has PCP follow up in 1 month, will need BMP to check K and Scr   Follow Up Plan:  F/u after PCP f/u   Darrelyn Drum, PharmD, BCPS, CPP Clinical Pharmacist Practitioner [place] [facility] at [facility] [phone]  Medical screening examination/treatment/procedure(s) were performed by non-physician practitioner and as supervising physician I was immediately available for consultation/collaboration.  I agree with above. Karlynn Noel, MD

## 2024-04-09 ENCOUNTER — Other Ambulatory Visit: Payer: Self-pay | Admitting: Nurse Practitioner

## 2024-04-10 ENCOUNTER — Other Ambulatory Visit: Payer: Self-pay

## 2024-04-19 ENCOUNTER — Telehealth: Payer: Self-pay

## 2024-04-19 ENCOUNTER — Other Ambulatory Visit: Payer: Self-pay

## 2024-04-19 DIAGNOSIS — D649 Anemia, unspecified: Secondary | ICD-10-CM

## 2024-04-19 DIAGNOSIS — D471 Chronic myeloproliferative disease: Secondary | ICD-10-CM

## 2024-04-19 NOTE — Patient Instructions (Signed)
 Paula Griffith - I am sorry I was unable to reach you today for our scheduled appointment. I work with [name], [name], MD and am calling to support your healthcare needs. Please contact me at [phone] at your earliest convenience. I look forward to speaking with you soon.   Thank you,   Heddy Shutter, RN, MSN, BSN, CCM [place]  [facility], Population Health Case Manager Phone: [phone]

## 2024-04-20 ENCOUNTER — Inpatient Hospital Stay: Attending: Internal Medicine

## 2024-05-01 ENCOUNTER — Ambulatory Visit: Admitting: Internal Medicine

## 2024-05-03 ENCOUNTER — Ambulatory Visit: Admitting: Internal Medicine

## 2024-05-04 ENCOUNTER — Other Ambulatory Visit: Payer: Self-pay

## 2024-05-04 NOTE — Patient Outreach (Signed)
 Complex Care Management   Visit Note  05/04/2024  Name:  Paula Griffith MRN: [id number] DOB: 08/06/1945  Situation: Referral received for Complex Care Management related to HTN I obtained verbal consent from Patient.  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 She states she is out sitting on the porch. She denies any concerns at this time. RNCM discussed importance of managing BP. Reiterated importance of checking/recording BP and Stressed importance of attending Provider appointment. Patient encouraged to check/record BP and take readings to upcoming appointment next week. Patient Reported Symptoms:  Cognitive Cognitive Status: No symptoms reported      Neurological Neurological Review of Symptoms: No symptoms reported (denies any dizziness or headache. patient states, even my sight is good.)    HEENT HEENT Symptoms Reported: No symptoms reported      Cardiovascular Cardiovascular Symptoms Reported: No symptoms reported Does patient have uncontrolled Hypertension?: Yes Is patient checking Blood Pressure at home?: No Patient's Recent BP reading at home: [facility] discussed importance of checking blood pressure and managing blood pressure to minimize risk of other conditions. reiterated appointment with PCP on 05/10/24. encouraged patient to check or ask family to check BP and take readings to upcoming  appointment.    Respiratory Respiratory Symptoms Reported: No symptoms reported    Endocrine Endocrine Symptoms Reported: No symptoms reported Is patient diabetic?: No    Gastrointestinal Gastrointestinal Symptoms Reported: No symptoms reported Additional Gastrointestinal Details: patient denies any issues with GI at this time.      Genitourinary Genitourinary Symptoms Reported: No symptoms reported    Integumentary Integumentary Symptoms Reported: No symptoms reported    Musculoskeletal Musculoskelatal Symptoms Reviewed: No symptoms reported Additional Musculoskeletal Details: ambulate with cane, but states,  really dont need it.        Psychosocial Psychosocial Symptoms Reported: No symptoms reported         There were no vitals filed for this visit.  Medications Reviewed Today     Reviewed by Alaiza Yau M, RN (Registered Nurse) on 05/04/24 at 1453  Med List Status: <None>   Medication Order Taking? Sig Documenting Provider Last Dose Status Informant  acetaminophen  (TYLENOL ) 325 MG tablet [id number] Yes Take 2 tablets (650 mg total) by mouth every 6 (six) hours as needed for mild pain (or Fever >/= 101). Sherrill Cable [place], DO  Active Child           Med Note VIOLETTA, CRISTINA R   Thu Mar 18, 2023  2:53 PM) PRN  albuterol  (PROAIR  HFA) 108 (90 Base) MCG/ACT inhaler [id number] Yes Inhale 2 puffs into the lungs every 4 (four) hours as needed for wheezing. For shortness of breath [name], [name], MD  Active   anagrelide  (AGRYLIN) 1 MG capsule [id number] Yes Take 1 capsule (1 mg  total) by mouth See admin instructions. TAKE 1 CAPSULE BY MOUTH DAILY EXCEPT 2 DAYS A WEEK (TUESDAYS AND THURSDAYS) DO NOT TAKE. Lanny Callander, MD  Active   Ascorbic Acid  (VITAMIN C) 1000 MG tablet [id number] Yes Take 1,000 mg by mouth daily.  Patient taking differently: Take 1,000 mg by mouth daily.   [provider]  Active Self  aspirin  325 MG EC tablet [id number] Yes Take 325 mg by mouth  daily after breakfast. [provider]  Active Child  Cholecalciferol  (VITAMIN D3) 2000 units TABS [id number] Yes Take 2,000 Units by mouth daily.  Patient taking differently: Take 2,000 Units by mouth daily.   [provider]  Active Child           Med Note KARLYNN, HEDDY HERO   Wed Sep 08, 2023 11:46 AM) Reports has 1000IU/53mcg. takes one tablet daily  fluticasone -salmeterol (WIXELA INHUB) 100-50 MCG/ACT AEPB [id number] Yes Inhale 1 puff into the lungs 2 (two) times daily. [name], Aleksei V, MD  Active   gabapentin  (NEURONTIN ) 100 MG capsule [id number] Yes TAKE 1 CAPSULE BY MOUTH THREE TIMES A DAY AS NEEDED [name], Aleksei V, MD  Active   hydrochlorothiazide  (MICROZIDE ) 12.5 MG capsule [id number] Yes Take 1 capsule (12.5 mg total) by mouth daily. [name], Aleksei V, MD  Active   HYDROcodone -acetaminophen  (NORCO/VICODIN) 5-325 MG tablet [id number]  1/2 - 1 tab by mouth up to four times per day as needed for moderate to severe pain  Patient not taking: Reported on 05/04/2024   Norleen Lynwood ORN, MD  Active   Ibuprofen  (ADVIL ) 200 MG CAPS [id number]  Take 1 capsule by mouth daily as needed (as needed).  Patient not taking: Reported on 05/04/2024   [provider]  Active   losartan  (COZAAR ) 50 MG tablet [id number] Yes Take 1 tablet (50 mg total) by mouth daily. [name], [name], MD  Active     Discontinued 06/28/19 1140   Multiple Vitamins-Minerals (ADULT GUMMY PO) [id number] Yes Take 2 tablets by mouth daily. [provider]  Active Child  Nutritional Supplements (ENSURE HIGH PROTEIN) LIQD [id number] Yes Take 1 Can by mouth daily. [provider]  Active   ondansetron  (ZOFRAN ) 4 MG tablet [id number]  Take 1 tablet (4 mg total) by mouth every 6 (six) hours as needed for nausea.  Patient not taking: Reported on 05/04/2024   Sherrill Alejandro [place], DO  Active Child  potassium chloride  SA (KLOR-CON  M) 20 MEQ tablet [id number] Yes Take 1 tablet (20 mEq total) by  mouth daily. [name], Aleksei V, MD  Active   silver  sulfADIAZINE  (SILVADENE ) 1 % cream [id number]  Apply topically daily.  Patient not taking: Reported on 05/04/2024   Sherrill Alejandro [place], DO  Active Child           Med Note KARLYNN, Dereck Agerton M   Tue Sep 14, 2023  2:54 PM) Reports uses as needed  trolamine salicylate (ASPERCREME) 10 % cream [id number]  Apply 1 application  topically 2 (two) times daily as needed for muscle pain.  Patient not taking: Reported on 05/04/2024   [provider]  Active Child          Recommendation:   PCP Follow-up Continue Current Plan of Care  Follow Up Plan:   Telephone follow up appointment date/time:  06/01/24 at 11:30 am   Heddy Shutter, RN, MSN, BSN, CCM [place]  [facility], Population Health Case Manager Phone: [phone]

## 2024-05-04 NOTE — Patient Instructions (Addendum)
 Visit Information  Thank you for taking time to visit with me today. Please don't hesitate to contact me if I can be of assistance to you before our next scheduled appointment.  Your next care management appointment is by telephone on 06/01/2024 at 11:30 am.   Please call the care guide team at [phone] if you need to cancel, schedule, or reschedule an appointment.   Please call the Suicide and Crisis Lifeline: 988 call the [place]  National Suicide Prevention Lifeline: 405-111-1589 or TTY: 662 023 8201 TTY 724-205-6992) to talk to a trained counselor if you are experiencing a Mental Health or Behavioral Health Crisis or need someone to talk to.  Heddy Shutter, RN, MSN, BSN, CCM [place]  [facility], Population Health Case Manager Phone: [phone]    Hypertension Hypertension is another name for high blood pressure. High blood pressure forces your heart to work harder to pump blood. This can cause problems over time. There are two numbers in a blood pressure reading. There is a top number (systolic) over a bottom number (diastolic). It is best to have a blood pressure that is below 120/80. What are the causes? The cause of this condition is not known. Some other conditions can lead to high blood pressure. What increases the risk? Some lifestyle factors can make you more likely to develop high blood pressure: Smoking. Not getting enough exercise or physical activity. Being overweight. Having too much fat, sugar, calories, or salt (sodium) in your diet. Drinking too much alcohol. Other risk factors include: Having any of these conditions: Heart disease. Diabetes. High cholesterol. Kidney disease. Obstructive sleep apnea. Having a family history of high blood pressure and high cholesterol. Age. The risk increases with age. Stress. What are the signs or symptoms? High blood pressure may not cause symptoms. Very high blood pressure (hypertensive crisis) may  cause: Headache. Fast or uneven heartbeats (palpitations). Shortness of breath. Nosebleed. Vomiting or feeling like you may vomit (nauseous). Changes in how you see. Very bad chest pain. Feeling dizzy. Seizures. How is this treated? This condition is treated by making healthy lifestyle changes, such as: Eating healthy foods. Exercising more. Drinking less alcohol. Your doctor may prescribe medicine if lifestyle changes do not help enough and if: Your top number is above 130. Your bottom number is above 80. Your personal target blood pressure may vary. Follow these instructions at home: Eating and drinking  If told, follow the DASH eating plan. To follow this plan: Fill one half of your plate at each meal with fruits and vegetables. Fill one fourth of your plate at each meal with whole grains. Whole grains include whole-wheat pasta, brown rice, and whole-grain bread. Eat or drink low-fat dairy products, such as skim milk or low-fat yogurt. Fill one fourth of your plate at each meal with low-fat (lean) proteins. Low-fat proteins include fish, chicken without skin, eggs, beans, and tofu. Avoid fatty meat, cured and processed meat, or chicken with skin. Avoid pre-made or processed food. Limit the amount of salt in your diet to less than 1,500 mg each day. Do not drink alcohol if: Your doctor tells you not to drink. You are pregnant, may be pregnant, or are planning to become pregnant. If you drink alcohol: Limit how much you have to: 0-1 drink a day for women. 0-2 drinks a day for men. Know how much alcohol is in your drink. In the [place] one drink equals one 12 oz bottle of beer (355 mL), one 5 oz glass of wine (148  mL), or one 1 oz glass of hard liquor (44 mL). Lifestyle  Work with your doctor to stay at a healthy weight or to lose weight. Ask your doctor what the best weight is for you. Get at least 30 minutes of exercise that causes your heart to beat faster (aerobic  exercise) most days of the week. This may include walking, swimming, or biking. Get at least 30 minutes of exercise that strengthens your muscles (resistance exercise) at least 3 days a week. This may include lifting weights or doing Pilates. Do not smoke or use any products that contain nicotine or tobacco. If you need help quitting, ask your doctor. Check your blood pressure at home as told by your doctor. Keep all follow-up visits. Medicines Take over-the-counter and prescription medicines only as told by your doctor. Follow directions carefully. Do not skip doses of blood pressure medicine. The medicine does not work as well if you skip doses. Skipping doses also puts you at risk for problems. Ask your doctor about side effects or reactions to medicines that you should watch for. Contact a doctor if: You think you are having a reaction to the medicine you are taking. You have headaches that keep coming back. You feel dizzy. You have swelling in your ankles. You have trouble with your vision. Get help right away if: You get a very bad headache. You start to feel mixed up (confused). You feel weak or numb. You feel faint. You have very bad pain in your: Chest. Belly (abdomen). You vomit more than once. You have trouble breathing. These symptoms may be an emergency. Get help right away. Call 911. Do not wait to see if the symptoms will go away. Do not drive yourself to the hospital. Summary Hypertension is another name for high blood pressure. High blood pressure forces your heart to work harder to pump blood. For most people, a normal blood pressure is less than 120/80. Making healthy choices can help lower blood pressure. If your blood pressure does not get lower with healthy choices, you may need to take medicine. This information is not intended to replace advice given to you by your health care provider. Make sure you discuss any questions you have with your health care  provider. Document Revised: 04/03/2021 Document Reviewed: 04/03/2021 Elsevier Patient Education  2024 [employer].

## 2024-05-10 ENCOUNTER — Ambulatory Visit: Admitting: Internal Medicine

## 2024-05-24 ENCOUNTER — Ambulatory Visit: Admitting: Internal Medicine

## 2024-05-30 ENCOUNTER — Telehealth: Payer: Self-pay

## 2024-05-30 NOTE — Telephone Encounter (Signed)
 Pt was contacted as a apptmnt reminder for her up coming apptmnt.

## 2024-05-30 NOTE — Telephone Encounter (Signed)
 Copied from CRM #[id number]. Topic: General - Call Back - No Documentation >> May 30, 2024  9:11 AM Paula Griffith wrote: Pt is returning a call

## 2024-06-01 ENCOUNTER — Other Ambulatory Visit: Payer: Self-pay

## 2024-06-01 NOTE — Patient Instructions (Signed)
 Visit Information  Thank you for taking time to visit with me today. Please don't hesitate to contact me if I can be of assistance to you before our next scheduled appointment.  Your next care management appointment is by telephone on 07/06/2024 at 1 pm  Please call the care guide team at [phone] if you need to cancel, schedule, or reschedule an appointment.   Please call the Suicide and Crisis Lifeline: 988 call the [place]  National Suicide Prevention Lifeline: (937) 405-4295 or TTY: (707) 107-6441 TTY 743-673-9242) to talk to a trained counselor call 1-800-273-TALK (toll free, 24 hour hotline) if you are experiencing a Mental Health or Behavioral Health Crisis or need someone to talk to.  Heddy Shutter, RN, MSN, BSN, CCM [place]  [facility], Population Health Case Manager Phone: [phone]

## 2024-06-01 NOTE — Patient Outreach (Signed)
 Complex Care Management   Visit Note  06/01/2024  Name:  Paula Griffith MRN: [id number] DOB: [birth year]  Situation: Referral received for Complex Care Management related to Hypertension. I obtained verbal consent from Patient.  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 Reinforced that she has an appointment with her PCP on 06/06/24. Patient states her daughter has been checking her BP and it has been around 110 (systolic) per patient- last checked yesterday. Patient Reported Symptoms:  Cognitive Cognitive Status: No symptoms reported      Neurological Neurological Review of Symptoms: No symptoms reported    HEENT HEENT Symptoms Reported: No symptoms reported      Cardiovascular Cardiovascular Symptoms Reported: No symptoms reported Does patient have uncontrolled Hypertension?: Yes Is patient checking Blood Pressure at home?: Yes Patient's Recent BP reading at home: Patient reports that daughter is checking her blood pressure 2-3 times a week. Patient reports last BP was checked yesterday but does not remember the reading. She does state it was around 110 (systolic)    Respiratory Respiratory Symptoms Reported: No symptoms reported    Endocrine Endocrine Symptoms Reported: No symptoms reported Is patient diabetic?: No    Gastrointestinal Gastrointestinal Symptoms Reported: No symptoms  reported      Genitourinary Genitourinary Symptoms Reported: No symptoms reported    Integumentary Integumentary Symptoms Reported: No symptoms reported    Musculoskeletal Musculoskelatal Symptoms Reviewed: No symptoms reported Additional Musculoskeletal Details: Has a rollator and cane that she uses as needed        Psychosocial Psychosocial Symptoms Reported: No symptoms reported          There were no vitals filed for this visit. Pain Scale: 0-10 Pain Score: 0-No pain  Medications Reviewed Today     Reviewed by Destine Ambroise M, RN (Registered Nurse) on 06/01/24 at 1142  Med List Status: <None>   Medication Order Taking? Sig Documenting Provider Last Dose Status Informant  acetaminophen  (TYLENOL ) 325 MG tablet [id number] Yes Take 2 tablets (650 mg total) by mouth every 6 (six) hours as needed for mild pain (or Fever >/= 101). Sherrill Cable [place], DO  Active Child           Med Note VIOLETTA LELA JONELLE [place] Mar 18, 2023  2:53 PM) PRN  albuterol  (PROAIR  HFA) 108 [phone] Base) MCG/ACT inhaler [id number] Yes Inhale 2 puffs into the lungs every 4 (four) hours as needed for wheezing. For shortness of breath [name], [name], MD  Active   anagrelide  (AGRYLIN) 1 MG capsule [id number] Yes Take 1 capsule (1 mg total) by mouth See admin instructions. TAKE 1 CAPSULE BY MOUTH DAILY EXCEPT 2 DAYS A WEEK (TUESDAYS AND THURSDAYS) DO NOT TAKE. Lanny Callander, MD  Active   Ascorbic Acid  (VITAMIN C) 1000  MG tablet [id number] Yes Take 1,000 mg by mouth daily. [provider]  Active Self  aspirin  325 MG EC tablet [id number] Yes Take 325 mg by mouth daily after breakfast. [provider]  Active Child  Cholecalciferol  (VITAMIN D3) 2000 units TABS [id number] Yes Take 2,000 Units by mouth daily. [provider]  Active Child           Med Note KARLYNN, HEDDY HERO   Wed Sep 08, 2023 11:46 AM) Reports has 1000IU/45mcg. takes one tablet daily  fluticasone -salmeterol (WIXELA INHUB) 100-50  MCG/ACT AEPB [id number]  Inhale 1 puff into the lungs 2 (two) times daily.  Patient not taking: Reported on 06/01/2024   [name], Aleksei V, MD  Active   gabapentin  (NEURONTIN ) 100 MG capsule [id number] Yes TAKE 1 CAPSULE BY MOUTH THREE TIMES A DAY AS NEEDED [name], Aleksei V, MD  Active   hydrochlorothiazide  (MICROZIDE ) 12.5 MG capsule [id number] Yes Take 1 capsule (12.5 mg total) by mouth daily. [name], Aleksei V, MD  Active   HYDROcodone -acetaminophen  (NORCO/VICODIN) 5-325 MG tablet [id number] Yes 1/2 - 1 tab by mouth up to four times per day as needed for moderate to severe pain Norleen Lynwood ORN, MD  Active   Ibuprofen  (ADVIL ) 200 MG CAPS [id number]  Take 1 capsule by mouth daily as needed (as needed).  Patient not taking: Reported on 06/01/2024   [provider]  Active   losartan  (COZAAR ) 50 MG tablet [id number] Yes Take 1 tablet (50 mg total) by mouth daily. [name], [name], MD  Active     Discontinued 06/28/19 1140   Multiple Vitamins-Minerals (ADULT GUMMY PO) [id number] Yes Take 2 tablets by mouth daily. [provider]  Active Child  Nutritional Supplements (ENSURE HIGH PROTEIN) LIQD [id number] Yes Take 1 Can by mouth daily. [provider]  Active   ondansetron  (ZOFRAN ) 4 MG tablet [id number]  Take 1 tablet (4 mg total) by mouth every 6 (six) hours as needed for nausea.  Patient not taking: Reported on 06/01/2024   Sherrill Alejandro [place], DO  Active Child  potassium chloride  SA (KLOR-CON  M) 20 MEQ tablet [id number] Yes Take 1 tablet (20 mEq total) by mouth daily. [name], Aleksei V, MD  Active   silver  sulfADIAZINE  (SILVADENE ) 1 % cream [id number]  Apply topically daily.  Patient not taking: Reported on 06/01/2024   Sherrill Alejandro [place], DO  Active Child           Med Note KARLYNN, Miracle Mongillo M   Tue Sep 14, 2023  2:54 PM) Reports uses as needed  trolamine salicylate (ASPERCREME) 10 % cream [id number]  Apply 1 application  topically 2 (two) times daily as needed for  muscle pain.  Patient not taking: Reported on 06/01/2024   [provider]  Active Child          Recommendation:   PCP Follow-up Continue Current Plan of Care  Follow Up Plan:   Telephone follow up appointment date/time:  07/06/2024 at 1pm   Heddy Shutter, RN, MSN, BSN, CCM [place]  [facility], Population Health Case Manager Phone: [phone]

## 2024-06-06 ENCOUNTER — Other Ambulatory Visit: Payer: Self-pay | Admitting: Internal Medicine

## 2024-06-06 ENCOUNTER — Encounter: Payer: Self-pay | Admitting: Internal Medicine

## 2024-06-06 ENCOUNTER — Ambulatory Visit: Admitting: Internal Medicine

## 2024-06-06 VITALS — BP 138/78 | HR 88 | Temp 98.6°F | Ht 62.0 in | Wt 113.0 lb

## 2024-06-06 DIAGNOSIS — E876 Hypokalemia: Secondary | ICD-10-CM

## 2024-06-06 DIAGNOSIS — I10 Essential (primary) hypertension: Secondary | ICD-10-CM

## 2024-06-06 DIAGNOSIS — J4531 Mild persistent asthma with (acute) exacerbation: Secondary | ICD-10-CM

## 2024-06-06 DIAGNOSIS — D471 Chronic myeloproliferative disease: Secondary | ICD-10-CM

## 2024-06-06 DIAGNOSIS — R269 Unspecified abnormalities of gait and mobility: Secondary | ICD-10-CM

## 2024-06-06 DIAGNOSIS — Z8673 Personal history of transient ischemic attack (TIA), and cerebral infarction without residual deficits: Secondary | ICD-10-CM | POA: Insufficient documentation

## 2024-06-06 DIAGNOSIS — Z981 Arthrodesis status: Secondary | ICD-10-CM

## 2024-06-06 DIAGNOSIS — R634 Abnormal weight loss: Secondary | ICD-10-CM

## 2024-06-06 MED ORDER — DULERA 200-5 MCG/ACT IN AERO: 2.0000 | INHALATION_SPRAY | Freq: Two times a day (BID) | RESPIRATORY_TRACT | 5 refills | Status: DC

## 2024-06-06 NOTE — Assessment & Plan Note (Signed)
 Resolved

## 2024-06-06 NOTE — Assessment & Plan Note (Signed)
 2013 Weakness has resolved

## 2024-06-06 NOTE — Assessment & Plan Note (Signed)
 Due to spinal stenosis - worse 1/19 Using a cane

## 2024-06-06 NOTE — Assessment & Plan Note (Signed)
 Better BP Cont on Diovan  HCT 320/25, Clonidine  twice daily prn. Continue Coreg  25 mg bid, Norvasc , Catapress patch

## 2024-06-06 NOTE — Progress Notes (Unsigned)
 Subjective:  Patient ID: Paula Griffith, female    DOB: 02/16/1946  Age: 78 y.o. MRN: [id number]  CC: Medical Management of Chronic Issues (4 MNTH F/U)   HPI LETIA GUIDRY presents for severe HTN, wt loss, MPN  Outpatient Medications Prior to Visit  Medication Sig Dispense Refill   acetaminophen  (TYLENOL ) 325 MG tablet Take 2 tablets (650 mg total) by mouth every 6 (six) hours as needed for mild pain (or Fever >/= 101). 20 tablet 0   albuterol  (PROAIR  HFA) 108 (90 Base) MCG/ACT inhaler Inhale 2 puffs into the lungs every 4 (four) hours as needed for wheezing. For shortness of breath 8 g 5   anagrelide  (AGRYLIN) 1 MG capsule Take 1 capsule (1 mg total) by mouth See admin instructions. TAKE 1 CAPSULE BY MOUTH DAILY EXCEPT 2 DAYS A WEEK (TUESDAYS AND THURSDAYS) DO NOT TAKE. 120 capsule 1   Ascorbic Acid  (VITAMIN C) 1000 MG tablet Take 1,000 mg by mouth daily.     aspirin  325 MG EC tablet Take 325 mg by mouth daily after breakfast.     Cholecalciferol  (VITAMIN D3) 2000 units TABS Take 2,000 Units by mouth daily.     gabapentin  (NEURONTIN ) 100 MG capsule TAKE 1 CAPSULE BY MOUTH THREE TIMES A DAY AS NEEDED 270 capsule 1   hydrochlorothiazide  (MICROZIDE ) 12.5 MG capsule Take 1 capsule (12.5 mg total) by mouth daily. 90 capsule 3   HYDROcodone -acetaminophen  (NORCO/VICODIN) 5-325 MG tablet 1/2 - 1 tab by mouth up to four times per day as needed for moderate to severe pain 30 tablet 0   Multiple Vitamins-Minerals (ADULT GUMMY PO) Take 2 tablets by mouth daily.     Nutritional Supplements (ENSURE HIGH PROTEIN) LIQD Take 1 Can by mouth daily.     potassium chloride  SA (KLOR-CON  M) 20 MEQ tablet Take 1 tablet (20 mEq total) by mouth daily. 90 tablet 3   losartan  (COZAAR ) 50 MG tablet Take 1 tablet (50 mg total) by mouth daily. 90 tablet 0   Ibuprofen  (ADVIL ) 200 MG CAPS Take 1 capsule by mouth daily as needed (as needed). (Patient not taking: Reported on 06/06/2024)     ondansetron  (ZOFRAN ) 4 MG tablet  Take 1 tablet (4 mg total) by mouth every 6 (six) hours as needed for nausea. (Patient not taking: Reported on 06/06/2024) 20 tablet 0   silver  sulfADIAZINE  (SILVADENE ) 1 % cream Apply topically daily. (Patient not taking: Reported on 06/06/2024) 50 g 0   trolamine salicylate (ASPERCREME) 10 % cream Apply 1 application  topically 2 (two) times daily as needed for muscle pain. (Patient not taking: Reported on 06/06/2024)     fluticasone -salmeterol (WIXELA INHUB) 100-50 MCG/ACT AEPB Inhale 1 puff into the lungs 2 (two) times daily. (Patient not taking: Reported on 06/06/2024) 1 each 5   No facility-administered medications prior to visit.    ROS: Review of Systems  Constitutional:  Positive for fatigue. Negative for activity change, appetite change, chills and unexpected weight change.  HENT:  Negative for congestion, mouth sores and sinus pressure.   Eyes:  Negative for visual disturbance.  Respiratory:  Negative for cough and chest tightness.   Gastrointestinal:  Negative for abdominal pain and nausea.  Genitourinary:  Negative for difficulty urinating, frequency and vaginal pain.  Musculoskeletal:  Positive for arthralgias, back pain and gait problem.  Skin:  Negative for pallor and rash.  Neurological:  Negative for dizziness, tremors, weakness, numbness and headaches.  Psychiatric/Behavioral:  Positive for decreased concentration. Negative for  confusion, sleep disturbance and suicidal ideas. The patient is not nervous/anxious.     Objective:  BP 138/78   Pulse 88   Temp 98.6 F (37 C) (Oral)   Ht 5' 2 (1.575 m)   Wt 113 lb (51.3 kg)   SpO2 99%   BMI 20.67 kg/m   BP Readings from Last 3 Encounters:  06/06/24 138/78  01/20/24 (!) 229/95  12/30/23 (!) 166/90    Wt Readings from Last 3 Encounters:  06/06/24 113 lb (51.3 kg)  01/20/24 110 lb 1.6 oz (49.9 kg)  01/03/24 108 lb (49 kg)    Physical Exam Constitutional:      General: She is not in acute distress.    Appearance:  Normal appearance. She is well-developed.  HENT:     Head: Normocephalic.     Right Ear: External ear normal.     Left Ear: External ear normal.     Nose: Nose normal.  Eyes:     General:        Right eye: No discharge.        Left eye: No discharge.     Conjunctiva/sclera: Conjunctivae normal.     Pupils: Pupils are equal, round, and reactive to light.  Neck:     Thyroid : No thyromegaly.     Vascular: No JVD.     Trachea: No tracheal deviation.  Cardiovascular:     Rate and Rhythm: Normal rate and regular rhythm.     Heart sounds: Normal heart sounds.  Pulmonary:     Effort: No respiratory distress.     Breath sounds: No stridor. No wheezing.  Abdominal:     General: Bowel sounds are normal. There is no distension.     Palpations: Abdomen is soft. There is no mass.     Tenderness: There is no abdominal tenderness. There is no guarding or rebound.  Musculoskeletal:        General: Tenderness present.     Cervical back: Normal range of motion and neck supple. No rigidity.     Right lower leg: No edema.     Left lower leg: No edema.  Lymphadenopathy:     Cervical: No cervical adenopathy.  Skin:    Findings: No erythema or rash.  Neurological:     Mental Status: Mental status is at baseline.     Cranial Nerves: No cranial nerve deficit.     Motor: No abnormal muscle tone.     Coordination: Coordination normal.     Gait: Gait abnormal.     Deep Tendon Reflexes: Reflexes normal.  Psychiatric:        Behavior: Behavior normal.        Thought Content: Thought content normal.        Judgment: Judgment normal.    LS spine w/pain Using a cane  Lab Results  Component Value Date   WBC 5.3 06/06/2024   HGB 9.6 (L) 06/06/2024   HCT 27.9 (L) 06/06/2024   PLT 450.0 (H) 06/06/2024   GLUCOSE 85 06/06/2024   CHOL 138 06/06/2024   TRIG 48.0 06/06/2024   HDL 52.90 06/06/2024   LDLCALC 76 06/06/2024   ALT 5 06/06/2024   AST 20 06/06/2024   NA 129 (L) 06/06/2024   K 4.3  06/06/2024   CL 95 (L) 06/06/2024   CREATININE 0.81 06/06/2024   BUN 13 06/06/2024   CO2 27 06/06/2024   TSH 1.33 06/06/2024   INR 1.0 09/01/2022   HGBA1C 5.4 04/28/2021  DG Chest Port 1 View Result Date: 12/29/2022 CLINICAL DATA:  Chest pain, hypertension EXAM: PORTABLE CHEST 1 VIEW COMPARISON:  12/25/2022 FINDINGS: The heart size and mediastinal contours are within normal limits. Both lungs are clear. The visualized skeletal structures are unremarkable. IMPRESSION: Negative. Electronically Signed   By: Franky Crease M.D.   On: 12/29/2022 17:45    Assessment & Plan:   Problem List Items Addressed This Visit     Asthma   Relevant Orders   Lipid panel (Completed)   Gait disorder   Due to spinal stenosis - worse 1/19 Using a cane      Relevant Orders   CBC with Differential/Platelet (Completed)   Comprehensive metabolic panel with GFR (Completed)   TSH (Completed)   Urinalysis (Completed)   Lipid panel (Completed)   H/O: CVA (cerebrovascular accident)   2013 Weakness has resolved      Relevant Orders   CBC with Differential/Platelet (Completed)   Comprehensive metabolic panel with GFR (Completed)   TSH (Completed)   Urinalysis (Completed)   Lipid panel (Completed)   HTN (hypertension), malignant - Primary   Better BP Cont on Diovan  HCT 320/25, Clonidine  twice daily prn. Continue Coreg  25 mg bid, Norvasc , Catapress patch       Relevant Orders   CBC with Differential/Platelet (Completed)   Comprehensive metabolic panel with GFR (Completed)   TSH (Completed)   Urinalysis (Completed)   Lipid panel (Completed)   Hypokalemia   Check CMET On KCl 10 meq daily       Relevant Orders   CBC with Differential/Platelet (Completed)   Comprehensive metabolic panel with GFR (Completed)   TSH (Completed)   Urinalysis (Completed)   Lipid panel (Completed)   MPN (myeloproliferative neoplasm) (HCC) (Chronic)   Dr Lanny      S/P lumbar spinal fusion   Chronic LBP       Weight loss   Resolved         Meds ordered this encounter  Medications   DISCONTD: mometasone -formoterol  (DULERA ) 200-5 MCG/ACT AERO    Sig: Inhale 2 puffs into the lungs 2 (two) times daily.    Dispense:  1 each    Refill:  5    Wixela - too powdery      Follow-up: Return in about 3 months (around 09/04/2024) for a follow-up visit.  Marolyn Noel, MD

## 2024-06-06 NOTE — Assessment & Plan Note (Signed)
Chronic LBP

## 2024-06-06 NOTE — Assessment & Plan Note (Signed)
Dr Burr Medico

## 2024-06-06 NOTE — Assessment & Plan Note (Signed)
 Check CMET On KCl 10 meq daily

## 2024-06-06 NOTE — Patient Instructions (Signed)

## 2024-06-07 LAB — CBC WITH DIFFERENTIAL/PLATELET
Basophils Absolute: 0.1 K/uL (ref 0.0–0.1)
Basophils Relative: 1.5 % (ref 0.0–3.0)
Eosinophils Absolute: 0.1 K/uL (ref 0.0–0.7)
Eosinophils Relative: 1.9 % (ref 0.0–5.0)
HCT: 27.9 % — ABNORMAL LOW (ref 36.0–46.0)
Hemoglobin: 9.6 g/dL — ABNORMAL LOW (ref 12.0–15.0)
Lymphocytes Relative: 28.3 % (ref 12.0–46.0)
Lymphs Abs: 1.5 K/uL (ref 0.7–4.0)
MCHC: 34.5 g/dL (ref 30.0–36.0)
MCV: 94.1 fl (ref 78.0–100.0)
Monocytes Absolute: 0.6 K/uL (ref 0.1–1.0)
Monocytes Relative: 10.5 % (ref 3.0–12.0)
Neutro Abs: 3.1 K/uL (ref 1.4–7.7)
Neutrophils Relative %: 57.8 % (ref 43.0–77.0)
Platelets: 450 K/uL — ABNORMAL HIGH (ref 150.0–400.0)
RBC: 2.96 Mil/uL — ABNORMAL LOW (ref 3.87–5.11)
RDW: 11.2 % — ABNORMAL LOW (ref 11.5–15.5)
WBC: 5.3 K/uL (ref 4.0–10.5)

## 2024-06-07 LAB — COMPREHENSIVE METABOLIC PANEL WITH GFR
ALT: 5 U/L (ref 0–35)
AST: 20 U/L (ref 0–37)
Albumin: 4.1 g/dL (ref 3.5–5.2)
Alkaline Phosphatase: 61 U/L (ref 39–117)
BUN: 13 mg/dL (ref 6–23)
CO2: 27 meq/L (ref 19–32)
Calcium: 9.6 mg/dL (ref 8.4–10.5)
Chloride: 95 meq/L — ABNORMAL LOW (ref 96–112)
Creatinine, Ser: 0.81 mg/dL (ref 0.40–1.20)
GFR: 69.42 mL/min (ref >60.00)
Glucose, Bld: 85 mg/dL (ref 70–99)
Potassium: 4.3 meq/L (ref 3.5–5.1)
Sodium: 129 meq/L — ABNORMAL LOW (ref 135–145)
Total Bilirubin: 0.5 mg/dL (ref 0.2–1.2)
Total Protein: 7.1 g/dL (ref 6.0–8.3)

## 2024-06-07 LAB — LIPID PANEL
Cholesterol: 138 mg/dL (ref 0–200)
HDL: 52.9 mg/dL (ref >39.00)
LDL Cholesterol: 76 mg/dL (ref 0–99)
NonHDL: 85.54
Total CHOL/HDL Ratio: 3
Triglycerides: 48 mg/dL (ref 0.0–149.0)
VLDL: 9.6 mg/dL (ref 0.0–40.0)

## 2024-06-07 LAB — URINALYSIS
Bilirubin Urine: NEGATIVE
Hgb urine dipstick: NEGATIVE
Ketones, ur: NEGATIVE
Leukocytes,Ua: NEGATIVE
Nitrite: NEGATIVE
Specific Gravity, Urine: 1.02 (ref 1.000–1.030)
Urine Glucose: NEGATIVE
Urobilinogen, UA: 0.2 (ref 0.0–1.0)
pH: 6 (ref 5.0–8.0)

## 2024-06-07 LAB — TSH: TSH: 1.33 u[IU]/mL (ref 0.35–5.50)

## 2024-06-08 ENCOUNTER — Other Ambulatory Visit: Payer: Self-pay | Admitting: Pharmacist

## 2024-06-08 DIAGNOSIS — I10 Essential (primary) hypertension: Secondary | ICD-10-CM

## 2024-06-08 NOTE — Telephone Encounter (Signed)
 PCP appt 12/9 after starting pt on losartan  in September - BP was much improved. K and GFR WNL and stable. Will send losartan  refill.  Darrelyn Drum, PharmD, BCPS, CPP Clinical Pharmacist Practitioner [facility] [phone]

## 2024-06-09 ENCOUNTER — Ambulatory Visit: Payer: Self-pay | Admitting: Internal Medicine

## 2024-07-06 ENCOUNTER — Telehealth: Payer: Self-pay

## 2024-07-06 NOTE — Patient Instructions (Signed)
 [name] - I am sorry I was unable to reach you today for our scheduled appointment. I work with [name], [name], MD and am calling to support your healthcare needs. Please contact me at [phone] at your earliest convenience. I look forward to speaking with you soon.   Thank you,   Heddy Shutter, RN, MSN, BSN, CCM [place]  [facility], Population Health Case Manager Phone: [phone]

## 2024-07-19 ENCOUNTER — Other Ambulatory Visit: Payer: Self-pay

## 2024-07-20 ENCOUNTER — Inpatient Hospital Stay: Admitting: Hematology

## 2024-07-20 ENCOUNTER — Other Ambulatory Visit

## 2024-07-20 ENCOUNTER — Inpatient Hospital Stay: Attending: Hematology

## 2024-07-20 ENCOUNTER — Ambulatory Visit: Admitting: Hematology

## 2024-07-20 VITALS — BP 156/82 | HR 79 | Temp 98.5°F | Resp 16 | Ht 62.0 in | Wt 115.6 lb

## 2024-07-20 DIAGNOSIS — D473 Essential (hemorrhagic) thrombocythemia: Secondary | ICD-10-CM | POA: Diagnosis present

## 2024-07-20 DIAGNOSIS — E871 Hypo-osmolality and hyponatremia: Secondary | ICD-10-CM | POA: Insufficient documentation

## 2024-07-20 DIAGNOSIS — D649 Anemia, unspecified: Secondary | ICD-10-CM

## 2024-07-20 DIAGNOSIS — D471 Chronic myeloproliferative disease: Secondary | ICD-10-CM | POA: Insufficient documentation

## 2024-07-20 DIAGNOSIS — M549 Dorsalgia, unspecified: Secondary | ICD-10-CM | POA: Diagnosis not present

## 2024-07-20 LAB — CBC WITH DIFFERENTIAL (CANCER CENTER ONLY)
Abs Immature Granulocytes: 0 K/uL (ref 0.00–0.07)
Basophils Absolute: 0.1 K/uL (ref 0.0–0.1)
Basophils Relative: 1 %
Eosinophils Absolute: 0.2 K/uL (ref 0.0–0.5)
Eosinophils Relative: 3 %
HCT: 28.9 % — ABNORMAL LOW (ref 36.0–46.0)
Hemoglobin: 10.1 g/dL — ABNORMAL LOW (ref 12.0–15.0)
Immature Granulocytes: 0 %
Lymphocytes Relative: 25 %
Lymphs Abs: 1.2 K/uL (ref 0.7–4.0)
MCH: 31.2 pg (ref 26.0–34.0)
MCHC: 34.9 g/dL (ref 30.0–36.0)
MCV: 89.2 fL (ref 80.0–100.0)
Monocytes Absolute: 0.5 K/uL (ref 0.1–1.0)
Monocytes Relative: 10 %
Neutro Abs: 3 K/uL (ref 1.7–7.7)
Neutrophils Relative %: 61 %
Platelet Count: 267 K/uL (ref 150–400)
RBC: 3.24 MIL/uL — ABNORMAL LOW (ref 3.87–5.11)
RDW: 11.1 % — ABNORMAL LOW (ref 11.5–15.5)
WBC Count: 4.8 K/uL (ref 4.0–10.5)
nRBC: 0 % (ref 0.0–0.2)

## 2024-07-20 LAB — CMP (CANCER CENTER ONLY)
ALT: <5 U/L (ref 0–44)
AST: 20 U/L (ref 15–41)
Albumin: 4.2 g/dL (ref 3.5–5.0)
Alkaline Phosphatase: 81 U/L (ref 38–126)
Anion gap: 12 (ref 5–15)
BUN: 12 mg/dL (ref 8–23)
CO2: 25 mmol/L (ref 22–32)
Calcium: 9.6 mg/dL (ref 8.9–10.3)
Chloride: 93 mmol/L — ABNORMAL LOW (ref 98–111)
Creatinine: 0.89 mg/dL (ref 0.44–1.00)
GFR, Estimated: >60 mL/min
Glucose, Bld: 92 mg/dL (ref 70–99)
Potassium: 4 mmol/L (ref 3.5–5.1)
Sodium: 129 mmol/L — ABNORMAL LOW (ref 135–145)
Total Bilirubin: 0.4 mg/dL (ref 0.0–1.2)
Total Protein: 7.4 g/dL (ref 6.5–8.1)

## 2024-07-20 LAB — IRON AND IRON BINDING CAPACITY (CC-WL,HP ONLY)
Iron: 101 ug/dL (ref 28–170)
Saturation Ratios: 29 % (ref 10.4–31.8)
TIBC: 344 ug/dL (ref 250–450)
UIBC: 243 ug/dL

## 2024-07-20 NOTE — Assessment & Plan Note (Signed)
 essential thrombocythemia, JAK2 mutation negative. -We previously reviewed the natural history of ET, most patients do well, major complication is thrombosis. -She was previously on Hydrea , switched to anagrelide  in 05/2015 due to anemia from Hydrea . -Her anagrelide  dose has been titrated as needed. We will monitor her EKG periodically. She is currently on Anagrelide  1mg  daily except Tuesdays/Thursdays.  -Labs overall stable. Plan to continue Anagrelide  1mg  once daily 5 days a week

## 2024-07-22 NOTE — Progress Notes (Signed)
 " [facility]   [phone] Fax:([phone]   Clinic Follow up Note   Patient Care Team: [name], [name], MD as PCP - General (Internal Medicine) Eliza Lonni RAMAN, MD (Inactive) (Vascular Surgery) Nahser, Aleene PARAS, MD (Inactive) as Consulting Physician (Cardiology) Lanny Callander, MD as Consulting Physician (Hematology) Joshua Alm Hamilton, MD as Consulting Physician (Neurosurgery) [facility] as Consulting Physician (Ophthalmology) Merceda Lela SAUNDERS, RPH-CPP (Pharmacist) Prentiss Heddy HERO, RN as [facility] Management Wallace, Juana M, RN  Date of Service:  07/20/2024  CHIEF COMPLAINT: f/u of essential thrombocythemia  CURRENT THERAPY:  Anagrelide  1 mg daily 5 days a week  Oncology History   MPN (myeloproliferative neoplasm) (HCC) essential thrombocythemia, JAK2 mutation negative. -We previously reviewed the natural history of ET, most patients do well, major complication is thrombosis. -She was previously on Hydrea , switched to anagrelide  in 05/2015 due to anemia from Hydrea . -Her anagrelide  dose has been titrated as needed. We will monitor her EKG periodically. She is currently on Anagrelide  1mg  daily except Tuesdays/Thursdays.  -Labs overall stable. Plan to continue Anagrelide  1mg  once daily 5 days a week  Assessment & Plan Essential thrombocythemia Chronic myeloproliferative neoplasm managed with anagrelide . Platelet count mildly elevated at 450,000/L in December without acute complications. She remains adherent to anagrelide . Mild anemia and persistent hyponatremia present. Reports fatigue and muscle weakness, likely multifactorial. Cognitive changes and poor oral intake discussed, but not clearly attributable to essential thrombocythemia or its therapy. - Reviewed anagrelide  dosing and confirmed adherence. - Discussed recent laboratory results, including platelet count, hemoglobin, and sodium. - Planned to contact  laboratory to follow up on pending blood counts.  ?  Memory loss - Patient's daughter has noticed patient's memory loss, I encouraged her to have screening test at her PCPs office  Chronic hyponatremia - Unclear etiology, I do not think it is related to anagrelide .  I recommended her to follow-up with her PCP, to see if she needs to be referred to nephrology. - I encouraged her to decrease soft drinks, and add salt to her diet  Back pain and deconditioning  -PT referral   Plan - Lab reviewed, will continue anagrelide  - I encouraged her to follow-up with her PCP for memory loss screening, and chronic hyponatremia. - Lab every 3 months, follow-up in 6 months -PT referral    Discussed the use of AI scribe software for clinical note transcription with the patient, who gave verbal consent to proceed.  History of Present Illness Paula Griffith is a 79 year old female with essential thrombocythemia who presents for hematology/oncology follow-up and monitoring of cytopenias and chronic hyponatremia.  She takes anagrelide  one capsule daily except Tuesdays and Thursdays, uses a pill organizer, and has not missed doses. December labs showed platelets 450 x10^3/L and hemoglobin 9.6 g/dL. Today's counts are pending. She denies new bleeding, bruising, or thrombotic symptoms and has no acute ET-related complaints aside from occasional fatigue and muscle weakness.  She uses a wheelchair outside the home and a cane indoors due to inability to stand upright after lumbar spinal fusion with rod placement. She has intermittent back pain managed with a heating pad. She did not participate in postoperative physical therapy. She also had cervical spine surgery with hardware placement.  Her daughter notes progressive short-term memory impairment with frequent repetition and forgetfulness. She lives alone with intermittent family support and has poor oral intake and fatigue. She is oriented to date and current  events but is not personally aware of  memory problems.  She has chronic hyponatremia with persistently low sodium. She drinks water, orange juice, and frequent sodas. She takes hydrochlorothiazide  and potassium supplements.     All other systems were reviewed with the patient and are negative.  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   Spondylolisthesis of lumbar region    Thrombocytopathy [facility]) 2011   Dr Townsend    SURGICAL HISTORY: Past Surgical History:  Procedure Laterality Date   ABDOMINAL HYSTERECTOMY  1989   ANTERIOR CERVICAL DECOMP/DISCECTOMY FUSION N/A 09/22/2017   Procedure: ANTERIOR CERVICAL DECOMPRESSION AND FUSION CERVICAL THREE-FOUR.;  Surgeon: Joshua Alm RAMAN, MD;  Location: [facility] OR;  Service: Neurosurgery;  Laterality: N/A;  anterior   BACK SURGERY     BREAST EXCISIONAL BIOPSY Left    BREAST SURGERY     Left   LUMBAR LAMINECTOMY/DECOMPRESSION MICRODISCECTOMY Left 01/06/2018   Procedure: laminectomy Lumbar five -Sacral one - left, Lumbar two-Lumbar three - bilateral;  Surgeon: Joshua Alm RAMAN, MD;  Location: [facility] OR;  Service: Neurosurgery;  Laterality: Left;    I have reviewed the social history and family history with the patient and they are unchanged from previous note.  ALLERGIES:  is allergic to thiazide-type diuretics, benazepril , amlodipine , fluad [influenza vac a&b surf ant adj], other, codeine, and tramadol .  MEDICATIONS:  Current Outpatient Medications  Medication Sig Dispense Refill   acetaminophen  (TYLENOL ) 325 MG tablet Take 2 tablets  (650 mg total) by mouth every 6 (six) hours as needed for mild pain (or Fever >/= 101). 20 tablet 0   albuterol  (PROAIR  HFA) 108 (90 Base) MCG/ACT inhaler Inhale 2 puffs into the lungs every 4 (four) hours as needed for wheezing. For shortness of breath 8 g 5   anagrelide  (AGRYLIN) 1 MG capsule Take 1 capsule (1 mg total) by mouth See admin instructions. TAKE 1 CAPSULE BY MOUTH DAILY EXCEPT 2 DAYS A WEEK (TUESDAYS AND THURSDAYS) DO NOT TAKE. 120 capsule 1   Ascorbic Acid  (VITAMIN C) 1000 MG tablet Take 1,000 mg by mouth daily.     aspirin  325 MG EC tablet Take 325 mg by mouth daily after breakfast.     Cholecalciferol  (VITAMIN D3) 2000 units TABS Take 2,000 Units by mouth daily.     DULERA  200-5 MCG/ACT AERO INHALE 2 PUFFS INTO THE LUNGS TWICE A DAY 8.8 each 5   gabapentin  (NEURONTIN ) 100 MG capsule TAKE 1 CAPSULE BY MOUTH THREE TIMES A DAY AS NEEDED 270 capsule 1   hydrochlorothiazide  (MICROZIDE ) 12.5 MG capsule Take 1 capsule (12.5 mg total) by mouth daily. 90 capsule 3   HYDROcodone -acetaminophen  (NORCO/VICODIN) 5-325 MG tablet 1/2 - 1 tab by mouth up to four times per day as needed for moderate to severe pain 30 tablet 0   Ibuprofen  (ADVIL ) 200 MG CAPS Take 1 capsule by mouth daily as needed (as needed). (Patient not taking: Reported on 06/06/2024)     losartan  (COZAAR ) 50 MG tablet Take 1 tablet (50 mg total) by mouth  daily. 90 tablet 3   Multiple Vitamins-Minerals (ADULT GUMMY PO) Take 2 tablets by mouth daily.     Nutritional Supplements (ENSURE HIGH PROTEIN) LIQD Take 1 Can by mouth daily.     ondansetron  (ZOFRAN ) 4 MG tablet Take 1 tablet (4 mg total) by mouth every 6 (six) hours as needed for nausea. (Patient not taking: Reported on 06/06/2024) 20 tablet 0   potassium chloride  SA (KLOR-CON  M) 20 MEQ tablet Take 1 tablet (20 mEq total) by mouth daily. 90 tablet 3   silver  sulfADIAZINE  (SILVADENE ) 1 % cream Apply topically daily. (Patient not taking: Reported on 06/06/2024) 50 g 0   trolamine  salicylate (ASPERCREME) 10 % cream Apply 1 application  topically 2 (two) times daily as needed for muscle pain. (Patient not taking: Reported on 06/06/2024)     No current facility-administered medications for this visit.    PHYSICAL EXAMINATION: ECOG PERFORMANCE STATUS: 2 - Symptomatic, <50% confined to bed  Vitals:   07/20/24 1100 07/20/24 1154  BP: (!) 168/82 (!) 156/82  Pulse: 79   Resp: 16   Temp: 98.5 F (36.9 C)   SpO2: 99%    Wt Readings from Last 3 Encounters:  07/20/24 115 lb 9.6 oz (52.4 kg)  06/06/24 113 lb (51.3 kg)  01/20/24 110 lb 1.6 oz (49.9 kg)     GENERAL:alert, no distress and comfortable SKIN: skin color, texture, turgor are normal, no rashes or significant lesions EYES: normal, Conjunctiva are pink and non-injected, sclera clear NECK: supple, thyroid  normal size, non-tender, without nodularity LYMPH:  no palpable lymphadenopathy in the cervical, axillary  LUNGS: clear to auscultation and percussion with normal breathing effort HEART: regular rate & rhythm and no murmurs and no lower extremity edema ABDOMEN:abdomen soft, non-tender and normal bowel sounds Musculoskeletal:no cyanosis of digits and no clubbing  NEURO: alert & oriented x 3 with fluent speech, no focal motor/sensory deficits  Physical Exam   LABORATORY DATA:  I have reviewed the data as listed    Latest Ref Rng & Units 07/20/2024   11:20 AM 06/06/2024    4:31 PM 01/20/2024    2:58 PM  CBC  WBC 4.0 - 10.5 K/uL 4.8  5.3  5.7   Hemoglobin 12.0 - 15.0 g/dL 89.8  9.6  89.7   Hematocrit 36.0 - 46.0 % 28.9  27.9  28.9   Platelets 150 - 400 K/uL 267  450.0  280         Latest Ref Rng & Units 07/20/2024   11:20 AM 06/06/2024    4:31 PM 01/20/2024    2:58 PM  CMP  Glucose 70 - 99 mg/dL 92  85  80   BUN 8 - 23 mg/dL 12  13  17    Creatinine 0.44 - 1.00 mg/dL 9.10  9.18  9.03   Sodium 135 - 145 mmol/L 129  129  130   Potassium 3.5 - 5.1 mmol/L 4.0  4.3  4.5   Chloride 98 - 111 mmol/L 93   95  97   CO2 22 - 32 mmol/L 25  27  26    Calcium 8.9 - 10.3 mg/dL 9.6  9.6  9.8   Total Protein 6.5 - 8.1 g/dL 7.4  7.1  7.3   Total Bilirubin 0.0 - 1.2 mg/dL 0.4  0.5  0.5   Alkaline Phos 38 - 126 U/L 81  61  55   AST 15 - 41 U/L 20  20  17    ALT 0 - 44  U/L 5  5  5        RADIOGRAPHIC STUDIES: I have personally reviewed the radiological images as listed and agreed with the findings in the report. No results found.    Orders Placed This Encounter  Procedures   Ambulatory referral to Physical Therapy    Referral Priority:   Routine    Referral Type:   Physical Medicine    Referral Reason:   Specialty Services Required    Requested Specialty:   Physical Therapy    Number of Visits Requested:   1   All questions were answered. The patient knows to call the clinic with any problems, questions or concerns. No barriers to learning was detected. The total time spent in the appointment was 25 minutes, including review of chart and various tests results, discussions about plan of care and coordination of care plan     Onita Mattock, MD 07/20/2024    "

## 2024-08-08 ENCOUNTER — Telehealth

## 2024-08-14 ENCOUNTER — Ambulatory Visit

## 2024-10-18 ENCOUNTER — Inpatient Hospital Stay

## 2025-01-03 ENCOUNTER — Ambulatory Visit

## 2025-01-22 ENCOUNTER — Inpatient Hospital Stay

## 2025-01-22 ENCOUNTER — Inpatient Hospital Stay: Admitting: Hematology
